# Patient Record
Sex: Male | Born: 1950
Health system: Southern US, Community
[De-identification: ages and names within clinical notes are randomized; demographics above are authoritative.]

## PROBLEM LIST (undated history)

## (undated) DIAGNOSIS — I6930 Unspecified sequelae of cerebral infarction: Secondary | ICD-10-CM

## (undated) DIAGNOSIS — E785 Hyperlipidemia, unspecified: Secondary | ICD-10-CM

## (undated) DIAGNOSIS — Z558 Other problems related to education and literacy: Secondary | ICD-10-CM

## (undated) DIAGNOSIS — R339 Retention of urine, unspecified: Secondary | ICD-10-CM

## (undated) DIAGNOSIS — I421 Obstructive hypertrophic cardiomyopathy: Secondary | ICD-10-CM

## (undated) DIAGNOSIS — I5030 Unspecified diastolic (congestive) heart failure: Secondary | ICD-10-CM

## (undated) DIAGNOSIS — K5790 Diverticulosis of intestine, part unspecified, without perforation or abscess without bleeding: Secondary | ICD-10-CM

## (undated) DIAGNOSIS — I10 Essential (primary) hypertension: Secondary | ICD-10-CM

## (undated) DIAGNOSIS — Z923 Personal history of irradiation: Secondary | ICD-10-CM

## (undated) DIAGNOSIS — I119 Hypertensive heart disease without heart failure: Secondary | ICD-10-CM

## (undated) DIAGNOSIS — N182 Chronic kidney disease, stage 2 (mild): Secondary | ICD-10-CM

## (undated) DIAGNOSIS — Z7901 Long term (current) use of anticoagulants: Secondary | ICD-10-CM

## (undated) DIAGNOSIS — N400 Enlarged prostate without lower urinary tract symptoms: Secondary | ICD-10-CM

## (undated) DIAGNOSIS — I639 Cerebral infarction, unspecified: Secondary | ICD-10-CM

## (undated) DIAGNOSIS — I517 Cardiomegaly: Secondary | ICD-10-CM

## (undated) DIAGNOSIS — C61 Malignant neoplasm of prostate: Secondary | ICD-10-CM

## (undated) DIAGNOSIS — I340 Nonrheumatic mitral (valve) insufficiency: Secondary | ICD-10-CM

## (undated) DIAGNOSIS — D494 Neoplasm of unspecified behavior of bladder: Secondary | ICD-10-CM

## (undated) DIAGNOSIS — R972 Elevated prostate specific antigen [PSA]: Secondary | ICD-10-CM

## (undated) DIAGNOSIS — R9431 Abnormal electrocardiogram [ECG] [EKG]: Secondary | ICD-10-CM

## (undated) HISTORY — DX: Elevated prostate specific antigen (PSA): R97.20

## (undated) HISTORY — DX: Diverticulosis of intestine, part unspecified, without perforation or abscess without bleeding: K57.90

## (undated) HISTORY — DX: Hyperlipidemia, unspecified: E78.5

## (undated) HISTORY — DX: Essential (primary) hypertension: I10

## (undated) HISTORY — PX: CARDIOVASCULAR STRESS TEST: SHX262

## (undated) HISTORY — DX: Cardiomegaly: I51.7

## (undated) HISTORY — DX: Nonrheumatic mitral (valve) insufficiency: I34.0

## (undated) HISTORY — PX: OTHER SURGICAL HISTORY: SHX169

## (undated) HISTORY — DX: Cerebral infarction, unspecified: I63.9

## (undated) HISTORY — PX: TRANSTHORACIC ECHOCARDIOGRAM: SHX275

---
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
## 1997-12-19 ENCOUNTER — Encounter: Admission: RE | Admit: 1997-12-19 | Discharge: 1997-12-19 | Payer: Self-pay | Admitting: Hematology and Oncology

## 1998-12-03 ENCOUNTER — Ambulatory Visit (HOSPITAL_COMMUNITY): Admission: RE | Admit: 1998-12-03 | Discharge: 1998-12-03 | Payer: Self-pay | Admitting: Internal Medicine

## 1998-12-03 ENCOUNTER — Encounter: Payer: Self-pay | Admitting: Internal Medicine

## 1998-12-03 ENCOUNTER — Encounter: Admission: RE | Admit: 1998-12-03 | Discharge: 1998-12-03 | Payer: Self-pay | Admitting: Internal Medicine

## 1999-01-10 ENCOUNTER — Encounter: Admission: RE | Admit: 1999-01-10 | Discharge: 1999-01-10 | Payer: Self-pay | Admitting: Hematology and Oncology

## 1999-07-15 ENCOUNTER — Encounter: Admission: RE | Admit: 1999-07-15 | Discharge: 1999-07-15 | Payer: Self-pay | Admitting: Internal Medicine

## 2000-01-28 ENCOUNTER — Encounter: Admission: RE | Admit: 2000-01-28 | Discharge: 2000-01-28 | Payer: Self-pay | Admitting: Internal Medicine

## 2000-02-18 ENCOUNTER — Encounter: Admission: RE | Admit: 2000-02-18 | Discharge: 2000-02-18 | Payer: Self-pay | Admitting: Internal Medicine

## 2000-06-16 ENCOUNTER — Encounter: Admission: RE | Admit: 2000-06-16 | Discharge: 2000-06-16 | Payer: Self-pay | Admitting: Internal Medicine

## 2000-07-31 ENCOUNTER — Encounter: Admission: RE | Admit: 2000-07-31 | Discharge: 2000-07-31 | Payer: Self-pay

## 2000-08-19 ENCOUNTER — Ambulatory Visit (HOSPITAL_COMMUNITY): Admission: RE | Admit: 2000-08-19 | Discharge: 2000-08-19 | Payer: Self-pay

## 2001-03-04 ENCOUNTER — Encounter: Admission: RE | Admit: 2001-03-04 | Discharge: 2001-03-04 | Payer: Self-pay

## 2002-06-22 ENCOUNTER — Encounter: Admission: RE | Admit: 2002-06-22 | Discharge: 2002-06-22 | Payer: Self-pay | Admitting: Internal Medicine

## 2002-07-06 ENCOUNTER — Ambulatory Visit (HOSPITAL_COMMUNITY): Admission: RE | Admit: 2002-07-06 | Discharge: 2002-07-06 | Payer: Self-pay | Admitting: Internal Medicine

## 2002-07-06 ENCOUNTER — Encounter: Payer: Self-pay | Admitting: Cardiology

## 2003-07-05 ENCOUNTER — Encounter: Admission: RE | Admit: 2003-07-05 | Discharge: 2003-07-05 | Payer: Self-pay | Admitting: Internal Medicine

## 2004-10-21 ENCOUNTER — Ambulatory Visit: Payer: Self-pay | Admitting: Internal Medicine

## 2004-11-14 ENCOUNTER — Encounter (INDEPENDENT_AMBULATORY_CARE_PROVIDER_SITE_OTHER): Payer: Self-pay | Admitting: Cardiology

## 2004-11-14 ENCOUNTER — Ambulatory Visit (HOSPITAL_COMMUNITY): Admission: RE | Admit: 2004-11-14 | Discharge: 2004-11-14 | Payer: Self-pay

## 2005-11-21 ENCOUNTER — Ambulatory Visit: Payer: Self-pay | Admitting: Internal Medicine

## 2005-11-26 ENCOUNTER — Ambulatory Visit: Payer: Self-pay | Admitting: Internal Medicine

## 2005-11-27 ENCOUNTER — Encounter (INDEPENDENT_AMBULATORY_CARE_PROVIDER_SITE_OTHER): Payer: Self-pay | Admitting: Internal Medicine

## 2005-11-27 LAB — CONVERTED CEMR LAB: PSA: 5.98 ng/mL

## 2005-12-04 ENCOUNTER — Encounter (INDEPENDENT_AMBULATORY_CARE_PROVIDER_SITE_OTHER): Payer: Self-pay | Admitting: *Deleted

## 2005-12-04 ENCOUNTER — Ambulatory Visit (HOSPITAL_COMMUNITY): Admission: RE | Admit: 2005-12-04 | Discharge: 2005-12-04 | Payer: Self-pay | Admitting: Internal Medicine

## 2005-12-09 ENCOUNTER — Ambulatory Visit: Payer: Self-pay | Admitting: Gastroenterology

## 2005-12-18 DIAGNOSIS — K5730 Diverticulosis of large intestine without perforation or abscess without bleeding: Secondary | ICD-10-CM | POA: Insufficient documentation

## 2006-01-02 ENCOUNTER — Ambulatory Visit: Payer: Self-pay | Admitting: Internal Medicine

## 2006-01-16 ENCOUNTER — Ambulatory Visit: Payer: Self-pay | Admitting: Gastroenterology

## 2006-01-16 ENCOUNTER — Encounter (INDEPENDENT_AMBULATORY_CARE_PROVIDER_SITE_OTHER): Payer: Self-pay | Admitting: *Deleted

## 2006-02-05 ENCOUNTER — Ambulatory Visit: Payer: Self-pay | Admitting: Internal Medicine

## 2006-04-30 ENCOUNTER — Encounter (INDEPENDENT_AMBULATORY_CARE_PROVIDER_SITE_OTHER): Payer: Self-pay | Admitting: Internal Medicine

## 2006-04-30 DIAGNOSIS — F17200 Nicotine dependence, unspecified, uncomplicated: Secondary | ICD-10-CM

## 2006-04-30 DIAGNOSIS — I10 Essential (primary) hypertension: Secondary | ICD-10-CM | POA: Insufficient documentation

## 2006-04-30 DIAGNOSIS — R972 Elevated prostate specific antigen [PSA]: Secondary | ICD-10-CM | POA: Insufficient documentation

## 2006-04-30 DIAGNOSIS — E785 Hyperlipidemia, unspecified: Secondary | ICD-10-CM | POA: Insufficient documentation

## 2006-06-11 ENCOUNTER — Telehealth: Payer: Self-pay | Admitting: *Deleted

## 2006-09-21 ENCOUNTER — Telehealth (INDEPENDENT_AMBULATORY_CARE_PROVIDER_SITE_OTHER): Payer: Self-pay | Admitting: *Deleted

## 2006-12-15 ENCOUNTER — Telehealth (INDEPENDENT_AMBULATORY_CARE_PROVIDER_SITE_OTHER): Payer: Self-pay | Admitting: *Deleted

## 2006-12-22 ENCOUNTER — Ambulatory Visit: Payer: Self-pay | Admitting: *Deleted

## 2006-12-22 ENCOUNTER — Encounter (INDEPENDENT_AMBULATORY_CARE_PROVIDER_SITE_OTHER): Payer: Self-pay | Admitting: Internal Medicine

## 2006-12-22 LAB — CONVERTED CEMR LAB
Cholesterol, target level: 200 mg/dL
HDL goal, serum: 40 mg/dL
LDL Goal: 100 mg/dL

## 2006-12-23 LAB — CONVERTED CEMR LAB
ALT: 17 units/L (ref 0–53)
AST: 25 units/L (ref 0–37)
Albumin: 4.9 g/dL (ref 3.5–5.2)
Alkaline Phosphatase: 125 units/L — ABNORMAL HIGH (ref 39–117)
BUN: 23 mg/dL (ref 6–23)
CO2: 26 meq/L (ref 19–32)
Calcium: 10 mg/dL (ref 8.4–10.5)
Chloride: 99 meq/L (ref 96–112)
Creatinine, Ser: 1.55 mg/dL — ABNORMAL HIGH (ref 0.40–1.50)
Glucose, Bld: 98 mg/dL (ref 70–99)
PSA: 8.45 ng/mL — ABNORMAL HIGH (ref 0.10–4.00)
Potassium: 4.4 meq/L (ref 3.5–5.3)
Sodium: 139 meq/L (ref 135–145)
Total Bilirubin: 0.9 mg/dL (ref 0.3–1.2)
Total Protein: 8.3 g/dL (ref 6.0–8.3)

## 2006-12-28 ENCOUNTER — Ambulatory Visit: Payer: Self-pay | Admitting: Internal Medicine

## 2006-12-28 ENCOUNTER — Encounter (INDEPENDENT_AMBULATORY_CARE_PROVIDER_SITE_OTHER): Payer: Self-pay | Admitting: Internal Medicine

## 2006-12-28 LAB — CONVERTED CEMR LAB
Cholesterol: 181 mg/dL (ref 0–200)
HDL: 64 mg/dL (ref >39)
LDL Cholesterol: 103 mg/dL — ABNORMAL HIGH (ref 0–99)
Total CHOL/HDL Ratio: 2.8
Triglycerides: 68 mg/dL (ref <150)
VLDL: 14 mg/dL (ref 0–40)

## 2008-03-15 ENCOUNTER — Telehealth: Payer: Self-pay | Admitting: *Deleted

## 2008-06-28 ENCOUNTER — Encounter (INDEPENDENT_AMBULATORY_CARE_PROVIDER_SITE_OTHER): Payer: Self-pay | Admitting: Internal Medicine

## 2008-06-28 ENCOUNTER — Ambulatory Visit: Payer: Self-pay | Admitting: Internal Medicine

## 2008-06-28 LAB — CONVERTED CEMR LAB
BUN: 17 mg/dL (ref 6–23)
CO2: 27 meq/L (ref 19–32)
Calcium: 9.5 mg/dL (ref 8.4–10.5)
Chloride: 107 meq/L (ref 96–112)
Creatinine, Ser: 1.28 mg/dL (ref 0.40–1.50)
Glucose, Bld: 69 mg/dL — ABNORMAL LOW (ref 70–99)
Potassium: 4.1 meq/L (ref 3.5–5.3)
Sodium: 145 meq/L (ref 135–145)

## 2008-07-03 ENCOUNTER — Encounter (INDEPENDENT_AMBULATORY_CARE_PROVIDER_SITE_OTHER): Payer: Self-pay | Admitting: Internal Medicine

## 2008-07-03 ENCOUNTER — Ambulatory Visit: Payer: Self-pay | Admitting: Internal Medicine

## 2008-07-04 LAB — CONVERTED CEMR LAB
Cholesterol: 220 mg/dL — ABNORMAL HIGH (ref 0–200)
HDL: 58 mg/dL (ref >39)
LDL Cholesterol: 149 mg/dL — ABNORMAL HIGH (ref 0–99)
Total CHOL/HDL Ratio: 3.8
Triglycerides: 65 mg/dL (ref <150)
VLDL: 13 mg/dL (ref 0–40)

## 2009-01-29 ENCOUNTER — Telehealth (INDEPENDENT_AMBULATORY_CARE_PROVIDER_SITE_OTHER): Payer: Self-pay | Admitting: Internal Medicine

## 2009-09-03 ENCOUNTER — Ambulatory Visit: Payer: Self-pay | Admitting: Internal Medicine

## 2009-10-04 ENCOUNTER — Ambulatory Visit: Payer: Self-pay | Admitting: Internal Medicine

## 2009-10-05 LAB — CONVERTED CEMR LAB
ALT: 13 units/L (ref 0–53)
AST: 21 units/L (ref 0–37)
Albumin: 4.8 g/dL (ref 3.5–5.2)
Alkaline Phosphatase: 112 units/L (ref 39–117)
BUN: 21 mg/dL (ref 6–23)
CO2: 25 meq/L (ref 19–32)
Calcium: 10.4 mg/dL (ref 8.4–10.5)
Chloride: 102 meq/L (ref 96–112)
Cholesterol: 220 mg/dL — ABNORMAL HIGH (ref 0–200)
Creatinine, Ser: 1.38 mg/dL (ref 0.40–1.50)
Glucose, Bld: 88 mg/dL (ref 70–99)
HDL: 74 mg/dL (ref >39)
LDL Cholesterol: 132 mg/dL — ABNORMAL HIGH (ref 0–99)
PSA: 9.74 ng/mL — ABNORMAL HIGH (ref 0.10–4.00)
Potassium: 4.6 meq/L (ref 3.5–5.3)
Sodium: 141 meq/L (ref 135–145)
Total Bilirubin: 0.7 mg/dL (ref 0.3–1.2)
Total CHOL/HDL Ratio: 3
Total Protein: 7.9 g/dL (ref 6.0–8.3)
Triglycerides: 68 mg/dL (ref <150)
VLDL: 14 mg/dL (ref 0–40)

## 2009-10-09 ENCOUNTER — Encounter (INDEPENDENT_AMBULATORY_CARE_PROVIDER_SITE_OTHER): Payer: Self-pay | Admitting: Internal Medicine

## 2009-10-09 ENCOUNTER — Telehealth: Payer: Self-pay | Admitting: *Deleted

## 2009-10-09 ENCOUNTER — Ambulatory Visit (HOSPITAL_COMMUNITY): Admission: RE | Admit: 2009-10-09 | Discharge: 2009-10-09 | Payer: Self-pay | Admitting: Internal Medicine

## 2009-10-10 DIAGNOSIS — I421 Obstructive hypertrophic cardiomyopathy: Secondary | ICD-10-CM

## 2009-10-23 ENCOUNTER — Ambulatory Visit: Payer: Self-pay | Admitting: Internal Medicine

## 2009-11-14 ENCOUNTER — Encounter: Payer: Self-pay | Admitting: Ophthalmology

## 2009-11-15 ENCOUNTER — Encounter: Payer: Self-pay | Admitting: Cardiology

## 2009-11-19 ENCOUNTER — Ambulatory Visit: Payer: Self-pay | Admitting: Cardiology

## 2009-11-19 DIAGNOSIS — R0989 Other specified symptoms and signs involving the circulatory and respiratory systems: Secondary | ICD-10-CM

## 2009-11-26 ENCOUNTER — Encounter: Payer: Self-pay | Admitting: Ophthalmology

## 2009-12-14 ENCOUNTER — Encounter: Payer: Self-pay | Admitting: Cardiology

## 2009-12-14 ENCOUNTER — Ambulatory Visit: Payer: Self-pay | Admitting: Cardiology

## 2009-12-14 ENCOUNTER — Ambulatory Visit: Payer: Self-pay

## 2009-12-14 ENCOUNTER — Encounter: Payer: Self-pay | Admitting: Cardiovascular Disease

## 2009-12-14 ENCOUNTER — Ambulatory Visit (HOSPITAL_COMMUNITY): Admission: RE | Admit: 2009-12-14 | Discharge: 2009-12-14 | Payer: Self-pay | Admitting: Cardiology

## 2010-01-11 ENCOUNTER — Ambulatory Visit: Payer: Self-pay | Admitting: Cardiology

## 2010-03-25 ENCOUNTER — Ambulatory Visit: Payer: Self-pay | Admitting: Internal Medicine

## 2010-06-11 NOTE — Assessment & Plan Note (Signed)
Summary: EST-1 MONTH F/U VISIT AND LABS/CH   Vital Signs:  Patient profile:   60 year old male Height:      73 inches (185.42 cm) Weight:      118 pounds (54.10 kg) BMI:     15.62 Temp:     97.0 degrees F (36.11 degrees C) oral Pulse rate:   93 / minute BP sitting:   114 / 77  (left arm) Cuff size:   regular  Vitals Entered By: Theotis Barrio NT II (Oct 04, 2009 8:44 AM) CC: ROUTINE OFFICE VISIT / LAB   /  MEDICATION REFILL Is Patient Diabetic? No Pain Assessment Patient in pain? no      Nutritional Status BMI of < 19 = underweight  Have you ever been in a relationship where you felt threatened, hurt or afraid?No   Does patient need assistance? Functional Status Self care Ambulation Normal Comments ROUTINE OFFICE VISIT  / LABS / MEDICATION REFILL   CC:  ROUTINE OFFICE VISIT / LAB   /  MEDICATION REFILL.  History of Present Illness: Ian Hill is a 60 year old Male with PMH/problems as outlined in the EMR, who presents to the [facility] with chief complaint(s) of:    1. HtN: taking his meds as prescribed no new complaints.  2. HL: FLP today. 3. Heart murmur: known to have MR, needs repeat echo for evaluation of progression.  4. Elevated PSA: noted to have elevated PSA on last exam, he was supposed to go see a urologist but never did.   Preventive Screening-Counseling & Management  Alcohol-Tobacco     Smoking Status: current     Smoking Cessation Counseling: yes     Packs/Day: <0.25     Year Started:  ?  Caffeine-Diet-Exercise     Does Patient Exercise: yes     Type of exercise: WALKING     Times/week: 7  Current Medications (verified): 1)  Procardia Xl 90 Mg Tb24 (Nifedipine) .... Take 1 Tablet By Mouth Once A Day 2)  Hydrochlorothiazide 25 Mg Tabs (Hydrochlorothiazide) .... Take 1 Tablet By Mouth Once A Day 3)  Zocor 40 Mg Tabs (Simvastatin) .... Take 1 Tablet By Mouth Once A Day in The Evening. 4)  Anacin 81 Mg  Tbec (Aspirin) .... Take 1 Tablet By Mouth Once  A Day  Allergies (verified): No Known Drug Allergies  Past History:  Past Medical History: Last updated: 04/30/2006 Mitral valve regurgitation Elevated PSA Hyperlipidemia Hypertension Hematochezia Polyuria, polydipsia, noturia Hx of LUL aspergilloma Tobacco abuse, hx of  Risk Factors: Exercise: yes (10/04/2009)  Risk Factors: Smoking Status: current (10/04/2009) Packs/Day: <0.25 (10/04/2009)  Review of Systems       Review of System: Negative except per HPI.    Physical Exam  General:  alert and underweight appearing.   Head:  normocephalic and atraumatic.   Mouth:  pharynx pink and moist.   Neck:  supple.   Lungs:  normal respiratory effort and normal breath sounds.   Heart:  normal rate and regular rhythm.   Abdomen:  soft and non-tender.   Pulses:  normal peripheral pulses  Extremities:  no cyanosis, clubbing or edema  Neurologic:  non focal.  Psych:  Oriented X3 and normally interactive.     Impression & Recommendations:  Problem # 1:  HYPERTENSION (ICD-401.9) Well-controlled. Continue the current regimen.   His updated medication list for this problem includes:    Procardia Xl 90 Mg Tb24 (Nifedipine) .Marland Kitchen... Take 1 tablet by mouth once  a day    Hydrochlorothiazide 25 Mg Tabs (Hydrochlorothiazide) .Marland Kitchen... Take 1 tablet by mouth once a day  Orders: T-Comprehensive Metabolic Panel [id number]) T-Lipid Profile ([id number])  Problem # 2:  DYSLIPIDEMIA (ICD-272.4) FLP today.  His updated medication list for this problem includes:    Zocor 40 Mg Tabs (Simvastatin) .Marland Kitchen... Take 1 tablet by mouth once a day in the evening.  Orders: T-Comprehensive Metabolic Panel [id number]) T-Lipid Profile ([id number])  Problem # 3:  MITRAL REGURGITATION (ICD-424.0) Will evaluate progression with an echo exam.   His updated medication list for this problem includes:    Anacin 81 Mg Tbec (Aspirin) .Marland Kitchen... Take 1 tablet by mouth once a day  Orders: 2 D Echo (2 D  Echo)  Problem # 4:  ELEVATED PROSTATE SPECIFIC ANTIGEN (ICD-790.93) Will get repeat PSA and consider referral to urologist.   Complete Medication List: 1)  Procardia Xl 90 Mg Tb24 (Nifedipine) .... Take 1 tablet by mouth once a day 2)  Hydrochlorothiazide 25 Mg Tabs (Hydrochlorothiazide) .... Take 1 tablet by mouth once a day 3)  Zocor 40 Mg Tabs (Simvastatin) .... Take 1 tablet by mouth once a day in the evening. 4)  Anacin 81 Mg Tbec (Aspirin) .... Take 1 tablet by mouth once a day  Other Orders: T-PSA ([id number])  Patient Instructions: 1)  Please schedule a follow-up appointment in 6 months. 2)  Please come for ultrasound of your heart. 3)  We will let you know if anything wrong with your lab work.   Process Orders Check Orders Results:     [facility]: Order checked:     [id number] -- T-PSA -- ABN required due to diagnosis (CPT: [phone]) Tests Sent for requisitioning (Oct 04, 2009 9:06 AM):     10/04/2009: [facility] -- T-Comprehensive Metabolic Panel [[id number]] (signed)     10/04/2009: [facility] -- T-Lipid Profile [phone] (signed)     10/04/2009: [facility] -- T-PSA [phone] (signed)    Prevention & Chronic Care Immunizations   Influenza vaccine: Fluvax MCR  (06/28/2008)   Influenza vaccine deferral: Deferred  (10/04/2009)    Tetanus booster: Not documented   Td booster deferral: Deferred  (09/03/2009)    Pneumococcal vaccine: Not documented    H. zoster vaccine: Not documented   H. zoster vaccine deferral: Deferred  (09/03/2009)  Colorectal Screening   Hemoccult: Not documented    Colonoscopy: Abnormal  (01/16/2006)   Colonoscopy action/deferral: Repeat colonoscopy in 5 years.   (11/15/2005)  Other Screening   PSA: 8.45  (12/22/2006)   PSA ordered.   Smoking status: current  (10/04/2009)   Smoking cessation counseling: yes  (10/04/2009)  Lipids   Total Cholesterol: 220   (07/03/2008)   LDL: 149  (07/03/2008)   LDL Direct: Not documented   HDL: 58  (07/03/2008)   Triglycerides: 65  (07/03/2008)    SGOT (AST): 25  (12/22/2006)   BMP action: Ordered   SGPT (ALT): 17  (12/22/2006) CMP ordered    Alkaline phosphatase: 125  (12/22/2006)   Total bilirubin: 0.9  (12/22/2006)    Lipid flowsheet reviewed?: Yes   Progress toward LDL goal: Unchanged  Hypertension   Last Blood Pressure: 114 / 77  (10/04/2009)   Serum creatinine: 1.28  (06/28/2008)   BMP action: Ordered   Serum potassium 4.1  (06/28/2008) CMP ordered     Hypertension flowsheet reviewed?: Yes   Progress toward BP goal: At goal  Self-Management Support :   Personal Goals (by the  next clinic visit) :      Personal blood pressure goal: 140/90  (10/04/2009)     Personal LDL goal: 100  (10/04/2009)    Patient will work on the following items until the next clinic visit to reach self-care goals:     Medications and monitoring: take my medicines every day, bring all of my medications to every visit  (09/03/2009)     Eating: drink diet soda or water instead of juice or soda, eat more vegetables, use fresh or frozen vegetables, eat foods that are low in salt, eat baked foods instead of fried foods, eat fruit for snacks and desserts, limit or avoid alcohol  (10/04/2009)     Activity: take a 30 minute walk every day  (10/04/2009)    Hypertension self-management support: Resources for patients handout  (10/04/2009)    Lipid self-management support: Resources for patients handout  (10/04/2009)        Resource handout printed.  Appended Document: Orders Update    Clinical Lists Changes  Orders: Added new Test order of T-PSA [phone]) - Signed      Process Orders Check Orders Results:     [facility]: Check successful Tests Sent for requisitioning (Oct 05, 2009 10:43 AM):     10/04/2009: [facility] -- T-PSA [phone] (signed)

## 2010-06-11 NOTE — Consult Note (Signed)
Summary: [facility]  [facility]   Imported By: Louretta Parma 12/06/2009 16:03:06  _____________________________________________________________________  External Attachment:    Type:   Image     Comment:   External Document

## 2010-06-11 NOTE — Assessment & Plan Note (Signed)
Summary: EST-CK./FU/MEDS/CFB   Vital Signs:  Patient profile:   60 year old male Height:      73 inches (185.42 cm) Weight:      119.03 pounds (54.10 kg) BMI:     15.76 Temp:     98 degrees F (36.67 degrees C) oral Pulse rate:   111 / minute BP sitting:   148 / 90  (left arm)  Vitals Entered By: Angelina Ok RN (September 03, 2009 1:34 PM) CC: Depression Is Patient Diabetic? No Pain Assessment Patient in pain? no      Nutritional Status BMI of < 19 = underweight  Have you ever been in a relationship where you felt threatened, hurt or afraid?No   Does patient need assistance? Functional Status Self care Ambulation Normal Comments Needs refills.  Check up.   CC:  Depression.  History of Present Illness: Ian Hill is a 60 year old Male with PMH/problems as outlined in the EMR, who presents to the [facility] for routine follow up. [name] doing his meds as prescribed and doesn't have any new complaints today.    Depression History:      The patient denies a depressed mood most of the day and a diminished interest in his usual daily activities.         Preventive Screening-Counseling & Management  Alcohol-Tobacco     Smoking Status: current     Smoking Cessation Counseling: yes     Packs/Day: <0.25     Year Started:  ?  Comments: Smokes 1/2 pack per week.  Current Medications (verified): 1)  Procardia Xl 90 Mg Tb24 (Nifedipine) .... Take 1 Tablet By Mouth Once A Day 2)  Hydrochlorothiazide 25 Mg Tabs (Hydrochlorothiazide) .... Take 1 Tablet By Mouth Once A Day 3)  Zocor 40 Mg Tabs (Simvastatin) .... Take 1 Tablet By Mouth Once A Day in The Evening. 4)  Anacin 81 Mg  Tbec (Aspirin) .... Take 1 Tablet By Mouth Once A Day  Allergies (verified): No Known Drug Allergies  Past History:  Past Medical History: Last updated: 04/30/2006 Mitral valve regurgitation Elevated PSA Hyperlipidemia Hypertension Hematochezia Polyuria, polydipsia, noturia Hx of LUL  aspergilloma Tobacco abuse, hx of  Risk Factors: Exercise: yes (12/22/2006)  Risk Factors: Smoking Status: current (09/03/2009) Packs/Day: <0.25 (09/03/2009)  Social History: Packs/Day:  <0.25  Review of Systems       as per hPI  Physical Exam  General:  alert and well-developed.   Lungs:  normal respiratory effort and normal breath sounds.   Heart:  normal rate and regular rhythm.   Abdomen:  soft and non-tender.   Pulses:  normal peripheral pulses  Extremities:  no cyanosis, clubbing or edema  Neurologic:  non focal   Impression & Recommendations:  Problem # 1:  HYPERTENSION (ICD-401.9) Borderline control. I will review him one month if persistently high will make changes to his regimen, eg. add ACEi or beta blocker.  His updated medication list for this problem includes:    Procardia Xl 90 Mg Tb24 (Nifedipine) .Marland Kitchen... Take 1 tablet by mouth once a day    Hydrochlorothiazide 25 Mg Tabs (Hydrochlorothiazide) .Marland Kitchen... Take 1 tablet by mouth once a day  Problem # 2:  DYSLIPIDEMIA (ICD-272.4) Increased zocor last time. Will recheck FLP and review.   His updated medication list for this problem includes:    Zocor 40 Mg Tabs (Simvastatin) .Marland Kitchen... Take 1 tablet by mouth once a day in the evening.  Future Orders: T-Lipid Profile [phone]) .Marland KitchenMarland Kitchen  09/07/2009  Complete Medication List: 1)  Procardia Xl 90 Mg Tb24 (Nifedipine) .... Take 1 tablet by mouth once a day 2)  Hydrochlorothiazide 25 Mg Tabs (Hydrochlorothiazide) .... Take 1 tablet by mouth once a day 3)  Zocor 40 Mg Tabs (Simvastatin) .... Take 1 tablet by mouth once a day in the evening. 4)  Anacin 81 Mg Tbec (Aspirin) .... Take 1 tablet by mouth once a day  Other Orders: Future Orders: T-Comprehensive Metabolic Panel ([id number]) ... 09/07/2009  Patient Instructions: 1)  Please set up a lab appointment for fasting blood work. 2)  Please schedule a follow-up appointment in 1 month.  Prevention & Chronic  Care Immunizations   Influenza vaccine: Fluvax MCR  (06/28/2008)    Tetanus booster: Not documented   Td booster deferral: Deferred  (09/03/2009)    Pneumococcal vaccine: Not documented    H. zoster vaccine: Not documented   H. zoster vaccine deferral: Deferred  (09/03/2009)  Colorectal Screening   Hemoccult: Not documented    Colonoscopy: Abnormal  (01/16/2006)   Colonoscopy action/deferral: Repeat colonoscopy in 5 years.   (11/15/2005)  Other Screening   PSA: 8.45  (12/22/2006)   Smoking status: current  (09/03/2009)   Smoking cessation counseling: yes  (09/03/2009)  Lipids   Total Cholesterol: 220  (07/03/2008)   LDL: 149  (07/03/2008)   LDL Direct: Not documented   HDL: 58  (07/03/2008)   Triglycerides: 65  (07/03/2008)    SGOT (AST): 25  (12/22/2006)   BMP action: Ordered   SGPT (ALT): 17  (12/22/2006) CMP ordered    Alkaline phosphatase: 125  (12/22/2006)   Total bilirubin: 0.9  (12/22/2006)    Lipid flowsheet reviewed?: Yes   Progress toward LDL goal: Unchanged  Hypertension   Last Blood Pressure: 148 / 90  (09/03/2009)   Serum creatinine: 1.28  (06/28/2008)   BMP action: Ordered   Serum potassium 4.1  (06/28/2008) CMP ordered     Hypertension flowsheet reviewed?: Yes   Progress toward BP goal: Improved  Self-Management Support :    Patient will work on the following items until the next clinic visit to reach self-care goals:     Medications and monitoring: take my medicines every day, bring all of my medications to every visit  (09/03/2009)     Eating: drink diet soda or water instead of juice or soda, eat more vegetables, eat foods that are low in salt, eat baked foods instead of fried foods, eat fruit for snacks and desserts, limit or avoid alcohol  (09/03/2009)     Activity: take a 30 minute walk every day, take the stairs instead of the elevator, park at the far end of the parking lot  (09/03/2009)    Hypertension self-management support:  Education handout, Pre-printed educational material, Written self-care plan, Resources for patients handout  (09/03/2009)   Hypertension self-care plan printed.   Hypertension education handout printed    Lipid self-management support: Written self-care plan, Education handout, Pre-printed educational material, Resources for patients handout  (09/03/2009)   Lipid self-care plan printed.   Lipid education handout printed      Resource handout printed.  Process Orders Check Orders Results:     [facility]: Check successful Tests Sent for requisitioning (September 03, 2009 2:13 PM):     09/07/2009: [facility] -- T-Comprehensive Metabolic Panel [[id number]] (signed)     09/07/2009: [facility] -- T-Lipid Profile [phone] (signed)     Vital Signs:  Patient profile:  60 year old male Height:      73 inches (185.42 cm) Weight:      119.03 pounds (54.10 kg) BMI:     15.76 Temp:     98 degrees F (36.67 degrees C) oral Pulse rate:   111 / minute BP sitting:   148 / 90  (left arm)  Vitals Entered By: Angelina Ok RN (September 03, 2009 1:34 PM)

## 2010-06-11 NOTE — Assessment & Plan Note (Signed)
Summary: rov/discuss stress echo/dm   Visit Type:  Follow-up Primary Provider:  Dr.Charles Aundria Rud  CC:  Discuss stress echo.  History of Present Illness: 60 year old male I saw for evaluation of hypertrophic  cardiomyopathy in July of 2011. Echocardiogram in May of 2011 revealed severe asymmetric hypertrophy of the septum. Systolic function was hyperdynamic. Doppler parameters are consistent with abnormal left ventricular relaxation (grade 1 diastolic dysfunction). Doppler parameters are consistent with elevated mean left atrial filling pressure. Mild AI, systolic anterior motion of the MV with moderate regurgitation directed posteriorly. Findings suggest hypertrophic cardiomyopathy without significant obstruction at rest. Carotid Dopplers performed in July of 2011 revealed 0-39% stenosis bilaterally. Abdominal ultrasound showed no aneurysm. Holter monitor showed nonsustained ventricular tachycardia (7 beats). Stress echocardiogram showed possible inferior hypokinesis but systolic blood pressure decreased with exercise. Since then the patient denies any dyspnea on exertion, orthopnea, PND, pedal edema, palpitations, syncope or chest pain.   Current Medications (verified): 1)  Atenolol 25 Mg Tabs (Atenolol) .... Take 1 Tablet By Mouth Two Times A Day 2)  Hydrochlorothiazide 25 Mg Tabs (Hydrochlorothiazide) .... Take 1 Tablet By Mouth Once A Day 3)  Zocor 40 Mg Tabs (Simvastatin) .... Take 1 Tablet By Mouth Once A Day in The Evening.  Allergies (verified): No Known Drug Allergies  Past History:  Past Medical History: Reviewed history from 11/19/2009 and no changes required. Learning disability MITRAL REGURGITATION HYPERTROPHIC OBSTRUCTIVE CARDIOMYOPATHY  HYPERTENSION  DYSLIPIDEMIA  DIASTOLIC DYSFUNCTION  ELEVATED PROSTATE SPECIFIC ANTIGEN  DIVERTICULOSIS, COLON  ASPERGILLOMA   Past Surgical History: Reviewed history from 11/15/2009 and no changes required.  Status post lung surgery  for fungal infection.  Social History: Reviewed history from 11/19/2009 and no changes required. Single  Tobacco Use - Yes.  Alcohol Use - no Drug Use - no  Review of Systems       no fevers or chills, productive cough, hemoptysis, dysphasia, odynophagia, melena, hematochezia, dysuria, hematuria, rash, seizure activity, orthopnea, PND, pedal edema, claudication. Remaining systems are negative.   Vital Signs:  Patient profile:   60 year old male Height:      73 inches Weight:      125 pounds BMI:     16.55 Pulse rate:   76 / minute Pulse rhythm:   regular Resp:     18 per minute BP sitting:   134 / 84  (left arm) Cuff size:   regular  Vitals Entered By: Caralee Ates CMA (January 11, 2010 4:16 PM)  Physical Exam  General:  Well-developed well-nourished in no acute distress.  Skin is warm and dry.  HEENT is normal.  Neck is supple. No thyromegaly.  Chest is clear to auscultation with normal expansion.  Cardiovascular exam is regular rate and rhythm. 2/6 systolic murmur left sternal border. Abdominal exam nontender or distended. No masses palpated. Extremities show no edema. neuro grossly intact    Impression & Recommendations:  Problem # 1:  HYPERTROPHIC OBSTRUCTIVE CARDIOMYOPATHY (ICD-425.1) I have reviewed the patient's studies with him. He has findings making him high risk for sudden death including nonsustained ventricular tachycardia on Holter monitor and decrease systolic blood pressure with exercise. I have explained the risk and had long discussions concerning possible ICD. He and his family would like to discuss this first before making a final decision. They will contact us and we will ask him to see an electrophysiologist if he would like to proceed with ICD. They understand the risk of sudden death. Continue beta blocker. His sister also  states that his family has been screened. His updated medication list for this problem includes:    Atenolol 25 Mg Tabs  (Atenolol) .Marland Kitchen... Take 1 tablet by mouth two times a day    Hydrochlorothiazide 25 Mg Tabs (Hydrochlorothiazide) .Marland Kitchen... Take 1 tablet by mouth once a day  Problem # 2:  HYPERTENSION (ICD-401.9) Blood pressure controlled on present medications. Will continue. His updated medication list for this problem includes:    Atenolol 25 Mg Tabs (Atenolol) .Marland Kitchen... Take 1 tablet by mouth two times a day    Hydrochlorothiazide 25 Mg Tabs (Hydrochlorothiazide) .Marland Kitchen... Take 1 tablet by mouth once a day  Problem # 3:  DYSLIPIDEMIA (ICD-272.4) Continue statin. Managed by primary care. His updated medication list for this problem includes:    Zocor 40 Mg Tabs (Simvastatin) .Marland Kitchen... Take 1 tablet by mouth once a day in the evening.  Problem # 4:  TOBACCO ABUSE (ICD-305.1) Patient counseled on discontinuing.  Patient Instructions: 1)  Your physician recommends that you schedule a follow-up appointment in: 6 MONTHS

## 2010-06-11 NOTE — Procedures (Signed)
Summary: summary report  summary report   Imported By: Mirna Mires 01/02/2010 08:51:17  _____________________________________________________________________  External Attachment:    Type:   Image     Comment:   External Document

## 2010-06-11 NOTE — Assessment & Plan Note (Signed)
Summary: est-ck/fu/meds/cfb   Vital Signs:  Patient profile:   60 year old male Height:      73 inches (185.42 cm) Weight:      121.7 pounds (55.32 kg) BMI:     16.11 Temp:     96.8 degrees F (36 degrees C) oral Pulse rate:   93 / minute BP sitting:   132 / 81  (right arm)  Vitals Entered By: Chinita Pester RN (October 23, 2009 11:01 AM) CC: F/U visit. Med. refills. Wants to start drinking Ensure. Is Patient Diabetic? No Pain Assessment Patient in pain? no      Nutritional Status BMI of < 19 = underweight  Have you ever been in a relationship where you felt threatened, hurt or afraid?Unable to ask;someone w/pt.   Does patient need assistance? Functional Status Self care Ambulation Normal   CC:  F/U visit. Med. refills. Wants to start drinking Ensure.Marland Kitchen  History of Present Illness: Ian Hill is a 60 year old Male with PMH/problems as outlined in the EMR, who presents to the [facility] for followup after an abnormal echo results recently.  1. HOCM: no chest pain, SOB, syncope. In here for card referral.  2. HTN: needs med refills. 3. Low BMI: wants nutritional supplements.   Depression History:      The patient denies a depressed mood most of the day and a diminished interest in his usual daily activities.         Preventive Screening-Counseling & Management  Alcohol-Tobacco     Alcohol drinks/day: 0     Smoking Status: current     Smoking Cessation Counseling: yes     Packs/Day: 0.5     Year Started:  ?  Caffeine-Diet-Exercise     Does Patient Exercise: yes     Type of exercise: WALKING     Times/week: 7  Current Medications (verified): 1)  Atenolol 25 Mg Tabs (Atenolol) .... Take 1 Tablet By Mouth Once A Day 2)  Hydrochlorothiazide 25 Mg Tabs (Hydrochlorothiazide) .... Take 1 Tablet By Mouth Once A Day 3)  Zocor 40 Mg Tabs (Simvastatin) .... Take 1 Tablet By Mouth Once A Day in The Evening. 4)  Anacin 81 Mg  Tbec (Aspirin) .... Take 1 Tablet By Mouth Once A  Day 5)  Ensure  Liqd (Nutritional Supplements) .... 237 Ml, Once Daily  Allergies (verified): No Known Drug Allergies  Past History:  Risk Factors: Alcohol Use: 0 (10/23/2009) Exercise: yes (10/23/2009)  Risk Factors: Smoking Status: current (10/23/2009) Packs/Day: 0.5 (10/23/2009)  Past Medical History: HOCM w/ diastolic dysfxn (echo May 2011) Mitral valve regurgitation Elevated PSA Hyperlipidemia Hypertension Hematochezia Polyuria, polydipsia, noturia Hx of LUL aspergilloma Tobacco abuse, hx of  Social History: Packs/Day:  0.5  Review of Systems      See HPI  Physical Exam  General:  alert and well-developed.   Neck:  supple.   Lungs:  normal respiratory effort and normal breath sounds.   Heart:  normal rate and regular rhythm.  pansystolic murmur parasternal.  Abdomen:  soft and non-tender.   Pulses:  normal peripheral pulses  Extremities:  no cyanosis, clubbing or edema  Neurologic:  non-focal.    Impression & Recommendations:  Problem # 1:  HYPERTROPHIC OBSTRUCTIVE CARDIOMYOPATHY (ICD-425.1) Asymptomatic at the moment. I will switch his nifedipine to atenolol and refer him for card eval.   Problem # 2:  DIASTOLIC DYSFUNCTION (ICD-429.9) Asymptomatic, as above will switch to betablocker and follow card recs.   Problem # 3:  HYPERTENSION (ICD-401.9) Well-controlled. Continue the current regimen.   His updated medication list for this problem includes:    Atenolol 25 Mg Tabs (Atenolol) .Marland Kitchen... Take 1 tablet by mouth once a day    Hydrochlorothiazide 25 Mg Tabs (Hydrochlorothiazide) .Marland Kitchen... Take 1 tablet by mouth once a day  Problem # 4:  DYSLIPIDEMIA (ICD-272.4) Chol: 220 (10/04/2009 7:15:00 PM)HDL:  74 (10/04/2009 7:15:00 PM)LDL:  132 (10/04/2009 7:15:00 PM)Tri:   AST:  21 (10/04/2009 7:15:00 PM) ALT:  13 (10/04/2009 7:15:00 PM)T. Bili:  0.7 (10/04/2009 7:15:00 PM) AP:  112 (10/04/2009 7:15:00 PM)  continue current regimen.  His updated medication list for  this problem includes:    Zocor 40 Mg Tabs (Simvastatin) .Marland Kitchen... Take 1 tablet by mouth once a day in the evening.  Problem # 5:  UNSPECIFIED NUTRITIONAL DEFICIENCY (ICD-269.9) Has low BMI but more or less constant since 2008. Will try nutritional supplements. [name] being seen by urologist for elevated PSA, will follow.   Complete Medication List: 1)  Atenolol 25 Mg Tabs (Atenolol) .... Take 1 tablet by mouth once a day 2)  Hydrochlorothiazide 25 Mg Tabs (Hydrochlorothiazide) .... Take 1 tablet by mouth once a day 3)  Zocor 40 Mg Tabs (Simvastatin) .... Take 1 tablet by mouth once a day in the evening. 4)  Anacin 81 Mg Tbec (Aspirin) .... Take 1 tablet by mouth once a day 5)  Ensure Liqd (Nutritional supplements) .... 237 ml, once daily  Patient Instructions: 1)  Please schedule a follow-up appointment in 2 weeks. 2)  Please stop nifedipine. 3)  New medicine: atenolol, please take once daily.  4)  Please keep up your appointment with cardiologist.  Prescriptions: ZOCOR 40 MG TABS (SIMVASTATIN) Take 1 tablet by mouth once a day in the evening.  #31 x 6   Entered and Authorized by:   Zara Council MD   Signed by:   Zara Council MD on 10/23/2009   Method used:   Print then Give to Patient   RxID:   [id number] HYDROCHLOROTHIAZIDE 25 MG TABS (HYDROCHLOROTHIAZIDE) Take 1 tablet by mouth once a day  #31 x 6   Entered and Authorized by:   Zara Council MD   Signed by:   Zara Council MD on 10/23/2009   Method used:   Print then Give to Patient   RxID:   [id number] ATENOLOL 25 MG TABS (ATENOLOL) Take 1 tablet by mouth once a day  #31 x 6   Entered and Authorized by:   Zara Council MD   Signed by:   Zara Council MD on 10/23/2009   Method used:   Print then Give to Patient   RxID:   [id number] ENSURE  LIQD (NUTRITIONAL SUPPLEMENTS) 237 ml, once daily  #31 x 3   Entered and Authorized by:   Zara Council MD   Signed by:   Zara Council MD on 10/23/2009   Method used:    Print then Give to Patient   RxID:   [id number]   Prevention & Chronic Care Immunizations   Influenza vaccine: Fluvax MCR  (06/28/2008)   Influenza vaccine deferral: Deferred  (10/04/2009)    Tetanus booster: Not documented   Td booster deferral: Deferred  (09/03/2009)    Pneumococcal vaccine: Not documented    H. zoster vaccine: Not documented   H. zoster vaccine deferral: Deferred  (09/03/2009)  Colorectal Screening   Hemoccult: Not documented    Colonoscopy: Abnormal  (01/16/2006)   Colonoscopy action/deferral: Repeat colonoscopy in 5 years.   (  11/15/2005)   Colonoscopy due: 01/17/2011  Other Screening   PSA: 9.74  (10/04/2009)   Smoking status: current  (10/23/2009)   Smoking cessation counseling: yes  (10/23/2009)  Lipids   Total Cholesterol: 220  (10/04/2009)   LDL: 132  (10/04/2009)   LDL Direct: Not documented   HDL: 74  (10/04/2009)   Triglycerides: 68  (10/04/2009)    SGOT (AST): 21  (10/04/2009)   BMP action: Ordered   SGPT (ALT): 13  (10/04/2009)   Alkaline phosphatase: 112  (10/04/2009)   Total bilirubin: 0.7  (10/04/2009)    Lipid flowsheet reviewed?: Yes   Progress toward LDL goal: At goal  Hypertension   Last Blood Pressure: 132 / 81  (10/23/2009)   Serum creatinine: 1.38  (10/04/2009)   BMP action: Ordered   Serum potassium 4.6  (10/04/2009)    Hypertension flowsheet reviewed?: Yes   Progress toward BP goal: At goal  Self-Management Support :   Personal Goals (by the next clinic visit) :      Personal blood pressure goal: 140/90  (10/04/2009)     Personal LDL goal: 100  (10/04/2009)    Patient will work on the following items until the next clinic visit to reach self-care goals:     Medications and monitoring: bring all of my medications to every visit  (10/23/2009)     Eating: use fresh or frozen vegetables, eat foods that are low in salt, eat baked foods instead of fried foods  (10/23/2009)     Activity: take a 30 minute walk  every day  (10/23/2009)    Hypertension self-management support: Written self-care plan  (10/23/2009)   Hypertension self-care plan printed.    Lipid self-management support: Written self-care plan  (10/23/2009)   Lipid self-care plan printed.

## 2010-06-11 NOTE — Procedures (Signed)
Summary: summary report  summary report   Imported By: Mirna Mires 01/15/2010 11:40:25  _____________________________________________________________________  External Attachment:    Type:   Image     Comment:   External Document

## 2010-06-11 NOTE — Assessment & Plan Note (Signed)
Summary: FLU/SB.  Nurse Visit   Allergies: No Known Drug Allergies  Orders Added: 1)  Flu Vaccine 88yrs + MEDICARE PATIENTS [Q2039] 2)  Administration Flu vaccine - MCR [G0008]    Flu Vaccine Consent Questions     Do you have a history of severe allergic reactions to this vaccine? no    Any prior history of allergic reactions to egg and/or gelatin? no    Do you have a sensitivity to the preservative Thimersol? no    Do you have a past history of Guillan-Barre Syndrome? no    Do you currently have an acute febrile illness? no    Have you ever had a severe reaction to latex? no    Vaccine information given and explained to patient? yes    Are you currently pregnant? no    Lot Number:[id number]   Exp Date:11/09/2010   Manufacturer: [employer]    Site Given  Left Deltoid IM Merrie Roof RN  March 25, 2010 9:44 AM

## 2010-06-11 NOTE — Progress Notes (Signed)
  Phone Note Outgoing Call   Call placed by: Theotis Barrio NT II,  Oct 09, 2009 4:09 PM Call placed to: Patient Details for Reason: UROLOGY REFERRAL Summary of Call: SPOKE WITH CARETAKER / Willette Pa.  GAVE HER THE INFO FOR THE APPT WITH THE [facility].  APPT IS FOR JUNE 28TH, AT 2:00PM WITH DR Vonita Moss.  [facility] N[facility] TO THE [facility].Marland Kitchen INSTRUCTED TO TAKE TIME TO RIDE AND FIND THE OFFICE, SO THAT DAY OF APPT , YALL WON'T BE LATE. [place] NT II  Oct 09, 2009 4:12 PM

## 2010-06-11 NOTE — Assessment & Plan Note (Signed)
Summary: NP6/HOCM-MB   Primary Provider:  Dr.Charles Aundria Rud   History of Present Illness: 60 year old male for evaluation of hypertrophic  cardiomyopathy. Echocardiogram in May of 2011 revealed severe asymmetric hypertrophy of the septum. Systolic function was hyperdynamic. Doppler parameters are consistent with abnormal left ventricular relaxation (grade 1 diastolic dysfunction). Doppler parameters are consistent with elevated mean left atrial filling pressure. Mild AI, systolic anterior motion of the MV with moderate regurgitation directed posteriorly. Findings suggest hypertrophic cardiomyopathy without significant obstruction at rest. Patient denies any history of dyspnea on exertion, orthopnea, PND, pedal edema, palpitations, syncope or chest pain. His sister is with him today as he does have some mental disability. There is no history of sudden death in his family.  Preventive Screening-Counseling & Management  Alcohol-Tobacco     Smoking Status: current      Drug Use:  no.    Current Medications (verified): 1)  Atenolol 25 Mg Tabs (Atenolol) .... Take 1 Tablet By Mouth Once A Day 2)  Hydrochlorothiazide 25 Mg Tabs (Hydrochlorothiazide) .... Take 1 Tablet By Mouth Once A Day 3)  Zocor 40 Mg Tabs (Simvastatin) .... Take 1 Tablet By Mouth Once A Day in The Evening. 4)  Ensure  Liqd (Nutritional Supplements) .... 237 Ml, Once Daily 5)  Rapaflo 8 Mg Caps (Silodosin) .Marland Kitchen.. 1 Tab By Mouth Once Daily  Allergies: No Known Drug Allergies  Past History:  Past Medical History: Learning disability MITRAL REGURGITATION HYPERTROPHIC OBSTRUCTIVE CARDIOMYOPATHY  HYPERTENSION  DYSLIPIDEMIA  DIASTOLIC DYSFUNCTION  ELEVATED PROSTATE SPECIFIC ANTIGEN  DIVERTICULOSIS, COLON  ASPERGILLOMA   Past Surgical History: Reviewed history from 11/15/2009 and no changes required.  Status post lung surgery for fungal infection.  Family History: Reviewed history and no changes required. No premature  CAD (father with CABG at 73) No history of sudden death  Social History: Reviewed history and no changes required. Single  Tobacco Use - Yes.  Alcohol Use - no Drug Use - no Drug Use:  no  Review of Systems       no fevers or chills, productive cough, hemoptysis, dysphasia, odynophagia, melena, hematochezia, dysuria, hematuria, rash, seizure activity, orthopnea, PND, pedal edema, claudication. Remaining systems are negative.   Vital Signs:  Patient profile:   60 year old male Height:      73 inches Weight:      189 pounds BMI:     25.03 Pulse rate:   69 / minute Resp:     14 per minute BP sitting:   132 / 84  (left arm)  Vitals Entered By: Kem Parkinson (November 19, 2009 3:45 PM)  Physical Exam  General:  Well developed/well nourished in NAD Skin warm/dry Patient not depressed No peripheral clubbing Back-normal HEENT-normal/normal eyelids; poor dentition Neck supple/normal carotid upstroke bilaterally; bilateral bruits; no JVD; no thyromegaly chest - mild basilar crackles/ normal expansion CV - RRR/normal S1 and S2; no  rubs or gallops;  PMI nondisplaced; 2/6 systolic murmur left sternal border. No change with Valsalva. Abdomen -NT/ND, no HSM, no mass, + bowel sounds, positive bruit 2+ femoral pulses, bilateral bruits Ext-no edema, chords, 2+ DP Neuro-grossly nonfocal     EKG  Procedure date:  11/19/2009  Findings:      Sinus rhythm at a rate of 69. Axis normal. Left ventricular hypertrophy with repolarization abnormality. RV conduction delay. Electrocardiogram concerning for hypertrophic cardiomyopathy.  Impression & Recommendations:  Problem # 1:  HYPERTROPHIC OBSTRUCTIVE CARDIOMYOPATHY (ICD-425.1) Patient has hypertrophic cardiomyopathy. There is no gradient at rest and is  not having symptoms. Increase atenolol to 25 mg p.o. b.i.d. Schedule 24-hour Holter monitor to exclude nonsustained VT. Schedule stress echocardiogram both to exclude ischemia and also to  exclude decrease in systolic blood pressure with exercise. No other risk factors for sudden death. If above abnormal he may need evaluation for ICD. I explained to his sister that his siblings need to be screened for hypertrophic cardiomyopathy. The following medications were removed from the medication list:    Anacin 81 Mg Tbec (Aspirin) .Marland Kitchen... Take 1 tablet by mouth once a day His updated medication list for this problem includes:    Atenolol 25 Mg Tabs (Atenolol) .Marland Kitchen... Take 1 tablet by mouth once a day    Hydrochlorothiazide 25 Mg Tabs (Hydrochlorothiazide) .Marland Kitchen... Take 1 tablet by mouth once a day  Orders: Holter (Holter) Stress Echo (Stress Echo)  Problem # 2:  ABDOMINAL BRUIT (ICD-785.9) Schedule abdominal ultrasound to exclude aneurysm. Orders: Abdominal Aorta Duplex (Abd Aorta Duplex)  Problem # 3:  CAROTID BRUIT (ICD-785.9) Schedule carotid Dopplers. Orders: Carotid Duplex (Carotid Duplex)  Problem # 4:  HYPERTENSION (ICD-401.9) Blood pressure controlled on present medications. Will continue. The following medications were removed from the medication list:    Anacin 81 Mg Tbec (Aspirin) .Marland Kitchen... Take 1 tablet by mouth once a day His updated medication list for this problem includes:    Atenolol 25 Mg Tabs (Atenolol) .Marland Kitchen... Take 1 tablet by mouth two times a day    Hydrochlorothiazide 25 Mg Tabs (Hydrochlorothiazide) .Marland Kitchen... Take 1 tablet by mouth once a day  Problem # 5:  TOBACCO ABUSE (ICD-305.1) Patient counseled on discontinuing.  Problem # 6:  DYSLIPIDEMIA (ICD-272.4) Continue statin. Lipids and liver monitored by primary care. His updated medication list for this problem includes:    Zocor 40 Mg Tabs (Simvastatin) .Marland Kitchen... Take 1 tablet by mouth once a day in the evening.  Problem # 7:  ELEVATED PROSTATE SPECIFIC ANTIGEN (ICD-790.93)  Patient Instructions: 1)  Your physician recommends that you schedule a follow-up appointment in:6 MONTHS 2)  Your physician has recommended  you make the following change in your medication: INCREASE ATENOLOL 25MG  ONE TABLET TWICE DAILY 3)  Your physician has requested that you have a stress echocardiogram. For further information please visit [email or web address]  Please follow instruction sheet as given. 4)  Your physician has recommended that you wear a holter monitor.  Holter monitors are medical devices that record the heart's electrical activity. Doctors most often use these monitors to diagnose arrhythmias. Arrhythmias are problems with the speed or rhythm of the heartbeat. The monitor is a small, portable device. You can wear one while you do your normal daily activities. This is usually used to diagnose what is causing palpitations/syncope (passing out). 5)  Your physician has requested that you have an abdominal aorta duplex. During this test, an ultrasound is used to evaluate the aorta. Allow 30 minutes for this exam. Do not eat after midnight the day before and avoid carbonated beverages. There are no restrictions or special instructions. 6)  Your physician has requested that you have a carotid duplex. This test is an ultrasound of the carotid arteries in your neck. It looks at blood flow through these arteries that supply the brain with blood. Allow one hour for this exam. There are no restrictions or special instructions. 7)  [facility]ING WILL NEED SCREENING FOR HOCM Prescriptions: ATENOLOL 25 MG TABS (ATENOLOL) Take 1 tablet by mouth two times a day  #60 x 12   Entered by:  Deliah Goody, RN   Authorized by:   Ferman Hamming, MD, [facility]   Signed by:   Deliah Goody, RN on 11/19/2009   Method used:   Faxed to ...       [facility] (retail)       [address]       [place], [place]  [address]       Ph: [phone]       Fax: [phone]   RxID:   [id number]

## 2010-06-11 NOTE — Letter (Signed)
[facility]   Imported By: Margie Billet 10/15/2009 11:52:01  _____________________________________________________________________  External Attachment:    Type:   Image     Comment:   External Document

## 2010-06-11 NOTE — Consult Note (Signed)
Summary: [facility]   Imported By: Margie Billet 11/19/2009 09:07:24  _____________________________________________________________________  External Attachment:    Type:   Image     Comment:   External Document

## 2010-06-26 ENCOUNTER — Encounter: Payer: Self-pay | Admitting: Ophthalmology

## 2010-06-26 ENCOUNTER — Ambulatory Visit (INDEPENDENT_AMBULATORY_CARE_PROVIDER_SITE_OTHER): Payer: Medicare Other | Admitting: Ophthalmology

## 2010-06-26 DIAGNOSIS — E785 Hyperlipidemia, unspecified: Secondary | ICD-10-CM

## 2010-06-26 DIAGNOSIS — R972 Elevated prostate specific antigen [PSA]: Secondary | ICD-10-CM

## 2010-06-26 DIAGNOSIS — Z23 Encounter for immunization: Secondary | ICD-10-CM

## 2010-06-26 DIAGNOSIS — I10 Essential (primary) hypertension: Secondary | ICD-10-CM

## 2010-06-26 DIAGNOSIS — Z0000 Encounter for general adult medical examination without abnormal findings: Secondary | ICD-10-CM | POA: Insufficient documentation

## 2010-06-26 DIAGNOSIS — I421 Obstructive hypertrophic cardiomyopathy: Secondary | ICD-10-CM

## 2010-06-26 LAB — BASIC METABOLIC PANEL
CO2: 29 mEq/L (ref 19–32)
Calcium: 9.4 mg/dL (ref 8.4–10.5)
Glucose, Bld: 85 mg/dL (ref 70–99)
Sodium: 141 mEq/L (ref 135–145)

## 2010-06-26 MED ORDER — SIMVASTATIN 40 MG PO TABS: 40.0000 mg | ORAL_TABLET | Freq: Every day | ORAL | Status: DC

## 2010-06-26 MED ORDER — ATENOLOL 25 MG PO TABS: 25.0000 mg | ORAL_TABLET | Freq: Two times a day (BID) | ORAL | Status: DC

## 2010-06-26 MED ORDER — HYDROCHLOROTHIAZIDE 25 MG PO TABS: 25.0000 mg | ORAL_TABLET | Freq: Every day | ORAL | Status: DC

## 2010-06-26 NOTE — Assessment & Plan Note (Signed)
The patient was seen by a line serology back in July and biopsy of the prostate performed. We have requested the results of these biopsies today. The patient's last PSA was performed 10/04/2009 and was 9.74. Because of the patient's high risk of prostate cancer, I will recheck a PSA today.

## 2010-06-26 NOTE — Assessment & Plan Note (Signed)
The patients blood pressure was within reasonable control today (BP: 131/83 mmHg ) and I will not make any adjustments to the patients anti-hypertensive regimen. I will continue to monitor and titrate the patients medications as needed at future visits.   I will refill the  patient's blood pressure medications today.

## 2010-06-26 NOTE — Assessment & Plan Note (Addendum)
Both the patient and his sister are well aware of the risks of sudden death in this patient. But do not wish to move forward with ICD at this point. The sister relates strong spiritual beliefs as motivation behind this decision. The patient states that when he does his time to go he would like to pass away naturally. Because of these decisions , I did discuss the patient's DO NOT RESUSCITATE status today. At this point in time, the patient does not wish to have an ICD , he does wish to be full code. I informed the patient that an ICD would likely lead 2 more rapid resuscitation and better outcome. Both the patient and his sister are aware of this.  At this point,  The patient is taking his atenolol regularly and his heart rate reflects this today. I reiterated the importance of continued daily use of his medications as prescribed.

## 2010-06-26 NOTE — Progress Notes (Signed)
  Subjective:    Patient ID: Ian Hill, male    DOB: [birth year], 60 y.o.   MRN: [id number]  HPI  No concerns only her because of letter.  Sleep well Patient walks most days when not cold.  Diet well.     Review of Systems     Objective:   Physical Exam        Assessment & Plan:

## 2010-06-26 NOTE — Assessment & Plan Note (Signed)
The patient is currently up-to-date on his colonoscopy, as well as his flu vaccination. I will  Administer a tetanus vaccination today.

## 2010-06-26 NOTE — Assessment & Plan Note (Signed)
The patient's last lipid panel was done in May of last year , he is currently on a statin. Plan to recheck the patient's lipid profile at his next visit.

## 2010-06-26 NOTE — Progress Notes (Signed)
  Subjective:    Patient ID: Ian Hill, male    DOB: [birth year], 60 y.o.   MRN: [id number]  HPI  This is a 60 year old male with a past medical history significant for hypertrophic cardiomyopathy , hypertension,  And elevated PSA, who presents for routine followup. Currently, the patient has no complaints. The patient was last seen by Dr. Jens Som in September of last year for his hypertrophic cardiomyopathy. The idea of ICD placement was discussed , but the patient refused this. The patient states that he does not wish to have an ICD because he is very religious and feels that when it is his time to go he would like to pass away  Naturally.  Currently, the patient denies any syncopal events or chest pain. The patient states that he is sleeping and eating well , as well as walking every day that it's not cold.    Review of Systems  Constitutional: Negative for fever and chills.  Respiratory: Negative for cough and shortness of breath.   Cardiovascular: Negative for chest pain and palpitations.  Gastrointestinal: Negative for vomiting, diarrhea and constipation.       Objective:   Physical Exam  Constitutional: He appears well-developed and well-nourished.  HENT:  Head: Normocephalic and atraumatic.  Eyes: Pupils are equal, round, and reactive to light.  Cardiovascular: Normal rate, regular rhythm and intact distal pulses.  Exam reveals no gallop and no friction rub.   Murmur heard.       There is a 3/6 systolic murmur  Pulmonary/Chest: Effort normal and breath sounds normal. He has no wheezes. He has no rales.  Abdominal: Soft. Bowel sounds are normal. He exhibits no distension. There is no tenderness.  Musculoskeletal: Normal range of motion.  Neurological: [name] alert. No cranial nerve deficit.  Skin: No rash noted.         Current Outpatient Prescriptions on File Prior to Visit  Medication Sig Dispense Refill  . atenolol (TENORMIN) 25 MG tablet Take 25 mg by mouth 2  (two) times daily.        . hydrochlorothiazide 25 MG tablet Take 25 mg by mouth daily.        . simvastatin (ZOCOR) 40 MG tablet Take 40 mg by mouth at bedtime.          Past Medical History  Diagnosis Date  . Hypertrophic obstructive cardiomyopathy   . Abdominal bruit   . Carotid bruit   . Mitral regurgitation   . Hypertension   . Dyslipidemia   . Diastolic dysfunction   . Tobacco abuse   . Elevated PSA   . Diverticulosis   . Aspergilloma     Assessment & Plan:

## 2010-06-26 NOTE — Patient Instructions (Signed)
At this point in time, if you or health looks good. I have refilled her medications and you should take these as prescribed. I would like to see you again in 4 months.

## 2010-07-11 ENCOUNTER — Ambulatory Visit: Payer: Self-pay | Admitting: Cardiology

## 2010-08-22 ENCOUNTER — Encounter: Payer: Self-pay | Admitting: Cardiology

## 2010-08-22 ENCOUNTER — Ambulatory Visit (INDEPENDENT_AMBULATORY_CARE_PROVIDER_SITE_OTHER): Payer: Medicare Other | Admitting: Cardiology

## 2010-08-22 DIAGNOSIS — I10 Essential (primary) hypertension: Secondary | ICD-10-CM

## 2010-08-22 DIAGNOSIS — I421 Obstructive hypertrophic cardiomyopathy: Secondary | ICD-10-CM

## 2010-08-22 DIAGNOSIS — F17200 Nicotine dependence, unspecified, uncomplicated: Secondary | ICD-10-CM

## 2010-08-22 DIAGNOSIS — E785 Hyperlipidemia, unspecified: Secondary | ICD-10-CM

## 2010-08-22 NOTE — Assessment & Plan Note (Signed)
Blood pressure elevated. We will follow this and add additional medications as needed. Beta blocker could also be increased in the future.

## 2010-08-22 NOTE — Assessment & Plan Note (Signed)
No symptoms. Continue beta blocker. Risk factors for sudden cardiac death. We have discussed ICD and they are not interested in pursuing this. They understand the risk of sudden death.

## 2010-08-22 NOTE — Assessment & Plan Note (Signed)
Patient counseled on discontinuing. 

## 2010-08-22 NOTE — Patient Instructions (Signed)
Your physician recommends that you schedule a follow-up appointment in: ONE YEAR 

## 2010-08-22 NOTE — Assessment & Plan Note (Signed)
Management per primary care. 

## 2010-08-22 NOTE — Progress Notes (Signed)
HPI: 60 year old male for FU of hypertrophic  cardiomyopathy. Abdominal ultrasound in August of 2011 showed no aneurysm. Carotid Dopplers showed 0-39% bilateral stenosis. Echocardiogram in May of 2011 revealed severe asymmetric hypertrophy of the septum. Systolic function was hyperdynamic. Doppler parameters are consistent with abnormal left ventricular relaxation (grade 1 diastolic dysfunction). Doppler parameters are consistent with elevated mean left atrial filling pressure. Mild AI, systolic anterior motion of the MV with moderate regurgitation directed posteriorly. Findings suggest hypertrophic cardiomyopathy without significant obstruction at rest. Holter monitor showed nonsustained ventricular tachycardia (7 beats). Stress echocardiogram showed possible inferior hypokinesis but systolic blood pressure decreased with exercise. We discussed referral for ICD and the family stated they would consider. Since I last saw him in September of 2011, the patient denies any dyspnea on exertion, orthopnea, PND, pedal edema, palpitations, syncope or chest pain.    Current Outpatient Prescriptions  Medication Sig Dispense Refill  . atenolol (TENORMIN) 25 MG tablet Take 1 tablet (25 mg total) by mouth 2 (two) times daily.  60 tablet  6  . hydrochlorothiazide 25 MG tablet Take 1 tablet (25 mg total) by mouth daily.  30 tablet  6  . simvastatin (ZOCOR) 40 MG tablet Take 1 tablet (40 mg total) by mouth at bedtime.  30 tablet  6     Past Medical History  Diagnosis Date  . Hypertrophic obstructive cardiomyopathy   . Mitral regurgitation   . Hypertension   . Dyslipidemia   . Diastolic dysfunction   . Elevated PSA   . Diverticulosis   . Aspergilloma     No past surgical history on file.  History   Social History  . Marital Status: Single    Spouse Name: N/A    Number of Children: N/A  . Years of Education: N/A   Occupational History  . Not on file.   Social History Main Topics  . Smoking  status: Current Some Day Smoker    Types: Cigarettes  . Smokeless tobacco: Not on file  . Alcohol Use: No  . Drug Use: No  . Sexually Active: Not on file   Other Topics Concern  . Not on file   Social History Narrative  . No narrative on file    ROS: no fevers or chills, productive cough, hemoptysis, dysphasia, odynophagia, melena, hematochezia, dysuria, hematuria, rash, seizure activity, orthopnea, PND, pedal edema, claudication. Remaining systems are negative.  Physical Exam: Well-developed well-nourished in no acute distress.  Skin is warm and dry.  HEENT is normal.  Neck is supple. No thyromegaly.  Chest is clear to auscultation with normal expansion.  Cardiovascular exam is regular rate and rhythm. 2/6 systolic murmur Abdominal exam nontender or distended. No masses palpated. Extremities show no edema. neuro grossly intact  ECG Sinus rhythm at a rate of 67. Left ventricular hypertrophy with repolarization.

## 2010-09-27 NOTE — Assessment & Plan Note (Signed)
[facility]                           GASTROENTEROLOGY OFFICE NOTE   DUBLIN, GRAYER                    MRN:          [id number]  DATE:12/09/2005                            DOB:          Oct 22, 1950    REFERRING PHYSICIAN:  Dennis Bast, M.D.   REASON FOR REFERRAL:  Colorectal cancer screening.   HISTORY OF PRESENT ILLNESS:  Ian Hill is a 60 year old African American  male who is here with his sister.  He recently had a physical examination  and was recommended to undergo screening colonoscopy.  He denies any  colorectal complaints and specifically denies weight loss, abdominal pain,  rectal pain, change in bowel habits, change in stool caliber, melena or  hematochezia.  There is no family history of colon cancer, colon polyps or  inflammatory bowel disease.  Blood work from November 26, 2005, revealed a  normal CBC and CMET except for a slightly low glucose at 66.  PSA was  elevated at 5.98.   PAST MEDICAL HISTORY:  1.  Hypertension.  2.  Heart murmur.  3.  Status post lung surgery for fungal infection.   CURRENT MEDICATION:  Nifedipine ER 90 mg daily.   ALLERGIES:  NO KNOWN DRUG ALLERGIES.   SOCIAL HISTORY:  See handwritten evaluation form.   REVIEW OF SYSTEMS:  See handwritten evaluation form.   PHYSICAL EXAMINATION:  GENERAL APPEARANCE:  A thin black male in no acute  distress.  His sister supplies much of the history.  VITAL SIGNS:  Height 6 feet, weight 124.2 pounds, blood pressure 134/78,  pulse 98 and regular.  HEENT:  Sclerae are anicteric.  Oropharynx clear.  CHEST:  Clear to auscultation bilaterally.  CARDIOVASCULAR:  Regular rate and rhythm with a harsh systolic murmur.  ABDOMEN:  Soft, nontender, nontender, normal active bowel sounds, no  palpable organomegaly, masses or hernias.  RECTAL:  Exam deferred to time of colonoscopy.  EXTREMITIES:  Without clubbing, cyanosis, or edema.  NEUROLOGIC:  Alert and oriented x3.   Grossly nonfocal.   ASSESSMENT/PLAN:  1.  Average risk for colorectal cancer.  Rule out colorectal neoplasms.      Risks, benefits, and alternatives to colonoscopy with possible biopsy      and possible polypectomy discussed with the patient and his sister.  The      patient consents to proceed.  The sister has previously been through      colonoscopy and understands the preparation and procedure quite well.      Further work-up per Dr. Luiz Iron.  2.  Heart murmur.  Further evaluation per Dr. Luiz Iron.                                   Venita Lick. Pleas Koch., MD, [facility]   MTS/MedQ  DD:  12/09/2005  DT:  12/09/2005  Job #:  [id number]   cc:   Dennis Bast, MD

## 2010-10-09 ENCOUNTER — Encounter: Payer: Medicare Other | Admitting: Ophthalmology

## 2010-10-16 ENCOUNTER — Encounter: Payer: Medicare Other | Admitting: Ophthalmology

## 2010-11-14 ENCOUNTER — Encounter: Payer: Self-pay | Admitting: Internal Medicine

## 2010-11-14 ENCOUNTER — Ambulatory Visit (INDEPENDENT_AMBULATORY_CARE_PROVIDER_SITE_OTHER): Payer: Medicare Other | Admitting: Internal Medicine

## 2010-11-14 DIAGNOSIS — Z0000 Encounter for general adult medical examination without abnormal findings: Secondary | ICD-10-CM

## 2010-11-14 DIAGNOSIS — R972 Elevated prostate specific antigen [PSA]: Secondary | ICD-10-CM

## 2010-11-14 DIAGNOSIS — E785 Hyperlipidemia, unspecified: Secondary | ICD-10-CM

## 2010-11-14 DIAGNOSIS — F17200 Nicotine dependence, unspecified, uncomplicated: Secondary | ICD-10-CM

## 2010-11-14 DIAGNOSIS — I421 Obstructive hypertrophic cardiomyopathy: Secondary | ICD-10-CM

## 2010-11-14 DIAGNOSIS — I10 Essential (primary) hypertension: Secondary | ICD-10-CM

## 2010-11-14 LAB — LIPID PANEL: LDL Cholesterol: 100 mg/dL — ABNORMAL HIGH (ref 0–99)

## 2010-11-14 MED ORDER — HYDROCHLOROTHIAZIDE 25 MG PO TABS: 25.0000 mg | ORAL_TABLET | Freq: Every day | ORAL | Status: DC

## 2010-11-14 MED ORDER — ATENOLOL 25 MG PO TABS: 25.0000 mg | ORAL_TABLET | Freq: Two times a day (BID) | ORAL | Status: DC

## 2010-11-14 MED ORDER — SIMVASTATIN 40 MG PO TABS: 40.0000 mg | ORAL_TABLET | Freq: Every day | ORAL | Status: DC

## 2010-11-14 MED ORDER — ZOSTER VACCINE LIVE 19400 UNT/0.65ML ~~LOC~~ SOLR: 0.6500 mL | Freq: Once | SUBCUTANEOUS | Status: DC

## 2010-11-14 NOTE — Assessment & Plan Note (Signed)
PT is still smoking and is not ready to quit

## 2010-11-14 NOTE — Assessment & Plan Note (Signed)
We discussed ICD placement, and the patient continues to decline intervention at this time.  The importance of medication adherence was discussed with the patient.

## 2010-11-14 NOTE — Assessment & Plan Note (Signed)
The patient's LDL goal is <130 (previous LDL value = 132) -lipid panel drawn today, will follow-up -continue statin treatment

## 2010-11-14 NOTE — Assessment & Plan Note (Signed)
-  patient given prescription for zostavax

## 2010-11-14 NOTE — Progress Notes (Signed)
Follow-up Visit, Intern Note  HPI: The patient is a 60 yo man, with a history of HTN, HL, and hypertrophic cardiomyopathy, presenting for his 66-month follow-up.  He reports no new symptoms since his last visit, and notes that he has been taking his medications regularly.  He has been trying to quit smoking, and has cut back his tobacco usage substantially, but still smokes occasionally.  He continues to express the desire not to have an ICD implanted, citing that "when his time comes" he would prefer to "die a natural death", but he still wishes to be considered Full Code.  We discussed at length the costs/benefits of continued PSA testing in light of his previously elevated PSA levels and previous prostate biopsy.  At this time, if his PSA levels started to rise, he believes he would not want to pursue invasive surgical options for prostate cancer.  As such, he has decided that he does not want to continue monitoring his PSA levels at this time, due to his unwillingness to act on positive results.  We discussed that he can change his mind about this decision in the future, and restart monitoring his PSA levels if his values change.  ROS: General: No fevers, chills, weight changes, appetite changes HEENT: No changes in vision, changes in hearing, sore throat Neck: No swelling, nodules CV: No chest pain, palpitations, SOB Pulm: No dyspnea, wheezing, coughing Abd: No abdominal pain, nausea/vomiting, diarrhea/constipation Ext: No arthralgias, myalgias Neuro: No numbness, burning, or tingling   Filed Vitals:   11/14/10 1520  BP: 134/80  Pulse: 67  Temp: 97.6 F (36.4 C)    PE: General: Alert, cooperative, in no apparent distress HEENT: Pupils equal, round, and reactive to light, extraocular movements intact, oropharynx clear and non-erythematous Neck: Supple, no lymphadenopathy CV: Grade II/VI systolic murmur (chronic), regular rate and rhythm Pulm: Clear to ascultation bilaterally, no  wheezes, rales, or ronchi Abd: Soft, non-tender, non-distended, bowel sounds present Ext: No cyanosis, clubbing, or edema Neuro: A&Ox3, CN II-XII grossly intact, sensation intact, strength intact   Assessment/Plan

## 2010-11-14 NOTE — Progress Notes (Signed)
I saw and discussed Mr Stahl with Dr Manson Passey and I agree with his note as outlined here.

## 2010-11-14 NOTE — Assessment & Plan Note (Signed)
Discussed the costs/benefits of PSA testing at length with the patient and his sister.  At this time, they believe that if they received an elevated PSA level, they would decline further invasive biopsy or treatment options, so they have decided to stop PSA testing at this time.  The patient and his sister were sent home with information about prostate cancer and PSA testing, and were told that we could revisit this issue at their next appointment, and restart PSA testing at any time if they changed their mind.

## 2010-11-14 NOTE — Patient Instructions (Signed)
Please return in 6 months for your routine follow-up appointment. You will have labs drawn today to check your cholesterol, as well as your kidney and liver function.  We will call you with any abnormal results. As discussed at today's visit, please review the following information about Prostate Cancer and the PSA test.  If you decide you would like to resume PSA testing in the future, we can do so.  Cancer of the Prostate The prostate is a male gland that is involved in the production of semen. It is located between the bladder and the rectum. The normal prostate gland is the size of a walnut and surrounds the tube that carries urine from the bladder (urethra). Prostate cancer is the abnormal growth of cells in the gland. Next to skin cancer, prostatic cancer is the most common cancer in men. One out of 6 men will be diagnosed with this in their lifetime. Although the number with this disease is large, the majority of men diagnosed with prostate cancer do not die from it.  CAUSES Age is the biggest risk factor for this disease. Growing older increases the chances of developing prostate cancer. The second most common risk factor is genetics. That means if a man has a father or brother with prostate cancer then he has twice the risk of developing prostate cancer. Estimations are that 5 to 10 percent of all prostate cancer cases are hereditary.  SYMPTOMS Most older men suffer from an enlarged prostate gland called BPH. Because prostate cancer occurs in this population the symptoms that a man has with prostate cancer are almost always secondary to the enlarged prostate gland and not directly caused by the cancer. The symptoms listed below (except for the pain in the back hips and thighs) are usually caused by the enlarged prostate gland and do not indicate that you have cancer of the prostate. Prostate cancer is usually found because the doctor is evaluating symptoms below or because a blood test indicates  prostate cancer without the symptoms.  Frequent urination.   Difficulty starting or stopping urination.   Inability to urinate.   Weak or interrupted flow of urine.   Painful or burning urination.   Painful ejaculation.   Blood in urine or semen.   Persistent pain in lower back, hips, upper thighs.  Symptoms actually caused by the cancer only occur in extremely advanced cases. Therefore, regular exams are especially important. DIAGNOSIS  Screening During the last few years screening for prostate cancer by PSA testing has become somewhat controversial.  This has created confusion not only for the general public but for some groups of physicians as well.  The American Cancer Society (ACS) found that an annual testing of PSA can lead to unnecessary treatments that do more harm than good. Mass screening of PSA at health fairs is falling out of favor. However, the American Urologic Association (AUA) believes that the ACS may not be placing enough emphasis on individual results, and that testing for PSA can be part of understanding prostate cancer risk when all factors are taken into consideration. Because of newer testing methods, prostate cancer is being diagnosed at earlier stages in the disease. Several types of screening tests are available:   Prostate Specific Antigen (PSA)   Total PSA   Free PSA - PSA that is not attached to a protein in the blood. The higher the percentage of free PSA, the lower the risk of cancer.   PSA velocity - how fast the PSA changes.  PSA density - how much PSA is present compared to the size of the prostate gland.   Digital rectal examination (DRE). Your caregiver slides a gloved, lubricated finger into the rectum and feels the part of the prostate that lies next to it. The ACS also recently recommended that a DRE is not recommended as a routine screening test for cancer of the prostate.   Transrectal ultrasound (a test that uses low volume, high  frequency sound waves to produce an electrical "picture" of internal organs on a screen). For prostate screening, the device used to produce the sound waves must be placed inside the rectum so it is close to the prostate gland. This test is not usually painful.  Testing If there is concern about prostate cancer, your caregiver may ask for tests in addition to those listed above. These could include:  X-rays.   CT scan.   MRI.   Blood tests other than PSA.   Urine tests.  The only way to make a final diagnosis of prostatic cancer is to get a tissue sample (perform a biopsy). A biopsy is done by putting a fine needle into the prostate. It is guided into position by an ultrasound probe that has been placed into the rectum. Usually 8 to 12 samples are taken samples are taken. This test is not usually done with any anesthesia. This test is not particularly painful. Most people who undergo a biopsy find that the first few biopsies are hardly felt at all. As the number of biopsy specimens are increased a slight burning sensation is described. The specimens are then sent to a pathologist. A pathologist is a specialist who looks at tissues and cells. This expert will look at the sample under a microscope to determine if the tissue is cancerous. The cancer is also given a Gleason score by the pathologist. This is a system of grading prostate cancer tissue based on how it looks under a microscope. The score indicates how likely it is that a tumor will spread. The pathologist looks for two patterns and gives a number to each pattern. The first pattern represents the majority of the tumor. The second pattern represents the pattern of the smaller amount of the tumor. When you look at the report you will see a number such as Gleason 3+4. These are the two patterns that are added together to give the total score. In this example the total Gleason scores is 7. This means that the tumor is mostly low-grade with some  high-grade tumor. A low total Gleason score means the cancer tissue looks more like normal prostate tissue and the tumor is less likely to spread. A high Gleason score means the cancer tissue is very different from normal. This high score means the tumor is more likely to spread. STAGING If a diagnosis of cancer has been established by the biopsy, the next step is to stage the cancer. This means it is put in a category based on how far the cancer has spread. This is important for helping your caregivers plan appropriate treatment. Accurate staging may require one or more of the following tests: Pre-surgery staging tests (Tests done with image machines)  Radionuclide bone scan: A test to detect any cancer cells in bone.   MRI (magnetic resonance imaging): An imaging test that uses a magnet, radio waves, and a computer to make a series of detailed pictures of areas inside the body.   CT scan (CAT scan): A test that uses a computer  linked to an x-ray machine to make a series of detailed pictures of areas inside the body, taken from different angles.   ProstaScint Scan - A test to see if the cancer has spread to other non bone tissues.  Tests that require a procedure:  Pelvic lymphadenectomy: This is a lymph node biopsy. This biopsy is performed during an open surgical procedure. Most of the time it is done as a routine part of the surgery to remove the prostate.   Laparoscopic Biopsy - in this case the surgeon makes little tiny holes in the abdominal wall and places long thin instruments with a camera into the area of the lymph nodes to remove some of them.   Fine needle aspiration - a radiology doctor with special training may insert a long needle through the skin and take a small sample of a lymph node for pathology examination.  The following are the different stages of prostate cancer: Stage I -- Cancer is found in the prostate only. It cannot be felt during a DRE and is not visible by imaging.  It is usually found accidentally, such as during surgery for other prostate problems. The Gleason score is low.  Stage II -- Cancer is more advanced than in stage I but has not spread outside the prostate.  Stage III -- Cancer has spread beyond the outer layer of the prostate to nearby tissues. Cancer may be found in the seminal vesicles.  Stage IV  -- Cancer has spread (metastasized) to lymph nodes near or far from the prostate or to other parts of the body. The cancer may have spread to the bladder, rectum, bones, liver, or lungs. Metastatic prostate cancer often spreads to the bones.  TREATMENT Treatments such as surgery, medications, and radiation may be recommended based on the stage of the cancer and Gleason score. If a cure is not likely, your caregiver may prescribe hormonal treatment or external beam radiation. These can slow the growth of cancer. Your caregiver will help you with these decisions. Unfortunately, as with all cancers, there is nothing simple about the treatment. Many times, it becomes a judgment call that is based upon the most knowledgeable guess. Once cancer of the prostate has been diagnosed, your caregiver will discuss your treatment with you. Your caregiver will help you decide the best course of treatment. Treatment often depends on your age, health, and other risk factors. The more common methods of treatment are:  Watchful Waiting: Active Surveillance -- In this type of treatment a decision for treatments such as radiation or surgical treatment is temporarily or perhaps permanently deferred. This decision depends upon the prostate cancer progression and other factors such as: age, personal preferences, and other disease problems. The disease is followed by routine monitoring with PSA tests, digital rectal examinations, possible biopsies and perhaps imaging examinations to include MRIs bone scans CT scans and ultrasounds. The risk is primarily that the time of best opportunity  for treating cancer may pass.   Open surgical -- This involves an operation to remove the prostate. It is accomplished through several different incisions. The most common approach is a horizontal incision just above the pubic hairline. Another less commonly used is through the skin between the anus and scrotum. Your caregiver will discuss which procedure may be best for you. Possible common side effects of surgical treatment are:   Impotence.   Problems with control of urination.   Laparoscopic prostatectomy -- 4 to 5 small incisions (1/2 inch) are made in  the skin of the abdomen and tiny instruments including tiny cameras are inserted to remove the prostate and lymph nodes.   Chemical engineer Prostatectomy -- The prostate and lymph nodes are removed by surgical instruments that are inserted through tiny incisions in the abdomen wall and connected to a three-dimensional computer to allow the surgeon to manipulate the robotic arms. Magnification allows the surgeon to better perform nerve sparing procedures. This decreases the chance of urine-leaking problems after the surgery.   Orchiectomy -- this is the removal of the testicles which is done to reduce the male hormone testosterone. This hormone is known to stimulate the growth of prostate cancer. This treatment is used less often now. Occasionally silicone prostheses are placed in the scrotum with this treatment.   Radiation, External Beam -- This form of radiation aims beams of radiation from outside the body at the prostate. It is often as successful as surgery. It has the advantage of being more gentle than surgery. It is more useful for elderly and those in frail health. The disadvantages are that it is not as quick as surgery. It also causes some damage to surrounding tissue. For example, it can cause inflammation of the bladder and rectal areas. Possible side effects are bowel problems such as diarrhea, stool leakage, and blood in the stool.  These problems usually but not always go away in time. Bladder problems including urgency and frequency and occasional leakage of urine. Impotence is also possible. Occasionally fluid buildup in the legs or in the genitals occurs.   Radiation, Internal -- (brachytherapy) This form of radiation differs from the external beam. Tiny radioactive needles, seeds (in the form of pellets- 40 to100), wires, or catheters are implanted directly into the prostate gland. This is accomplished by ultrasound guidance. It usually requires anesthesia and a combined effort by a radiation oncologist and a urologist. This treatment may not be possible in men with large prostate glands or a prostate gland that has been trimmed down by previous surgery. It can deliver a higher dose of radiation in a localized area. This treatment causes little damage to surrounding tissues. Its usual complications include urgency of urination and frequency of urination. These complications generally subside after about two weeks. Bowel problems urinary problems and impotence may occur but are less likely to develop then the external beam radiation treatment.   High-intensity Focused Ultrasound -- This treatment uses a type of high-energy sound waves (ultrasound) to destroy cancer cells. To treat prostate cancer, a device is placed inside the rectum and near the prostate (endorectal probe). This probe is used to make the sound waves.   Cryosurgery -- This is a treatment that uses an instrument to freeze and destroy prostate cancer cells. This procedure is not widely used because of a high-risk of damage to the rectum and urethra tube. In addition there is a significant risk of impotence that is higher than many other treatments.   Chemotherapy -- Cancer treatment that uses drugs that stop the growth of cancer cells either by killing them or by stopping them from multiplying. It may be used in advanced cancer of the prostate if the disease has  spread to other parts of the body. Side effects depend on which chemical is used. Traditionally side effects include:   Nausea.   Vomiting.   Hair loss.   Sores in the mouth.   Hormone Therapy -- (a form of chemotherapy using hormones) In prostate cancer, male sex hormones can cause prostate cancer  to grow. This treatment removes hormones or blocks their action and can stop cancer cells from growing. Hormones are substances produced by glands in the body and circulated in the bloodstream. Some side effects include:   Loss of sex drive.   Weakened bones.   Weight gain.   Heart disease.   Hot flashes.   Loss of muscle mass.   Erectile dysfunction.   Fatigue.   Loss of calcium in the bones (osteoporosis).  SUMMARY OF CONSIDERATIONS. Prostate cancer diagnosis and treatment is a complicated topic. The controversy over whether or not to get a PSA blood test and what to do if the blood test is positive requires an intense consultation with a specialist who treats prostate cancer. Many questions need to be answered and therefore need to be asked. The topics are more likely to be covered by a physician who routinely takes care of patients with prostate cancer. Such questions as how important is my age, how important are my activities in both my personal and professional life, how much time do I have to make a decision for either getting a PSA test, getting a prostate biopsy, and getting definitive therapy if I have cancer. How likely is my cancer to spread, where the risks and benefits of each possible treatment, how long does each treatment take, what is the expense of each treatment, how much of that expenses covered by an insurance policy, and do I need to get a second opinion? Is my PSA test not being performed because of a general recommendation for the population to save money and to decrease possible side effects or is it being done because my individual circumstances have been totally  taken into consideration and in fact I do not need a PSA test at this time? Am I satisfied that all of my questions have been answered based upon complete knowledge by my doctor and by the time spent in providing those answers? Do I understand everything that has been told to me? What about my family and how does my decision to either have or not have a PSA blood test, biopsy, and our other major treatment affect them? Have I been talked into or talked out of having a PSA blood test or biopsy? Am I totally comfortable with my doctor? Am I totally comfortable with whatever outcome occurs if I do or do not have a PSA blood test or biopsy? HOME CARE INSTRUCTIONS Care at home will depend upon your specific symptoms and the course of treatment agreed upon between you and your caregiver. There are many ways that the prostate cancer can affect someone. Patients recover in home environments that vary widely. There are also many helpful resources available for each patient. For these reasons, home care must and can be customized for each individual patient. Document Released: 04/28/2005 Document Re-Released: 07/23/2009 [facility] Patient Information 2011 [place], [place].   Prostate-Specific Antigen (PSA)   The PSA is a blood test. It is used to help detect early forms of prostate cancer. The test is usually used along with other tests. The test is also used to follow the course of those who already have prostate cancer or who have been treated for prostate cancer.   Some factors interfere with the results of the PSA. The factors listed below will either increase or decrease the PSA levels. They are:  Prescriptions used for male baldness.   Some herbs.  Active prostate infection.  Prior instrumentation or Foley catheterization.  Ejaculation up to two  days prior to testing.  A non-cancerous enlargement of the prostate.  Inflammation of the prostate.  Active urinary tract infection.  If your test  results are elevated, your caregiver will discuss the results with you. They will also let you know if more evaluation is needed.   PREPARATION FOR TEST: No preparation or fasting is necessary.   NORMAL FINDINGS: Less than 4 ng/mL or Less than 62mcg/L (SI units)   MEANING OF TEST A normal value means prostate cancer is less likely. As values go up in between 4.0 ng/ml and 10 ng/ml, the chance of having prostate cancer increases. However, further testing will be needed. Values above 10 ng/ml indicate that there is a much higher chance of having prostate cancer (if the above situations that raise PSA are not present).   Your caregiver will go over your test results with you and discuss the importance of this test. If this value is elevated, your caregiver may recommend further testing or evaluation.   OBTAINING THE TEST RESULTS  Make sure you receive the results of your test. Ask how these results are to be obtained if you have not been informed.   Your caregiver will provide further instructions or treatment options if necessary.   FOR MORE INFORMATION, VISIT: www.labtestsonline.org   Document Released: 05/20/2009   [facility] Patient Information 2011 [place], [place].

## 2010-11-15 ENCOUNTER — Encounter: Payer: Self-pay | Admitting: Internal Medicine

## 2010-11-15 LAB — COMPLETE METABOLIC PANEL WITH GFR
ALT: 15 U/L (ref 0–53)
BUN: 20 mg/dL (ref 6–23)
CO2: 29 mEq/L (ref 19–32)
Calcium: 9.9 mg/dL (ref 8.4–10.5)
Chloride: 102 mEq/L (ref 96–112)
Creat: 1.33 mg/dL (ref 0.50–1.35)
GFR, Est African American: >60 mL/min (ref >60)

## 2010-11-15 NOTE — Progress Notes (Signed)
Results from lab tests 11/14/10 received.  Cr is stable at 1.33.  LDL has decreased significantly to 100.  Patient to continue on current medication regimen.

## 2011-02-25 ENCOUNTER — Other Ambulatory Visit (INDEPENDENT_AMBULATORY_CARE_PROVIDER_SITE_OTHER): Payer: Medicare Other | Admitting: *Deleted

## 2011-02-25 DIAGNOSIS — Z23 Encounter for immunization: Secondary | ICD-10-CM

## 2011-06-05 ENCOUNTER — Other Ambulatory Visit: Payer: Self-pay | Admitting: Internal Medicine

## 2011-06-05 ENCOUNTER — Other Ambulatory Visit: Payer: Self-pay | Admitting: *Deleted

## 2011-06-05 DIAGNOSIS — I10 Essential (primary) hypertension: Secondary | ICD-10-CM

## 2011-06-05 MED ORDER — HYDROCHLOROTHIAZIDE 25 MG PO TABS: 25.0000 mg | ORAL_TABLET | Freq: Every day | ORAL | Status: DC

## 2011-06-16 ENCOUNTER — Other Ambulatory Visit: Payer: Self-pay | Admitting: Internal Medicine

## 2011-06-16 ENCOUNTER — Other Ambulatory Visit: Payer: Self-pay | Admitting: *Deleted

## 2011-06-16 DIAGNOSIS — I10 Essential (primary) hypertension: Secondary | ICD-10-CM

## 2011-06-16 DIAGNOSIS — E785 Hyperlipidemia, unspecified: Secondary | ICD-10-CM

## 2011-06-16 MED ORDER — ATENOLOL 25 MG PO TABS: 25.0000 mg | ORAL_TABLET | Freq: Two times a day (BID) | ORAL | Status: DC

## 2011-06-16 MED ORDER — SIMVASTATIN 40 MG PO TABS: 40.0000 mg | ORAL_TABLET | Freq: Every day | ORAL | Status: DC

## 2011-08-14 ENCOUNTER — Other Ambulatory Visit: Payer: Self-pay | Admitting: *Deleted

## 2011-08-14 DIAGNOSIS — I10 Essential (primary) hypertension: Secondary | ICD-10-CM

## 2011-08-14 MED ORDER — HYDROCHLOROTHIAZIDE 25 MG PO TABS: 25.0000 mg | ORAL_TABLET | Freq: Every day | ORAL | Status: DC

## 2011-08-22 ENCOUNTER — Ambulatory Visit (INDEPENDENT_AMBULATORY_CARE_PROVIDER_SITE_OTHER): Payer: Medicare Other | Admitting: Cardiology

## 2011-08-22 ENCOUNTER — Encounter: Payer: Self-pay | Admitting: Cardiology

## 2011-08-22 DIAGNOSIS — I10 Essential (primary) hypertension: Secondary | ICD-10-CM

## 2011-08-22 DIAGNOSIS — E785 Hyperlipidemia, unspecified: Secondary | ICD-10-CM

## 2011-08-22 DIAGNOSIS — I421 Obstructive hypertrophic cardiomyopathy: Secondary | ICD-10-CM

## 2011-08-22 DIAGNOSIS — F17200 Nicotine dependence, unspecified, uncomplicated: Secondary | ICD-10-CM

## 2011-08-22 MED ORDER — HYDROCHLOROTHIAZIDE 25 MG PO TABS: 25.0000 mg | ORAL_TABLET | Freq: Every day | ORAL | Status: DC

## 2011-08-22 MED ORDER — ATENOLOL 25 MG PO TABS: 25.0000 mg | ORAL_TABLET | Freq: Two times a day (BID) | ORAL | Status: DC

## 2011-08-22 NOTE — Assessment & Plan Note (Signed)
Patient remains asymptomatic. Continue beta blocker. They are not interested in pursuing ICD. They understand risk of sudden death.

## 2011-08-22 NOTE — Progress Notes (Signed)
   HPI: Pleasant male for FU of hypertrophic cardiomyopathy. Abdominal ultrasound in August of 2011 showed no aneurysm. Carotid Dopplers showed 0-39% bilateral stenosis. Echocardiogram in May of 2011 revealed severe asymmetric hypertrophy of the septum. Systolic function was hyperdynamic. Doppler parameters are consistent with abnormal left ventricular relaxation (grade 1 diastolic dysfunction). Doppler parameters are consistent with elevated mean left atrial filling pressure. Mild AI, systolic anterior motion of the MV with moderate regurgitation directed posteriorly. Findings suggest hypertrophic cardiomyopathy without significant obstruction at rest. Holter monitor showed nonsustained ventricular tachycardia (7 beats). Stress echocardiogram showed possible inferior hypokinesis but systolic blood pressure decreased with exercise. We discussed referral for ICD and the family stated they would consider; they ultimately declined understanding risk of sudden cardiac death. Since I last saw him in April of 2012, the patient denies any dyspnea on exertion, orthopnea, PND, pedal edema, palpitations, syncope or chest pain.   Current Outpatient Prescriptions  Medication Sig Dispense Refill  . atenolol (TENORMIN) 25 MG tablet Take 1 tablet (25 mg total) by mouth 2 (two) times daily.  180 tablet  3  . hydrochlorothiazide (HYDRODIURIL) 25 MG tablet Take 1 tablet (25 mg total) by mouth daily.  90 tablet  3  . simvastatin (ZOCOR) 40 MG tablet Take 1 tablet (40 mg total) by mouth at bedtime.  90 tablet  3  . zoster vaccine live, PF, (ZOSTAVAX) 16109 UNT/0.65ML injection Inject 19,400 Units into the skin once.  1 vial  0     Past Medical History  Diagnosis Date  . Hypertrophic obstructive cardiomyopathy   . Mitral regurgitation   . Hypertension   . Dyslipidemia   . Diastolic dysfunction   . Elevated PSA   . Diverticulosis   . Aspergilloma     No past surgical history on file.  History   Social History   . Marital Status: Single    Spouse Name: N/A    Number of Children: N/A  . Years of Education: N/A   Occupational History  . Not on file.   Social History Main Topics  . Smoking status: Current Some Day Smoker    Types: Cigarettes  . Smokeless tobacco: Not on file  . Alcohol Use: No  . Drug Use: No  . Sexually Active: Not on file   Other Topics Concern  . Not on file   Social History Narrative  . No narrative on file    ROS: no fevers or chills, productive cough, hemoptysis, dysphasia, odynophagia, melena, hematochezia, dysuria, hematuria, rash, seizure activity, orthopnea, PND, pedal edema, claudication. Remaining systems are negative.  Physical Exam: Well-developed well-nourished in no acute distress.  Skin is warm and dry.  HEENT is normal.  Neck is supple.  Chest is clear to auscultation with normal expansion.  Cardiovascular exam is regular rate and rhythm. 2/6 systolic murmur left sternal border that increases with Valsalva. Abdominal exam nontender or distended. No masses palpated. Extremities show no edema. neuro grossly intact  ECG Sinus rhythm at a rate of 75. Axis normal. Occasional PVC. Left ventricular hypertrophy with repolarization abnormality.

## 2011-08-22 NOTE — Patient Instructions (Signed)
Your physician recommends that you continue on your current medications as directed. Please refer to the Current Medication list given to you today.   Your physician wants you to follow-up in: 1 year with Dr. Crenshaw You will receive a reminder letter in the mail two months in advance. If you don't receive a letter, please call our office to schedule the follow-up appointment.   

## 2011-08-22 NOTE — Assessment & Plan Note (Signed)
Continue statin. Lipids and liver monitored by primary care. 

## 2011-08-22 NOTE — Assessment & Plan Note (Signed)
Blood pressure controlled. Continue present medications. Potassium and renal function monitored by primary care. 

## 2011-08-22 NOTE — Assessment & Plan Note (Signed)
Patient counseled on discontinuing. 

## 2011-10-02 ENCOUNTER — Encounter: Payer: Self-pay | Admitting: Cardiology

## 2012-01-28 ENCOUNTER — Ambulatory Visit (INDEPENDENT_AMBULATORY_CARE_PROVIDER_SITE_OTHER): Payer: Medicare Other | Admitting: *Deleted

## 2012-01-28 DIAGNOSIS — Z23 Encounter for immunization: Secondary | ICD-10-CM

## 2012-02-20 ENCOUNTER — Encounter: Payer: Self-pay | Admitting: Internal Medicine

## 2012-02-20 ENCOUNTER — Ambulatory Visit (INDEPENDENT_AMBULATORY_CARE_PROVIDER_SITE_OTHER): Payer: Medicare Other | Admitting: Internal Medicine

## 2012-02-20 VITALS — BP 133/83 | HR 78 | Temp 97.3°F | Ht 72.0 in | Wt 123.2 lb

## 2012-02-20 DIAGNOSIS — E785 Hyperlipidemia, unspecified: Secondary | ICD-10-CM

## 2012-02-20 DIAGNOSIS — N189 Chronic kidney disease, unspecified: Secondary | ICD-10-CM

## 2012-02-20 DIAGNOSIS — Z23 Encounter for immunization: Secondary | ICD-10-CM

## 2012-02-20 DIAGNOSIS — F17200 Nicotine dependence, unspecified, uncomplicated: Secondary | ICD-10-CM

## 2012-02-20 DIAGNOSIS — Z0000 Encounter for general adult medical examination without abnormal findings: Secondary | ICD-10-CM

## 2012-02-20 DIAGNOSIS — I10 Essential (primary) hypertension: Secondary | ICD-10-CM

## 2012-02-20 DIAGNOSIS — N182 Chronic kidney disease, stage 2 (mild): Secondary | ICD-10-CM | POA: Insufficient documentation

## 2012-02-20 LAB — CBC WITH DIFFERENTIAL/PLATELET
Basophils Relative: 1 % (ref 0–1)
Eosinophils Absolute: 0.1 10*3/uL (ref 0.0–0.7)
Eosinophils Relative: 2 % (ref 0–5)
Hemoglobin: 14.7 g/dL (ref 13.0–17.0)
Lymphs Abs: 2.2 10*3/uL (ref 0.7–4.0)
MCH: 28.9 pg (ref 26.0–34.0)
MCHC: 32.4 g/dL (ref 30.0–36.0)
MCV: 89.4 fL (ref 78.0–100.0)
Monocytes Relative: 7 % (ref 3–12)
Neutrophils Relative %: 38 % — ABNORMAL LOW (ref 43–77)
RBC: 5.08 MIL/uL (ref 4.22–5.81)

## 2012-02-20 MED ORDER — PNEUMOCOCCAL VAC POLYVALENT 25 MCG/0.5ML IJ INJ: 0.5000 mL | INJECTION | INTRAMUSCULAR | Status: DC

## 2012-02-20 NOTE — Assessment & Plan Note (Signed)
Check cmp to monitor CKD, likely secondary to HTN.

## 2012-02-20 NOTE — Progress Notes (Signed)
HPI The patient is a 61 y.o. yo male with a history of HTN, HL, tobacco abuse, presenting for a yearly follow-up.  The patient notes that he has been cutting back on his tobacco usage recently, now smoking only a few cigarettes per day.  The patient was asked about barriers to quitting altogether, and states "I just like them from time to time".  The patient stated he was not ready to quit altogether at this time.  The patient has a history of HTN.  BP well-controlled today.  The patient has a history of HL.  Last LDL = 100 on zocor.  The patient has a history of HOCM, followed by Dr. Jens Som.  Patient continues to state that he does not want to pursue ICD.  The patient has a history of CKD, thought to be secondary to HTN, stable over the last several years.  ROS: General: no fevers, chills, changes in weight, changes in appetite Skin: no rash HEENT: no blurry vision, hearing changes, sore throat Pulm: no dyspnea, coughing, wheezing CV: no chest pain, palpitations, shortness of breath Abd: no abdominal pain, nausea/vomiting, diarrhea/constipation GU: no dysuria, hematuria, polyuria Ext: no arthralgias, myalgias Neuro: no weakness, numbness, or tingling  Filed Vitals:   02/20/12 1406  BP: 133/83  Pulse: 78  Temp: 97.3 F (36.3 C)    PEX General: alert, cooperative, and in no apparent distress HEENT: pupils equal round and reactive to light, vision grossly intact, oropharynx clear and non-erythematous  Neck: supple, no lymphadenopathy, JVD, or carotid bruits.  Thyroid mobile, non-tender, no nodules. Lungs: clear to ascultation bilaterally, normal work of respiration, no wheezes, rales, ronchi Heart: regular rate and rhythm, no murmurs, gallops, or rubs Abdomen: soft, non-tender, non-distended, normal bowel sounds Extremities: no cyanosis, clubbing, or edema Neurologic: alert & oriented X3, cranial nerves II-XII intact, strength grossly intact, sensation intact to light  touch  Current Outpatient Prescriptions on File Prior to Visit  Medication Sig Dispense Refill  . atenolol (TENORMIN) 25 MG tablet Take 1 tablet (25 mg total) by mouth 2 (two) times daily.  180 tablet  3  . hydrochlorothiazide (HYDRODIURIL) 25 MG tablet Take 1 tablet (25 mg total) by mouth daily.  90 tablet  3  . simvastatin (ZOCOR) 40 MG tablet Take 1 tablet (40 mg total) by mouth at bedtime.  90 tablet  3  . zoster vaccine live, PF, (ZOSTAVAX) 16109 UNT/0.65ML injection Inject 19,400 Units into the skin once.  1 vial  0    Assessment/Plan

## 2012-02-20 NOTE — Assessment & Plan Note (Signed)
-  patient given pneumovax -patient already received flu shot

## 2012-02-20 NOTE — Assessment & Plan Note (Signed)
BP well controlled.

## 2012-02-20 NOTE — Assessment & Plan Note (Signed)
LDL = 100 last year.  Patient on zocor. -continue zocor -consider lipid panel at next visit

## 2012-02-20 NOTE — Patient Instructions (Signed)
Your blood pressure is well-controlled today.  Continue to take your Atenolol and Hydrochlorothiazide.  We are checking labs today.  We will call you if anything is abnormal.  If you do not hear from [place], you can assume that all of your labs were normal.  We discussed smoking cessation today.  If you feel like you are ready to quit, there are several products I can prescribe for you (such as nicotine patches) to help you quit.  Please return for a follow-up visit in 1 year.

## 2012-02-20 NOTE — Assessment & Plan Note (Signed)
Patient encouraged to quit.  Informed that we have tobacco replacement products and medications that can help with smoking cessation, as well as a Child psychotherapist who can provide counseling on smoking cessation.

## 2012-02-21 LAB — COMPLETE METABOLIC PANEL WITH GFR
ALT: 12 U/L (ref 0–53)
AST: 25 U/L (ref 0–37)
CO2: 29 mEq/L (ref 19–32)
Calcium: 9.9 mg/dL (ref 8.4–10.5)
Chloride: 102 mEq/L (ref 96–112)
GFR, Est African American: 77 mL/min
Sodium: 141 mEq/L (ref 135–145)
Total Bilirubin: 0.9 mg/dL (ref 0.3–1.2)
Total Protein: 7.3 g/dL (ref 6.0–8.3)

## 2012-07-05 ENCOUNTER — Other Ambulatory Visit: Payer: Self-pay | Admitting: Internal Medicine

## 2012-07-21 ENCOUNTER — Encounter: Payer: Self-pay | Admitting: Internal Medicine

## 2012-07-21 ENCOUNTER — Ambulatory Visit (INDEPENDENT_AMBULATORY_CARE_PROVIDER_SITE_OTHER): Payer: Medicare Other | Admitting: Internal Medicine

## 2012-07-21 VITALS — BP 126/79 | HR 64 | Temp 97.0°F | Ht 72.0 in | Wt 125.2 lb

## 2012-07-21 DIAGNOSIS — I10 Essential (primary) hypertension: Secondary | ICD-10-CM

## 2012-07-21 DIAGNOSIS — R972 Elevated prostate specific antigen [PSA]: Secondary | ICD-10-CM

## 2012-07-21 DIAGNOSIS — F17200 Nicotine dependence, unspecified, uncomplicated: Secondary | ICD-10-CM

## 2012-07-21 DIAGNOSIS — Z0000 Encounter for general adult medical examination without abnormal findings: Secondary | ICD-10-CM

## 2012-07-21 DIAGNOSIS — Z23 Encounter for immunization: Secondary | ICD-10-CM

## 2012-07-21 MED ORDER — HYDROCHLOROTHIAZIDE 25 MG PO TABS: 25.0000 mg | ORAL_TABLET | Freq: Every day | ORAL | Status: DC

## 2012-07-21 MED ORDER — ATENOLOL 25 MG PO TABS: 25.0000 mg | ORAL_TABLET | Freq: Two times a day (BID) | ORAL | Status: DC

## 2012-07-21 MED ORDER — SIMVASTATIN 40 MG PO TABS: 40.0000 mg | ORAL_TABLET | Freq: Every day | ORAL | Status: DC

## 2012-07-21 MED ORDER — NICOTINE POLACRILEX 2 MG MT GUM: 2.0000 mg | CHEWING_GUM | OROMUCOSAL | Status: DC | PRN

## 2012-07-21 NOTE — Assessment & Plan Note (Signed)
BP Readings from Last 3 Encounters:  07/21/12 126/79  02/20/12 133/83  08/22/11 126/78    Lab Results  Component Value Date   NA 141 02/20/2012   K 4.7 02/20/2012   CREATININE 1.14 02/20/2012    Assessment:  Blood pressure control: controlled  Progress toward BP goal:  at goal  Comments: At goal  Plan:  Medications:  continue current medications  Educational resources provided:    Self management tools provided:    Other plans: None

## 2012-07-21 NOTE — Progress Notes (Signed)
HPI The patient is a 62 y.o. male with a history of HTN, tobacco abuse, HL, HOCM, CKD, presenting for a routine follow-up visit.  The patient notes no new complaints today.  The patient continues to smoke cigarettes, but has cut down to 1/day.  He has been unable to stop completely due to cravings.  [name] interested in trying nicotine gum.  The patient has a history of BPH, with an elevated PSA in the past, and a prostate biopsy which was "normal".  The patient notes urinary frequency throughout the day (though he notes drinking a lot of water throughout the day), unchanged for the last >1 year, but no weak stream, feelings of incomplete voiding, or dysuria.  The patient does not still currently follow with Urology.  ROS: General: no fevers, chills, changes in weight, changes in appetite Skin: no rash HEENT: no blurry vision, hearing changes, sore throat Pulm: no dyspnea, coughing, wheezing CV: no chest pain, palpitations, shortness of breath Abd: no abdominal pain, nausea/vomiting, diarrhea/constipation GU: no dysuria, hematuria, polyuria Ext: no arthralgias, myalgias Neuro: no weakness, numbness, or tingling  Filed Vitals:   07/21/12 1536  BP: 126/79  Pulse: 64  Temp: 97 F (36.1 C)    PEX General: alert, cooperative, and in no apparent distress HEENT: pupils equal round and reactive to light, vision grossly intact, oropharynx clear and non-erythematous  Neck: supple, no lymphadenopathy Lungs: clear to ascultation bilaterally, normal work of respiration, no wheezes, rales, ronchi Chest: soft, raised, 7 mm papule (c/w lipoma) on left chest, medial and inferior to L nipple Heart: regular rate and rhythm, grade 3/6 systolic murmur best heard at apex Abdomen: soft, non-tender, non-distended, normal bowel sounds Extremities: no cyanosis, clubbing, or edema Neurologic: alert & oriented X3, cranial nerves II-XII intact, strength grossly intact, sensation intact to light touch  Current  Outpatient Prescriptions on File Prior to Visit  Medication Sig Dispense Refill  . atenolol (TENORMIN) 25 MG tablet Take 1 tablet (25 mg total) by mouth 2 (two) times daily.  180 tablet  3  . hydrochlorothiazide (HYDRODIURIL) 25 MG tablet Take 1 tablet (25 mg total) by mouth daily.  90 tablet  3  . simvastatin (ZOCOR) 40 MG tablet TAKE ONE TABLET AT BEDTIME  90 tablet  3   No current facility-administered medications on file prior to visit.    Assessment/Plan

## 2012-07-21 NOTE — Assessment & Plan Note (Signed)
Pneumovax given today

## 2012-07-21 NOTE — Assessment & Plan Note (Signed)
The patient is interested in quitting.  Currently smoking 1 cig/day. -prescribed nicotine gum to use PRN with cravings -gave patient info on 1-800 quitline

## 2012-07-21 NOTE — Patient Instructions (Signed)
General Instructions: To help you quit smoking, we are prescribing Nicotine gum.  Chew 1 piece at a time as needed throughout the day to combat cravings.  Your blood pressure is well-controlled today.  I have sent in refills for all of your medications.  Please return for a follow-up visit in 1 year.   Treatment Goals:  Goals (1 Years of Data) as of 07/21/12   None      Progress Toward Treatment Goals:  Treatment Goal 07/21/2012  Blood pressure at goal  Stop smoking smoking less    Self Care Goals & Plans:  Self Care Goal 07/21/2012  Manage my medications take my medicines as prescribed; refill my medications on time  Eat healthy foods drink diet soda or water instead of juice or soda; eat foods that are low in salt; eat baked foods instead of fried foods  Be physically active take a walk every day  Stop smoking call [facility] (1-800-QUIT-NOW)       Care Management & Community Referrals:  Referral 07/21/2012  Referrals made for care management support none needed

## 2012-07-21 NOTE — Assessment & Plan Note (Signed)
Patient has a history of elevated PSA.  He notes no new urinary complaints.  I've called [facility] to obtain the path report from biopsy 11/2009.

## 2012-09-02 ENCOUNTER — Ambulatory Visit (INDEPENDENT_AMBULATORY_CARE_PROVIDER_SITE_OTHER): Payer: Medicare Other | Admitting: Cardiology

## 2012-09-02 ENCOUNTER — Encounter: Payer: Self-pay | Admitting: Cardiology

## 2012-09-02 VITALS — BP 130/79 | HR 67 | Wt 125.0 lb

## 2012-09-02 DIAGNOSIS — I10 Essential (primary) hypertension: Secondary | ICD-10-CM

## 2012-09-02 DIAGNOSIS — E785 Hyperlipidemia, unspecified: Secondary | ICD-10-CM

## 2012-09-02 DIAGNOSIS — F17200 Nicotine dependence, unspecified, uncomplicated: Secondary | ICD-10-CM

## 2012-09-02 DIAGNOSIS — I421 Obstructive hypertrophic cardiomyopathy: Secondary | ICD-10-CM

## 2012-09-02 NOTE — Assessment & Plan Note (Signed)
Repeat echocardiogram. Patient has declined ICD in the past. They understand risk of sudden death. I discussed the need for his siblings to be screened for hypertrophic cardiomyopathy. I also will provide information concerning genetic testing. They will contact us if they wish to proceed.

## 2012-09-02 NOTE — Assessment & Plan Note (Signed)
Management per primary care. 

## 2012-09-02 NOTE — Assessment & Plan Note (Signed)
Patient counseled on discontinuing. 

## 2012-09-02 NOTE — Patient Instructions (Signed)

## 2012-09-02 NOTE — Assessment & Plan Note (Signed)
Blood pressure controlled.continue present medications. 

## 2012-09-02 NOTE — Progress Notes (Signed)
   HPI: Pleasant male for FU of hypertrophic cardiomyopathy. Abdominal ultrasound in August of 2011 showed no aneurysm. Carotid Dopplers showed 0-39% bilateral stenosis. Echocardiogram in May of 2011 revealed severe asymmetric hypertrophy of the septum. Systolic function was hyperdynamic. Doppler parameters are consistent with abnormal left ventricular relaxation (grade 1 diastolic dysfunction). Doppler parameters are consistent with elevated mean left atrial filling pressure. Mild AI, systolic anterior motion of the MV with moderate regurgitation directed posteriorly. Findings suggest hypertrophic cardiomyopathy without significant obstruction at rest. Holter monitor showed nonsustained ventricular tachycardia (7 beats). Stress echocardiogram showed possible inferior hypokinesis but systolic blood pressure decreased with exercise. We discussed referral for ICD but the family ultimately declined understanding risk of sudden cardiac death. Since I last saw him in April of 2013, the patient denies any dyspnea on exertion, orthopnea, PND, pedal edema, palpitations, syncope or chest pain.   Current Outpatient Prescriptions  Medication Sig Dispense Refill  . atenolol (TENORMIN) 25 MG tablet Take 1 tablet (25 mg total) by mouth 2 (two) times daily.  180 tablet  3  . hydrochlorothiazide (HYDRODIURIL) 25 MG tablet Take 1 tablet (25 mg total) by mouth daily.  90 tablet  3  . nicotine polacrilex (NICORETTE) 2 MG gum Take 1 each (2 mg total) by mouth as needed for smoking cessation.  110 tablet  2  . simvastatin (ZOCOR) 40 MG tablet Take 1 tablet (40 mg total) by mouth at bedtime.  90 tablet  3   No current facility-administered medications for this visit.     Past Medical History  Diagnosis Date  . Hypertrophic obstructive cardiomyopathy   . Mitral regurgitation   . Hypertension   . Dyslipidemia   . Diastolic dysfunction   . Elevated PSA   . Diverticulosis   . Aspergilloma     Past Surgical History    Procedure Laterality Date  . Aspergilloma resection      LUL, s/p resection Feb 1991    History   Social History  . Marital Status: Single    Spouse Name: N/A    Number of Children: N/A  . Years of Education: N/A   Occupational History  . Not on file.   Social History Main Topics  . Smoking status: Current Some Day Smoker    Types: Cigarettes  . Smokeless tobacco: Not on file  . Alcohol Use: No  . Drug Use: No  . Sexually Active: Not on file   Other Topics Concern  . Not on file   Social History Narrative  . No narrative on file    ROS: no fevers or chills, productive cough, hemoptysis, dysphasia, odynophagia, melena, hematochezia, dysuria, hematuria, rash, seizure activity, orthopnea, PND, pedal edema, claudication. Remaining systems are negative.  Physical Exam: Well-developed well-nourished in no acute distress.  Skin is warm and dry.  HEENT is normal.  Neck is supple.  Chest is clear to auscultation with normal expansion.  Cardiovascular exam is regular rate and rhythm. 2/6 systolic murmur left sternal border. Abdominal exam nontender or distended. No masses palpated. Extremities show no edema. neuro grossly intact  ECG sinus rhythm at a rate of 67. Left ventricular hypertrophy with repolarization abnormality. Left atrial enlargement.

## 2012-12-29 ENCOUNTER — Telehealth: Payer: Self-pay | Admitting: Cardiology

## 2012-12-29 NOTE — Telephone Encounter (Signed)
Left message for pt to call.

## 2012-12-29 NOTE — Telephone Encounter (Signed)
Pt sister rtn your call, pls call [phone]

## 2012-12-29 NOTE — Telephone Encounter (Signed)
New problem   Pt want to speak to nurse concerning scheduling an ultrasound that the Dr said he need to have.

## 2012-12-29 NOTE — Telephone Encounter (Signed)
Spoke with pt sister, follow up echo scheduled.

## 2013-01-14 ENCOUNTER — Ambulatory Visit (HOSPITAL_COMMUNITY): Payer: Medicare Other | Attending: Cardiology | Admitting: Radiology

## 2013-01-14 DIAGNOSIS — I10 Essential (primary) hypertension: Secondary | ICD-10-CM | POA: Insufficient documentation

## 2013-01-14 DIAGNOSIS — R011 Cardiac murmur, unspecified: Secondary | ICD-10-CM | POA: Insufficient documentation

## 2013-01-14 DIAGNOSIS — I379 Nonrheumatic pulmonary valve disorder, unspecified: Secondary | ICD-10-CM | POA: Insufficient documentation

## 2013-01-14 DIAGNOSIS — F17200 Nicotine dependence, unspecified, uncomplicated: Secondary | ICD-10-CM | POA: Insufficient documentation

## 2013-01-14 DIAGNOSIS — I079 Rheumatic tricuspid valve disease, unspecified: Secondary | ICD-10-CM | POA: Insufficient documentation

## 2013-01-14 DIAGNOSIS — I319 Disease of pericardium, unspecified: Secondary | ICD-10-CM | POA: Insufficient documentation

## 2013-01-14 DIAGNOSIS — I421 Obstructive hypertrophic cardiomyopathy: Secondary | ICD-10-CM | POA: Insufficient documentation

## 2013-01-14 DIAGNOSIS — E785 Hyperlipidemia, unspecified: Secondary | ICD-10-CM | POA: Insufficient documentation

## 2013-01-14 DIAGNOSIS — I080 Rheumatic disorders of both mitral and aortic valves: Secondary | ICD-10-CM | POA: Insufficient documentation

## 2013-01-14 NOTE — Progress Notes (Signed)
Echocardiogram performed.  

## 2013-03-23 ENCOUNTER — Ambulatory Visit (INDEPENDENT_AMBULATORY_CARE_PROVIDER_SITE_OTHER): Payer: Medicare Other | Admitting: Internal Medicine

## 2013-03-23 ENCOUNTER — Encounter: Payer: Self-pay | Admitting: Internal Medicine

## 2013-03-23 VITALS — BP 127/73 | HR 66 | Temp 97.8°F | Ht 72.5 in | Wt 123.1 lb

## 2013-03-23 DIAGNOSIS — N189 Chronic kidney disease, unspecified: Secondary | ICD-10-CM

## 2013-03-23 DIAGNOSIS — I10 Essential (primary) hypertension: Secondary | ICD-10-CM

## 2013-03-23 DIAGNOSIS — Z23 Encounter for immunization: Secondary | ICD-10-CM

## 2013-03-23 DIAGNOSIS — F17200 Nicotine dependence, unspecified, uncomplicated: Secondary | ICD-10-CM

## 2013-03-23 LAB — CBC
Hemoglobin: 13.6 g/dL (ref 13.0–17.0)
MCH: 28.9 pg (ref 26.0–34.0)
MCHC: 32.7 g/dL (ref 30.0–36.0)
Platelets: 148 10*3/uL — ABNORMAL LOW (ref 150–400)
RDW: 15 % (ref 11.5–15.5)

## 2013-03-23 LAB — BASIC METABOLIC PANEL
BUN: 18 mg/dL (ref 6–23)
CO2: 30 mEq/L (ref 19–32)
Calcium: 9.2 mg/dL (ref 8.4–10.5)
Chloride: 101 mEq/L (ref 96–112)
Creat: 1.24 mg/dL (ref 0.50–1.35)
Glucose, Bld: 65 mg/dL — ABNORMAL LOW (ref 70–99)

## 2013-03-23 MED ORDER — ATENOLOL 25 MG PO TABS: 25.0000 mg | ORAL_TABLET | Freq: Two times a day (BID) | ORAL | Status: DC

## 2013-03-23 MED ORDER — HYDROCHLOROTHIAZIDE 25 MG PO TABS: 25.0000 mg | ORAL_TABLET | Freq: Every day | ORAL | Status: DC

## 2013-03-23 MED ORDER — SIMVASTATIN 40 MG PO TABS: 40.0000 mg | ORAL_TABLET | Freq: Every day | ORAL | Status: DC

## 2013-03-23 NOTE — Assessment & Plan Note (Signed)
  Assessment: Progress toward smoking cessation:  smoking less Barriers to progress toward smoking cessation:  withdrawal symptoms Comments: The patient has cut back, and is trying to quit.  We discussed that it would be ok for him to try the nicotine gum, and that if he continues to smoke an occasional cigarette he likely will not "overdose" on nicotine due to the gum.  Plan: Instruction/counseling given:  I counseled patient on the dangers of tobacco use, advised patient to stop smoking, and reviewed strategies to maximize success. Educational resources provided:  [facility] [employer]) brochure Self management tools provided:  smoking cessation plan (STAR Quit Plan) Medications to assist with smoking cessation:  Nicotine Gum Patient agreed to the following self-care plans for smoking cessation: call [facility] (1-800-QUIT-NOW);cut down the number of cigarettes smoked  Other plans: Reassess at next visit

## 2013-03-23 NOTE — Assessment & Plan Note (Addendum)
BP Readings from Last 3 Encounters:  03/23/13 127/73  09/02/12 130/79  07/21/12 126/79    Lab Results  Component Value Date   NA 141 02/20/2012   K 4.7 02/20/2012   CREATININE 1.14 02/20/2012    Assessment: Blood pressure control: controlled Progress toward BP goal:  at goal Comments: BP well-controlled  Plan: Medications:  continue current medications Educational resources provided:   Self management tools provided:   Other plans: Checking routine labs today

## 2013-03-23 NOTE — Progress Notes (Signed)
HPI The patient is a 62 y.o. male with a history of HTN, CKD, tobacco abuse, HOCM, presenting for a follow-up visit for tobacco abuse.  The patient continues to smoke.  He notes that he is trying to quit, and has cut back to about 1 pack per week.  He expressed interest in trying nicotine gum in the past, but was under the impression that he could not use this if he still smoked an occasional cigarette.  The patient has a history of hypertension. Blood pressure is well controlled today.  ROS: General: no fevers, chills, changes in weight, changes in appetite Skin: no rash HEENT: no blurry vision, hearing changes, sore throat Pulm: no dyspnea, coughing, wheezing CV: no chest pain, palpitations, shortness of breath Abd: no abdominal pain, nausea/vomiting, diarrhea/constipation GU: no dysuria, hematuria, polyuria Ext: no arthralgias, myalgias Neuro: no weakness, numbness, or tingling  Filed Vitals:   03/23/13 1600  BP: 127/73  Pulse: 66  Temp: 97.8 F (36.6 C)    PEX General: alert, cooperative, and in no apparent distress HEENT: pupils equal round and reactive to light, vision grossly intact, oropharynx clear and non-erythematous  Neck: supple Lungs: clear to ascultation bilaterally, normal work of respiration, no wheezes, rales, ronchi Heart: regular rate and rhythm, no murmurs, gallops, or rubs Abdomen: soft, non-tender, non-distended, normal bowel sounds Extremities: no cyanosis, clubbing, or edema Neurologic: alert & oriented X3, cranial nerves II-XII intact, strength grossly intact, sensation intact to light touch  Current Outpatient Prescriptions on File Prior to Visit  Medication Sig Dispense Refill  . atenolol (TENORMIN) 25 MG tablet Take 1 tablet (25 mg total) by mouth 2 (two) times daily.  180 tablet  3  . hydrochlorothiazide (HYDRODIURIL) 25 MG tablet Take 1 tablet (25 mg total) by mouth daily.  90 tablet  3  . nicotine polacrilex (NICORETTE) 2 MG gum Take 1 each (2  mg total) by mouth as needed for smoking cessation.  110 tablet  2  . simvastatin (ZOCOR) 40 MG tablet Take 1 tablet (40 mg total) by mouth at bedtime.  90 tablet  3   No current facility-administered medications on file prior to visit.    Assessment/Plan

## 2013-03-23 NOTE — Patient Instructions (Signed)
General Instructions: Quitting smoking is the best thing you can do for your health.  I'm proud of you for cutting back!  Please review the pamphlet we've given you, and consider using the nicotine gum.  We are checking some basic labs today.  If anything is abnormal, we will contact you.  If you do not hear from [place], you can assume these labs were normal.  Please return for a follow-up visit in 6 months.   Treatment Goals:  Goals (1 Years of Data) as of 03/23/13   None      Progress Toward Treatment Goals:  Treatment Goal 03/23/2013  Blood pressure at goal  Stop smoking smoking less    Self Care Goals & Plans:  Self Care Goal 03/23/2013  Manage my medications take my medicines as prescribed; bring my medications to every visit; refill my medications on time  Eat healthy foods eat baked foods instead of fried foods; eat foods that are low in salt  Be physically active take a walk every day  Stop smoking call [facility] (1-800-QUIT-NOW); cut down the number of cigarettes smoked    No flowsheet data found.   Care Management & Community Referrals:  Referral 03/23/2013  Referrals made for care management support none needed

## 2013-03-29 NOTE — Progress Notes (Signed)
Case discussed with Dr. Brown at the time of the visit.  We reviewed the resident's history and exam and pertinent patient test results.  I agree with the assessment, diagnosis, and plan of care documented in the resident's note. 

## 2013-09-05 ENCOUNTER — Encounter (INDEPENDENT_AMBULATORY_CARE_PROVIDER_SITE_OTHER): Payer: Self-pay

## 2013-09-05 ENCOUNTER — Ambulatory Visit (INDEPENDENT_AMBULATORY_CARE_PROVIDER_SITE_OTHER): Payer: Medicare Other | Admitting: Cardiology

## 2013-09-05 ENCOUNTER — Encounter: Payer: Self-pay | Admitting: Cardiology

## 2013-09-05 VITALS — BP 128/80 | HR 84 | Ht 72.5 in | Wt 123.8 lb

## 2013-09-05 DIAGNOSIS — E785 Hyperlipidemia, unspecified: Secondary | ICD-10-CM

## 2013-09-05 DIAGNOSIS — I421 Obstructive hypertrophic cardiomyopathy: Secondary | ICD-10-CM

## 2013-09-05 DIAGNOSIS — I10 Essential (primary) hypertension: Secondary | ICD-10-CM

## 2013-09-05 DIAGNOSIS — F17200 Nicotine dependence, unspecified, uncomplicated: Secondary | ICD-10-CM

## 2013-09-05 NOTE — Patient Instructions (Signed)
Your physician wants you to follow-up in: ONE YEAR WITH DR CRENSHAW You will receive a reminder letter in the mail two months in advance. If you don't receive a letter, please call our office to schedule the follow-up appointment.  

## 2013-09-05 NOTE — Assessment & Plan Note (Signed)
Blood pressure controlled. Continue present medications. 

## 2013-09-05 NOTE — Progress Notes (Signed)
      HPI: FU hypertrophic cardiomyopathy. Abdominal ultrasound in August of 2011 showed no aneurysm. Carotid Dopplers showed 0-39% bilateral stenosis. Holter monitor showed nonsustained ventricular tachycardia (7 beats). Stress echocardiogram showed possible inferior hypokinesis but systolic blood pressure decreased with exercise. We discussed referral for ICD but the family ultimately declined understanding risk of sudden cardiac death. Last echocardiogram in Jan 27, 2013 showed normal LV function, grade 2 diastolic dysfunction, mild biatrial enlargement, mild aortic and mitral regurgitation. There is probable apical variant cardiomyopathy. Since I last saw him in April of 2014, the patient denies any dyspnea on exertion, orthopnea, PND, pedal edema, palpitations, syncope or chest pain.   Current Outpatient Prescriptions  Medication Sig Dispense Refill  . atenolol (TENORMIN) 25 MG tablet Take 1 tablet (25 mg total) by mouth 2 (two) times daily.  180 tablet  3  . hydrochlorothiazide (HYDRODIURIL) 25 MG tablet Take 1 tablet (25 mg total) by mouth daily.  90 tablet  3  . simvastatin (ZOCOR) 40 MG tablet Take 1 tablet (40 mg total) by mouth at bedtime.  90 tablet  3   No current facility-administered medications for this visit.     Past Medical History  Diagnosis Date  . Hypertrophic obstructive cardiomyopathy   . Mitral regurgitation   . Hypertension   . Dyslipidemia   . Diastolic dysfunction   . Elevated PSA   . Diverticulosis   . Aspergilloma     Past Surgical History  Procedure Laterality Date  . Aspergilloma resection      LUL, s/p resection Feb 1991    History   Social History  . Marital Status: Single    Spouse Name: N/A    Number of Children: N/A  . Years of Education: N/A   Occupational History  . Not on file.   Social History Main Topics  . Smoking status: Current Some Day Smoker    Types: Cigarettes  . Smokeless tobacco: Not on file  . Alcohol Use: No  .  Drug Use: No  . Sexual Activity: Not on file   Other Topics Concern  . Not on file   Social History Narrative  . No narrative on file    ROS: no fevers or chills, productive cough, hemoptysis, dysphasia, odynophagia, melena, hematochezia, dysuria, hematuria, rash, seizure activity, orthopnea, PND, pedal edema, claudication. Remaining systems are negative.  Physical Exam: Well-developed well-nourished in no acute distress.  Skin is warm and dry.  HEENT is normal.  Neck is supple.  Chest is clear to auscultation with normal expansion.  Cardiovascular exam is regular rate and rhythm. 2/6 systolic murmur Abdominal exam nontender or distended. No masses palpated. Extremities show no edema. neuro grossly intact  ECG Sinus rhythm with frequent PACs. RV conduction delay. Left ventricular hypertrophy with repolarization abnormality.

## 2013-09-05 NOTE — Assessment & Plan Note (Signed)
Continue present medications. Managed by primary care.

## 2013-09-05 NOTE — Assessment & Plan Note (Signed)
Patient has declined ICD in the past. They understand risk of sudden death. I discussed the need for his siblings to be screened for hypertrophic cardiomyopathy. Continue beta blocker.

## 2013-09-05 NOTE — Assessment & Plan Note (Signed)
Patient counseled on discontinuing. 

## 2013-12-07 ENCOUNTER — Encounter: Payer: Self-pay | Admitting: Gastroenterology

## 2014-03-14 ENCOUNTER — Ambulatory Visit (INDEPENDENT_AMBULATORY_CARE_PROVIDER_SITE_OTHER): Payer: Medicare Other | Admitting: Internal Medicine

## 2014-03-14 ENCOUNTER — Encounter: Payer: Self-pay | Admitting: Internal Medicine

## 2014-03-14 VITALS — BP 134/81 | HR 60 | Temp 98.0°F | Wt 126.6 lb

## 2014-03-14 DIAGNOSIS — Z720 Tobacco use: Secondary | ICD-10-CM

## 2014-03-14 DIAGNOSIS — I10 Essential (primary) hypertension: Secondary | ICD-10-CM

## 2014-03-14 DIAGNOSIS — F17200 Nicotine dependence, unspecified, uncomplicated: Secondary | ICD-10-CM

## 2014-03-14 DIAGNOSIS — Z23 Encounter for immunization: Secondary | ICD-10-CM

## 2014-03-14 DIAGNOSIS — Z0000 Encounter for general adult medical examination without abnormal findings: Secondary | ICD-10-CM

## 2014-03-14 DIAGNOSIS — E785 Hyperlipidemia, unspecified: Secondary | ICD-10-CM

## 2014-03-14 NOTE — Assessment & Plan Note (Signed)
Ian Hill got the flu vaccine today but refused the zostavax Rx

## 2014-03-14 NOTE — Assessment & Plan Note (Signed)
BP Readings from Last 3 Encounters:  03/14/14 134/81  09/05/13 128/80  03/23/13 127/73    Lab Results  Component Value Date   NA 139 03/23/2013   K 4.3 03/23/2013   CREATININE 1.24 03/23/2013    Assessment: Blood pressure control: controlled Progress toward BP goal:  at goal Comments: none  Plan: Medications:  continue current medications atenolol 25 mg BID, HCTZ 25 mg daily Educational resources provided: brochure Self management tools provided: home blood pressure logbook Other plans: check BMET

## 2014-03-14 NOTE — Assessment & Plan Note (Signed)
  Assessment: Progress toward smoking cessation:  smoking less Barriers to progress toward smoking cessation:  lack of motivation to quit Comments: He says he only smokes 1-2 cigarettes a day  Plan: Instruction/counseling given:  I counseled patient on the dangers of tobacco use, advised patient to stop smoking, and reviewed strategies to maximize success. Educational resources provided:  [facility] [employer] account manager) brochure Self management tools provided:  smoking cessation plan (STAR Quit Plan) Medications to assist with smoking cessation:  None Patient agreed to the following self-care plans for smoking cessation: go to the [employer] (https://scott-booker.info/), call [facility] (1-800-QUIT-NOW)  Other plans: Patient and sister refused any nicotine replacement medications as they think it is unsafe to use concurrent with 1-2 cigarettes a day. I tried to explain to them that it would be okay if it helped him quit but they refused.

## 2014-03-14 NOTE — Progress Notes (Signed)
   Subjective:    Patient ID: MASTER TOUCHET, male    DOB: 03/20/1947, 63 y.o.   MRN: [id number]  HPI  Mr Seoane is a 63 year old man with hypertrophic obstructive cardiomyopathy, HTN presenting for routine care. He has no complaints today and only wants his flu shot.   Review of Systems  Constitutional: Negative for fever, chills and diaphoresis.  Respiratory: Negative for shortness of breath.   Cardiovascular: Negative for chest pain and palpitations.  Gastrointestinal: Negative for nausea, vomiting, abdominal pain, diarrhea and constipation.  Neurological: Negative for dizziness, weakness, light-headedness, numbness and headaches.       Objective:   Physical Exam  Constitutional: [name] oriented to person, place, and time. He appears well-developed and well-nourished. No distress.  HENT:  Head: Normocephalic and atraumatic.  Mouth/Throat: Oropharynx is clear and moist.  Eyes: EOM are normal. Pupils are equal, round, and reactive to light.  Cardiovascular: Normal rate, regular rhythm and intact distal pulses.  Exam reveals no gallop and no friction rub.   2/6 systolic ejection murmur  Pulmonary/Chest: Effort normal and breath sounds normal. No respiratory distress. He has no wheezes.  Abdominal: Soft. Bowel sounds are normal. He exhibits no distension. There is no tenderness.  Musculoskeletal: He exhibits no edema.  Neurological: [name] alert and oriented to person, place, and time.  Skin: [name] not diaphoretic.  Vitals reviewed.         Assessment & Plan:

## 2014-03-14 NOTE — Patient Instructions (Signed)
It was a pleasure to see you today. We will call you about your blood work if there are any abnormal results. Please return to clinic or seek medical attention if you have any new or worsening chest pain, shortness of breath, or other worrisome medical condition. We look forward to seeing you again in one year.  Lottie Mussel, MD  General Instructions:   Please bring your medicines with you each time you come to clinic.  Medicines may include prescription medications, over-the-counter medications, herbal remedies, eye drops, vitamins, or other pills.   Progress Toward Treatment Goals:  Treatment Goal 03/23/2013  Blood pressure at goal  Stop smoking smoking less    Self Care Goals & Plans:  Self Care Goal 03/14/2014  Manage my medications take my medicines as prescribed; bring my medications to every visit  Monitor my health keep track of my blood pressure  Eat healthy foods eat baked foods instead of fried foods; eat foods that are low in salt  Be physically active find an activity I enjoy  Stop smoking go to the [employer] (https://scott-booker.info/); call [facility] (1-800-QUIT-NOW)    No flowsheet data found.   Care Management & Community Referrals:  Referral 03/23/2013  Referrals made for care management support none needed

## 2014-03-14 NOTE — Assessment & Plan Note (Signed)
Last lipid panel from 2012. -check lipid panel -continue simvastatin 40 mg qhs

## 2014-03-15 ENCOUNTER — Telehealth: Payer: Self-pay | Admitting: Internal Medicine

## 2014-03-15 LAB — LIPID PANEL
CHOL/HDL RATIO: 2.7 ratio
CHOLESTEROL: 147 mg/dL (ref 0–200)
HDL: 55 mg/dL (ref >39)
LDL CALC: 75 mg/dL (ref 0–99)
Triglycerides: 84 mg/dL (ref <150)
VLDL: 17 mg/dL (ref 0–40)

## 2014-03-15 LAB — BASIC METABOLIC PANEL WITH GFR
BUN: 21 mg/dL (ref 6–23)
CHLORIDE: 103 meq/L (ref 96–112)
CO2: 27 mEq/L (ref 19–32)
Calcium: 9.2 mg/dL (ref 8.4–10.5)
Creat: 1.31 mg/dL (ref 0.50–1.35)
GFR, EST AFRICAN AMERICAN: 64 mL/min
GFR, EST NON AFRICAN AMERICAN: 56 mL/min — AB
Glucose, Bld: 76 mg/dL (ref 70–99)
Potassium: 4 mEq/L (ref 3.5–5.3)
SODIUM: 140 meq/L (ref 135–145)

## 2014-03-15 NOTE — Telephone Encounter (Signed)
I called Mr Cork at 10:50 am to tell him that his BMP and lipid profile results returned normal and we are making no changes to his medications. He had no questions.  Lottie Mussel, MD 03/15/14 10:54 AM

## 2014-03-22 ENCOUNTER — Telehealth: Payer: Self-pay | Admitting: *Deleted

## 2014-03-22 DIAGNOSIS — I10 Essential (primary) hypertension: Secondary | ICD-10-CM

## 2014-03-22 MED ORDER — ATENOLOL 25 MG PO TABS: 25.0000 mg | ORAL_TABLET | Freq: Two times a day (BID) | ORAL | Status: DC

## 2014-03-22 MED ORDER — HYDROCHLOROTHIAZIDE 25 MG PO TABS: 25.0000 mg | ORAL_TABLET | Freq: Every day | ORAL | Status: DC

## 2014-03-22 NOTE — Telephone Encounter (Signed)
[facility] [place] is requesting refill HCTZ 25mg  #90 - also needs atenolol 25mg  #180. Hilda Blades Shivali Quackenbush RN 03/22/14 10:40AM

## 2014-03-22 NOTE — Progress Notes (Signed)
I saw and evaluated the patient.  I personally confirmed the key portions of the history and exam documented by Dr. Ethelene Hal and I reviewed pertinent patient test results.  The assessment, diagnosis, and plan were formulated together and I agree with the documentation in the resident's note.

## 2014-06-02 ENCOUNTER — Ambulatory Visit: Payer: Medicaid Other | Admitting: Podiatry

## 2014-06-07 ENCOUNTER — Encounter: Payer: Self-pay | Admitting: Podiatry

## 2014-06-07 ENCOUNTER — Ambulatory Visit (INDEPENDENT_AMBULATORY_CARE_PROVIDER_SITE_OTHER): Payer: Medicare Other | Admitting: Podiatry

## 2014-06-07 VITALS — BP 141/80 | HR 61 | Resp 18

## 2014-06-07 DIAGNOSIS — M79676 Pain in unspecified toe(s): Secondary | ICD-10-CM

## 2014-06-07 DIAGNOSIS — B351 Tinea unguium: Secondary | ICD-10-CM

## 2014-06-07 NOTE — Progress Notes (Signed)
   Subjective:    Patient ID: Ian Hill, male    DOB: 03/20/1947, 64 y.o.   MRN: [id number]  HPI  64 year old male presents the office they with his family for complaints of painful, thick, discolored toenails. He states that all of his toenails are painful as they are elongated putting pressure into his shoes which rub. He denies any recent redness or drainage from around the nail sites. States he is unable to trim the nails himself. No other complaints at this time.  Review of Systems  Skin:       Thick toenails   All other systems reviewed and are negative.      Objective:   Physical Exam AAO x3, NAD DP/PT pulses palpable bilaterally, CRT less than 3 seconds Protective sensation intact with Simms Weinstein monofilament, vibratory sensation intact, Achilles tendon reflex intact No areas of tenderness to bilateral lower extremities. MMT 5/5, ROM WNL.  No open lesions or pre-ulcerative lesions.  Nails are hypertrophic, dystrophic, elongated, brittle, discolored 10. There is no surrounding erythema or drainage from around the nail sites. There subjective tenderness upon palpation of all 10 toenails. No overlying edema, erythema, increase in warmth to bilateral lower extremities.  No pain with calf compression, swelling, warmth, erythema bilaterally.       Assessment & Plan:  64 year old male with symptomatic onychomycosis -Treatment options discussed including alternatives, risks, complications. -Nail sharply debrided 10 without complication/bleeding. -Discussed various treatment options for onychomycosis. He'll consider options and possibly start over-the-counter treatment. -Follow-up in 3 months or sooner should any problems arise or any change in symptoms. In the meantime, encouraged to call the office with any questions, concerns, change in symptoms.

## 2014-06-21 ENCOUNTER — Other Ambulatory Visit: Payer: Self-pay | Admitting: *Deleted

## 2014-06-22 MED ORDER — SIMVASTATIN 40 MG PO TABS: 40.0000 mg | ORAL_TABLET | Freq: Every day | ORAL | Status: DC

## 2014-08-31 NOTE — Progress Notes (Signed)
      HPI: FU hypertrophic cardiomyopathy. Abdominal ultrasound in August of 2011 showed no aneurysm. Carotid Dopplers showed 0-39% bilateral stenosis. Holter monitor showed nonsustained ventricular tachycardia (7 beats). Stress echocardiogram showed possible inferior hypokinesis but systolic blood pressure decreased with exercise. We discussed referral for ICD but the family ultimately declined understanding risk of sudden cardiac death. Last echocardiogram in Mar 07, 2013 showed normal LV function, grade 2 diastolic dysfunction, mild biatrial enlargement, mild aortic and mitral regurgitation. There is probable apical variant cardiomyopathy. Since I last saw him the patient denies any dyspnea on exertion, orthopnea, PND, pedal edema, palpitations, syncope or chest pain.   Current Outpatient Prescriptions  Medication Sig Dispense Refill  . atenolol (TENORMIN) 25 MG tablet Take 1 tablet (25 mg total) by mouth 2 (two) times daily. 180 tablet 3  . hydrochlorothiazide (HYDRODIURIL) 25 MG tablet Take 1 tablet (25 mg total) by mouth daily. 90 tablet 3  . simvastatin (ZOCOR) 40 MG tablet Take 1 tablet (40 mg total) by mouth at bedtime. 90 tablet 1   No current facility-administered medications for this visit.     Past Medical History  Diagnosis Date  . Hypertrophic obstructive cardiomyopathy   . Mitral regurgitation   . Hypertension   . Dyslipidemia   . Diastolic dysfunction   . Elevated PSA   . Diverticulosis   . Aspergilloma     Past Surgical History  Procedure Laterality Date  . Aspergilloma resection      LUL, s/p resection Feb 1991    History   Social History  . Marital Status: Single    Spouse Name: N/A  . Number of Children: N/A  . Years of Education: N/A   Occupational History  . Not on file.   Social History Main Topics  . Smoking status: Current Some Day Smoker    Types: Cigarettes  . Smokeless tobacco: Not on file  . Alcohol Use: No  . Drug Use: No  . Sexual  Activity: Not on file   Other Topics Concern  . Not on file   Social History Narrative    ROS: no fevers or chills, productive cough, hemoptysis, dysphasia, odynophagia, melena, hematochezia, dysuria, hematuria, rash, seizure activity, orthopnea, PND, pedal edema, claudication. Remaining systems are negative.  Physical Exam: Well-developed well-nourished in no acute distress.  Skin is warm and dry.  HEENT is normal.  Neck is supple.  Chest is clear to auscultation with normal expansion.  Cardiovascular exam is regular rate and rhythm. 2/6 systolic murmur left sternal border. Abdominal exam nontender or distended. No masses palpated. Extremities show no edema. neuro grossly intact  ECG sinus rhythm at a rate of 92. Normal axis. Left ventricular hypertrophy with repolarization abnormality.

## 2014-09-04 ENCOUNTER — Encounter: Payer: Self-pay | Admitting: *Deleted

## 2014-09-06 ENCOUNTER — Ambulatory Visit (INDEPENDENT_AMBULATORY_CARE_PROVIDER_SITE_OTHER): Payer: Medicare Other | Admitting: Cardiology

## 2014-09-06 ENCOUNTER — Encounter: Payer: Self-pay | Admitting: Cardiology

## 2014-09-06 VITALS — BP 142/76 | HR 92 | Ht 68.0 in | Wt 124.3 lb

## 2014-09-06 DIAGNOSIS — E785 Hyperlipidemia, unspecified: Secondary | ICD-10-CM | POA: Diagnosis not present

## 2014-09-06 DIAGNOSIS — I421 Obstructive hypertrophic cardiomyopathy: Secondary | ICD-10-CM | POA: Diagnosis not present

## 2014-09-06 DIAGNOSIS — Z720 Tobacco use: Secondary | ICD-10-CM

## 2014-09-06 DIAGNOSIS — I10 Essential (primary) hypertension: Secondary | ICD-10-CM

## 2014-09-06 DIAGNOSIS — F17200 Nicotine dependence, unspecified, uncomplicated: Secondary | ICD-10-CM

## 2014-09-06 MED ORDER — SIMVASTATIN 40 MG PO TABS: 40.0000 mg | ORAL_TABLET | Freq: Every day | ORAL | Status: DC

## 2014-09-06 MED ORDER — ATENOLOL 25 MG PO TABS: 25.0000 mg | ORAL_TABLET | Freq: Two times a day (BID) | ORAL | Status: DC

## 2014-09-06 MED ORDER — HYDROCHLOROTHIAZIDE 25 MG PO TABS
25.0000 mg | ORAL_TABLET | Freq: Every day | ORAL | Status: DC
Start: 2014-09-06 — End: 2015-06-11

## 2014-09-06 NOTE — Patient Instructions (Signed)
Your physician wants you to follow-up in: ONE YEAR WITH DR CRENSHAW You will receive a reminder letter in the mail two months in advance. If you don't receive a letter, please call our office to schedule the follow-up appointment.  

## 2014-09-06 NOTE — Assessment & Plan Note (Signed)
Management per primary care. 

## 2014-09-06 NOTE — Assessment & Plan Note (Signed)
Patient has declined ICD in the past. They understand risk of sudden death. Continue beta blocker.

## 2014-09-06 NOTE — Assessment & Plan Note (Signed)
Blood pressure controlled. Continue present medications. Potassium and renal function monitored by primary care. 

## 2014-09-06 NOTE — Assessment & Plan Note (Signed)
Patient counseled on discontinuing. 

## 2014-10-27 ENCOUNTER — Emergency Department (HOSPITAL_COMMUNITY): Payer: Medicare Other

## 2014-10-27 ENCOUNTER — Emergency Department (HOSPITAL_COMMUNITY)
Admission: EM | Admit: 2014-10-27 | Discharge: 2014-10-27 | Disposition: A | Discharge status: Home / Self Care | Payer: Medicare Other | Attending: Emergency Medicine | Admitting: Emergency Medicine

## 2014-10-27 DIAGNOSIS — Z8619 Personal history of other infectious and parasitic diseases: Secondary | ICD-10-CM | POA: Diagnosis not present

## 2014-10-27 DIAGNOSIS — Y92410 Unspecified street and highway as the place of occurrence of the external cause: Secondary | ICD-10-CM | POA: Insufficient documentation

## 2014-10-27 DIAGNOSIS — S20211A Contusion of right front wall of thorax, initial encounter: Secondary | ICD-10-CM

## 2014-10-27 DIAGNOSIS — S299XXA Unspecified injury of thorax, initial encounter: Secondary | ICD-10-CM | POA: Diagnosis present

## 2014-10-27 DIAGNOSIS — I10 Essential (primary) hypertension: Secondary | ICD-10-CM | POA: Insufficient documentation

## 2014-10-27 DIAGNOSIS — Y999 Unspecified external cause status: Secondary | ICD-10-CM | POA: Diagnosis not present

## 2014-10-27 DIAGNOSIS — Z79899 Other long term (current) drug therapy: Secondary | ICD-10-CM | POA: Diagnosis not present

## 2014-10-27 DIAGNOSIS — Z720 Tobacco use: Secondary | ICD-10-CM | POA: Diagnosis not present

## 2014-10-27 DIAGNOSIS — E785 Hyperlipidemia, unspecified: Secondary | ICD-10-CM | POA: Diagnosis not present

## 2014-10-27 DIAGNOSIS — S8991XA Unspecified injury of right lower leg, initial encounter: Secondary | ICD-10-CM | POA: Diagnosis not present

## 2014-10-27 DIAGNOSIS — Y939 Activity, unspecified: Secondary | ICD-10-CM | POA: Insufficient documentation

## 2014-10-27 DIAGNOSIS — Z8719 Personal history of other diseases of the digestive system: Secondary | ICD-10-CM | POA: Diagnosis not present

## 2014-10-27 MED ORDER — IBUPROFEN 800 MG PO TABS: 800.0000 mg | ORAL_TABLET | Freq: Three times a day (TID) | ORAL | Status: DC
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
## 2015-01-31 ENCOUNTER — Encounter: Payer: Self-pay | Admitting: Internal Medicine

## 2015-01-31 ENCOUNTER — Ambulatory Visit (INDEPENDENT_AMBULATORY_CARE_PROVIDER_SITE_OTHER): Payer: Medicare Other | Admitting: Internal Medicine

## 2015-01-31 VITALS — BP 138/63 | HR 58 | Temp 98.1°F | Wt 122.0 lb

## 2015-01-31 DIAGNOSIS — E785 Hyperlipidemia, unspecified: Secondary | ICD-10-CM

## 2015-01-31 DIAGNOSIS — Z0000 Encounter for general adult medical examination without abnormal findings: Secondary | ICD-10-CM | POA: Diagnosis not present

## 2015-01-31 DIAGNOSIS — Z720 Tobacco use: Secondary | ICD-10-CM | POA: Diagnosis not present

## 2015-01-31 DIAGNOSIS — F17200 Nicotine dependence, unspecified, uncomplicated: Secondary | ICD-10-CM

## 2015-01-31 DIAGNOSIS — I421 Obstructive hypertrophic cardiomyopathy: Secondary | ICD-10-CM

## 2015-01-31 DIAGNOSIS — I10 Essential (primary) hypertension: Secondary | ICD-10-CM

## 2015-01-31 DIAGNOSIS — Z23 Encounter for immunization: Secondary | ICD-10-CM | POA: Diagnosis not present

## 2015-01-31 NOTE — Assessment & Plan Note (Signed)
Followed by cardiology. Currently on Atenolol 25 mg daily. Continue current medications.

## 2015-01-31 NOTE — Assessment & Plan Note (Signed)
  Assessment: Progress toward smoking cessation:   smoking the same Barriers to progress toward smoking cessation:    Comments: He says he smokes 1 cigarette a day  Plan: Instruction/counseling given:  I counseled patient on the dangers of tobacco use, advised patient to stop smoking, and reviewed strategies to maximize success. Educational resources provided:    Self management tools provided:    Medications to assist with smoking cessation:  None Patient agreed to the following self-care plans for smoking cessation: 1-800-QUIT-NOW Other plans: Reassess at next visit

## 2015-01-31 NOTE — Progress Notes (Signed)
Internal Medicine Clinic Attending  I saw and evaluated the patient.  I personally confirmed the key portions of the history and exam documented by Dr. Boswell and I reviewed pertinent patient test results.  The assessment, diagnosis, and plan were formulated together and I agree with the documentation in the resident's note. 

## 2015-01-31 NOTE — Assessment & Plan Note (Signed)
Received flu shot today. Not interested in getting zostavax today.

## 2015-01-31 NOTE — Assessment & Plan Note (Signed)
BP Readings from Last 3 Encounters:  01/31/15 138/63  10/27/14 139/101  09/06/14 142/76    Lab Results  Component Value Date   NA 140 03/14/2014   K 4.0 03/14/2014   CREATININE 1.31 03/14/2014    Assessment: Blood pressure control:  controlled Progress toward BP goal:   at goal Comments: Patient currently on HCTZ 25 mg daily and atenolol 25 mg bid  Plan: Medications:  continue current medications Educational resources provided:   Self management tools provided:   Other plans: Will check BMET today

## 2015-01-31 NOTE — Assessment & Plan Note (Signed)
Patient currently on Simvastatin 40 mg daily. Last lipid panel in 03/2014 with LDL of 75. No current recommendations for target LDL. Continue current dose of statin.

## 2015-01-31 NOTE — Progress Notes (Signed)
Patient ID: Ian Hill, male   DOB: 03/20/1947, 64 y.o.   MRN: [id number]   Subjective:   Patient ID: Ian Hill male   DOB: 03/20/1947 64 y.o.   MRN: [id number]  HPI: [name] is a 64 y.o. male with a PMH listed below is here today for routine clinic follow up for his chronic medical problems. Please see the A&P for the status of his chronic medical problems and details of today's visit.  Past Medical History  Diagnosis Date  . Hypertrophic obstructive cardiomyopathy   . Mitral regurgitation   . Hypertension   . Dyslipidemia   . Diastolic dysfunction   . Elevated PSA   . Diverticulosis   . Aspergilloma    Current Outpatient Prescriptions  Medication Sig Dispense Refill  . atenolol (TENORMIN) 25 MG tablet Take 1 tablet (25 mg total) by mouth 2 (two) times daily. 180 tablet 3  . hydrochlorothiazide (HYDRODIURIL) 25 MG tablet Take 1 tablet (25 mg total) by mouth daily. 90 tablet 3  . ibuprofen (ADVIL,MOTRIN) 800 MG tablet Take 1 tablet (800 mg total) by mouth 3 (three) times daily. 21 tablet 0  . simvastatin (ZOCOR) 40 MG tablet Take 1 tablet (40 mg total) by mouth at bedtime. 90 tablet 1   No current facility-administered medications for this visit.   No family history on file. Social History   Social History  . Marital Status: Single    Spouse Name: N/A  . Number of Children: N/A  . Years of Education: N/A   Social History Main Topics  . Smoking status: Current Some Day Smoker    Types: Cigarettes  . Smokeless tobacco: None     Comment: Cutting back.  . Alcohol Use: No  . Drug Use: No  . Sexual Activity: Not Asked   Other Topics Concern  . None   Social History Narrative   Review of Systems: Review of Systems  Constitutional: Negative for fever and chills.  Respiratory: Negative for cough, shortness of breath and wheezing.   Cardiovascular: Negative for chest pain, palpitations and leg swelling.  Gastrointestinal: Negative for nausea,  vomiting, abdominal pain, diarrhea and constipation.  Genitourinary: Positive for frequency. Negative for dysuria, urgency and hematuria.  Skin: Negative for rash.  Neurological: Negative for dizziness.   Objective:  Physical Exam: Filed Vitals:   01/31/15 1329  BP: 138/63  Pulse: 58  Temp: 98.1 F (36.7 C)  TempSrc: Oral  Weight: 122 lb (55.339 kg)  SpO2: 100%   Physical Exam GENERAL- alert, co-operative, appears as stated age, not in any distress. HEENT- Atraumatic, normocephalic, PERRL, EOMI CARDIAC- RRR, 2/6 systolic ejection murmur loudest at LSB, no rubs or gallops RESP- Moving equal volumes of air, and clear to auscultation bilaterally, no wheezes or crackles. ABDOMEN- Soft, nontender, bowel sounds present. NEURO- No obvious Cr N abnormality EXTREMITIES- pulse 2+, symmetric, no pedal edema. SKIN- Warm, dry, No rash or lesion. PSYCH- Normal mood and affect, appropriate thought content and speech.  Assessment & Plan:   Case discussed with Dr. Ellwood Dense. Please refer to Problem based carting for further details of today's visit.

## 2015-01-31 NOTE — Patient Instructions (Signed)
Thank you for coming in today - I am checking to some routine labs on you today. If anything comes back abnormal I will give you a call. - Call 1-800-QUIT-NOW for more resources and information about quitting smoking - Follow up in 6 months

## 2015-02-01 LAB — BASIC METABOLIC PANEL
BUN/Creatinine Ratio: 13 (ref 10–22)
BUN: 16 mg/dL (ref 8–27)
CHLORIDE: 98 mmol/L (ref 97–108)
CO2: 27 mmol/L (ref 18–29)
Calcium: 9.8 mg/dL (ref 8.6–10.2)
Creatinine, Ser: 1.25 mg/dL (ref 0.76–1.27)
GFR calc non Af Amer: 58 mL/min/{1.73_m2} — ABNORMAL LOW (ref >59)
GFR, EST AFRICAN AMERICAN: 67 mL/min/{1.73_m2} (ref >59)
Glucose: 68 mg/dL (ref 65–99)
POTASSIUM: 4.4 mmol/L (ref 3.5–5.2)
Sodium: 143 mmol/L (ref 134–144)

## 2015-02-01 LAB — CBC
HEMATOCRIT: 44.7 % (ref 37.5–51.0)
Hemoglobin: 14.1 g/dL (ref 12.6–17.7)
MCH: 28.7 pg (ref 26.6–33.0)
MCHC: 31.5 g/dL (ref 31.5–35.7)
MCV: 91 fL (ref 79–97)
Platelets: 146 10*3/uL — ABNORMAL LOW (ref 150–379)
RBC: 4.92 x10E6/uL (ref 4.14–5.80)
RDW: 14.1 % (ref 12.3–15.4)
WBC: 4.7 10*3/uL (ref 3.4–10.8)

## 2015-06-11 ENCOUNTER — Other Ambulatory Visit: Payer: Self-pay | Admitting: *Deleted

## 2015-06-11 DIAGNOSIS — I10 Essential (primary) hypertension: Secondary | ICD-10-CM

## 2015-06-11 NOTE — Telephone Encounter (Signed)
Pharmacy has DOB as 2050-08-19 I talked with pharmacist and he will f/u with pt.   Medications are the same and home address is the same

## 2015-06-13 MED ORDER — ATENOLOL 25 MG PO TABS: 25.0000 mg | ORAL_TABLET | Freq: Two times a day (BID) | ORAL | Status: DC

## 2015-06-13 MED ORDER — HYDROCHLOROTHIAZIDE 25 MG PO TABS: 25.0000 mg | ORAL_TABLET | Freq: Every day | ORAL | Status: DC

## 2015-06-19 ENCOUNTER — Other Ambulatory Visit: Payer: Self-pay | Admitting: Internal Medicine

## 2015-06-19 NOTE — Telephone Encounter (Signed)
Pt requesting simvastatin to be filled @ [facility] [address].

## 2015-06-21 ENCOUNTER — Other Ambulatory Visit: Payer: Self-pay | Admitting: Internal Medicine

## 2015-06-21 NOTE — Telephone Encounter (Signed)
This has already been requested

## 2015-06-21 NOTE — Telephone Encounter (Signed)
Pt requesting simvastatin to be filled.

## 2015-06-23 MED ORDER — SIMVASTATIN 40 MG PO TABS: 40.0000 mg | ORAL_TABLET | Freq: Every day | ORAL | Status: DC

## 2015-07-18 ENCOUNTER — Ambulatory Visit (INDEPENDENT_AMBULATORY_CARE_PROVIDER_SITE_OTHER): Payer: Medicare Other | Admitting: Internal Medicine

## 2015-07-18 ENCOUNTER — Encounter: Payer: Self-pay | Admitting: Internal Medicine

## 2015-07-18 ENCOUNTER — Ambulatory Visit (HOSPITAL_COMMUNITY): Payer: Medicare Other | Attending: Internal Medicine

## 2015-07-18 VITALS — BP 128/80 | HR 64 | Temp 97.6°F | Ht 68.0 in | Wt 121.5 lb

## 2015-07-18 DIAGNOSIS — I421 Obstructive hypertrophic cardiomyopathy: Secondary | ICD-10-CM | POA: Diagnosis present

## 2015-07-18 DIAGNOSIS — Z79899 Other long term (current) drug therapy: Secondary | ICD-10-CM

## 2015-07-18 DIAGNOSIS — I10 Essential (primary) hypertension: Secondary | ICD-10-CM | POA: Diagnosis not present

## 2015-07-18 MED ORDER — HYDROCHLOROTHIAZIDE 25 MG PO TABS: 25.0000 mg | ORAL_TABLET | Freq: Every day | ORAL | Status: DC

## 2015-07-18 MED ORDER — SIMVASTATIN 40 MG PO TABS
40.0000 mg | ORAL_TABLET | Freq: Every day | ORAL | Status: DC
Start: 2015-07-18 — End: 2016-02-13

## 2015-07-18 MED ORDER — ATENOLOL 25 MG PO TABS: 25.0000 mg | ORAL_TABLET | Freq: Every day | ORAL | Status: DC

## 2015-07-18 NOTE — Assessment & Plan Note (Signed)
Will be due for colonoscopy by next visit.

## 2015-07-18 NOTE — Assessment & Plan Note (Addendum)
Pulse was regularly irregular on exam. He has no palpitations, pulse- 64. Past EKgs- 08/2014 should premature Atria contractions.   Plan- Obtained EKG- which should PACs, with deep inverted t waves in lateral and inferior leads, which are old, not not as deep. Rate- 70bpm, normal intervals.  No syncopal episodes, no dizzines or SOB - Continue Atenolol 25mg  daily.(he has been taking it once a day, unsure why prescriptions were written BID)

## 2015-07-18 NOTE — Assessment & Plan Note (Signed)
BP Readings from Last 3 Encounters:  07/18/15 128/80  01/31/15 138/63  10/27/14 139/101    Lab Results  Component Value Date   NA 143 01/31/2015   K 4.4 01/31/2015   CREATININE 1.25 01/31/2015    Assessment: Blood pressure control:  Contyrolled Comments: Cont Atenolol 25mg  daily, and HCTZ- 25mg  daily  Plan:Other plans: Bmet today.

## 2015-07-18 NOTE — Progress Notes (Signed)
Patient ID: Ian Hill, male   DOB: 03/20/1947, 65 y.o.   MRN: [id number]   Subjective:   Patient ID: Ian Hill male   DOB: 03/20/1947 65 y.o.   MRN: [id number]  HPI: [name] is a 65 y.o. with PMH of HOCM, HTN, Dyslipidemia. Presented today to follow up on his HTN, dyslipidemia. And get refills on meds. Please see problem based charting for assessment and plan.  Past Medical History  Diagnosis Date  . Hypertrophic obstructive cardiomyopathy   . Mitral regurgitation   . Hypertension   . Dyslipidemia   . Diastolic dysfunction   . Elevated PSA   . Diverticulosis   . 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 SKIN- No Rash, colour changes or itching. HEAD- No Headache or dizziness. Mouth/throat- No Sorethroat,  or bleeding gums. RESPIRATORY- No Cough or SOB. CARDIAC- No Palpitations, or chest pain. GI- No  vomiting, diarrhoea,  abd pain. URINARY- No Frequency, urgency, straining or dysuria. NEUROLOGIC- No Numbness, or syncope  Objective:  Physical Exam: Filed Vitals:   07/18/15 1035  BP: 128/80  Pulse: 64  Temp: 97.6 F (36.4 C)  TempSrc: Oral  Height: 5\' 8"  (1.727 m)  Weight: 121 lb 8 oz (55.112 kg)  SpO2: 100%   GENERAL- alert, co-operative, appears as stated age, sister present provides most of his hx, she says at baseline, pt gets easily confused.  HEENT- Atraumatic, normocephalic, PERRL, oral mucosa appears moist CARDIAC- regularly irregular pulse,, no murmurs apprecaited RESP- Moving equal volumes of air, and no wheezes or crackles. ABDOMEN- Soft, nontender, bowel sounds present. NEURO- alert and oriented, Gait- Normal. EXTREMITIES-warm and well perfused, no pedal edema. PSYCH- Normal mood and affect, responds appropriately to questions, but sister answers most as she says she goes with him for all his appointments.  Assessment & Plan:  The patient's case and plan of care was  discussed with attending physician, Dr. Lalla Brothers.  Please see problem based charting for assessment and plan.

## 2015-07-18 NOTE — Patient Instructions (Signed)
We will see you in 6 months, you will be due for a repeat colonoscopy then.

## 2015-07-19 LAB — BMP8+ANION GAP
ANION GAP: 17 mmol/L (ref 10.0–18.0)
BUN/Creatinine Ratio: 18 (ref 10–22)
BUN: 23 mg/dL (ref 8–27)
CHLORIDE: 99 mmol/L (ref 96–106)
CO2: 27 mmol/L (ref 18–29)
CREATININE: 1.31 mg/dL — AB (ref 0.76–1.27)
Calcium: 9.5 mg/dL (ref 8.6–10.2)
GFR calc Af Amer: 63 mL/min/{1.73_m2} (ref >59)
GFR calc non Af Amer: 55 mL/min/{1.73_m2} — ABNORMAL LOW (ref >59)
GLUCOSE: 79 mg/dL (ref 65–99)
POTASSIUM: 4.1 mmol/L (ref 3.5–5.2)
SODIUM: 143 mmol/L (ref 134–144)

## 2015-07-20 NOTE — Progress Notes (Signed)
[facility] Clinic Attending  Case discussed with Dr. Emokpae at the time of the visit.  We reviewed the resident's history and exam and pertinent patient test results.  I agree with the assessment, diagnosis, and plan of care documented in the resident's note.  

## 2015-11-14 ENCOUNTER — Encounter: Payer: Self-pay | Admitting: Gastroenterology

## 2015-11-16 ENCOUNTER — Encounter: Payer: Self-pay | Admitting: *Deleted

## 2015-12-14 ENCOUNTER — Encounter: Payer: Self-pay | Admitting: Podiatry

## 2015-12-14 ENCOUNTER — Ambulatory Visit (INDEPENDENT_AMBULATORY_CARE_PROVIDER_SITE_OTHER): Payer: Medicaid Other | Admitting: Podiatry

## 2015-12-14 DIAGNOSIS — B351 Tinea unguium: Secondary | ICD-10-CM

## 2015-12-14 DIAGNOSIS — M79676 Pain in unspecified toe(s): Secondary | ICD-10-CM

## 2015-12-17 ENCOUNTER — Telehealth: Payer: Self-pay | Admitting: Internal Medicine

## 2015-12-17 DIAGNOSIS — I10 Essential (primary) hypertension: Secondary | ICD-10-CM

## 2015-12-17 DIAGNOSIS — I421 Obstructive hypertrophic cardiomyopathy: Secondary | ICD-10-CM

## 2015-12-17 NOTE — Telephone Encounter (Signed)
hydrochlorothiazide (HYDRODIURIL) 25 MG tablet  simvastatin (ZOCOR) 40 MG tablet [facility]

## 2015-12-17 NOTE — Telephone Encounter (Signed)
Spoke with pharmacy this AM who was unable to find patient by DOB and name. Found correct patient by name, phone, number, and address, but birth date on file did not match our system. Spoke with pt. Sister who verified that the birthday we have on file is correct (03/20/1947) and she said that she has been struggling with [facility] to correct the incorrect birthday in their system. Contacted [facility] and they found that they have the incorrect birthday because the patient's Medicare profile has the incorrect birthday. Medicaid profile is correct. Contacted [facility] and let her know the problem. Provided her with the Medicare contact information and explained that she needed to let them know that his birth date was incorrect and have it changed. She agreed. Pharmacy located refills on file for patient to fill.

## 2015-12-17 NOTE — Telephone Encounter (Signed)
Spoke with patient who requested that I call back at 11:00 to talk with his sister.

## 2015-12-21 NOTE — Progress Notes (Signed)
Subjective: 65 y.o. returns the office today for painful, elongated, thickened toenails which he cannot trim himself. Denies any redness or drainage around the nails. Denies any acute changes since last appointment and no new complaints today. Denies any systemic complaints such as fevers, chills, nausea, vomiting.   Objective: AAO 3, NAD DP/PT pulses palpable, CRT less than 3 seconds Nails hypertrophic, dystrophic, elongated, brittle, discolored 10. There is tenderness overlying the nails 1-5 bilaterally. There is no surrounding erythema or drainage along the nail sites. No open lesions or pre-ulcerative lesions are identified. No other areas of tenderness bilateral lower extremities. No overlying edema, erythema, increased warmth. No pain with calf compression, swelling, warmth, erythema.  Assessment: Patient presents with symptomatic onychomycosis  Plan: -Treatment options including alternatives, risks, complications were discussed -Nails sharply debrided 10 without complication/bleeding. -Discussed daily foot inspection. If there are any changes, to call the office immediately.  -Follow-up in 3 months or sooner if any problems are to arise. In the meantime, encouraged to call the office with any questions, concerns, changes symptoms.  Celesta Gentile, DPM

## 2016-01-07 DIAGNOSIS — H01021 Squamous blepharitis right upper eyelid: Secondary | ICD-10-CM | POA: Diagnosis not present

## 2016-01-07 DIAGNOSIS — H01025 Squamous blepharitis left lower eyelid: Secondary | ICD-10-CM | POA: Diagnosis not present

## 2016-01-07 DIAGNOSIS — H2513 Age-related nuclear cataract, bilateral: Secondary | ICD-10-CM | POA: Diagnosis not present

## 2016-01-07 DIAGNOSIS — H01022 Squamous blepharitis right lower eyelid: Secondary | ICD-10-CM | POA: Diagnosis not present

## 2016-01-07 DIAGNOSIS — H01024 Squamous blepharitis left upper eyelid: Secondary | ICD-10-CM | POA: Diagnosis not present

## 2016-01-16 ENCOUNTER — Encounter: Payer: Medicare Other | Admitting: Internal Medicine

## 2016-02-13 ENCOUNTER — Encounter: Payer: Self-pay | Admitting: Internal Medicine

## 2016-02-13 ENCOUNTER — Ambulatory Visit (INDEPENDENT_AMBULATORY_CARE_PROVIDER_SITE_OTHER): Payer: Medicare Other | Admitting: Internal Medicine

## 2016-02-13 VITALS — BP 142/58 | HR 67 | Temp 97.4°F | Ht 71.5 in | Wt 122.6 lb

## 2016-02-13 DIAGNOSIS — N183 Chronic kidney disease, stage 3 (moderate): Secondary | ICD-10-CM

## 2016-02-13 DIAGNOSIS — R0989 Other specified symptoms and signs involving the circulatory and respiratory systems: Secondary | ICD-10-CM

## 2016-02-13 DIAGNOSIS — Z23 Encounter for immunization: Secondary | ICD-10-CM

## 2016-02-13 DIAGNOSIS — F17210 Nicotine dependence, cigarettes, uncomplicated: Secondary | ICD-10-CM | POA: Diagnosis not present

## 2016-02-13 DIAGNOSIS — I129 Hypertensive chronic kidney disease with stage 1 through stage 4 chronic kidney disease, or unspecified chronic kidney disease: Secondary | ICD-10-CM

## 2016-02-13 DIAGNOSIS — H6123 Impacted cerumen, bilateral: Secondary | ICD-10-CM

## 2016-02-13 DIAGNOSIS — Z79899 Other long term (current) drug therapy: Secondary | ICD-10-CM

## 2016-02-13 DIAGNOSIS — E785 Hyperlipidemia, unspecified: Secondary | ICD-10-CM

## 2016-02-13 DIAGNOSIS — N182 Chronic kidney disease, stage 2 (mild): Secondary | ICD-10-CM

## 2016-02-13 DIAGNOSIS — Z0000 Encounter for general adult medical examination without abnormal findings: Secondary | ICD-10-CM

## 2016-02-13 DIAGNOSIS — F17200 Nicotine dependence, unspecified, uncomplicated: Secondary | ICD-10-CM

## 2016-02-13 DIAGNOSIS — I10 Essential (primary) hypertension: Secondary | ICD-10-CM

## 2016-02-13 MED ORDER — ATENOLOL 25 MG PO TABS: 25.0000 mg | ORAL_TABLET | Freq: Every day | ORAL | 2 refills | Status: DC

## 2016-02-13 MED ORDER — HYDROCHLOROTHIAZIDE 25 MG PO TABS: 25.0000 mg | ORAL_TABLET | Freq: Every day | ORAL | 2 refills | Status: DC

## 2016-02-13 MED ORDER — SIMVASTATIN 40 MG PO TABS: 40.0000 mg | ORAL_TABLET | Freq: Every day | ORAL | 2 refills | Status: DC

## 2016-02-13 NOTE — Progress Notes (Signed)
   CC: HTN follow up  HPI:  [name] is a 65 y.o. male with a past medical history listed below here today for follow up of his HTN.   For details of today's visit and the status of his chronic medical issues please refer to the assessment and plan.   Past Medical History:  Diagnosis Date  . 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 Diastolic dysfunction   . Diverticulosis   . Dyslipidemia   . Elevated PSA   . Hypertension   . Hypertr obst cardiomyop   . Mitral regurgitation     Review of Systems:  Review of Systems  Constitutional: Negative for chills and fever.  HENT: Negative for hearing loss.   Eyes: Negative for blurred vision and double vision.  Cardiovascular: Negative for claudication.  Neurological: Negative for dizziness.  Psychiatric/Behavioral: Negative for hallucinations.   Physical Exam:  Vitals:   02/13/16 1403  BP: (!) 142/58  Pulse: 67  Temp: 97.4 F (36.3 C)  TempSrc: Oral  SpO2: 100%  Weight: 122 lb 9.6 oz (55.6 kg)  Height: 5' 11.5" (1.816 m)   GENERAL- alert, co-operative, appears as stated age, not in any distress. HEENT- Cerumen impaction in bilateral ears, unable to visualize TP CARDIAC- regularly irregular pulse, no murmurs, rubs or gallops RESP- Moving equal volumes of air, and clear to auscultation bilaterally, no wheezes or crackles.  EXTREMITIES- unable to palpated dorsalis pedis and posterior tibial pulses bilaterally SKIN- Warm, dry, No rash or lesion. PSYCH- Normal mood and affect, appropriate thought content and speech.   Assessment & Plan:   See Encounters Tab for problem based charting.  Patient discussed with Dr. Eppie Gibson

## 2016-02-13 NOTE — Patient Instructions (Signed)
Thank you for coming in today. I sent in refills for you medications. I would like to see you back in a year or earlier if you have any problems.  I am sending a referral to GI to schedule your colonoscopy. They will call you with an appointment.   Carbamide Peroxide ear solution What is this medicine? CARBAMIDE PEROXIDE (CAR bah mide per OX ide) is used to soften and help remove ear wax. This medicine may be used for other purposes; ask your health care provider or pharmacist if you have questions. What should I tell my health care provider before I take this medicine? They need to know if you have any of these conditions: -dizziness -ear discharge -ear pain, irritation or rash -infection -perforated eardrum (hole in eardrum) -an unusual or allergic reaction to carbamide peroxide, glycerin, hydrogen peroxide, other medicines, foods, dyes, or preservatives -pregnant or trying to get pregnant -breast-feeding How should I use this medicine? This medicine is only for use in the outer ear canal. Follow the directions carefully. Wash hands before and after use. The solution may be warmed by holding the bottle in the hand for 1 to 2 minutes. Lie with the affected ear facing upward. Place the proper number of drops into the ear canal. After the drops are instilled, remain lying with the affected ear upward for 5 minutes to help the drops stay in the ear canal. A cotton ball may be gently inserted at the ear opening for no longer than 5 to 10 minutes to ensure retention. Repeat, if necessary, for the opposite ear. Do not touch the tip of the dropper to the ear, fingertips, or other surface. Do not rinse the dropper after use. Keep container tightly closed. Talk to your pediatrician regarding the use of this medicine in children. While this drug may be used in children as young as 12 years for selected conditions, precautions do apply. Overdosage: If you think you have taken too much of this medicine  contact a poison control center or emergency room at once. NOTE: This medicine is only for you. Do not share this medicine with others. What if I miss a dose? If you miss a dose, use it as soon as you can. If it is almost time for your next dose, use only that dose. Do not use double or extra doses. What may interact with this medicine? Interactions are not expected. Do not use any other ear products without asking your doctor or health care professional. This list may not describe all possible interactions. Give your health care provider a list of all the medicines, herbs, non-prescription drugs, or dietary supplements you use. Also tell them if you smoke, drink alcohol, or use illegal drugs. Some items may interact with your medicine. What should I watch for while using this medicine? This medicine is not for long-term use. Do not use for more than 4 days without checking with your health care professional. Contact your doctor or health care professional if your condition does not start to get better within a few days or if you notice burning, redness, itching or swelling. What side effects may I notice from receiving this medicine? Side effects that you should report to your doctor or health care professional as soon as possible: -allergic reactions like skin rash, itching or hives, swelling of the face, lips, or tongue -burning, itching, and redness -worsening ear pain -rash Side effects that usually do not require medical attention (report to your doctor or health care  professional if they continue or are bothersome): -abnormal sensation while putting the drops in the ear -temporary reduction in hearing (but not complete loss of hearing) This list may not describe all possible side effects. Call your doctor for medical advice about side effects. You may report side effects to FDA at 1-800-FDA-1088. Where should I keep my medicine? Keep out of the reach of children. Store at room temperature  between 15 and 30 degrees C (59 and 86 degrees F) in a tight, light-resistant container. Keep bottle away from excessive heat and direct sunlight. Throw away any unused medicine after the expiration date. NOTE: This sheet is a summary. It may not cover all possible information. If you have questions about this medicine, talk to your doctor, pharmacist, or health care provider.    2016, [employer]vier/Gold Standard. (2007-08-10 14:00:02)

## 2016-02-16 DIAGNOSIS — H6120 Impacted cerumen, unspecified ear: Secondary | ICD-10-CM | POA: Insufficient documentation

## 2016-02-16 DIAGNOSIS — R0989 Other specified symptoms and signs involving the circulatory and respiratory systems: Secondary | ICD-10-CM | POA: Insufficient documentation

## 2016-02-16 NOTE — Assessment & Plan Note (Signed)
Renal function checked on 07/2015 visit. Cr 1.31 with GFR of 63.  Renal function stable with Cr 1.2-1.3 range.   Plan Monitor renal function yearly

## 2016-02-16 NOTE — Assessment & Plan Note (Signed)
Due for screening colonoscopy. Previously seen by [facility]. Will refer back to [place] to be seen.   Received flu shot today Received Prevnar 13 today. Already received PPSV23 in 2014.

## 2016-02-16 NOTE — Assessment & Plan Note (Signed)
Says nurse visited him at home and checked his ears. Told that he had large wax buildup. Denies any hearing loss.   On exam, large cerumen impaction presnet in bilateral ears. Unable to visualize TP.   Plan Did not wish to have cerumen disimpacted in clinic today.  Provided information on over the counter remedies to remove the ear wax safety. Instructed to avoid q-tips or any other foreign object in his ear canal.

## 2016-02-16 NOTE — Assessment & Plan Note (Signed)
BP Readings from Last 3 Encounters:  02/13/16 (!) 142/58  07/18/15 128/80  01/31/15 138/63    Lab Results  Component Value Date   NA 143 07/18/2015   K 4.1 07/18/2015   CREATININE 1.31 (H) 07/18/2015    Assessment: Currently on Atenolol 25mg  daily and HCTZ 25mg  daily. Does not check his BP at home. Denies any symptoms of headaches, dizziness/lightheadedness, nausea, vision changes.   Plan: Will continue current regimen.  BMET from 07/2015 visit with stable renal function and no electrolyte abnormalities

## 2016-02-16 NOTE — Assessment & Plan Note (Signed)
Continues to smoke. Currently smoking 5 cigarettes a day. Says he is cutting down but this is an increase from what he reported in September 2016 (1 cigarette/day). No current desire for help quitting.  Encouraged cessation.

## 2016-02-16 NOTE — Assessment & Plan Note (Signed)
He says a nurse visited him at home and checked his ABIs. Brought paper with him today with the results. Notes Right ABI is 0.22 and Left is 0.29.  He denies any symptoms. Has no difficulty with ambulation or pain.  Unable to palpated distal pulses today. [name] a current everyday smoker.   Plan Very unlikely to be asymptomatic with such severe ABI. May consider formal ABIs in the future if patient develops symptoms.

## 2016-02-19 NOTE — Progress Notes (Signed)
Patient ID: Ian Hill, male   DOB: 03/20/1947, 65 y.o.   MRN: [id number]  Case discussed with Dr. Charlynn Grimes at the time of the visit. We reviewed the resident's history and exam and pertinent patient test results. I agree with the assessment, diagnosis, and plan of care documented in the resident's note.

## 2016-08-29 ENCOUNTER — Encounter: Payer: Self-pay | Admitting: Internal Medicine

## 2016-08-29 ENCOUNTER — Ambulatory Visit (INDEPENDENT_AMBULATORY_CARE_PROVIDER_SITE_OTHER): Payer: Medicare Other | Admitting: Internal Medicine

## 2016-08-29 VITALS — BP 150/74 | HR 74 | Temp 97.6°F | Ht 71.0 in | Wt 123.3 lb

## 2016-08-29 DIAGNOSIS — R3914 Feeling of incomplete bladder emptying: Secondary | ICD-10-CM | POA: Diagnosis not present

## 2016-08-29 DIAGNOSIS — R3912 Poor urinary stream: Secondary | ICD-10-CM | POA: Diagnosis not present

## 2016-08-29 DIAGNOSIS — R350 Frequency of micturition: Secondary | ICD-10-CM | POA: Diagnosis not present

## 2016-08-29 DIAGNOSIS — N401 Enlarged prostate with lower urinary tract symptoms: Secondary | ICD-10-CM | POA: Diagnosis not present

## 2016-08-29 LAB — POCT URINALYSIS DIPSTICK
Bilirubin, UA: NEGATIVE
Glucose, UA: NEGATIVE
KETONES UA: NEGATIVE
Leukocytes, UA: NEGATIVE
Nitrite, UA: NEGATIVE
Protein, UA: NEGATIVE
SPEC GRAV UA: 1.025 (ref 1.010–1.025)
UROBILINOGEN UA: 0.2 U/dL
pH, UA: 5.5 (ref 5.0–8.0)

## 2016-08-29 MED ORDER — FINASTERIDE 5 MG PO TABS: 5.0000 mg | ORAL_TABLET | Freq: Every day | ORAL | 1 refills | Status: DC

## 2016-08-29 NOTE — Progress Notes (Signed)
   CC: Urinary problems  HPI:  Ian Hill is a 66 y.o. male with a past medical history listed below here today with complaints of urinary problems.  For details of today's visit and the status of his chronic medical issues please refer to the assessment and plan.   Past Medical History:  Diagnosis Date  . 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 Diastolic dysfunction   . Diverticulosis   . Dyslipidemia   . Elevated PSA   . Hypertension   . Hypertr obst cardiomyop   . Mitral regurgitation     Review of Systems:   See HPI  Physical Exam:  Vitals:   08/29/16 1544  BP: (!) 150/74  Pulse: 74  Temp: 97.6 F (36.4 C)  TempSrc: Oral  SpO2: 100%  Weight: 123 lb 4.8 oz (55.9 kg)  Height: 5\' 11"  (1.803 m)   Physical Exam  Constitutional: [name] well-developed, well-nourished, and in no distress.  Cardiovascular: Normal rate and regular rhythm.   Pulmonary/Chest: Effort normal and breath sounds normal.  Abdominal: Soft. Bowel sounds are normal.  Vitals reviewed.    Assessment & Plan:   See Encounters Tab for problem based charting.  Patient discussed with Dr. Beryle Beams

## 2016-08-29 NOTE — Patient Instructions (Signed)
Mr. Ian Hill,  I am sending in a medication for you today called Finasteride to work on shrinking your prostate and improve your symptoms.  Please schedule follow up with me in about 2 months.   Finasteride (Propecia) tablets What is this medicine? FINASTERIDE (fi NAS teer ide) is used to treat male pattern baldness in men only. This medicine is not for use in women. This medicine may be used for other purposes; ask your health care provider or pharmacist if you have questions. COMMON BRAND NAME(S): Propecia What should I tell my health care provider before I take this medicine? They need to know if you have any of these conditions: -liver disease -an unusual or allergic reaction to finasteride, other medicines, foods, dyes, or preservatives -pregnant or trying to get pregnant -breast-feeding How should I use this medicine? Take this medicine by mouth with a glass of water. Follow the directions on the prescription label. You can take this medicine with or without food. Take your doses at regular intervals. Do not take your medicine more often than directed. Talk to your pediatrician regarding the use of this medicine in children. Special care may be needed. Overdosage: If you think you have taken too much of this medicine contact a poison control center or emergency room at once. NOTE: This medicine is only for you. Do not share this medicine with others. What if I miss a dose? If you miss a dose, take it as soon as you can. If it is almost time for your next dose, take only that dose. Do not take double or extra doses. What may interact with this medicine? -saw palmetto or other dietary supplements This list may not describe all possible interactions. Give your health care provider a list of all the medicines, herbs, non-prescription drugs, or dietary supplements you use. Also tell them if you smoke, drink alcohol, or use illegal drugs. Some items may interact with your medicine. What  should I watch for while using this medicine? It may take at least three months of daily use of this medicine before you notice an improvement in you hair loss. You must continue to take this medicine to maintain the results. If you stop taking this medicine, the effect will be reversed within 12 months. Do not donate blood while you are taking this medicine. This will prevent giving this medicine to a pregnant male through a blood transfusion. Ask your doctor or health care professional when it is safe to donate blood after you stop taking this medicine. Women who are pregnant or may get pregnant must not handle broken or crushed finasteride tablets. The active ingredient could harm the unborn baby. If a pregnant woman comes into contact with broken or crushed tablets she should check with her doctor or health care professional. Exposure to whole tablets is not expected to cause harm as long as they are not swallowed. This medicine can interfere with PSA laboratory tests for prostate cancer. If you are scheduled to have a lab test for prostate cancer, tell your doctor or health care professional that you are taking this medicine. What side effects may I notice from receiving this medicine? Side effects that you should report to your doctor or health care professional as soon as possible: -allergic reactions like skin rash, itching or hives, swelling of the face, lips, or tongue -changes in breast like lumps, pain or fluids leaking from the nipple -pain in the testicles Side effects that usually do not require medical attention (report  to your doctor or health care professional if they continue or are bothersome): -sexual difficulties like decreased sexual desire or ability to get an erection -small amount of semen released during sex This list may not describe all possible side effects. Call your doctor for medical advice about side effects. You may report side effects to FDA at  1-800-FDA-1088. Where should I keep my medicine? Keep out of the reach of children. Store at room temperature between 15 and 30 degrees C (59 and 86 degrees F). Protect from moisture. Keep container tightly closed. Throw away any unused medicine after the expiration date. NOTE: This sheet is a summary. It may not cover all possible information. If you have questions about this medicine, talk to your doctor, pharmacist, or health care provider.  2018 Elsevier/Gold Standard (2014-12-14 17:13:51)  Benign Prostatic Hyperplasia Benign prostatic hyperplasia is when the prostate gland is bigger than normal (enlarged). The prostate is a gland that produces the fluid that goes into semen. It is near the opening to the bladder and it surrounds the tube that drains urine out of the body (urethra). Benign prostatic hyperplasia is common among older men and it typically causes problems with urinating. The prostate grows slowly as you age. As the prostate grows, it can pinch the urethra. This causes the bladder to work too hard to pass urine, which leads to a thickened bladder wall. The bladder may eventually become weak and unable to empty completely. What are the causes? The exact cause of this condition is not known. It may be related to changes in hormones as the body ages. What increases the risk? You are more likely to develop this condition if:  You have a family history of the condition.  You are age 19 or older.  You have a history of erectile dysfunction.  You do not exercise.  You have certain medical conditions, including:  Type 2 diabetes.  Obesity.  Heart and circulatory disease. What are the signs or symptoms? Symptoms of this condition include:  Weak or interrupted urine stream.  Dribbling or leaking urine.  Feeling like the bladder has not emptied completely.  Difficulty starting urination.  Getting up frequently at night to urinate.  Urinating more often (8 or more  times a day).  Accidental loss of urine (urinary incontinence).  Pain during urination or ejaculation.  Urine with an unusual smell or color. The size of the prostate does not always determine the severity of the symptoms. For example, a man with a large prostate may experience minor symptoms, or a man with a smaller prostate may experience a severe blockage. How is this diagnosed? This condition may be diagnosed based on:  Your medical history and symptoms.  A physical exam. This usually includes a digital rectal exam. During this exam, your health care provider places a gloved, lubricated finger into the rectum to feel the size of the prostate.  A blood test. This test checks for high levels of a protein that is produced by the prostate (prostate specific antigen, PSA).  Tests to examine how well the urethra and bladder are functioning (urodynamic tests).  Cystoscopy. For this test, a small, tube-shaped instrument (cystoscope) is used to look inside the urethra and bladder. The cystoscope is placed into the urinary tract through the opening at the tip of the penis.  Urine tests.  Ultrasound. How is this treated? Treatment for this condition depends on how severe your symptoms are. Treatment may include:  Active surveillance or "watchful waiting."  If your symptoms are mild, your health care provider may delay treatment and ask you to keep track of your symptoms. You will have regular checkups to examine the size of your prostate, discuss symptoms, and determine whether treatment is needed.  Medicines. These may be used to:  Stop prostate growth.  Shrink the prostate.  Relieve symptoms.  Lifestyle changes, including:  Pelvic floor muscle exercises. The pelvic floor muscles are a group of muscles that relax when you urinate.  Bladder training. This involves exercises that train the bladder to hold more urine for longer periods.  Reducing the amount of liquid that you drink.  This is especially important before sleeping and before long periods of time spent in public.  Reducing the amount of caffeine and alcohol that you drink.  Treating or preventing constipation.  Surgery to reduce the size of the prostate or widen the urethra. This is typically done if your symptoms are severe or there are serious complications from the enlarged prostate. Follow these instructions at home: Medicines   Take over-the-counter and prescription medicines as told by your health care provider.  Avoid certain medicines, such as decongestants, antihistamines, and some prescription medicines as told by your health care provider. Ask your health care provider which medicines you should avoid. General instructions   Monitor your symptoms for any changes. Tell your health care provider about any changes.  Give yourself time when you urinate.  Avoid certain beverages that can irritate the bladder, such as:  Alcohol.  Caffeinated drinks like coffee, tea, and cola.  Avoid drinking large amounts of liquid before bed or before going out in public.  Do pelvic floor muscle or bladder training exercises as told by your health care provider.  Keep all follow-up visits as told by your health care provider. This is important. Contact a health care provider if:  Your develop new or worse symptoms.  You have trouble getting or maintaining an erection.  You have a fever.  You have pain or burning during urination.  You have blood in your urine. Get help right away if:  You have severe pain when urinating.  You cannot urinate.  You have severe pain in your abdomen.  You are dizzy.  You faint.  You have severe back pain.  Your urine is dark red and difficult to see through.  You have large blood clots in your urine.  You have severe pain after an erection.  You have chest pain, dizziness, or nausea during sexual activity. Summary  The prostate is a gland that  produces the fluid that goes into semen. It is near the opening to the bladder and it surrounds the tube that drains urine out of the body (urethra).  Benign prostatic hyperplasia is common among older men and it typically causes problems with urinating.  If your symptoms are mild, your health care provider may delay treatment and ask you to keep track of your symptoms. You will have regular checkups to examine the size of your prostate, discuss symptoms, and determine whether treatment is needed.  If directed, you may need to avoid certain medicines, such as decongestants, antihistamines, and some prescription medicines.  Contact your health care provider if you develop new or worse symptoms. This information is not intended to replace advice given to you by your health care provider. Make sure you discuss any questions you have with your health care provider. Document Released: 04/28/2005 Document Revised: 03/17/2016 Document Reviewed: 03/17/2016 Elsevier Interactive Patient Education  2017  [employer].

## 2016-09-01 ENCOUNTER — Encounter: Payer: Self-pay | Admitting: Internal Medicine

## 2016-09-01 ENCOUNTER — Ambulatory Visit (HOSPITAL_COMMUNITY)
Admission: RE | Admit: 2016-09-01 | Discharge: 2016-09-01 | Disposition: A | Discharge status: Home / Self Care | Payer: Medicare Other | Source: Ambulatory Visit | Attending: Internal Medicine | Admitting: Internal Medicine

## 2016-09-01 ENCOUNTER — Ambulatory Visit (INDEPENDENT_AMBULATORY_CARE_PROVIDER_SITE_OTHER): Payer: Medicare Other | Admitting: Internal Medicine

## 2016-09-01 ENCOUNTER — Telehealth: Payer: Self-pay | Admitting: *Deleted

## 2016-09-01 VITALS — BP 135/72 | HR 98 | Temp 97.9°F | Wt 126.7 lb

## 2016-09-01 DIAGNOSIS — R1033 Periumbilical pain: Secondary | ICD-10-CM | POA: Insufficient documentation

## 2016-09-01 DIAGNOSIS — K5900 Constipation, unspecified: Secondary | ICD-10-CM | POA: Diagnosis not present

## 2016-09-01 DIAGNOSIS — N400 Enlarged prostate without lower urinary tract symptoms: Secondary | ICD-10-CM | POA: Insufficient documentation

## 2016-09-01 MED ORDER — SENNOSIDES-DOCUSATE SODIUM 8.6-50 MG PO TABS: 2.0000 | ORAL_TABLET | Freq: Every day | ORAL | 0 refills | Status: DC

## 2016-09-01 MED ORDER — PSYLLIUM 28 % PO PACK: 1.0000 | PACK | Freq: Two times a day (BID) | ORAL | 0 refills | Status: DC

## 2016-09-01 NOTE — Assessment & Plan Note (Signed)
Says that he has been having frequent urination for the past 4-5 days. Says that when he goes to use the bathroom and only has a dribble. Reports a three day history of feeling like he has been having incomplete emptying of his bladder. Going to the bathroom about every hour. Getting up 2 times a night to use the bathroom. No pain with urination. No hematuria. PSA when checked 2/12 was 9.7. Appears to have had prostate biopsy done 11/2009 which were benign. Denies having any urinary symptoms in the past.   Plan: POCT today unremarkable Will start Finasteride 5 mg daily today

## 2016-09-01 NOTE — Patient Instructions (Addendum)
Ian Hill,  The belly pain and bloating you have been having is from constipation. I am going to start you on a bowel regimen to try and get things moving again. I am sending in a prescription for you. Try and drink plenty of water.   Constipation, Adult Constipation is when a person:  Poops (has a bowel movement) fewer times in a week than normal.  Has a hard time pooping.  Has poop that is dry, hard, or bigger than normal. Follow these instructions at home: Eating and drinking    Eat foods that have a lot of fiber, such as:  Fresh fruits and vegetables.  Whole grains.  Beans.  Eat less of foods that are high in fat, low in fiber, or overly processed, such as:  [place] fries.  Hamburgers.  Cookies.  Candy.  Soda.  Drink enough fluid to keep your pee (urine) clear or pale yellow. General instructions   Exercise regularly or as told by your doctor.  Go to the restroom when you feel like you need to poop. Do not hold it in.  Take over-the-counter and prescription medicines only as told by your doctor. These include any fiber supplements.  Do pelvic floor retraining exercises, such as:  Doing deep breathing while relaxing your lower belly (abdomen).  Relaxing your pelvic floor while pooping.  Watch your condition for any changes.  Keep all follow-up visits as told by your doctor. This is important. Contact a doctor if:  You have pain that gets worse.  You have a fever.  You have not pooped for 4 days.  You throw up (vomit).  You are not hungry.  You lose weight.  You are bleeding from the anus.  You have thin, pencil-like poop (stool). Get help right away if:  You have a fever, and your symptoms suddenly get worse.  You leak poop or have blood in your poop.  Your belly feels hard or bigger than normal (is bloated).  You have very bad belly pain.  You feel dizzy or you faint. This information is not intended to replace advice given to  you by your health care provider. Make sure you discuss any questions you have with your health care provider. Document Released: 10/15/2007 Document Revised: 11/16/2015 Document Reviewed: 10/17/2015 Elsevier Interactive Patient Education  2017 [employer].

## 2016-09-01 NOTE — Progress Notes (Signed)
Medicine attending: Medical history, presenting problems, physical findings, and medications, reviewed with resident physician Dr Nathan Boswell on the day of the patient visit and I concur with his evaluation and management plan. 

## 2016-09-01 NOTE — Progress Notes (Signed)
Internal Medicine Clinic Attending  Case discussed with Dr. Charlynn Grimes at the time of the visit.  We reviewed the resident's history and exam and pertinent patient test results.  I agree with the assessment, diagnosis, and plan of care documented in the resident's note. Dr Charlynn Grimes and I viewed the ABD film and indep read the film as inc stool burden and no free air.

## 2016-09-01 NOTE — Progress Notes (Signed)
   CC: Stomach swelling  HPI:  Mr.Roark L Grundman is a 66 y.o. male with a past medical history listed below here today with complaints of abdominal swelling.   For details of today's visit and the status of his chronic medical issues please refer to the assessment and plan.    Past Medical History:  Diagnosis Date  . 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 Diastolic dysfunction   . Diverticulosis   . Dyslipidemia   . Elevated PSA   . Hypertension   . Hypertr obst cardiomyop   . Mitral regurgitation     Review of Systems:   See HPI  Physical Exam:  Vitals:   09/01/16 0923  BP: 135/72  Pulse: 98  Temp: 97.9 F (36.6 C)  TempSrc: Oral  SpO2: 98%  Weight: 126 lb 11.5 oz (57.5 kg)   Physical Exam  Constitutional: [name] well-developed, well-nourished, and in no distress.  Cardiovascular: Normal rate and regular rhythm.   Pulmonary/Chest: Effort normal and breath sounds normal.  Abdominal: Normal appearance and bowel sounds are normal. He exhibits no ascites and no pulsatile midline mass. There is no hepatosplenomegaly. There is tenderness in the periumbilical area. There is no rigidity, no guarding, no tenderness at McBurney's point and negative Murphy's sign.    Vitals reviewed.    Assessment & Plan:   See Encounters Tab for problem based charting.  Patient discussed with Dr. Lynnae January

## 2016-09-01 NOTE — Telephone Encounter (Signed)
Pt that was seen in [facility] on 4/20, now calling stating his abdomen is hard and swollen x 3 days.  Denies any shortness of breath, pt given appt for today @ 9:45 in [facility].Regenia Skeeter, Audreanna Torrisi Cassady4/23/20189:14 AM

## 2016-09-01 NOTE — Assessment & Plan Note (Signed)
Reports that Friday after starting finasteride he was having abdominal bloating. Felt like his abdomen was hard. Having diarrhea. Reports 3 bowel movements a day. Reports they are small and hard. No hematochezia or melena. No fevers, chills, nausea, vomiting. Reports feeling full after eating small amounts. Not drinking much water since he started having the urination problems. No weight loss.  KUB done today with no acute abnormalities but does appear to have significant stool in the bowels.   Plan: Abdominal pain 2/2 constipation. Will check LFTs to rule out any hepatic source and CBC to ensure no anemia. Given Rix for Senokot-S. Encouraged increased fiber and water intake.

## 2016-09-02 LAB — CBC
HEMOGLOBIN: 13.6 g/dL (ref 13.0–17.7)
Hematocrit: 42.5 % (ref 37.5–51.0)
MCH: 28.8 pg (ref 26.6–33.0)
MCHC: 32 g/dL (ref 31.5–35.7)
MCV: 90 fL (ref 79–97)
Platelets: 159 10*3/uL (ref 150–379)
RBC: 4.73 x10E6/uL (ref 4.14–5.80)
RDW: 14.7 % (ref 12.3–15.4)
WBC: 7.1 10*3/uL (ref 3.4–10.8)

## 2016-09-02 LAB — COMPREHENSIVE METABOLIC PANEL
ALT: 14 IU/L (ref 0–44)
AST: 32 IU/L (ref 0–40)
Albumin/Globulin Ratio: 1.7 (ref 1.2–2.2)
Albumin: 4.3 g/dL (ref 3.5–4.8)
Alkaline Phosphatase: 98 IU/L (ref 39–117)
BILIRUBIN TOTAL: 0.9 mg/dL (ref 0.0–1.2)
BUN / CREAT RATIO: 18 (ref 10–24)
BUN: 44 mg/dL — AB (ref 8–27)
CO2: 24 mmol/L (ref 18–29)
Calcium: 9.4 mg/dL (ref 8.6–10.2)
Chloride: 94 mmol/L — ABNORMAL LOW (ref 96–106)
Creatinine, Ser: 2.44 mg/dL — ABNORMAL HIGH (ref 0.76–1.27)
GFR calc Af Amer: 30 mL/min/{1.73_m2} — ABNORMAL LOW (ref >59)
GFR, EST NON AFRICAN AMERICAN: 26 mL/min/{1.73_m2} — AB (ref >59)
Globulin, Total: 2.6 g/dL (ref 1.5–4.5)
Glucose: 175 mg/dL — ABNORMAL HIGH (ref 65–99)
Potassium: 4.3 mmol/L (ref 3.5–5.2)
Sodium: 138 mmol/L (ref 134–144)
TOTAL PROTEIN: 6.9 g/dL (ref 6.0–8.5)

## 2016-09-10 ENCOUNTER — Other Ambulatory Visit: Payer: Self-pay

## 2016-09-10 ENCOUNTER — Inpatient Hospital Stay (HOSPITAL_COMMUNITY)
Admission: EM | Admit: 2016-09-10 | Discharge: 2016-09-11 | DRG: 726 | Disposition: A | Discharge status: Home / Self Care | Payer: Medicare Other | Attending: Internal Medicine | Admitting: Internal Medicine

## 2016-09-10 ENCOUNTER — Emergency Department (HOSPITAL_COMMUNITY): Payer: Medicare Other

## 2016-09-10 ENCOUNTER — Encounter (HOSPITAL_COMMUNITY): Payer: Self-pay | Admitting: *Deleted

## 2016-09-10 ENCOUNTER — Emergency Department (HOSPITAL_BASED_OUTPATIENT_CLINIC_OR_DEPARTMENT_OTHER)
Admit: 2016-09-10 | Discharge: 2016-09-10 | Disposition: A | Discharge status: Home / Self Care | Payer: Medicare Other | Attending: Emergency Medicine | Admitting: Emergency Medicine

## 2016-09-10 DIAGNOSIS — N134 Hydroureter: Secondary | ICD-10-CM | POA: Diagnosis present

## 2016-09-10 DIAGNOSIS — I340 Nonrheumatic mitral (valve) insufficiency: Secondary | ICD-10-CM | POA: Diagnosis present

## 2016-09-10 DIAGNOSIS — R0600 Dyspnea, unspecified: Secondary | ICD-10-CM | POA: Diagnosis not present

## 2016-09-10 DIAGNOSIS — R339 Retention of urine, unspecified: Secondary | ICD-10-CM | POA: Diagnosis present

## 2016-09-10 DIAGNOSIS — N179 Acute kidney failure, unspecified: Secondary | ICD-10-CM | POA: Diagnosis not present

## 2016-09-10 DIAGNOSIS — R140 Abdominal distension (gaseous): Secondary | ICD-10-CM

## 2016-09-10 DIAGNOSIS — I110 Hypertensive heart disease with heart failure: Secondary | ICD-10-CM | POA: Diagnosis not present

## 2016-09-10 DIAGNOSIS — I421 Obstructive hypertrophic cardiomyopathy: Secondary | ICD-10-CM | POA: Diagnosis not present

## 2016-09-10 DIAGNOSIS — M7989 Other specified soft tissue disorders: Secondary | ICD-10-CM | POA: Diagnosis not present

## 2016-09-10 DIAGNOSIS — R338 Other retention of urine: Secondary | ICD-10-CM | POA: Diagnosis present

## 2016-09-10 DIAGNOSIS — Z79899 Other long term (current) drug therapy: Secondary | ICD-10-CM | POA: Diagnosis not present

## 2016-09-10 DIAGNOSIS — N401 Enlarged prostate with lower urinary tract symptoms: Principal | ICD-10-CM | POA: Diagnosis present

## 2016-09-10 DIAGNOSIS — K5900 Constipation, unspecified: Secondary | ICD-10-CM | POA: Diagnosis not present

## 2016-09-10 DIAGNOSIS — N1339 Other hydronephrosis: Secondary | ICD-10-CM | POA: Diagnosis present

## 2016-09-10 DIAGNOSIS — F17210 Nicotine dependence, cigarettes, uncomplicated: Secondary | ICD-10-CM | POA: Diagnosis present

## 2016-09-10 DIAGNOSIS — E785 Hyperlipidemia, unspecified: Secondary | ICD-10-CM | POA: Diagnosis not present

## 2016-09-10 DIAGNOSIS — E8770 Fluid overload, unspecified: Secondary | ICD-10-CM | POA: Diagnosis present

## 2016-09-10 DIAGNOSIS — N130 Hydronephrosis with ureteropelvic junction obstruction: Secondary | ICD-10-CM | POA: Diagnosis not present

## 2016-09-10 DIAGNOSIS — I5032 Chronic diastolic (congestive) heart failure: Secondary | ICD-10-CM | POA: Diagnosis present

## 2016-09-10 DIAGNOSIS — R600 Localized edema: Secondary | ICD-10-CM | POA: Diagnosis not present

## 2016-09-10 DIAGNOSIS — I10 Essential (primary) hypertension: Secondary | ICD-10-CM | POA: Diagnosis not present

## 2016-09-10 HISTORY — DX: Chronic kidney disease, stage 2 (mild): N18.2

## 2016-09-10 HISTORY — DX: Benign prostatic hyperplasia without lower urinary tract symptoms: N40.0

## 2016-09-10 HISTORY — DX: Retention of urine, unspecified: R33.9

## 2016-09-10 LAB — URINALYSIS, ROUTINE W REFLEX MICROSCOPIC
BILIRUBIN URINE: NEGATIVE
Bilirubin Urine: NEGATIVE
GLUCOSE, UA: NEGATIVE mg/dL
Glucose, UA: NEGATIVE mg/dL
KETONES UR: NEGATIVE mg/dL
Ketones, ur: NEGATIVE mg/dL
Leukocytes, UA: NEGATIVE
Leukocytes, UA: NEGATIVE
Nitrite: NEGATIVE
Nitrite: NEGATIVE
PH: 5 (ref 5.0–8.0)
Protein, ur: NEGATIVE mg/dL
Protein, ur: NEGATIVE mg/dL
SPECIFIC GRAVITY, URINE: 1.014 (ref 1.005–1.030)
SQUAMOUS EPITHELIAL / LPF: NONE SEEN
Specific Gravity, Urine: 1.015 (ref 1.005–1.030)
Squamous Epithelial / LPF: NONE SEEN
pH: 5 (ref 5.0–8.0)

## 2016-09-10 LAB — BASIC METABOLIC PANEL
Anion gap: 12 (ref 5–15)
BUN: 31 mg/dL — AB (ref 6–20)
CO2: 23 mmol/L (ref 22–32)
Calcium: 8.8 mg/dL — ABNORMAL LOW (ref 8.9–10.3)
Chloride: 102 mmol/L (ref 101–111)
Creatinine, Ser: 2.07 mg/dL — ABNORMAL HIGH (ref 0.61–1.24)
GFR calc Af Amer: 35 mL/min — ABNORMAL LOW (ref >60)
GFR, EST NON AFRICAN AMERICAN: 31 mL/min — AB (ref >60)
Glucose, Bld: 71 mg/dL (ref 65–99)
Potassium: 4.7 mmol/L (ref 3.5–5.1)
Sodium: 137 mmol/L (ref 135–145)

## 2016-09-10 LAB — CBC WITH DIFFERENTIAL/PLATELET
BASOS ABS: 0 10*3/uL (ref 0.0–0.1)
Basophils Relative: 0 %
EOS ABS: 0.1 10*3/uL (ref 0.0–0.7)
EOS PCT: 1 %
HCT: 38 % — ABNORMAL LOW (ref 39.0–52.0)
HEMOGLOBIN: 12.7 g/dL — AB (ref 13.0–17.0)
Lymphocytes Relative: 27 %
Lymphs Abs: 1.7 10*3/uL (ref 0.7–4.0)
MCH: 29.4 pg (ref 26.0–34.0)
MCHC: 33.4 g/dL (ref 30.0–36.0)
MCV: 88 fL (ref 78.0–100.0)
Monocytes Absolute: 0.5 10*3/uL (ref 0.1–1.0)
Monocytes Relative: 8 %
NEUTROS PCT: 64 %
Neutro Abs: 4 10*3/uL (ref 1.7–7.7)
PLATELETS: 167 10*3/uL (ref 150–400)
RBC: 4.32 MIL/uL (ref 4.22–5.81)
RDW: 14.2 % (ref 11.5–15.5)
WBC: 6.3 10*3/uL (ref 4.0–10.5)

## 2016-09-10 LAB — COMPREHENSIVE METABOLIC PANEL
ALT: 23 U/L (ref 17–63)
ANION GAP: 13 (ref 5–15)
AST: 33 U/L (ref 15–41)
Albumin: 3.6 g/dL (ref 3.5–5.0)
Alkaline Phosphatase: 81 U/L (ref 38–126)
BUN: 32 mg/dL — ABNORMAL HIGH (ref 6–20)
CALCIUM: 9.3 mg/dL (ref 8.9–10.3)
CHLORIDE: 102 mmol/L (ref 101–111)
CO2: 23 mmol/L (ref 22–32)
CREATININE: 2.32 mg/dL — AB (ref 0.61–1.24)
GFR, EST AFRICAN AMERICAN: 31 mL/min — AB (ref >60)
GFR, EST NON AFRICAN AMERICAN: 27 mL/min — AB (ref >60)
Glucose, Bld: 104 mg/dL — ABNORMAL HIGH (ref 65–99)
Potassium: 4.4 mmol/L (ref 3.5–5.1)
SODIUM: 138 mmol/L (ref 135–145)
Total Bilirubin: 1.1 mg/dL (ref 0.3–1.2)
Total Protein: 7.1 g/dL (ref 6.5–8.1)

## 2016-09-10 LAB — MAGNESIUM: Magnesium: 2.4 mg/dL (ref 1.7–2.4)

## 2016-09-10 LAB — I-STAT TROPONIN, ED: TROPONIN I, POC: 0.06 ng/mL (ref 0.00–0.08)

## 2016-09-10 LAB — TROPONIN I: TROPONIN I: 0.05 ng/mL — AB (ref <0.03)

## 2016-09-10 LAB — BRAIN NATRIURETIC PEPTIDE: B NATRIURETIC PEPTIDE 5: 1903.9 pg/mL — AB (ref 0.0–100.0)

## 2016-09-10 MED ORDER — SENNOSIDES-DOCUSATE SODIUM 8.6-50 MG PO TABS
2.0000 | ORAL_TABLET | Freq: Every day | ORAL | Status: DC
  Administered 2016-09-10 – 2016-09-11 (×2): 2 via ORAL
  Filled 2016-09-10 (×2): qty 2

## 2016-09-10 MED ORDER — SIMVASTATIN 40 MG PO TABS
40.0000 mg | ORAL_TABLET | Freq: Every day | ORAL | Status: DC
  Administered 2016-09-11: 40 mg via ORAL
  Filled 2016-09-10: qty 1

## 2016-09-10 MED ORDER — IOPAMIDOL (ISOVUE-300) INJECTION 61%: 15.0000 mL | INTRAVENOUS | Status: AC

## 2016-09-10 MED ORDER — TAMSULOSIN HCL 0.4 MG PO CAPS
0.4000 mg | ORAL_CAPSULE | Freq: Every day | ORAL | Status: DC
Start: 2016-09-10 — End: 2016-09-11
  Administered 2016-09-10 – 2016-09-11 (×2): 0.4 mg via ORAL
  Filled 2016-09-10 (×2): qty 1

## 2016-09-10 MED ORDER — ENOXAPARIN SODIUM 30 MG/0.3ML ~~LOC~~ SOLN
30.0000 mg | SUBCUTANEOUS | Status: DC
  Administered 2016-09-11: 30 mg via SUBCUTANEOUS
  Filled 2016-09-10: qty 0.3

## 2016-09-10 MED ORDER — LIDOCAINE HCL 2 % EX GEL
1.0000 "application " | Freq: Once | CUTANEOUS | Status: AC
  Administered 2016-09-10: 1 via URETHRAL
  Filled 2016-09-10: qty 20

## 2016-09-10 MED ORDER — POLYETHYLENE GLYCOL 3350 17 G PO PACK: 17.0000 g | PACK | Freq: Every day | ORAL | Status: DC | PRN

## 2016-09-10 MED ORDER — FINASTERIDE 5 MG PO TABS
5.0000 mg | ORAL_TABLET | Freq: Every day | ORAL | Status: DC
  Administered 2016-09-10 – 2016-09-11 (×2): 5 mg via ORAL
  Filled 2016-09-10 (×2): qty 1

## 2016-09-10 MED ORDER — ATENOLOL 50 MG PO TABS
25.0000 mg | ORAL_TABLET | Freq: Every day | ORAL | Status: DC
  Administered 2016-09-10: 25 mg via ORAL
  Filled 2016-09-10 (×2): qty 1

## 2016-09-10 NOTE — ED Provider Notes (Signed)
[facility] DEPT Provider Note   CSN: [id number] Arrival date & time: 09/10/16  0804     History   Chief Complaint Chief Complaint  Patient presents with  . Abdominal Pain    HPI Ian Hill is a 66 y.o. male.  HPI  66 year old male with a history of diastolic dysfunction, hypertension, and Aspergilloma presents with abdominal distention and leg swelling.  patient has had abdominal section for 1 week or more. Went to his PCPs office for difficulty urinating on 4/20. They placed him on a prostate medicine given elevated PSA. He has been urinating better and almost too much now but since then has had abdominal distention. They placed him on a stool softener. [name] now having to looser bowel movements per day. However his abdomen is still distended. He denies any pain. He denies any shortness of breath or chest pain. However his family states that he has been having to catch his breath more. No cough. Over the last 24 hours or so they have also knows bilateral leg swelling   Past Medical History:  Diagnosis Date  . Aspergilloma ([place])   . Diastolic dysfunction   . Diverticulosis   . Dyslipidemia   . Elevated PSA   . Hypertension   . Hypertr obst cardiomyop   . Mitral regurgitation     Patient Active Problem List   Diagnosis Date Noted  . Volume overload 09/10/2016  . BPH (benign prostatic hyperplasia) 09/01/2016  . Periumbilical abdominal pain 09/01/2016  . Cerumen impaction 02/16/2016  . Decreased dorsalis pedis pulse 02/16/2016  . CKD (chronic kidney disease), stage II 02/20/2012  . Preventative health care 06/26/2010  . Other symptoms involving cardiovascular system 11/19/2009  . Hypertrophic obstructive cardiomyopathy ([place]) 10/10/2009  . Hyperlipidemia 04/30/2006  . TOBACCO ABUSE 04/30/2006  . Essential hypertension 04/30/2006  . ELEVATED PROSTATE SPECIFIC ANTIGEN 04/30/2006  . DIVERTICULOSIS, COLON 12/18/2005    Past Surgical History:  Procedure Laterality  Date  . aspergilloma resection     LUL, s/p resection Feb 1991       Home Medications    Prior to Admission medications   Medication Sig Start Date End Date Taking? Authorizing Provider  atenolol (TENORMIN) 25 MG tablet Take 1 tablet (25 mg total) by mouth daily. 02/13/16  Yes [name], MD  finasteride (PROSCAR) 5 MG tablet Take 1 tablet (5 mg total) by mouth daily. 08/29/16  Yes [name], MD  hydrochlorothiazide (HYDRODIURIL) 25 MG tablet Take 1 tablet (25 mg total) by mouth daily. 02/13/16  Yes [name], MD  senna-docusate (SENOKOT-S) 8.6-50 MG tablet Take 2 tablets by mouth daily. 09/01/16  Yes [name], MD  simvastatin (ZOCOR) 40 MG tablet Take 1 tablet (40 mg total) by mouth at bedtime. 02/13/16  Yes [name], MD  psyllium (METAMUCIL SMOOTH TEXTURE) 28 % packet Take 1 packet by mouth 2 (two) times daily. Patient not taking: Reported on 09/10/2016 09/01/16   [name], MD    Family History History reviewed. No pertinent family history.  Social History Social History  Substance Use Topics  . Smoking status: Current Some Day Smoker    Packs/day: 0.20    Types: Cigarettes  . Smokeless tobacco: Never Used     Comment: two cigs a day  . Alcohol use No     Allergies   Patient has no known allergies.   Review of Systems Review of Systems  Constitutional: Negative for fever.  Respiratory: Negative for shortness of breath.   Cardiovascular:  Positive for leg swelling. Negative for chest pain.  Gastrointestinal: Positive for abdominal distention, constipation and diarrhea. Negative for abdominal pain and vomiting.  Genitourinary: Negative for dysuria.  All other systems reviewed and are negative.    Physical Exam Updated Vital Signs BP (!) 145/77   Pulse 91   Temp 97.6 F (36.4 C) (Oral)   Resp 18   Wt 135 lb 9.6 oz (61.5 kg)   SpO2 100%   BMI 18.91 kg/m   Physical Exam  Constitutional: [name] oriented to person, place, and time. He  appears well-developed and well-nourished. No distress.  HENT:  Head: Normocephalic and atraumatic.  Right Ear: External ear normal.  Left Ear: External ear normal.  Nose: Nose normal.  Eyes: Right eye exhibits no discharge. Left eye exhibits no discharge.  Neck: Neck supple.  Cardiovascular: Normal rate, regular rhythm and normal heart sounds.   Pulmonary/Chest: Effort normal and breath sounds normal. He has no rales.  Abdominal: Soft. He exhibits distension (mostly lower) and mass (large lower-mid abdominal mass). There is tenderness (mild).  Genitourinary: Rectal exam shows external hemorrhoid (nonthrombosed). Prostate is enlarged.  Genitourinary Comments: No fecal impaction  Musculoskeletal: He exhibits edema (Pitting edema to bilateral feet/ankles).  Neurological: [name] alert and oriented to person, place, and time.  Skin: Skin is warm and dry. [name] not diaphoretic.  Nursing note and vitals reviewed.    ED Treatments / Results  Labs (all labs ordered are listed, but only abnormal results are displayed) Labs Reviewed  URINALYSIS, ROUTINE W REFLEX MICROSCOPIC - Abnormal; Notable for the following:       Result Value   Hgb urine dipstick MODERATE (*)    Bacteria, UA RARE (*)    All other components within normal limits  COMPREHENSIVE METABOLIC PANEL - Abnormal; Notable for the following:    Glucose, Bld 104 (*)    BUN 32 (*)    Creatinine, Ser 2.32 (*)    GFR calc non Af Amer 27 (*)    GFR calc Af Amer 31 (*)    All other components within normal limits  BRAIN NATRIURETIC PEPTIDE - Abnormal; Notable for the following:    B Natriuretic Peptide 1,903.9 (*)    All other components within normal limits  CBC WITH DIFFERENTIAL/PLATELET - Abnormal; Notable for the following:    Hemoglobin 12.7 (*)    HCT 38.0 (*)    All other components within normal limits  URINALYSIS, ROUTINE W REFLEX MICROSCOPIC - Abnormal; Notable for the following:    Hgb urine dipstick MODERATE (*)     Bacteria, UA RARE (*)    All other components within normal limits  I-STAT TROPOININ, ED    EKG  EKG Interpretation  Date/Time:  Wednesday Sep 10 2016 08:27:02 EDT Ventricular Rate:  63 PR Interval:    QRS Duration: 108 QT Interval:  436 QTC Calculation: 447 R Axis:   73 Text Interpretation:  Sinus rhythm Multiple premature complexes, vent & supraven Borderline short PR interval Probable left atrial enlargement RSR' in V1 or V2, probably normal variant LVH with secondary repolarization abnormality ST depression, consider ischemia, diffuse lds Anterior ST elevation, probably due to LVH no significant change since Mar 2017 Confirmed by Regenia Skeeter MD, Meggie Laseter [phone]) on 09/10/2016 9:02:29 AM       Radiology Ct Abdomen Pelvis Wo Contrast  Result Date: 09/10/2016 CLINICAL DATA:  Constipation. Abdominal distention and difficulty urinating. Current abdominal radiographs suggest a mid to lower abdominal mass displacing bowel. EXAM:  CT ABDOMEN AND PELVIS WITHOUT CONTRAST TECHNIQUE: Multidetector CT imaging of the abdomen and pelvis was performed following the standard protocol without IV contrast. COMPARISON:  Current abdominal and pelvic radiographs. FINDINGS: Lower chest: 12 x 8 mm, average 10 mm, nodule contiguous with the inferior right oblique fissure common the right lower lobe. There is associated linear opacity and fissure retraction consistent with scarring. Linear opacities noted at the left lung base which is likely combination of scarring and subsegmental atelectasis. Minimal right pleural effusion. Heart is mildly enlarged. Small hiatal hernia. Hepatobiliary: No focal liver abnormality is seen. No gallstones, gallbladder wall thickening, or biliary dilatation. Pancreas: Unremarkable. No pancreatic ductal dilatation or surrounding inflammatory changes. Spleen: Normal in size without focal abnormality. Adrenals/Urinary Tract: No adrenal masses. There is mild bilateral hydronephrosis and  hydroureter, with no evidence of ureteral or intrarenal stone. There are no renal masses. The bladder is markedly distended extending into the mid to upper abdomen, accounting for the mass noted on the current radiographs. The prostate gland is enlarged and appears to involve the bladder base extend to the region of both of the ureterovesicular junctions. No other bladder abnormality. No bladder stone. Stomach/Bowel: The bowel is displaced by the enlarged bladder. No bowel obstruction or bowel wall thickening or inflammation. Stomach is unremarkable other than the hiatal hernia. Normal appendix is visualized. Vascular/Lymphatic: There is atherosclerotic calcification extends along the normal caliber abdominal aorta into the iliac and femoral vessels. Mild prominent shotty lymph nodes are noted in the inguinal regions, largest on the right measuring 12 mm in short axis. No other adenopathy. Reproductive: Enlarged prostate that appears to invade the bladder base as described above. Prostate measures 5.9 x 5.8 x 6.0 cm. Other: Trace ascites in the posterior pelvic recess.  No hernia. There is subcutaneous edema along the right lower extremity. This could be due to extrinsic compression of the right iliac vein. Venous thrombosis should be considered if there are consistent clinical findings. Musculoskeletal: No fracture or acute finding. No osteoblastic or osteolytic lesions. IMPRESSION: 1. No mass noted radiographically is a markedly enlarged bladder. The cause of the bladder distention is from an enlarged prostate, which appears to invade the posterior inferior bladder base extending to the region of both ureteral trigones. This leads to mild bilateral hydronephrosis and hydroureter. 2. Mildly enlarged shotty inguinal lymph nodes. No other adenopathy. There are no osteoblastic bone lesions or other evidence suggesting metastatic prostate carcinoma. 3. Subcutaneous right lower extremity edema. Consider possibility of  right lower extremity deep venous thrombosis. 4. 10 mm mean diameter right lower lobe pulmonary nodule. This is associated with apparent scarring some supporting a benign etiology. Neoplastic disease is possible. Consider one of the following in 3 months for both low-risk and high-risk individuals: (a) repeat chest CT, (b) follow-up PET-CT, or (c) tissue sampling. This recommendation follows the consensus statement: Guidelines for Management of Incidental Pulmonary Nodules Detected on CT Images: From the Fleischner Society 2017; Radiology 2017; 284:228-243. 5. Aortic atherosclerosis. These results were discussed by telephone at the time of interpretation on 09/10/2016 at 1:14 pm with Dr. Sherwood Gambler . Electronically Signed   By: Lajean Manes M.D.   On: 09/10/2016 13:15   Dg Chest 2 View  Result Date: 09/10/2016 CLINICAL DATA:  Abdominal distension, dyspnea, history of mitral regurgitation, current smoker. EXAM: CHEST  2 VIEW COMPARISON:  Chest x-ray of October 27, 2014 FINDINGS: The lungs are well-expanded. The interstitial markings are coarse though stable. There is stable minimal blunting of  the right lateral costophrenic angle. The cardiac silhouette is mildly enlarged but stable. The pulmonary vascularity is not engorged. There is calcification in the wall of the aortic arch. The bony thorax exhibits no acute abnormality. IMPRESSION: Chronic bronchitic -smoking related changes. Stable mild cardiomegaly. No pulmonary vascular congestion or pulmonary edema. Electronically Signed   By: David  Martinique M.D.   On: 09/10/2016 09:26   Dg Abd 2 Views  Result Date: 09/10/2016 CLINICAL DATA:  Recent episode of constipation with subsequent improvement after treatment but now with recurrent abdominal distension. EXAM: ABDOMEN - 2 VIEW COMPARISON:  KUB of September 01, 2016 FINDINGS: The colonic stool burden is moderate. There is no small or large bowel obstructive pattern. No free extraluminal gas collections are observed.  No gas is demonstrated in the pelvis. The bowel appears draped over a rounded mid to lower abdominal mass-like density. The bony structures are unremarkable. IMPRESSION: Moderately increased colonic stool burden may reflect mild constipation. There is no evidence of bowel obstruction. Given the abdominal distension and the draping of colon over a curvilinear soft tissue density in the mid and lower abdomen, abdominal and pelvic CT scanning is recommended to exclude an abdominal mass. Electronically Signed   By: David  Martinique M.D.   On: 09/10/2016 09:29    Procedures Procedures (including critical care time)  Medications Ordered in ED Medications  iopamidol (ISOVUE-300) 61 % injection 15 mL (not administered)  lidocaine (XYLOCAINE) 2 % jelly 1 application (1 application Urethral Given 09/10/16 1602)     Initial Impression / Assessment and Plan / ED Course  I have reviewed the triage vital signs and the nursing notes.  Pertinent labs & imaging results that were available during my care of the patient were reviewed by me and considered in my medical decision making (see chart for details).     Workup shows his distention is from very large bladder with retention from prostate. Coude placed by nursing staff. Urology will consult. Given hydro, subacute renal dysfunction and apparent volume overload, will admit to internal medicine teaching service.   Final Clinical Impressions(s) / ED Diagnoses   Final diagnoses:  Abdominal distention  Urinary retention    New Prescriptions Current Discharge Medication List       Sherwood Gambler, MD 09/10/16 1659

## 2016-09-10 NOTE — Progress Notes (Signed)
CRITICAL VALUE ALERT  Critical value received:  Troponin 0.05  Date of notification:  09/10/16  Time of notification:  1930  Critical value read back:Yes.    Nurse who received alert:  Ranelle Oyster, RN  MD notified (1st page):  Dr. Gay Filler  Time of first page:  1932  MD notified (2nd page):  Time of second page:  Responding MD: Dr. Gay Filler (No new orders given)  Time MD responded:  1933

## 2016-09-10 NOTE — ED Notes (Signed)
Urology unable to place coude. Rehab RN contacted to place one.

## 2016-09-10 NOTE — ED Notes (Signed)
Post residual bladder scan read >999 ml. EDP made aware.

## 2016-09-10 NOTE — ED Notes (Signed)
Pt returned to room from xray.

## 2016-09-10 NOTE — Consult Note (Addendum)
Urology Consult  Consulting MD: Tyron Russell, MD  CC: Abdominal mass  HPI: This is a 66 year old male admitted to the medicine service at this time for significant urinary retention/a huge bladder as well as bilateral hydronephrosis and renal insufficiency.  The patient was recently noted to have significant urinary retention, and presented recently with abdominal tightness and an abdominal mass.  The patient underwent evaluation including a CT scan which revealed a huge bladder, mild to moderate bilateral hydroureteronephrosis as well as a very large prostate with intravesical protrusion.  The patient states that he has had urinary difficulty for some time.  He has a hard time categorizing this, but apparently he has had nocturia 3, straining to urinate, frequency, urgency, intermittency as well as hesitancy.  That he knows of, there is no family history of prostate problems/cancer.  Apparently, catheterization in the emergency room was difficult.  I suggested that a coud-tip catheter be placed.  I see no record of this having been done, but the patient currently has a Foley catheter in place.  Urologic consultation is requested.   PMH: Past Medical History:  Diagnosis Date  . Aspergilloma ([place])   . Diastolic dysfunction   . Diverticulosis   . Dyslipidemia   . Elevated PSA   . Hypertension   . Hypertr obst cardiomyop   . Mitral regurgitation     PSH: Past Surgical History:  Procedure Laterality Date  . aspergilloma resection     LUL, s/p resection Feb 1991    Allergies: No Known Allergies  Medications: Prescriptions Prior to Admission  Medication Sig Dispense Refill Last Dose  . atenolol (TENORMIN) 25 MG tablet Take 1 tablet (25 mg total) by mouth daily. 90 tablet 2 09/09/2016 at 0700  . finasteride (PROSCAR) 5 MG tablet Take 1 tablet (5 mg total) by mouth daily. 90 tablet 1 09/09/2016 at Unknown time  . hydrochlorothiazide (HYDRODIURIL) 25 MG tablet Take 1 tablet (25 mg total)  by mouth daily. 90 tablet 2 09/09/2016 at Unknown time  . senna-docusate (SENOKOT-S) 8.6-50 MG tablet Take 2 tablets by mouth daily. 30 tablet 0 09/09/2016 at Unknown time  . simvastatin (ZOCOR) 40 MG tablet Take 1 tablet (40 mg total) by mouth at bedtime. 90 tablet 2 09/09/2016 at Unknown time  . psyllium (METAMUCIL SMOOTH TEXTURE) 28 % packet Take 1 packet by mouth 2 (two) times daily. (Patient not taking: Reported on 09/10/2016) 60 packet 0 Not Taking at Unknown time     Social History: Social History   Social History  . Marital status: Single    Spouse name: N/A  . Number of children: N/A  . Years of education: N/A   Occupational History  . Not on file.   Social History Main Topics  . Smoking status: Current Some Day Smoker    Packs/day: 0.20    Types: Cigarettes  . Smokeless tobacco: Never Used     Comment: two cigs a day  . Alcohol use No  . Drug use: No  . Sexual activity: Not on file   Other Topics Concern  . Not on file   Social History Narrative  . No narrative on file    Family History: History reviewed. No pertinent family history.  Review of Systems: Positive: Above-noted lower urinary tract symptoms, abdominal discomfort/tightness  Negative:  A further 10 point review of systems was negative except what is listed in the HPI.  Physical Exam: @VITALS2 @ General: No acute distress.  Awake. Head:  Normocephalic.  Atraumatic. ENT:  EOMI.  Mucous membranes moist Neck:  Supple.  No lymphadenopathy. CV:  S1 present. S2 present. Regular rate. Pulmonary: Equal effort bilaterally.  Clear to auscultation bilaterally. Abdomen: Soft,  scaphoid.  Non- tender to palpation. Skin:  Normal turgor.  No visible rash. Neurologic: Alert. Appropriate mood. Rectal:            External skin tags present.  Normal anal sphincter tone.  No rectal masses.  Hard to categorize prostate due to                         patient's position, but it was symmetric, large, nonnodular.    Penis:  Circumcised.  No lesions.  Deep pigmented glans.   Urethra: Foley catheter in place.  Orthotopic meatus. Scrotum: No lesions.  No ecchymosis.  No erythema. Testicles: Descended bilaterally.  No masses bilaterally. Epididymis: Palpable bilaterally.  Non Tender to palpation.  Studies:  Recent Labs     09/10/16  0838  HGB  12.7*  WBC  6.3  PLT  167    Recent Labs     09/10/16  0838  NA  138  K  4.4  CL  102  CO2  23  BUN  32*  CREATININE  2.32*  CALCIUM  9.3  GFRNONAA  27*  GFRAA  31*    I reviewed the patient's CT scan images.  He did have a huge bladder, and there is a large prostate with intravesical protrusion.  This is not necessarily consistent with prostate cancer.  There is bilateral hydroureteronephrosis, mild to moderate.  Laboratories were reviewed  No results for input(s): INR, APTT in the last 72 hours.  Invalid input(s): PT   Invalid input(s): ABG    Assessment:  1.  BPH with significant obstruction.  This is currently managed with a Foley catheter.  Patient has drained adequately. 2.  Bilateral hydroureteronephrosis with acute renal insufficiency, secondary to retention.  This should resolve with catheter drainage.  Plan: 1.  Leave catheter in for the near future. 2.  The patient may well experience hematuria following decompression of his bladder, as well as postobstructive diuresis.  Take care not to over hydrate the patient --volume repletion with postobstructive diuresis should basically be hydration by patient oral intake.  Be sure to follow the patient's potassium adequately. 3.  Start patient on alpha blocker-I See that that has been started.  It is okay to continue finasteride, although this takes several weeks/months to show an adequate effect.. 4.  He should go home with his Foley catheter.  We will work on setting up an appointment for him to follow-up in her office to evaluate his BPH and provide voiding trial if appropriate     Pager:[phone]

## 2016-09-10 NOTE — ED Notes (Signed)
Pulse ox reads 94% room air.

## 2016-09-10 NOTE — ED Notes (Signed)
This RN attempted 2x for Iv start unsuccessfully.

## 2016-09-10 NOTE — Progress Notes (Signed)
*  Preliminary Results* Right lower extremity venous duplex completed. Right lower extremity is negative for deep vein thrombosis. There is no evidence of right Baker's cyst. Rouleaux flow was noted in the right lower extremity veins and in the left common femoral vein.  09/10/2016 3:01 PM  Maudry Mayhew, BS, RVT, RDCS, RDMS

## 2016-09-10 NOTE — ED Notes (Signed)
Pt. Transported to vascular ultrasound at this time.

## 2016-09-10 NOTE — ED Notes (Signed)
Wheeled patient to the bathroom patient did well pt back in bed family at bedside

## 2016-09-10 NOTE — ED Notes (Signed)
Patient transported to X-ray on cardiac monitor

## 2016-09-10 NOTE — ED Notes (Signed)
Patient transported to CT 

## 2016-09-10 NOTE — ED Triage Notes (Signed)
Pt reports recent distention and went to dr, treated for constipation. Reports now having regular bowel movements, last one this am but feels like abd is now distended again and swelling to his feet. Denies sob.

## 2016-09-10 NOTE — ED Notes (Signed)
Attempted 16 fr foley.  Could  Not advance foley entirely.  I was informed ahead of time that he has a swollen prostate so I did not force it.    After attempting the foley, the pt had the urge to void and released approx 25 cc's of urine.  Pt also felt like he needed to move his bowels.  Placed pt on bedpan.  His bottom was dirty but no stool was present in bedpan.

## 2016-09-10 NOTE — ED Notes (Signed)
Patient notified again that a urine sample is needed.

## 2016-09-10 NOTE — ED Notes (Signed)
RN assisted tech Bonna Gains with foley insertion. Insertion unsuccessful. EDP made aware.

## 2016-09-10 NOTE — H&P (Signed)
Date: 09/10/2016         Patient Name:  Ian Hill MRN: [id number]  DOB: 03/20/1947 Age / Sex: 66 y.o., male   PCP: [name], MD         Medical Service: Internal Medicine Teaching Service         Attending Physician: Dr. Bartholomew Crews, MD    First Contact: Dr. Holley Raring Pager: [phone]  Second Contact: Dr. Berline Lopes Pager: [phone]       After Hours (After 5p /  First Contact Pager: [phone]  Weekends / Holidays): Second Contact Pager: [phone]   Chief Complaint: abdominal pain  History of Present Illness: Ian Hill is a 66 y.o. male with a h/o of HFpEF and HTN who presents with difficulty urinating x 2 weeks, abdominal distention and pain.  Patient was previously seen in the internal medicine clinic with complaint of urinary retention, incomplete voiding, overflow incontinence and placed on finasteride on 08/29/2016. Patient presented several days later with complaint of abdominal pain and plain film showed large toe pertinent. Patient was treated at that time with bowel regimen for constipation. Since then patient reports that he has had worsening abdominal distention and also complains of lower extremity swelling. Patient reports that his constipation has significantly improved since initiation of the bowel regimen prescribed to him. He also notes that his urinary symptoms have not significantly improved since his visit to the internal medicine clinic, but he has had mild increase in UOP on finasteride. Patient denies any chest pain or shortness of breath. Patient denies any fevers, chills, pain in his lower extremities, dizziness, lightheadedness.  In the emergency department, patient was evaluated with plain film chest x-ray which was unremarkable for pulmonary edema. He also received abdominal plain film which showed moderate stool burn as well as concern for abdominal mass. This was further qualified with CT abdomen and pelvis which showed  significant distended urinary bladder secondary to enlarged prostate resulting in bilateral hydronephrosis and hydroureter. Foley placement was attempted and was unsuccessful initially. Urology was consulted for Foley placement as well as further evaluation and management of BPH and urinary retention.  Patient was also noted to have a KI serum creatinine to 2.3 up from his baseline of 1.2-1.3. He also had an elevated BNP of 1900. Internal medicine teaching service was consulted for admission  Meds: Current Outpatient Prescriptions  Medication Sig Dispense Refill  . atenolol (TENORMIN) 25 MG tablet Take 1 tablet (25 mg total) by mouth daily. 90 tablet 2  . finasteride (PROSCAR) 5 MG tablet Take 1 tablet (5 mg total) by mouth daily. 90 tablet 1  . hydrochlorothiazide (HYDRODIURIL) 25 MG tablet Take 1 tablet (25 mg total) by mouth daily. 90 tablet 2  . senna-docusate (SENOKOT-S) 8.6-50 MG tablet Take 2 tablets by mouth daily. 30 tablet 0  . simvastatin (ZOCOR) 40 MG tablet Take 1 tablet (40 mg total) by mouth at bedtime. 90 tablet 2  . psyllium (METAMUCIL SMOOTH TEXTURE) 28 % packet Take 1 packet by mouth 2 (two) times daily. (Patient not taking: Reported on 09/10/2016) 60 packet 0   Allergies: Allergies as of 09/10/2016  . (No Known Allergies)   Past Medical History:  Diagnosis Date  . 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 Diastolic dysfunction   . Diverticulosis   . Dyslipidemia   . Elevated PSA   . Hypertension   . Hypertr obst cardiomyop   . Mitral regurgitation    Family History: Pt family  history is not on file.  Social History: Pt  reports that he has been smoking Cigarettes.  He has been smoking about 0.20 packs per day. He has never used smokeless tobacco. He reports that he does not drink alcohol or use drugs.  Review of Systems: A complete ROS was negative except as per HPI. Review of Systems  Constitutional: Negative for chills, fever and weight loss.  Eyes: Negative for blurred  vision.  Respiratory: Negative for cough and shortness of breath.   Cardiovascular: Negative for chest pain and leg swelling.  Gastrointestinal: Positive for abdominal pain and constipation. Negative for diarrhea, nausea and vomiting.  Genitourinary: Positive for urgency. Negative for dysuria and frequency.  Musculoskeletal: Negative for myalgias.  Skin: Negative for rash.  Neurological: Negative for dizziness, tremors and headaches.  Endo/Heme/Allergies: Negative for polydipsia.  Psychiatric/Behavioral: The patient is not nervous/anxious.    Physical Exam: Vitals:   09/10/16 1230 09/10/16 1315 09/10/16 1400 09/10/16 1445  BP: (!) 143/86 (!) 147/96 (!) 148/80 (!) 145/77  Pulse: 70  91   Resp: (!) 26   18  Temp:      TempSrc:      SpO2: 100%  99% 100%  Weight:       Physical Exam  Constitutional: [name] oriented to person, place, and time. He appears well-developed and well-nourished. [name] cooperative. No distress.  HENT:  Head: Normocephalic and atraumatic.  Right Ear: Hearing normal.  Left Ear: Hearing normal.  Nose: Nose normal.  Mouth/Throat: Mucous membranes are normal.  Eyes: Pupils are equal, round, and reactive to light.  Neck: JVD (minimal) present.  Cardiovascular: Normal rate, regular rhythm, S1 normal, S2 normal and intact distal pulses.  Exam reveals no gallop.   No murmur heard. Pulmonary/Chest: Effort normal and breath sounds normal. No respiratory distress. He has no wheezes. He has no rhonchi. He has no rales. He exhibits no tenderness.  Abdominal: Soft. Normal appearance. He exhibits no ascites. Bowel sounds are increased. There is no hepatosplenomegaly. There is generalized tenderness (mild). There is guarding (voluntary). There is no rigidity, no rebound and no CVA tenderness.  Musculoskeletal: He exhibits edema (1+).  Neurological: [name] alert and oriented to person, place, and time. He has normal strength.  Skin: Skin is warm, dry and intact. [name] not  diaphoretic.  Psychiatric: He has a normal mood and affect. His speech is normal and behavior is normal.   Labs: CBC:  Recent Labs Lab 09/10/16 0838  WBC 6.3  NEUTROABS 4.0  HGB 12.7*  HCT 38.0*  MCV 88.0  PLT 170   Basic Metabolic Panel:  Recent Labs Lab 09/10/16 0838  NA 138  K 4.4  CL 102  CO2 23  GLUCOSE 104*  BUN 32*  CREATININE 2.32*  CALCIUM 9.3   Cardiac Enzymes:  Recent Labs Lab 09/10/16 0859  TROPIPOC 0.06   BNP (last 3 results)  Recent Labs  09/10/16 0838  BNP 1,903.9*   Liver Function Tests:  Recent Labs Lab 09/10/16 0838  AST 33  ALT 23  ALKPHOS 81  BILITOT 1.1  PROT 7.1  ALBUMIN 3.6   Urinalysis    Component Value Date/Time   COLORURINE YELLOW 09/10/2016 [place] 09/10/2016 1126   LABSPEC 1.015 09/10/2016 1126   PHURINE 5.0 09/10/2016 1126   [place] 09/10/2016 1126   [place] (A) 09/10/2016 [place] 09/10/2016 [place] 09/10/2016 [place] 09/10/2016 1126  NITRITE NEGATIVE 09/10/2016 [place] 09/10/2016 1126   Imaging: EKG Interpretation  Date/Time:  Wednesday Sep 10 2016 08:27:02 EDT Ventricular Rate:  63 PR Interval:    QRS Duration: 108 QT Interval:  436 QTC Calculation: 447 R Axis:   73 Text Interpretation:  Sinus rhythm Multiple premature complexes, vent & supraven Borderline short PR interval Probable left atrial enlargement RSR' in V1 or V2, probably normal variant LVH with secondary repolarization abnormality ST depression, consider ischemia, diffuse lds Anterior ST elevation, probably due to LVH no significant change since Mar 2017 Confirmed by GOLDSTON MD, SCOTT [phone]) on 09/10/2016 9:02:29 AM  Ct Abdomen Pelvis Wo Contrast Result Date: 09/10/2016 CLINICAL DATA:  Constipation. Abdominal distention and difficulty urinating. Current abdominal radiographs suggest a mid to lower abdominal mass displacing bowel.  EXAM: CT ABDOMEN AND PELVIS WITHOUT CONTRAST TECHNIQUE: Multidetector CT imaging of the abdomen and pelvis was performed following the standard protocol without IV contrast. COMPARISON:  Current abdominal and pelvic radiographs. FINDINGS: Lower chest: 12 x 8 mm, average 10 mm, nodule contiguous with the inferior right oblique fissure common the right lower lobe. There is associated linear opacity and fissure retraction consistent with scarring. Linear opacities noted at the left lung base which is likely combination of scarring and subsegmental atelectasis. Minimal right pleural effusion. Heart is mildly enlarged. Small hiatal hernia. Hepatobiliary: No focal liver abnormality is seen. No gallstones, gallbladder wall thickening, or biliary dilatation. Pancreas: Unremarkable. No pancreatic ductal dilatation or surrounding inflammatory changes. Spleen: Normal in size without focal abnormality. Adrenals/Urinary Tract: No adrenal masses. There is mild bilateral hydronephrosis and hydroureter, with no evidence of ureteral or intrarenal stone. There are no renal masses. The bladder is markedly distended extending into the mid to upper abdomen, accounting for the mass noted on the current radiographs. The prostate gland is enlarged and appears to involve the bladder base extend to the region of both of the ureterovesicular junctions. No other bladder abnormality. No bladder stone. Stomach/Bowel: The bowel is displaced by the enlarged bladder. No bowel obstruction or bowel wall thickening or inflammation. Stomach is unremarkable other than the hiatal hernia. Normal appendix is visualized. Vascular/Lymphatic: There is atherosclerotic calcification extends along the normal caliber abdominal aorta into the iliac and femoral vessels. Mild prominent shotty lymph nodes are noted in the inguinal regions, largest on the right measuring 12 mm in short axis. No other adenopathy. Reproductive: Enlarged prostate that appears to invade  the bladder base as described above. Prostate measures 5.9 x 5.8 x 6.0 cm. Other: Trace ascites in the posterior pelvic recess.  No hernia. There is subcutaneous edema along the right lower extremity. This could be due to extrinsic compression of the right iliac vein. Venous thrombosis should be considered if there are consistent clinical findings. Musculoskeletal: No fracture or acute finding. No osteoblastic or osteolytic lesions. IMPRESSION: 1. No mass noted radiographically is a markedly enlarged bladder. The cause of the bladder distention is from an enlarged prostate, which appears to invade the posterior inferior bladder base extending to the region of both ureteral trigones. This leads to mild bilateral hydronephrosis and hydroureter. 2. Mildly enlarged shotty inguinal lymph nodes. No other adenopathy. There are no osteoblastic bone lesions or other evidence suggesting metastatic prostate carcinoma. 3. Subcutaneous right lower extremity edema. Consider possibility of right lower extremity deep venous thrombosis. 4. 10 mm mean diameter right lower lobe pulmonary nodule. This is associated with apparent scarring some supporting a benign etiology. Neoplastic disease is possible. Consider  one of the following in 3 months for both low-risk and high-risk individuals: (a) repeat chest CT, (b) follow-up PET-CT, or (c) tissue sampling. This recommendation follows the consensus statement: Guidelines for Management of Incidental Pulmonary Nodules Detected on CT Images: From the Fleischner Society 2017; Radiology 2017; 284:228-243. 5. Aortic atherosclerosis. These results were discussed by telephone at the time of interpretation on 09/10/2016 at 1:14 pm with Dr. Sherwood Gambler . Electronically Signed   By: Lajean Manes M.D.   On: 09/10/2016 13:15   Dg Chest 2 View Result Date: 09/10/2016 CLINICAL DATA:  Abdominal distension, dyspnea, history of mitral regurgitation, current smoker. EXAM: CHEST  2 VIEW COMPARISON:   Chest x-ray of October 27, 2014 FINDINGS: The lungs are well-expanded. The interstitial markings are coarse though stable. There is stable minimal blunting of the right lateral costophrenic angle. The cardiac silhouette is mildly enlarged but stable. The pulmonary vascularity is not engorged. There is calcification in the wall of the aortic arch. The bony thorax exhibits no acute abnormality. IMPRESSION: Chronic bronchitic -smoking related changes. Stable mild cardiomegaly. No pulmonary vascular congestion or pulmonary edema. Electronically Signed   By: David  Martinique M.D.   On: 09/10/2016 09:26   Dg Abd 2 Views Result Date: 09/10/2016 CLINICAL DATA:  Recent episode of constipation with subsequent improvement after treatment but now with recurrent abdominal distension. EXAM: ABDOMEN - 2 VIEW COMPARISON:  KUB of September 01, 2016 FINDINGS: The colonic stool burden is moderate. There is no small or large bowel obstructive pattern. No free extraluminal gas collections are observed. No gas is demonstrated in the pelvis. The bowel appears draped over a rounded mid to lower abdominal mass-like density. The bony structures are unremarkable. IMPRESSION: Moderately increased colonic stool burden may reflect mild constipation. There is no evidence of bowel obstruction. Given the abdominal distension and the draping of colon over a curvilinear soft tissue density in the mid and lower abdomen, abdominal and pelvic CT scanning is recommended to exclude an abdominal mass. Electronically Signed   By: David  Martinique M.D.   On: 09/10/2016 09:29   Assessment & Plan by Problem: Active Problems:   Volume overload  Ian Hill is a 66 y.o. male with HFpEF and HTN who presents with abd distention, pain, and urinary retention found to be 2/2 significant BPH.  1) Urinary Retention: Several week history of urinary symptoms, now with significant bladder distention bilateral hydroureter and hydronephrosis and systemic  hypervolemia. Significant BPH noted on CT scan. Urology was consulted for Foley decompression and further recs on management. Plan for bladder decompression, would recommend starting tamsulosin, possibly in addition to finasteride. Patient with history of elevated PSA in 2012 of 9.7, chart review reveals prostate biopsy in 2011 which was negative. No history of urinary symptoms in the past. Has AKI, likely post-renal obstruction. Also with elevated BNP suggesting intravascular hypervolemia which is most likely 2/2 his urinary obstruction. - admit for observation - Urology c/s, appreciate recs - foley decompression - start tamsulosin, continue finasteride  2) HTN: Pt with borderline HTN of 140-150/80-90 on presentation in the setting of acute abd pain and hypervolemia. Home meds: HCTZ and atenolol. - continue home meds  3) HFpEF: Last echo 2014 with G2DD. Now with elevated BNP in the setting of urinary retention. No SOB or suggestion of pulm edema on CXR, JVD mild suggesting intravascular volume up. Would favor resolving urinary retention and reassessing clinical volume status prior to repeating echo.  DVT PPx - low molecular weight  heparin  Code Status - Full  Consults Placed - Urology  Dispo: Admit patient to Observation with expected length of stay less than 2 midnights.  Signed: Holley Raring, MD 09/10/2016, 3:04 PM  Pager: [phone]

## 2016-09-11 ENCOUNTER — Telehealth: Payer: Self-pay

## 2016-09-11 ENCOUNTER — Encounter (HOSPITAL_COMMUNITY): Payer: Self-pay | Admitting: General Practice

## 2016-09-11 DIAGNOSIS — N401 Enlarged prostate with lower urinary tract symptoms: Secondary | ICD-10-CM | POA: Diagnosis not present

## 2016-09-11 DIAGNOSIS — R338 Other retention of urine: Secondary | ICD-10-CM

## 2016-09-11 DIAGNOSIS — I10 Essential (primary) hypertension: Secondary | ICD-10-CM | POA: Diagnosis not present

## 2016-09-11 DIAGNOSIS — E8770 Fluid overload, unspecified: Secondary | ICD-10-CM

## 2016-09-11 LAB — BASIC METABOLIC PANEL
Anion gap: 8 (ref 5–15)
BUN: 27 mg/dL — AB (ref 6–20)
CO2: 25 mmol/L (ref 22–32)
CREATININE: 1.79 mg/dL — AB (ref 0.61–1.24)
Calcium: 8.6 mg/dL — ABNORMAL LOW (ref 8.9–10.3)
Chloride: 104 mmol/L (ref 101–111)
GFR calc Af Amer: 42 mL/min — ABNORMAL LOW (ref >60)
GFR calc non Af Amer: 36 mL/min — ABNORMAL LOW (ref >60)
Glucose, Bld: 92 mg/dL (ref 65–99)
Potassium: 4.3 mmol/L (ref 3.5–5.1)
Sodium: 137 mmol/L (ref 135–145)

## 2016-09-11 LAB — TROPONIN I
Troponin I: 0.06 ng/mL (ref <0.03)
Troponin I: 0.06 ng/mL (ref <0.03)

## 2016-09-11 MED ORDER — POLYETHYLENE GLYCOL 3350 17 G PO PACK: 17.0000 g | PACK | Freq: Every day | ORAL | 0 refills | Status: DC | PRN

## 2016-09-11 MED ORDER — ENSURE ENLIVE PO LIQD: 237.0000 mL | Freq: Two times a day (BID) | ORAL | 12 refills | Status: DC

## 2016-09-11 MED ORDER — ENSURE ENLIVE PO LIQD: 237.0000 mL | Freq: Two times a day (BID) | ORAL | Status: DC

## 2016-09-11 MED ORDER — TAMSULOSIN HCL 0.4 MG PO CAPS: 0.4000 mg | ORAL_CAPSULE | Freq: Every day | ORAL | 0 refills | Status: DC

## 2016-09-11 NOTE — Discharge Instructions (Signed)
It was pleasure taking care of you. We are holding blood pressure medicines called atenolol and hydrochlorothiazide on discharge because of your low blood pressure during hospitalization. Your blood pressure will be reassessed during your follow-up appointment in our clinic on May 8 at 2:45 PM-at that time your doctor can restart medications if needed. You are being discharged home with catheter-this catheter will stay in place for approximately 2 weeks. Please follow the instructions to take care it. He will see a urologist at [facility] urology on May and at 8:30 AM and then they can provide you more specific instructions about your catheter. Please follow-up with both of your appointments.

## 2016-09-11 NOTE — Progress Notes (Signed)
Subjective: Currently, the patient is feeling much better, back to baseline. HR and BP normal w/o meds. Understands Urology f/u and will have foley care instructions prior to DC.  Interval Events: UOP brisk initially after foley, now slowing. Euvolemic on exam. AKI resolving.  Objective: Vital signs in last 24 hours: Vitals:   09/10/16 1445 09/10/16 1713 09/10/16 2333 09/11/16 1037  BP: (!) 145/77 (!) 142/60 (!) 109/54 109/61  Pulse:  88 65 62  Resp: 18 17 18    Temp:  98.5 F (36.9 C) 99 F (37.2 C)   TempSrc:   Oral   SpO2: 100% (!) 76% 100%   Weight:  129 lb 10.1 oz (58.8 kg)    Height:  5\' 11"  (1.803 m)     Intake/Output:  05/02 0701 - 05/03 0700 In: -  Out: 4500 [Urine:4500]    Physical Exam: Physical Exam  Constitutional: [name] oriented to person, place, and time. No distress.  Cardiovascular: Normal rate, regular rhythm and normal heart sounds.   Pulmonary/Chest: Effort normal and breath sounds normal.  Abdominal: Soft. Bowel sounds are normal. There is no tenderness.  Musculoskeletal: He exhibits no edema.  Neurological: [name] alert and oriented to person, place, and time.   Labs: CBC:  Recent Labs Lab 09/10/16 0838  WBC 6.3  NEUTROABS 4.0  HGB 12.7*  HCT 38.0*  MCV 88.0  PLT 027   Metabolic Panel:  Recent Labs Lab 09/10/16 0838 09/10/16 1956 09/11/16 0550  NA 138 137 137  K 4.4 4.7 4.3  CL 102 102 104  CO2 23 23 25   GLUCOSE 104* 71 92  BUN 32* 31* 27*  CREATININE 2.32* 2.07* 1.79*  CALCIUM 9.3 8.8* 8.6*  MG  --  2.4  --   ALT 23  --   --   ALKPHOS 81  --   --   BILITOT 1.1  --   --   PROT 7.1  --   --   ALBUMIN 3.6  --   --    Cardiac Labs:  Recent Labs Lab 09/10/16 0838 09/10/16 0859 09/10/16 1825 09/11/16 0019 09/11/16 0550  TROPIPOC  --  0.06  --   --   --   TROPONINI  --   --  0.05* 0.06* 0.06*  BNP 1,903.9*  --   --   --   --     Medications: Scheduled Medications: . atenolol  25 mg Oral Daily  . enoxaparin  (LOVENOX) injection  30 mg Subcutaneous Q24H  . feeding supplement (ENSURE ENLIVE)  237 mL Oral BID BM  . finasteride  5 mg Oral Daily  . senna-docusate  2 tablet Oral Daily  . simvastatin  40 mg Oral QHS  . tamsulosin  0.4 mg Oral Daily   PRN Medications: polyethylene glycol  Assessment/Plan: Pt is a 66 y.o. yo male with a PMHx of HFpEF who was admitted on 09/10/2016 with symptoms of urinary retention and abd pain, which was determined to be secondary to BPH. Interventions at this time will be focused on foley decompression and f/u with Urology.  1) Urinary Retention: resolved. AKI improving with SCr now 1.7. Electrolytes stable. Will have Urology f/u. - foley decompression - continue tamsulosin and finasteride  2) HTN: hold home HCTZ and atenolol for normotensive BP and low-normal HR. Will readdress at PCP f/u.  3) HFpEF: Euvolemic on exam. LE edema resolved today.  Length of Stay: 1 day(s) Dispo: Anticipated discharge today.  Holley Raring, MD Pager: [phone] (  7AM-5PM) 09/11/2016, 11:51 AM

## 2016-09-11 NOTE — Progress Notes (Signed)
Ian Hill to be D/C'd to home per MD order.  Discussed with the patient and all questions fully answered.  VSS, Skin clean, dry and intact without evidence of skin break down, no evidence of skin tears noted. IV catheter discontinued intact. Site without signs and symptoms of complications. Dressing and pressure applied.  An After Visit Summary was printed and given to the patient. Patient received prescriptions, and leg bag.  D/c education completed with patient/family including follow up instructions, medication list, d/c activities limitations if indicated, with other d/c instructions as indicated by MD - patient able to verbalize understanding, all questions fully answered.   Patient instructed to return to ED, call 911, or call MD for any changes in condition.   Patient escorted via [place], and D/C home via private auto.  Ian Hill 09/11/2016 1:15 PM

## 2016-09-11 NOTE — Discharge Summary (Signed)
Name: Ian Hill MRN: [id number] DOB: 03/20/1947 66 y.o. PCP: [name], MD  Date of Admission: 09/10/2016  8:11 AM Date of Discharge: 09/11/2016 Attending Physician: Bartholomew Crews, MD  Discharge Diagnosis: Active Problems:   Volume overload   Urinary retention  Discharge Medications: Allergies as of 09/11/2016   No Known Allergies     Medication List    STOP taking these medications   atenolol 25 MG tablet Commonly known as:  TENORMIN   hydrochlorothiazide 25 MG tablet Commonly known as:  HYDRODIURIL   psyllium 28 % packet Commonly known as:  METAMUCIL SMOOTH TEXTURE     TAKE these medications   feeding supplement (ENSURE ENLIVE) Liqd Take 237 mLs by mouth 2 (two) times daily between meals.   finasteride 5 MG tablet Commonly known as:  PROSCAR Take 1 tablet (5 mg total) by mouth daily.   polyethylene glycol packet Commonly known as:  MIRALAX / GLYCOLAX Take 17 g by mouth daily as needed for mild constipation.   senna-docusate 8.6-50 MG tablet Commonly known as:  Senokot-S Take 2 tablets by mouth daily.   simvastatin 40 MG tablet Commonly known as:  ZOCOR Take 1 tablet (40 mg total) by mouth at bedtime.   tamsulosin 0.4 MG Caps capsule Commonly known as:  FLOMAX Take 1 capsule (0.4 mg total) by mouth daily. Start taking on:  09/12/2016       Disposition and follow-up:   [name] was discharged from [facility] in Good condition.  At the hospital follow up visit please address:  1.  BPH / Urinary Retention: assess for issues with foley, compliance with tamsulosin and finasteride. Ensure f/u with Urology. HTN: Meds held at DC. Assess BP and HR, restart meds if appropriate.  2.  Labs / imaging needed at time of follow-up: BMP for Scr and electrolytes  Follow-up Appointments: Follow-up Information    Jorja Loa, MD. Go on 09/18/2016.   Specialty:  Urology Why:  At 8:30 AM Contact information: [place] [address] [phone]        [name], MD. Go on 09/16/2016.   Specialty:  Internal Medicine Why:  At 2:45 PM Contact information: [place] [id number] [place] [facility] Course by problem list: Active Problems:   Volume overload   Urinary retention   1. Urinary retention secondary to BPH c/b AKI: Patient presented with several week history of urinary retention, overflow incontinence, abdominal distention, abdominal pain. Patient had been seen in the outpatient clinic for similar complaints and was started on finasteride at that time. Laboratory evaluation then revealed a KI with serum creatinine to 2.4 up from his baseline of 1.2-1.3. He then presented the emergency department for similar symptoms when he began to notice lower extremity swelling. In the emergency department he was found to have bladder scan with greater than a liter of urine. Abdominal plain films showed moderate stool burden and concern for abdominal mass. This was further evaluated with CT abdomen and pelvis which showed no abdominal mass but did show significant distended bladder with bilateral hydroureter and mild hydronephrosis. CT scan also revealed significant BPH which is likely the cause of urinary retention. Foley catheter was inserted and urology was consulted. Urology recommended bladder decompression with Foley for several weeks until outpatient follow-up for further management of BPH. Patient had significant auto-diuresis and electrolytes were monitored and remained stable.  Patient's lower extremity edema resolved after urinary obstruction was resolved. Patient's creatinine trended down without further intervention and was 1.7 at the time of discharge. Patient was encouraged to continue oral hydration.  2. Hypertension: Patient presented with mild hypertension in the setting of acute abdominal pain and not having taken his medication that morning.  Patient is on home regimen of HCTZ and atenolol. His medications were held during his hospitalization and patient remained normotensive with normal heart rate. He was discharged without these medications to outpatient follow-up where they can be titrated as needed.  Discharge Vitals:   BP 109/61 (BP Location: Left Arm)   Pulse 62   Temp 99 F (37.2 C) (Oral)   Resp 18   Ht 5\' 11"  (1.803 m)   Wt 129 lb 10.1 oz (58.8 kg)   SpO2 100%   BMI 18.08 kg/m   Pertinent Labs, Studies, and Procedures:  Recent Labs Lab 09/10/16 0838 09/10/16 1956 09/11/16 0550  CREATININE 2.32* 2.07* 1.79*   Procedures Performed:  Ct Abdomen Pelvis Wo Contrast Result Date: 09/10/2016 IMPRESSION: 1. No mass noted radiographically is a markedly enlarged bladder. The cause of the bladder distention is from an enlarged prostate, which appears to invade the posterior inferior bladder base extending to the region of both ureteral trigones. This leads to mild bilateral hydronephrosis and hydroureter. 2. Mildly enlarged shotty inguinal lymph nodes. No other adenopathy. There are no osteoblastic bone lesions or other evidence suggesting metastatic prostate carcinoma. 3. Subcutaneous right lower extremity edema. Consider possibility of right lower extremity deep venous thrombosis. 4. 10 mm mean diameter right lower lobe pulmonary nodule. This is associated with apparent scarring some supporting a benign etiology. Neoplastic disease is possible. Consider one of the following in 3 months for both low-risk and high-risk individuals: (a) repeat chest CT, (b) follow-up PET-CT, or (c) tissue sampling.   Dg Chest 2 View Result Date: 09/10/2016  IMPRESSION: Chronic bronchitic -smoking related changes. Stable mild cardiomegaly. No pulmonary vascular congestion or pulmonary edema.  Dg Abd 1 View Dg Abd 2 Views Result Date: 09/10/2016 IMPRESSION: Moderately increased colonic stool burden may reflect mild constipation. There is no  evidence of bowel obstruction. Given the abdominal distension and the draping of colon over a curvilinear soft tissue density in the mid and lower abdomen, abdominal and pelvic CT scanning is recommended to exclude an abdominal mass.   Consultations: Treatment Team:  Franchot Gallo, MD  Discharge Instructions: Discharge Instructions    Diet - low sodium heart healthy    Complete by:  As directed    Discharge instructions    Complete by:  As directed    It was pleasure taking care of you. We are holding blood pressure medicines called atenolol and hydrochlorothiazide on discharge because of your low blood pressure during hospitalization. Your blood pressure will be reassessed during your follow-up appointment in our clinic on May 8 at 2:45 PM-at that time your doctor can restart medications if needed. You are being discharged home with catheter-this catheter will stay in place for approximately 2 weeks. Please follow the instructions to take care it. He will see a urologist at [facility] on May and at 8:30 AM and then they can provide you more specific instructions about your catheter. Please follow-up with both of your appointments.   Increase activity slowly    Complete by:  As directed       Signed: Holley Raring, MD 09/11/2016, 12:10 PM   Pager: [phone]

## 2016-09-11 NOTE — Progress Notes (Signed)
Date: 09/11/2016  Patient name: Ian Hill  Medical record number: [id number]  Date of birth: 03/20/1947   I have seen and evaluated Larwance Rote and discussed their care with the Residency Team. Mr Carlile is a 66 yo male with H/O HTN with a chief complaint of abdominal pain and lower extremity edema bilaterally. He came to the clinic on April 20 complaining of frequent urination and dribbling. He was started on finasteride. He then returned on April 23 with abdominal bloating and diarrhea. We got a plain film that showed increased stool burden. He was treated for constipation. He then came to the emergency department on May 2 with continued urinary symptoms of retention, incomplete voiding, and overflow incontinence. His constipation has gotten better.  In the ED, the patient had a CT scan that showed a significantly distended urinary bladder due to an enlarged prostate with bilateral hydronephrosis and hydroureter. Urology placed a Foley catheter and the patient had approximately 2 L of urine which immediately drained.  This morning, the patient feels much better and got an excellent night's sleep. [name] eating and drinking normally. The swelling in his legs has completely resolved. His abdomen feels much better to him. He has no complaints and is ready to go home.  PMHx, Fam Hx, and/or Soc Hx : Past medical history is also pertinent for preserved ejection fraction heart failure. Social history :He lives with his sister. [name] an active smoker.  Vitals:   09/10/16 2333 09/11/16 1037  BP: (!) 109/54 109/61  Pulse: 65 62  Resp: 18   Temp: 99 F (37.2 C)   Gen : Male in no acute distress Abdomen positive bowel sounds soft nontender nondistended No LE edema B  Cr baseline about 1.3; trend 2.44 - 2.32 - 2.07 BNP 1900 UA + RBC  The team and I independently viewed his PA and lateral chest x-ray which showed blunting of the right costophrenic angle but no other changes.  The team  and and I independently viewed his EKG which showed sinus arrhythmia with PAC's and PVC's, normal axis, biphasic P waves in lead V1, inferior and lateral T-wave inversions and ST segment depression. No significant change from prior.  Assessment and Plan: I have seen and evaluated the patient as outlined above. I agree with the formulated Assessment and Plan as detailed in the residents' note, with the following changes: Mr. Nylen is a 66 year old man who has had urinary symptoms consistent with BPH for a couple of weeks in the outpatient setting. He was started on finasteride which would take several weeks to take effect. He had progressive urinary retention which was seen on CT in the ED. Placement of Foley catheter significantly improved his symptoms. Urology has encourage volume repletion with oral intake only for his postobstructive diuresis and following his electrolytes closely. His potassium remains normal and his urine output is decreasing. They agree with finasteride and tamsulosin. The Foley catheter will need to stay in until he is evaluated by urology in the outpatient setting. Etiologies of the urinary retention could include BPH, he is on appropriate treatment for this, or malignancy. The CT scan did not demonstrate any masses within the prostate or evidence of metastatic prostate cancer. He has had a prostate biopsy due to an elevated PSA in July 2011. I cannot find the results of that biopsy. Indication to check PSA currently due to his urinary retention and Foley placement that he'll have outpatient follow-up.   1. Urinary retention due  to BPH - likely DC'd to home today with Foley catheter. Continue tamsulosin and finasteride. Outpatient follow-up with urology. Encourage oral hydration.  Bartholomew Crews, MD 5/3/201811:22 AM

## 2016-09-11 NOTE — Telephone Encounter (Signed)
Hospital TOC per Dr Reesa Chew, discharge 09/11/2016, appt 09/16/2016.

## 2016-09-16 ENCOUNTER — Ambulatory Visit (INDEPENDENT_AMBULATORY_CARE_PROVIDER_SITE_OTHER): Payer: Medicare Other | Admitting: Internal Medicine

## 2016-09-16 ENCOUNTER — Encounter: Payer: Self-pay | Admitting: Internal Medicine

## 2016-09-16 VITALS — BP 152/77 | HR 64 | Temp 97.3°F | Ht 72.0 in | Wt 130.8 lb

## 2016-09-16 DIAGNOSIS — I129 Hypertensive chronic kidney disease with stage 1 through stage 4 chronic kidney disease, or unspecified chronic kidney disease: Secondary | ICD-10-CM | POA: Diagnosis not present

## 2016-09-16 DIAGNOSIS — R911 Solitary pulmonary nodule: Secondary | ICD-10-CM | POA: Diagnosis not present

## 2016-09-16 DIAGNOSIS — N182 Chronic kidney disease, stage 2 (mild): Secondary | ICD-10-CM | POA: Diagnosis not present

## 2016-09-16 DIAGNOSIS — R339 Retention of urine, unspecified: Secondary | ICD-10-CM | POA: Diagnosis not present

## 2016-09-16 DIAGNOSIS — Z5189 Encounter for other specified aftercare: Secondary | ICD-10-CM | POA: Diagnosis not present

## 2016-09-16 DIAGNOSIS — I10 Essential (primary) hypertension: Secondary | ICD-10-CM

## 2016-09-16 DIAGNOSIS — F17210 Nicotine dependence, cigarettes, uncomplicated: Secondary | ICD-10-CM

## 2016-09-16 DIAGNOSIS — Z87448 Personal history of other diseases of urinary system: Secondary | ICD-10-CM

## 2016-09-16 DIAGNOSIS — Z960 Presence of urogenital implants: Secondary | ICD-10-CM

## 2016-09-16 MED ORDER — HYDROCHLOROTHIAZIDE 25 MG PO TABS: 25.0000 mg | ORAL_TABLET | Freq: Every day | ORAL | 1 refills | Status: DC

## 2016-09-16 MED ORDER — METOPROLOL SUCCINATE ER 50 MG PO TB24: 50.0000 mg | ORAL_TABLET | Freq: Every day | ORAL | 11 refills | Status: DC

## 2016-09-16 NOTE — Assessment & Plan Note (Addendum)
Vitals:   09/16/16 1450  BP: (!) 152/77  Pulse: 64  Temp: 97.3 F (36.3 C)   BP remains high today on hctz 25mg  + atenolol 25mg . Has hx of HOCM so would benefit from BBLocker. Will change atenolol to metoprolol XL 50mg  daily.  f/up in 6 weeks

## 2016-09-16 NOTE — Progress Notes (Signed)
   CC: hospital follow up for urinary retention  HPI:  [name] is a 66 y.o. with pmh as listd below is here for HFU for urinary retention  Past Medical History:  Diagnosis Date  . 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 BPH (benign prostatic hyperplasia)   . CKD (chronic kidney disease), stage II   . Diastolic dysfunction   . Diverticulosis   . Dyslipidemia   . Elevated PSA   . Hypertension   . Hypertr obst cardiomyop   . Mitral regurgitation   . Urine retention     Hospitalized 5/1-5/2 for few weeks of urinary symptoms, had significant bladder distension and b/l hydroureter and hydronephrosis, systemic hypervolemia. Significant BPH seen on CT scan. Urology consulted, placed foley, started tamsulosin and finasteride. Had AKI in the hospital (b/l crt 1.2-1.3) but was 2.4 in the hospital. crt trended down on discharge. He had significant diuresis after foley insertion.  Foley is draining well, no blood or puss. No pain or bladder discomfort. Has been taking finasteride only, not flomax. He did not know about the flomax.   HTN - bp meds (atenolol 25mg  + hctz 25mg ) were held on d/c with plan of restarting as needed on follow up. But he has been taking both of them. BP is high today 152/77.   Review of Systems:    Review of Systems  Constitutional: Negative for chills and fever.  Cardiovascular: Negative for chest pain.  Musculoskeletal: Negative for myalgias.  Neurological: Negative for dizziness and headaches.     Physical Exam:  Vitals:   09/16/16 1450  BP: (!) 152/77  Pulse: 64  Temp: 97.3 F (36.3 C)  TempSrc: Oral  Weight: 130 lb 12.8 oz (59.3 kg)  Height: 6' (1.829 m)   Physical Exam  Constitutional: [name] oriented to person, place, and time. He appears well-developed and well-nourished. No distress.  Thin appearing male. NAD  Cardiovascular: Normal rate and regular rhythm.  Exam reveals no gallop and no friction rub.   No murmur heard. Genitourinary:    Genitourinary Comments: Has foley, bag draining clear urine.   Musculoskeletal: Normal range of motion. He exhibits no edema.  Neurological: [name] alert and oriented to person, place, and time. No cranial nerve deficit.  Skin: [name] not diaphoretic.     Assessment & Plan:   See Encounters Tab for problem based charting.  Patient discussed with Dr. Angelia Mould

## 2016-09-16 NOTE — Patient Instructions (Signed)
Start taking Flomax, in addition to proscar.  Continue taking hydrochorothiazide.  Stop taking Atenolol.  Start taking metoprolol XL.  Follow up in 6 weeks with [place].  Follow up with Urology on 5/10.

## 2016-09-16 NOTE — Assessment & Plan Note (Signed)
Has worsening of crt in the hospital 2/2 to hydronephrosis. Was improved on discharge.  Will get BMET today to re-evaluate crt.

## 2016-09-16 NOTE — Assessment & Plan Note (Addendum)
Doing well, no bladder symptoms. Has foley, asked him to start taking flomax in addition to finasteride.  Has f/up in urology on 5/10. Keep foley until then.

## 2016-09-16 NOTE — Assessment & Plan Note (Signed)
CT scan in the hospital showed pulm nodule.  "10 mm mean diameter right lower lobe pulmonary nodule. This is associated with apparent scarring some supporting a benign etiology. Neoplastic disease is possible. Consider one of the following in 3 months for both low-risk and high-risk individuals: (a) repeat chest CT, (b) follow-up PET-CT, or (c) tissue sampling. "  Will order Chest Ct in 3 months.

## 2016-09-17 ENCOUNTER — Telehealth: Payer: Self-pay | Admitting: *Deleted

## 2016-09-17 LAB — BASIC METABOLIC PANEL
BUN / CREAT RATIO: 18 (ref 10–24)
BUN: 20 mg/dL (ref 8–27)
CO2: 23 mmol/L (ref 18–29)
CREATININE: 1.13 mg/dL (ref 0.76–1.27)
Calcium: 9 mg/dL (ref 8.6–10.2)
Chloride: 103 mmol/L (ref 96–106)
GFR calc non Af Amer: 65 mL/min/{1.73_m2} (ref >59)
GFR, EST AFRICAN AMERICAN: 75 mL/min/{1.73_m2} (ref >59)
Glucose: 104 mg/dL — ABNORMAL HIGH (ref 65–99)
Potassium: 4.3 mmol/L (ref 3.5–5.2)
Sodium: 144 mmol/L (ref 134–144)

## 2016-09-17 NOTE — Telephone Encounter (Signed)
Patient's sister Gregary Signs calling saying medications not in pharmacy. Called [facility] & was told meds are ready for pick up. Per pharmacist, patient's sister was giving the wrong birthdate when asked. Advised sister to bring birth certificate when picking up meds. She agreed.

## 2016-09-18 DIAGNOSIS — R338 Other retention of urine: Secondary | ICD-10-CM | POA: Diagnosis not present

## 2016-09-18 DIAGNOSIS — N1339 Other hydronephrosis: Secondary | ICD-10-CM | POA: Diagnosis not present

## 2016-09-23 NOTE — Progress Notes (Signed)
Internal Medicine Clinic Attending  Case discussed with Dr. Ahmed at the time of the visit.  We reviewed the resident's history and exam and pertinent patient test results.  I agree with the assessment, diagnosis, and plan of care documented in the resident's note. 

## 2016-10-08 NOTE — Telephone Encounter (Signed)
Pt seen 10/06/2016 in [facility] for HFU.Despina Hidden Cassady5/30/201810:49 AM

## 2016-10-09 ENCOUNTER — Ambulatory Visit (INDEPENDENT_AMBULATORY_CARE_PROVIDER_SITE_OTHER): Payer: Medicare Other | Admitting: Podiatry

## 2016-10-09 DIAGNOSIS — B351 Tinea unguium: Secondary | ICD-10-CM | POA: Diagnosis not present

## 2016-10-09 DIAGNOSIS — M79676 Pain in unspecified toe(s): Secondary | ICD-10-CM | POA: Diagnosis not present

## 2016-10-09 NOTE — Progress Notes (Signed)
Complaint:  Visit Type: Patient returns to my office for continued preventative foot care services. Complaint: Patient states" my nails have grown long and thick and become painful to walk and wear shoes" . The patient presents for preventative foot care services. Patient is under treatment for prostate.  Podiatric Exam: Vascular: dorsalis pedis and posterior tibial pulses are palpable bilateral. Capillary return is immediate. Temperature gradient is WNL. Skin turgor WNL  Sensorium: Normal Semmes Weinstein monofilament test. Normal tactile sensation bilaterally. Nail Exam: Pt has thick disfigured discolored nails with subungual debris noted bilateral entire nail hallux through fifth toenails Ulcer Exam: There is no evidence of ulcer or pre-ulcerative changes or infection. Orthopedic Exam: Muscle tone and strength are WNL. No limitations in general ROM. No crepitus or effusions noted. Foot type and digits show no abnormalities. Bony prominences are unremarkable. Skin: No Porokeratosis. No infection or ulcers  Diagnosis:  Onychomycosis, , Pain in right toe, pain in left toes  Treatment & Plan Procedures and Treatment: Consent by patient was obtained for treatment procedures. The patient understood the discussion of treatment and procedures well. All questions were answered thoroughly reviewed. Debridement of mycotic and hypertrophic toenails, 1 through 5 bilateral and clearing of subungual debris. No ulceration, no infection noted.  Return Visit-Office Procedure: Patient instructed to return to the office for a follow up visit 3 months for continued evaluation and treatment.    Gardiner Barefoot DPM

## 2016-10-13 ENCOUNTER — Other Ambulatory Visit: Payer: Self-pay | Admitting: Internal Medicine

## 2016-10-13 DIAGNOSIS — R338 Other retention of urine: Secondary | ICD-10-CM | POA: Diagnosis not present

## 2016-10-13 DIAGNOSIS — N401 Enlarged prostate with lower urinary tract symptoms: Secondary | ICD-10-CM | POA: Diagnosis not present

## 2016-10-20 DIAGNOSIS — N130 Hydronephrosis with ureteropelvic junction obstruction: Secondary | ICD-10-CM | POA: Diagnosis not present

## 2016-10-20 DIAGNOSIS — R972 Elevated prostate specific antigen [PSA]: Secondary | ICD-10-CM | POA: Diagnosis not present

## 2016-10-20 DIAGNOSIS — R338 Other retention of urine: Secondary | ICD-10-CM | POA: Diagnosis not present

## 2016-10-20 DIAGNOSIS — N401 Enlarged prostate with lower urinary tract symptoms: Secondary | ICD-10-CM | POA: Diagnosis not present

## 2016-10-21 ENCOUNTER — Other Ambulatory Visit: Payer: Self-pay | Admitting: Urology

## 2016-11-03 DIAGNOSIS — R338 Other retention of urine: Secondary | ICD-10-CM | POA: Diagnosis not present

## 2016-11-10 ENCOUNTER — Other Ambulatory Visit: Payer: Self-pay | Admitting: Internal Medicine

## 2016-11-10 DIAGNOSIS — I10 Essential (primary) hypertension: Secondary | ICD-10-CM

## 2016-11-11 NOTE — Progress Notes (Signed)
After review of 2016 cardiology note-spoke with Dr Sabra Heck.( anesthesia)Cardiac clearance will be needed prior to procedure.Message left with [facility].

## 2016-11-11 NOTE — Progress Notes (Signed)
Spoke with pt's sister -An appointment with Dr Stanford Breed has been made for 11/18/16 @ 0820.

## 2016-11-13 ENCOUNTER — Encounter (HOSPITAL_BASED_OUTPATIENT_CLINIC_OR_DEPARTMENT_OTHER): Payer: Self-pay | Admitting: *Deleted

## 2016-11-13 ENCOUNTER — Telehealth: Payer: Self-pay | Admitting: Cardiology

## 2016-11-13 NOTE — Telephone Encounter (Signed)
Received records from [facility] for appointment on 11/18/16 with Dr Stanford Breed.  Records put with Dr Jacalyn Lefevre schedule for 11/18/16. lp

## 2016-11-17 NOTE — Progress Notes (Signed)
HPI: FU hypertrophic cardiomyopathy. Abdominal ultrasound in August of 2011 showed no aneurysm. Carotid Dopplers showed 0-39% bilateral stenosis. Holter monitor showed nonsustained ventricular tachycardia (7 beats). Stress echocardiogram showed possible inferior hypokinesis but systolic blood pressure decreased with exercise. We discussed referral for ICD but the family ultimately declined understanding risk of sudden cardiac death. Last echocardiogram in 02-13-2013 showed normal LV function, grade 2 diastolic dysfunction, mild biatrial enlargement, mild aortic and mitral regurgitation. There is probable apical variant cardiomyopathy. Pt scheduled for TURP and we were asked to evaluate preoperatively. Since I last saw him 4/16, he denies dyspnea, chest pain, palpitations or syncope.  Current Outpatient Prescriptions  Medication Sig Dispense Refill  . atenolol (TENORMIN) 25 MG tablet Take 25 mg by mouth daily.    . feeding supplement, ENSURE ENLIVE, (ENSURE ENLIVE) LIQD Take 237 mLs by mouth 2 (two) times daily between meals. 237 mL 12  . finasteride (PROSCAR) 5 MG tablet Take 1 tablet (5 mg total) by mouth daily. 90 tablet 1  . hydrochlorothiazide (HYDRODIURIL) 25 MG tablet TAKE 1 TABLET BY MOUTH EVERY DAY 90 tablet 2  . metoprolol succinate (TOPROL XL) 50 MG 24 hr tablet Take 1 tablet (50 mg total) by mouth daily. Take with or immediately following a meal. 30 tablet 11  . polyethylene glycol (MIRALAX / GLYCOLAX) packet Take 17 g by mouth daily as needed for mild constipation. 14 each 0  . senna-docusate (SENOKOT-S) 8.6-50 MG tablet Take 2 tablets by mouth daily. 30 tablet 0  . simvastatin (ZOCOR) 40 MG tablet Take 1 tablet (40 mg total) by mouth at bedtime. 90 tablet 2  . tamsulosin (FLOMAX) 0.4 MG CAPS capsule TAKE 1 CAPSULE BY MOUTH EVERY DAY 30 capsule 0   No current facility-administered medications for this visit.      Past Medical History:  Diagnosis Date  . BPH (benign  prostatic hyperplasia)   . CKD (chronic kidney disease), stage II   . Diverticulosis   . Dyslipidemia   . Elevated PSA   . Hypertension   . Hypertrophic obstructive cardiomyopathy with diastolic heart failure ([place])   . Mitral regurgitation   . Urine retention     Past Surgical History:  Procedure Laterality Date  . aspergilloma resection     LUL, s/p resection Feb 1991    Social History   Social History  . Marital status: Single    Spouse name: N/A  . Number of children: N/A  . Years of education: N/A   Occupational History  . Not on file.   Social History Main Topics  . Smoking status: Current Some Day Smoker    Packs/day: 0.20    Types: Cigarettes  . Smokeless tobacco: Never Used     Comment: two cigs a day  . Alcohol use No  . Drug use: No  . Sexual activity: Not on file   Other Topics Concern  . Not on file   Social History Narrative  . No narrative on file    History reviewed. No pertinent family history.  ROS: no fevers or chills, productive cough, hemoptysis, dysphasia, odynophagia, melena, hematochezia, dysuria, hematuria, rash, seizure activity, orthopnea, PND, pedal edema, claudication. Remaining systems are negative.  Physical Exam: Well-developed well-nourished in no acute distress.  Skin is warm and dry.  HEENT is normal.  Neck is supple.  Chest is clear to auscultation with normal expansion.  Cardiovascular exam is regular rate and rhythm. 2/6 systolic murmur left sternal border. Abdominal  exam nontender or distended. No masses palpated. Extremities show no edema. neuro grossly intact  ECG- 09/10/2016-sinus rhythm with PACs, left ventricular hypertrophy with repolarization abnormality. personally reviewed  A/P  1 Hypertrophic obstructive cardiomyopathy-we will continue with beta blockade. They have previously made the decision not to pursue ICD. Repeat echocardiogram.   2 preoperative evaluation prior to transurethral resection of  prostate- A previous stress echo with possible inferior ischemia. I will arrange a [place] nuclear study for risk stratification preoperatively. Given his history of hypertrophic cardiomyopathy his risk will be increased but if nuclear study low risk he may proceed.  3 tobacco abuse-patient counseled on discontinuing.  4 hypertension-blood pressure is controlled. Patient on 2 beta blockers. Discontinue atenolol. Increase Toprol to 75 mg daily.   5 hyperlipidemia-continue management per primary care.  Kirk Ruths, MD

## 2016-11-18 ENCOUNTER — Telehealth (HOSPITAL_COMMUNITY): Payer: Self-pay

## 2016-11-18 ENCOUNTER — Encounter: Payer: Self-pay | Admitting: Cardiology

## 2016-11-18 ENCOUNTER — Ambulatory Visit (INDEPENDENT_AMBULATORY_CARE_PROVIDER_SITE_OTHER): Payer: Medicare Other | Admitting: Cardiology

## 2016-11-18 VITALS — BP 132/66 | HR 56 | Ht 72.0 in | Wt 122.0 lb

## 2016-11-18 DIAGNOSIS — I10 Essential (primary) hypertension: Secondary | ICD-10-CM | POA: Diagnosis not present

## 2016-11-18 DIAGNOSIS — Z720 Tobacco use: Secondary | ICD-10-CM

## 2016-11-18 DIAGNOSIS — Z01810 Encounter for preprocedural cardiovascular examination: Secondary | ICD-10-CM

## 2016-11-18 DIAGNOSIS — I421 Obstructive hypertrophic cardiomyopathy: Secondary | ICD-10-CM

## 2016-11-18 MED ORDER — METOPROLOL SUCCINATE ER 25 MG PO TB24: 75.0000 mg | ORAL_TABLET | Freq: Every day | ORAL | 11 refills | Status: DC

## 2016-11-18 NOTE — Patient Instructions (Signed)
Medication Instructions:   STOP ATENOLOL  INCREASE METOPROLOL TO 75 MG ONCE DAILY= 3 OF THE 25 MG TABLETS ONCE DAILY  Testing/Procedures:  Your physician has requested that you have an echocardiogram. Echocardiography is a painless test that uses sound waves to create images of your heart. It provides your doctor with information about the size and shape of your heart and how well your heart's chambers and valves are working. This procedure takes approximately one hour. There are no restrictions for this procedure.   Your physician has requested that you have a lexiscan myoview. For further information please visit [email or web address]. Please follow instruction sheet, as given.    Follow-Up:  Your physician wants you to follow-up in: [place] will receive a reminder letter in the mail two months in advance. If you don't receive a letter, please call our office to schedule the follow-up appointment.   If you need a refill on your cardiac medications before your next appointment, please call your pharmacy.

## 2016-11-18 NOTE — Telephone Encounter (Signed)
Encounter complete. 

## 2016-11-19 ENCOUNTER — Ambulatory Visit (HOSPITAL_COMMUNITY)
Admission: RE | Admit: 2016-11-19 | Discharge: 2016-11-19 | Disposition: A | Discharge status: Home / Self Care | Payer: Medicare Other | Source: Ambulatory Visit | Attending: Cardiology | Admitting: Cardiology

## 2016-11-19 DIAGNOSIS — N183 Chronic kidney disease, stage 3 (moderate): Secondary | ICD-10-CM | POA: Diagnosis not present

## 2016-11-19 DIAGNOSIS — I5030 Unspecified diastolic (congestive) heart failure: Secondary | ICD-10-CM | POA: Diagnosis not present

## 2016-11-19 DIAGNOSIS — Z8546 Personal history of malignant neoplasm of prostate: Secondary | ICD-10-CM | POA: Diagnosis not present

## 2016-11-19 DIAGNOSIS — F17210 Nicotine dependence, cigarettes, uncomplicated: Secondary | ICD-10-CM | POA: Diagnosis not present

## 2016-11-19 DIAGNOSIS — I130 Hypertensive heart and chronic kidney disease with heart failure and stage 1 through stage 4 chronic kidney disease, or unspecified chronic kidney disease: Secondary | ICD-10-CM | POA: Insufficient documentation

## 2016-11-19 DIAGNOSIS — Z8249 Family history of ischemic heart disease and other diseases of the circulatory system: Secondary | ICD-10-CM | POA: Insufficient documentation

## 2016-11-19 DIAGNOSIS — I421 Obstructive hypertrophic cardiomyopathy: Secondary | ICD-10-CM | POA: Diagnosis not present

## 2016-11-19 LAB — MYOCARDIAL PERFUSION IMAGING
CHL CUP NUCLEAR SDS: 1
CHL CUP NUCLEAR SSS: 2
Peak HR: 93 {beats}/min
Rest HR: 67 {beats}/min
SRS: 1
TID: 1.2

## 2016-11-19 MED ORDER — TECHNETIUM TC 99M TETROFOSMIN IV KIT
32.0000 | PACK | Freq: Once | INTRAVENOUS | Status: AC | PRN
  Administered 2016-11-19: 32 via INTRAVENOUS
  Filled 2016-11-19: qty 32

## 2016-11-19 MED ORDER — TECHNETIUM TC 99M TETROFOSMIN IV KIT
10.6000 | PACK | Freq: Once | INTRAVENOUS | Status: AC | PRN
  Administered 2016-11-19: 10.6 via INTRAVENOUS
  Filled 2016-11-19: qty 11

## 2016-11-19 MED ORDER — REGADENOSON 0.4 MG/5ML IV SOLN
0.4000 mg | Freq: Once | INTRAVENOUS | Status: AC
  Administered 2016-11-19: 0.4 mg via INTRAVENOUS

## 2016-11-20 ENCOUNTER — Ambulatory Visit (HOSPITAL_COMMUNITY)
Admission: RE | Admit: 2016-11-20 | Discharge: 2016-11-20 | Disposition: A | Discharge status: Home / Self Care | Payer: Medicare Other | Source: Ambulatory Visit | Attending: Cardiology | Admitting: Cardiology

## 2016-11-20 ENCOUNTER — Encounter (HOSPITAL_BASED_OUTPATIENT_CLINIC_OR_DEPARTMENT_OTHER): Payer: Self-pay | Admitting: *Deleted

## 2016-11-20 DIAGNOSIS — I420 Dilated cardiomyopathy: Secondary | ICD-10-CM | POA: Diagnosis not present

## 2016-11-20 DIAGNOSIS — I5030 Unspecified diastolic (congestive) heart failure: Secondary | ICD-10-CM | POA: Insufficient documentation

## 2016-11-20 DIAGNOSIS — I083 Combined rheumatic disorders of mitral, aortic and tricuspid valves: Secondary | ICD-10-CM | POA: Insufficient documentation

## 2016-11-20 DIAGNOSIS — I421 Obstructive hypertrophic cardiomyopathy: Secondary | ICD-10-CM | POA: Diagnosis not present

## 2016-11-20 NOTE — Progress Notes (Signed)
  Echocardiogram 2D Echocardiogram has been performed.  Darlina Sicilian M 11/20/2016, 11:49 AM

## 2016-11-21 ENCOUNTER — Encounter (HOSPITAL_BASED_OUTPATIENT_CLINIC_OR_DEPARTMENT_OTHER): Payer: Self-pay | Admitting: *Deleted

## 2016-11-21 NOTE — Progress Notes (Signed)
To [facility] at 0715-Istat on arrival,Ekg with chart-Cardiac clearance,echo note reviewed with Dr Beverly Gust to proceed at [facility] after [place]-will take metoprolol,zocor with small amt water,no smoking.

## 2016-11-24 ENCOUNTER — Encounter (HOSPITAL_COMMUNITY)
Admission: RE | Disposition: A | Discharge status: Home / Self Care | Payer: Self-pay | Source: Ambulatory Visit | Attending: Urology

## 2016-11-24 ENCOUNTER — Ambulatory Visit (HOSPITAL_BASED_OUTPATIENT_CLINIC_OR_DEPARTMENT_OTHER): Payer: Medicare Other | Admitting: Anesthesiology

## 2016-11-24 ENCOUNTER — Observation Stay (HOSPITAL_BASED_OUTPATIENT_CLINIC_OR_DEPARTMENT_OTHER)
Admission: RE | Admit: 2016-11-24 | Discharge: 2016-11-25 | Disposition: A | Discharge status: Home / Self Care | Payer: Medicare Other | Source: Ambulatory Visit | Attending: Urology | Admitting: Urology

## 2016-11-24 ENCOUNTER — Encounter (HOSPITAL_BASED_OUTPATIENT_CLINIC_OR_DEPARTMENT_OTHER): Payer: Self-pay | Admitting: *Deleted

## 2016-11-24 DIAGNOSIS — F17210 Nicotine dependence, cigarettes, uncomplicated: Secondary | ICD-10-CM | POA: Diagnosis not present

## 2016-11-24 DIAGNOSIS — Z79899 Other long term (current) drug therapy: Secondary | ICD-10-CM | POA: Insufficient documentation

## 2016-11-24 DIAGNOSIS — I5030 Unspecified diastolic (congestive) heart failure: Secondary | ICD-10-CM | POA: Insufficient documentation

## 2016-11-24 DIAGNOSIS — R338 Other retention of urine: Secondary | ICD-10-CM | POA: Insufficient documentation

## 2016-11-24 DIAGNOSIS — I421 Obstructive hypertrophic cardiomyopathy: Secondary | ICD-10-CM | POA: Diagnosis not present

## 2016-11-24 DIAGNOSIS — N183 Chronic kidney disease, stage 3 (moderate): Secondary | ICD-10-CM | POA: Diagnosis not present

## 2016-11-24 DIAGNOSIS — E785 Hyperlipidemia, unspecified: Secondary | ICD-10-CM | POA: Diagnosis not present

## 2016-11-24 DIAGNOSIS — N138 Other obstructive and reflux uropathy: Secondary | ICD-10-CM | POA: Diagnosis not present

## 2016-11-24 DIAGNOSIS — C61 Malignant neoplasm of prostate: Secondary | ICD-10-CM | POA: Diagnosis not present

## 2016-11-24 DIAGNOSIS — N401 Enlarged prostate with lower urinary tract symptoms: Secondary | ICD-10-CM | POA: Diagnosis not present

## 2016-11-24 DIAGNOSIS — I130 Hypertensive heart and chronic kidney disease with heart failure and stage 1 through stage 4 chronic kidney disease, or unspecified chronic kidney disease: Secondary | ICD-10-CM | POA: Diagnosis not present

## 2016-11-24 DIAGNOSIS — N182 Chronic kidney disease, stage 2 (mild): Secondary | ICD-10-CM | POA: Diagnosis not present

## 2016-11-24 DIAGNOSIS — R339 Retention of urine, unspecified: Secondary | ICD-10-CM | POA: Diagnosis not present

## 2016-11-24 DIAGNOSIS — I129 Hypertensive chronic kidney disease with stage 1 through stage 4 chronic kidney disease, or unspecified chronic kidney disease: Secondary | ICD-10-CM | POA: Diagnosis not present

## 2016-11-24 HISTORY — DX: Unspecified diastolic (congestive) heart failure: I50.30

## 2016-11-24 HISTORY — DX: Unspecified diastolic (congestive) heart failure: I42.1

## 2016-11-24 HISTORY — PX: TRANSURETHRAL RESECTION OF PROSTATE: SHX73

## 2016-11-24 LAB — POCT I-STAT, CHEM 8
BUN: 22 mg/dL — ABNORMAL HIGH (ref 6–20)
Calcium, Ion: 1.23 mmol/L (ref 1.15–1.40)
Chloride: 99 mmol/L — ABNORMAL LOW (ref 101–111)
Creatinine, Ser: 1.7 mg/dL — ABNORMAL HIGH (ref 0.61–1.24)
Glucose, Bld: 79 mg/dL (ref 65–99)
HEMATOCRIT: 45 % (ref 39.0–52.0)
HEMOGLOBIN: 15.3 g/dL (ref 13.0–17.0)
POTASSIUM: 4.9 mmol/L (ref 3.5–5.1)
SODIUM: 139 mmol/L (ref 135–145)
TCO2: 31 mmol/L (ref 0–100)

## 2016-11-24 SURGERY — TURP (TRANSURETHRAL RESECTION OF PROSTATE)
Anesthesia: General

## 2016-11-24 MED ORDER — SUCCINYLCHOLINE CHLORIDE 200 MG/10ML IV SOSY
PREFILLED_SYRINGE | INTRAVENOUS | Status: DC | PRN
  Administered 2016-11-24: 140 mg via INTRAVENOUS

## 2016-11-24 MED ORDER — OXYCODONE HCL 5 MG PO TABS: 5.0000 mg | ORAL_TABLET | ORAL | Status: DC | PRN

## 2016-11-24 MED ORDER — ACETAMINOPHEN 160 MG/5ML PO SOLN
650.0000 mg | Freq: Four times a day (QID) | ORAL | Status: DC | PRN
  Administered 2016-11-24: 650 mg via ORAL
  Filled 2016-11-24: qty 20.3

## 2016-11-24 MED ORDER — TAMSULOSIN HCL 0.4 MG PO CAPS
0.4000 mg | ORAL_CAPSULE | Freq: Every day | ORAL | Status: DC
  Administered 2016-11-24 – 2016-11-25 (×2): 0.4 mg via ORAL
  Filled 2016-11-24 (×2): qty 1

## 2016-11-24 MED ORDER — ACETAMINOPHEN 160 MG/5ML PO SOLN
ORAL | Status: AC
  Filled 2016-11-24: qty 20.3

## 2016-11-24 MED ORDER — PHENYLEPHRINE 40 MCG/ML (10ML) SYRINGE FOR IV PUSH (FOR BLOOD PRESSURE SUPPORT)
PREFILLED_SYRINGE | INTRAVENOUS | Status: AC
  Filled 2016-11-24: qty 10

## 2016-11-24 MED ORDER — ENSURE ENLIVE PO LIQD
237.0000 mL | Freq: Two times a day (BID) | ORAL | Status: DC
  Administered 2016-11-24 – 2016-11-25 (×2): 237 mL via ORAL

## 2016-11-24 MED ORDER — SIMVASTATIN 40 MG PO TABS: 40.0000 mg | ORAL_TABLET | Freq: Every day | ORAL | Status: DC

## 2016-11-24 MED ORDER — ONDANSETRON HCL 4 MG/2ML IJ SOLN
INTRAMUSCULAR | Status: DC | PRN
  Administered 2016-11-24: 4 mg via INTRAVENOUS

## 2016-11-24 MED ORDER — LIDOCAINE 2% (20 MG/ML) 5 ML SYRINGE
INTRAMUSCULAR | Status: DC | PRN
  Administered 2016-11-24: 100 mg via INTRAVENOUS

## 2016-11-24 MED ORDER — PANTOPRAZOLE SODIUM 40 MG PO TBEC
40.0000 mg | DELAYED_RELEASE_TABLET | Freq: Once | ORAL | Status: DC
  Filled 2016-11-24: qty 1

## 2016-11-24 MED ORDER — ONDANSETRON HCL 4 MG/2ML IJ SOLN
INTRAMUSCULAR | Status: AC
  Filled 2016-11-24: qty 2

## 2016-11-24 MED ORDER — FAMOTIDINE 20 MG PO TABS
20.0000 mg | ORAL_TABLET | Freq: Once | ORAL | Status: AC | PRN
  Administered 2016-11-24: 20 mg via ORAL
  Filled 2016-11-24: qty 1

## 2016-11-24 MED ORDER — PHENYLEPHRINE HCL 10 MG/ML IJ SOLN
INTRAMUSCULAR | Status: AC
  Filled 2016-11-24: qty 1

## 2016-11-24 MED ORDER — FENTANYL CITRATE (PF) 100 MCG/2ML IJ SOLN
25.0000 ug | INTRAMUSCULAR | Status: DC | PRN
  Administered 2016-11-24 (×2): 50 ug via INTRAVENOUS
  Filled 2016-11-24: qty 1

## 2016-11-24 MED ORDER — FENTANYL CITRATE (PF) 100 MCG/2ML IJ SOLN
INTRAMUSCULAR | Status: AC
  Filled 2016-11-24: qty 2

## 2016-11-24 MED ORDER — LIDOCAINE 2% (20 MG/ML) 5 ML SYRINGE
INTRAMUSCULAR | Status: AC
  Filled 2016-11-24: qty 5

## 2016-11-24 MED ORDER — SUGAMMADEX SODIUM 200 MG/2ML IV SOLN
INTRAVENOUS | Status: AC
  Filled 2016-11-24: qty 2

## 2016-11-24 MED ORDER — FAMOTIDINE 20 MG PO TABS
ORAL_TABLET | ORAL | Status: AC
  Filled 2016-11-24: qty 1

## 2016-11-24 MED ORDER — SENNOSIDES-DOCUSATE SODIUM 8.6-50 MG PO TABS
2.0000 | ORAL_TABLET | Freq: Every day | ORAL | Status: DC
  Administered 2016-11-24 – 2016-11-25 (×2): 2 via ORAL
  Filled 2016-11-24 (×2): qty 2

## 2016-11-24 MED ORDER — PROPOFOL 10 MG/ML IV BOLUS
INTRAVENOUS | Status: AC
  Filled 2016-11-24: qty 20

## 2016-11-24 MED ORDER — ZOLPIDEM TARTRATE 5 MG PO TABS: 5.0000 mg | ORAL_TABLET | Freq: Every evening | ORAL | Status: DC | PRN

## 2016-11-24 MED ORDER — SODIUM CHLORIDE 0.9 % IV SOLN
INTRAVENOUS | Status: DC | PRN
  Administered 2016-11-24: 15 ug/min via INTRAVENOUS

## 2016-11-24 MED ORDER — PHENYLEPHRINE 40 MCG/ML (10ML) SYRINGE FOR IV PUSH (FOR BLOOD PRESSURE SUPPORT)
PREFILLED_SYRINGE | INTRAVENOUS | Status: DC | PRN
  Administered 2016-11-24: 80 ug via INTRAVENOUS

## 2016-11-24 MED ORDER — FENTANYL CITRATE (PF) 100 MCG/2ML IJ SOLN
INTRAMUSCULAR | Status: DC | PRN
  Administered 2016-11-24: 50 ug via INTRAVENOUS
  Administered 2016-11-24 (×2): 25 ug via INTRAVENOUS

## 2016-11-24 MED ORDER — METOCLOPRAMIDE HCL 10 MG PO TABS
10.0000 mg | ORAL_TABLET | Freq: Once | ORAL | Status: DC | PRN
  Filled 2016-11-24: qty 1

## 2016-11-24 MED ORDER — SCOPOLAMINE 1 MG/3DAYS TD PT72
1.0000 | MEDICATED_PATCH | Freq: Once | TRANSDERMAL | Status: DC | PRN
  Administered 2016-11-24: 1.5 mg via TRANSDERMAL
  Filled 2016-11-24: qty 1

## 2016-11-24 MED ORDER — CEFAZOLIN SODIUM-DEXTROSE 2-4 GM/100ML-% IV SOLN
INTRAVENOUS | Status: AC
  Filled 2016-11-24: qty 100

## 2016-11-24 MED ORDER — ROCURONIUM BROMIDE 50 MG/5ML IV SOSY
PREFILLED_SYRINGE | INTRAVENOUS | Status: DC | PRN
  Administered 2016-11-24: 20 mg via INTRAVENOUS

## 2016-11-24 MED ORDER — LACTATED RINGERS IV SOLN
INTRAVENOUS | Status: DC
  Administered 2016-11-24: 08:00:00 via INTRAVENOUS
  Filled 2016-11-24: qty 1000

## 2016-11-24 MED ORDER — HYDROCHLOROTHIAZIDE 25 MG PO TABS
25.0000 mg | ORAL_TABLET | Freq: Every day | ORAL | Status: DC
  Administered 2016-11-24 – 2016-11-25 (×2): 25 mg via ORAL
  Filled 2016-11-24 (×2): qty 1

## 2016-11-24 MED ORDER — DEXAMETHASONE SODIUM PHOSPHATE 10 MG/ML IJ SOLN
INTRAMUSCULAR | Status: AC
  Filled 2016-11-24: qty 1

## 2016-11-24 MED ORDER — SUCCINYLCHOLINE CHLORIDE 200 MG/10ML IV SOSY
PREFILLED_SYRINGE | INTRAVENOUS | Status: AC
  Filled 2016-11-24: qty 10

## 2016-11-24 MED ORDER — SODIUM CHLORIDE 0.45 % IV SOLN
INTRAVENOUS | Status: DC
  Administered 2016-11-24 – 2016-11-25 (×2): via INTRAVENOUS

## 2016-11-24 MED ORDER — CEFAZOLIN SODIUM-DEXTROSE 2-4 GM/100ML-% IV SOLN
2.0000 g | INTRAVENOUS | Status: AC
  Administered 2016-11-24: 2 g via INTRAVENOUS
  Filled 2016-11-24: qty 100

## 2016-11-24 MED ORDER — SCOPOLAMINE 1 MG/3DAYS TD PT72
MEDICATED_PATCH | TRANSDERMAL | Status: AC
  Filled 2016-11-24: qty 1

## 2016-11-24 MED ORDER — SULFAMETHOXAZOLE-TRIMETHOPRIM 800-160 MG PO TABS
1.0000 | ORAL_TABLET | Freq: Two times a day (BID) | ORAL | Status: DC
  Administered 2016-11-24 – 2016-11-25 (×3): 1 via ORAL
  Filled 2016-11-24 (×3): qty 1

## 2016-11-24 MED ORDER — BELLADONNA ALKALOIDS-OPIUM 16.2-60 MG RE SUPP: 1.0000 | Freq: Four times a day (QID) | RECTAL | Status: DC | PRN

## 2016-11-24 MED ORDER — DEXAMETHASONE SODIUM PHOSPHATE 4 MG/ML IJ SOLN
INTRAMUSCULAR | Status: DC | PRN
  Administered 2016-11-24: 10 mg via INTRAVENOUS

## 2016-11-24 MED ORDER — PROPOFOL 10 MG/ML IV BOLUS
INTRAVENOUS | Status: DC | PRN
  Administered 2016-11-24: 100 mg via INTRAVENOUS

## 2016-11-24 MED ORDER — SUGAMMADEX SODIUM 200 MG/2ML IV SOLN
INTRAVENOUS | Status: DC | PRN
  Administered 2016-11-24: 25 mg via INTRAVENOUS
  Administered 2016-11-24: 100 mg via INTRAVENOUS

## 2016-11-24 MED ORDER — LACTATED RINGERS IV SOLN
INTRAVENOUS | Status: DC
  Filled 2016-11-24: qty 1000

## 2016-11-24 MED ORDER — METOPROLOL SUCCINATE ER 25 MG PO TB24
75.0000 mg | ORAL_TABLET | Freq: Every day | ORAL | Status: DC
  Administered 2016-11-25: 75 mg via ORAL
  Filled 2016-11-24 (×2): qty 3

## 2016-11-24 SURGICAL SUPPLY — 32 items
BAG DRAIN URO-CYSTO SKYTR STRL (DRAIN) ×3 IMPLANT
BAG URINE DRAINAGE (UROLOGICAL SUPPLIES) ×3 IMPLANT
BAG URINE LEG 19OZ MD ST LTX (BAG) IMPLANT
CATH FOLEY 2WAY SLVR  5CC 20FR (CATHETERS)
CATH FOLEY 2WAY SLVR  5CC 22FR (CATHETERS)
CATH FOLEY 2WAY SLVR 30CC 22FR (CATHETERS) IMPLANT
CATH FOLEY 2WAY SLVR 5CC 20FR (CATHETERS) IMPLANT
CATH FOLEY 2WAY SLVR 5CC 22FR (CATHETERS) IMPLANT
CATH FOLEY 3WAY 30CC 22F (CATHETERS) IMPLANT
CATH HEMA 3WAY 30CC 24FR COUDE (CATHETERS) IMPLANT
CATH HEMA 3WAY 30CC 24FR RND (CATHETERS) ×3 IMPLANT
CLOTH BEACON ORANGE TIMEOUT ST (SAFETY) ×3 IMPLANT
ELECT BUTTON BIOP 24F 90D PLAS (MISCELLANEOUS) IMPLANT
ELECT REM PT RETURN 9FT ADLT (ELECTROSURGICAL) ×3
ELECTRODE REM PT RTRN 9FT ADLT (ELECTROSURGICAL) ×1 IMPLANT
EVACUATOR MICROVAS BLADDER (UROLOGICAL SUPPLIES) IMPLANT
GLOVE BIO SURGEON STRL SZ8 (GLOVE) ×3 IMPLANT
GOWN STRL REUS W/ TWL LRG LVL3 (GOWN DISPOSABLE) ×1 IMPLANT
GOWN STRL REUS W/ TWL XL LVL3 (GOWN DISPOSABLE) ×1 IMPLANT
GOWN STRL REUS W/TWL LRG LVL3 (GOWN DISPOSABLE) ×2
GOWN STRL REUS W/TWL XL LVL3 (GOWN DISPOSABLE) ×2
HOLDER FOLEY CATH W/STRAP (MISCELLANEOUS) IMPLANT
IV NS IRRIG 3000ML ARTHROMATIC (IV SOLUTION) ×27 IMPLANT
KIT RM TURNOVER CYSTO AR (KITS) ×3 IMPLANT
MANIFOLD NEPTUNE II (INSTRUMENTS) IMPLANT
PACK CYSTO (CUSTOM PROCEDURE TRAY) ×3 IMPLANT
PLUG CATH AND CAP STER (CATHETERS) IMPLANT
SET ASPIRATION TUBING (TUBING) IMPLANT
SYR 30ML LL (SYRINGE) IMPLANT
SYRINGE IRR TOOMEY STRL 70CC (SYRINGE) IMPLANT
TUBE CONNECTING 12'X1/4 (SUCTIONS)
TUBE CONNECTING 12X1/4 (SUCTIONS) IMPLANT

## 2016-11-24 NOTE — Transfer of Care (Signed)
Last Vitals:  Vitals:   11/24/16 0702 11/24/16 1035  BP: (!) 153/80 139/78  Pulse: 63 64  Resp: 17 18  Temp: 36.7 C (!) 36.3 C    Last Pain:  Vitals:   11/24/16 1035  TempSrc:   PainSc: (P) 0-No pain      Patients Stated Pain Goal: 5 (11/24/16 0725)  Immediate Anesthesia Transfer of Care Note  Patient: Ian Hill  Procedure(s) Performed: Procedure(s) (LRB): TRANSURETHRAL RESECTION OF THE PROSTATE (TURP) (N/A)  Patient Location: PACU  Anesthesia Type: General  Level of Consciousness: awake, alert  and oriented  Airway & Oxygen Therapy: Patient Spontanous Breathing and Patient connected to face mask oxygen  Post-op Assessment: Report given to PACU RN and Post -op Vital signs reviewed and stable  Post vital signs: Reviewed and stable  Complications: No apparent anesthesia complications

## 2016-11-24 NOTE — Interval H&P Note (Signed)
History and Physical Interval Note:  11/24/2016 8:38 AM  Ian Hill  has presented today for surgery, with the diagnosis of BENIGN PROSTATIC HYPERPLASIA WITH RETENTION  The various methods of treatment have been discussed with the patient and family. After consideration of risks, benefits and other options for treatment, the patient has consented to  Procedure(s): TRANSURETHRAL RESECTION OF THE PROSTATE (TURP) (N/A) as a surgical intervention .  The patient's history has been reviewed, patient examined, no change in status, stable for surgery.  I have reviewed the patient's chart and labs.  Questions were answered to the patient's satisfaction.     Jorja Loa

## 2016-11-24 NOTE — Anesthesia Procedure Notes (Signed)
Procedure Name: Intubation Date/Time: 11/24/2016 9:04 AM Performed by: Lyndle Herrlich Pre-anesthesia Checklist: Patient identified, Emergency Drugs available, Suction available and Patient being monitored Patient Re-evaluated:Patient Re-evaluated prior to induction Oxygen Delivery Method: Circle system utilized Preoxygenation: Pre-oxygenation with 100% oxygen Induction Type: IV induction Ventilation: Mask ventilation without difficulty Tube type: Oral Number of attempts: 1 Airway Equipment and Method: Stylet and Oral airway Placement Confirmation: ETT inserted through vocal cords under direct vision,  positive ETCO2 and breath sounds checked- equal and bilateral Secured at: 24 cm Tube secured with: Tape Dental Injury: Teeth and Oropharynx as per pre-operative assessment

## 2016-11-24 NOTE — Anesthesia Preprocedure Evaluation (Signed)
Anesthesia Evaluation  Patient identified by MRN, date of birth, ID band Patient awake    Reviewed: Allergy & Precautions, H&P , Patient's Chart, lab work & pertinent test results, reviewed documented beta blocker date and time   Airway Mallampati: II  TM Distance: >3 FB Neck ROM: full    Dental no notable dental hx.    Pulmonary Current Smoker,    Pulmonary exam normal breath sounds clear to auscultation       Cardiovascular hypertension,  Rhythm:regular Rate:Normal     Neuro/Psych    GI/Hepatic   Endo/Other    Renal/GU      Musculoskeletal   Abdominal   Peds  Hematology   Anesthesia Other Findings Echo 11-2016..... For Hx of Cardiomyopathy- Vigorous LV systolic function; apical hypertrophy; moderate   diastolic dysfunction with elevated LV filling pressure; mild AI;   mild MR; moderate biatrial enlargement; mild to moderate TR with   moderately elevated pulmonary pressure.  Reproductive/Obstetrics                             Anesthesia Physical Anesthesia Plan  ASA: II  Anesthesia Plan: General   Post-op Pain Management:    Induction: Intravenous  PONV Risk Score and Plan:   Airway Management Planned: LMA  Additional Equipment:   Intra-op Plan:   Post-operative Plan:   Informed Consent: I have reviewed the patients History and Physical, chart, labs and discussed the procedure including the risks, benefits and alternatives for the proposed anesthesia with the patient or authorized representative who has indicated his/her understanding and acceptance.   Dental Advisory Given  Plan Discussed with: CRNA and Surgeon  Anesthesia Plan Comments: ( )        Anesthesia Quick Evaluation

## 2016-11-24 NOTE — Discharge Instructions (Signed)
Transurethral Resection of the Prostate ° °Care After ° °Refer to this sheet in the next few weeks. These discharge instructions provide you with general information on caring for yourself after you leave the hospital. Your caregiver may also give you specific instructions. Your treatment has been planned according to the most current medical practices available, but unavoidable complications sometimes occur. If you have any problems or questions after discharge, please call your caregiver. ° °HOME CARE INSTRUCTIONS  ° °Medications °· You may receive medicine for pain management. As your level of discomfort decreases, adjustments in your pain medicines may be made.  °· Take all medicines as directed.  °· You may be given a medicine (antibiotic) to kill germs following surgery. Finish all medicines. Let your caregiver know if you have any side effects or problems from the medicine.  °· If you are on aspirin, it would be best not to restart the aspirin until the blood in the urine clears °Hygiene °· You can take a shower after surgery.  °· You should not take a bath while you still have the urethral catheter. °Activity °· You will be encouraged to get out of bed as much as possible and increase your activity level as tolerated.  °· Spend the first week in and around your home. For 3 weeks, avoid the following:  °· Straining.  °· Running.  °· Strenuous work.  °· Walks longer than a few blocks.  °· Riding for extended periods.  °· Sexual relations.  °· Do not lift heavy objects (more than 20 pounds) for at least 1 month. When lifting, use your arms instead of your abdominal muscles.  °· You will be encouraged to walk as tolerated. Do not exert yourself. Increase your activity level slowly. Remember that it is important to keep moving after an operation of any type. This cuts down on the possibility of developing blood clots.  °· Your caregiver will tell you when you can resume driving and light housework. Discuss this  at your first office visit after discharge. °Diet °· No special diet is ordered after a TURP. However, if you are on a special diet for another medical problem, it should be continued.  °· Normal fluid intake is usually recommended.  °· Avoid alcohol and caffeinated drinks for 2 weeks. They irritate the bladder. Decaffeinated drinks are okay.  °· Avoid spicy foods.  °Bladder Function °· For the first 10 days, empty the bladder whenever you feel a definite desire. Do not try to hold the urine for long periods of time.  °· Urinating once or twice a night even after you are healed is not uncommon.  °· You may see some recurrence of blood in the urine after discharge from the hospital. This usually happens within 2 weeks after the procedure.If this occurs, force fluids again as you did in the hospital and reduce your activity.  °Bowel Function °· You may experience some constipation after surgery. This can be minimized by increasing fluids and fiber in your diet. Drink enough water and fluids to keep your urine clear or pale yellow.  °· A stool softener may be prescribed for use at home. Do not strain to move your bowels.  °· If you are requiring increased pain medicine, it is important that you take stool softeners to prevent constipation. This will help to promote proper healing by reducing the need to strain to move your bowels.  °Sexual Activity °· Semen movement in the opposite direction and into the bladder (  retrograde ejaculation) may occur. Since the semen passes into the bladder, cloudy urine can occur the first time you urinate after intercourse. Or, you may not have an ejaculation during erection. Ask your caregiver when you can resume sexual activity. Retrograde ejaculation and reduced semen discharge should not reduce one's pleasure of intercourse.  °Postoperative Visit °· Arrange the date and time of your after surgery visit with your caregiver.  °Return to Work °· After your recovery is complete, you will  be able to return to work and resume all activities. Your caregiver will inform you when you can return to work.  °Foley Catheter Care °A soft, flexible tube (Foley catheter) may have been placed in your bladder to drain urine and fluid. Follow these instructions: °Taking Care of the Catheter °· Keep the area where the catheter leaves your body clean.  °· Attach the catheter to the leg so there is no tension on the catheter.  °· Keep the drainage bag below the level of the bladder, but keep it OFF the floor.  °· Do not take long soaking baths. Your caregiver will give instructions about showering.  °· Wash your hands before touching ANYTHING related to the catheter or bag.  °· Using mild soap and warm water on a washcloth:  °· Clean the area closest to the catheter insertion site using a circular motion around the catheter.  °· Clean the catheter itself by wiping AWAY from the insertion site for several inches down the tube.  °· NEVER wipe upward as this could sweep bacteria up into the urethra (tube in your body that normally drains the bladder) and cause infection.  °· Place a small amount of sterile lubricant at the tip of the penis where the catheter is entering.  °Taking Care of the Drainage Bags °· Two drainage bags may be taken home: a large overnight drainage bag, and a smaller leg bag which fits underneath clothing.  °· It is okay to wear the overnight bag at any time, but NEVER wear the smaller leg bag at night.  °· Keep the drainage bag well below the level of your bladder. This prevents backflow of urine into the bladder and allows the urine to drain freely.  °· Anchor the tubing to your leg to prevent pulling or tension on the catheter. Use tape or a leg strap provided by the hospital.  °· Empty the drainage bag when it is 1/2 to 3/4 full. Wash your hands before and after touching the bag.  °· Periodically check the tubing for kinks to make sure there is no pressure on the tubing which could restrict  the flow of urine.  °Changing the Drainage Bags °· Cleanse both ends of the clean bag with alcohol before changing.  °· Pinch off the rubber catheter to avoid urine spillage during the disconnection.  °· Disconnect the dirty bag and connect the clean one.  °· Empty the dirty bag carefully to avoid a urine spill.  °· Attach the new bag to the leg with tape or a leg strap.  °Cleaning the Drainage Bags °· Whenever a drainage bag is disconnected, it must be cleaned quickly so it is ready for the next use.  °· Wash the bag in warm, soapy water.  °· Rinse the bag thoroughly with warm water.  °· Soak the bag for 30 minutes in a solution of white vinegar and water (1 cup vinegar to 1 quart warm water).  °· Rinse with warm water.  °SEEK MEDICAL   CARE IF:  °· You have chills or night sweats.  °· You are leaking around your catheter or have problems with your catheter. It is not uncommon to have sporadic leakage around your catheter as a result of bladder spasms. If the leakage stops, there is not much need for concern. If you are uncertain, call your caregiver.  °· You develop side effects that you think are coming from your medicines.  °SEEK IMMEDIATE MEDICAL CARE IF:  °· You are suddenly unable to urinate. Check to see if there are any kinks in the drainage tubing that may cause this. If you cannot find any kinks, call your caregiver immediately. This is an emergency.  °· You develop shortness of breath or chest pains.  °· Bleeding persists or clots develop in your urine.  °· You have a fever.  °· You develop pain in your back or over your lower belly (abdomen).  °· You develop pain or swelling in your legs.  °· Any problems you are having get worse rather than better.  °MAKE SURE YOU:  °· Understand these instructions.  °· Will watch your condition.  °· Will get help right away if you are not doing well or get worse.  °Document Released: 04/28/2005 Document Revised: 01/08/2011 Document Reviewed: 12/20/2008 °[employer]®  Patient Information ©2012 [employer].Transurethral Resection of the Prostate °Care After °Refer to this sheet in the next few weeks. These discharge instructions provide you with general information on caring for yourself after you leave the hospital. Your caregiver may also give you specific instructions. Your treatment has been planned according to the most current medical practices available, but unavoidable complications sometimes occur. If you have any problems or questions after discharge, please call your caregiver. °

## 2016-11-24 NOTE — H&P (Signed)
H&P  Chief Complaint: Difficulty urinating  History of Present Illness: 66 year old male with urinary retention secondary to obstructive BPH presents for TURP. He has an indwelling catheter placed since his presentation with ARF and bilateral hydronephrosis which has resolved since catheter mgmt.The procedure has been discussed with the pt as well as his sister (his POA). They understand that this will involve an anesthetic as well as an overnight stay.  Past Medical History:  Diagnosis Date  . BPH (benign prostatic hyperplasia)   . CKD (chronic kidney disease), stage II   . Diverticulosis   . Dyslipidemia   . Elevated PSA   . Hypertension   . Hypertrophic obstructive cardiomyopathy with diastolic heart failure ([place])   . Mitral regurgitation   . Urine retention     Past Surgical History:  Procedure Laterality Date  . 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 CARDIOVASCULAR STRESS TEST  11-19-2016  dr Stanford Breed   Low risk nuclear study/  normal resting and stress perfusion with no ischemia or infarction/  EF not done due to PVCIs , TID borderline, 1.2 significant LVH  . TRANSTHORACIC ECHOCARDIOGRAM  11-20-2016   dr Stanford Breed   mild LVH,  apical hypertrophic cardiomyopathy, systolic function vigorous,  ef 65-70%,  grade 2 diastolic dysfunction/  mild AR, MR, and  PR/  moderate LAE and RAE/  mild to mod. TR/  moderate increase PASP 58mmHg    Home Medications:    Allergies: No Known Allergies  History reviewed. No pertinent family history.  Social History:  reports that he has been smoking Cigarettes.  He has been smoking about 0.20 packs per day. He has never used smokeless tobacco. He reports that he does not drink alcohol or use drugs.  ROS: A complete review of systems was performed.  All systems are negative except for pertinent findings as noted.  Physical Exam:  Vital signs in last 24 hours: Temp:  [98 F (36.7 C)] 98 F (36.7 C) (07/16 0702) Pulse Rate:   [63] 63 (07/16 0702) Resp:  [17] 17 (07/16 0702) BP: (153)/(80) 153/80 (07/16 0702) SpO2:  [95 %] 95 % (07/16 0702) Weight:  [52.6 kg (116 lb)] 52.6 kg (116 lb) (07/16 0702) Constitutional:  Alert and oriented, No acute distress Cardiovascular: Regular rate and rhythm, No JVD Respiratory: Normal respiratory effort, Lungs clear bilaterally GI: Abdomen is soft, nontender, nondistended, no abdominal masses Genitourinary: No CVAT. Normal male phallus, testes are descended bilaterally and non-tender and without masses, scrotum is normal in appearance without lesions or masses, perineum is normal on inspection. Rectal: Normal sphincter tone, no rectal masses, prostate is non tender and without nodularity. c Lymphatic: No lymphadenopathy Neurologic: Grossly intact, no focal deficits Psychiatric: Normal mood and affect  Laboratory Data:  No results for input(s): WBC, HGB, HCT, PLT in the last 72 hours.  No results for input(s): NA, K, CL, GLUCOSE, BUN, CALCIUM, CREATININE in the last 72 hours.  Invalid input(s): CO3   No results found for this or any previous visit (from the past 24 hour(s)). No results found for this or any previous visit (from the past 240 hour(s)).  Renal Function: No results for input(s): CREATININE in the last 168 hours. CrCl cannot be calculated (Patient's most recent lab result is older than the maximum 21 days allowed.).  Radiologic Imaging: No results found.  Impression/Assessment:  BPH with retention  Plan:  TURP

## 2016-11-24 NOTE — Anesthesia Postprocedure Evaluation (Addendum)
Anesthesia Post Note  Patient: Ian Hill  Procedure(s) Performed: Procedure(s) (LRB): TRANSURETHRAL RESECTION OF THE PROSTATE (TURP) (N/A)     Patient location during evaluation: PACU Anesthesia Type: General Level of consciousness: awake and alert Pain management: pain level controlled Vital Signs Assessment: post-procedure vital signs reviewed and stable Respiratory status: spontaneous breathing, nonlabored ventilation, respiratory function stable and patient connected to nasal cannula oxygen Cardiovascular status: blood pressure returned to baseline and stable Postop Assessment: no signs of nausea or vomiting Anesthetic complications: no    Last Vitals:  Vitals:   11/24/16 1115 11/24/16 1130  BP: 129/85 134/76  Pulse: 60 61  Resp: 16 17  Temp:  (!) 36.3 C    Last Pain:  Vitals:   11/24/16 1035  TempSrc:   PainSc: 0-No pain                 Cardelia Sassano EDWARD

## 2016-11-24 NOTE — Op Note (Signed)
Preoperative diagnosis: 1. Bladder outlet obstruction secondary to BPH  Postoperative diagnosis:  1. Bladder outlet obstruction secondary to BPH  Procedure:  1. Cystoscopy 2. Transurethral resection of the prostate  Surgeon: Lillette Boxer. Breyton Vanscyoc, M.D.  Anesthesia: General  Complications: None  Drain: Foley catheter--24 Fr hematuria 3 way  EBL: Minimal  Specimens: 1. Prostate chips  Disposition of specimens: Pathology  Indication: Ian Hill is a patient with bladder outlet obstruction secondary to benign prostatic hyperplasia. After reviewing the management options for treatment, he elected to proceed with the above surgical procedure(s). We have discussed the potential benefits and risks of the procedure, side effects of the proposed treatment, the likelihood of the patient achieving the goals of the procedure, and any potential problems that might occur during the procedure or recuperation. Informed consent has been obtained.  Description of procedure:  The patient was identified in the holding area.He received preoperative antibiotics. He was then taken to the operating room. General anesthetic was administered.  The patient was then placed in the dorsal lithotomy position, prepped and draped in the usual sterile fashion. Timeout was then performed.  A resectoscope sheath was placed using the obturator, and the resectoscope, loop and telescope were placed.  The bladder was then systematically examined in its entirety. There was no evidence of  tumors, stones, or other mucosal pathology.  The ureteral orifices were identified and marked so as to be avoided during the procedure.  The prostate adenoma was then resected utilizing loop cautery resection with the bipolar cutting loop.  The prostate adenoma from the bladder neck back to the verumontanum was resected beginning at the six o'clock position and then extended to include the right and left lobes of the prostate and  anterior prostate, respectively. Care was taken not to resect distal to the verumontanum.  Hemostasis was then achieved with the cautery and the bladder was emptied and reinspected with no significant bleeding noted at the end of the procedure.  Resected chips were irrigated from the bladder with the evacuator and sent to pathology.  A 3 way catheter was then placed into the bladder and placed on continuous bladder irrigation.  The patient appeared to tolerate the procedure well and without complications. The patient was able to be awakened and transferred to the recovery unit in satisfactory condition. He tolerated the procedure well.

## 2016-11-25 ENCOUNTER — Encounter (HOSPITAL_BASED_OUTPATIENT_CLINIC_OR_DEPARTMENT_OTHER): Payer: Self-pay | Admitting: Urology

## 2016-11-25 DIAGNOSIS — N401 Enlarged prostate with lower urinary tract symptoms: Secondary | ICD-10-CM | POA: Diagnosis not present

## 2016-11-25 DIAGNOSIS — I421 Obstructive hypertrophic cardiomyopathy: Secondary | ICD-10-CM | POA: Diagnosis not present

## 2016-11-25 DIAGNOSIS — I130 Hypertensive heart and chronic kidney disease with heart failure and stage 1 through stage 4 chronic kidney disease, or unspecified chronic kidney disease: Secondary | ICD-10-CM | POA: Diagnosis not present

## 2016-11-25 DIAGNOSIS — C61 Malignant neoplasm of prostate: Secondary | ICD-10-CM | POA: Diagnosis not present

## 2016-11-25 DIAGNOSIS — N182 Chronic kidney disease, stage 2 (mild): Secondary | ICD-10-CM | POA: Diagnosis not present

## 2016-11-25 DIAGNOSIS — R338 Other retention of urine: Secondary | ICD-10-CM | POA: Diagnosis not present

## 2016-11-25 DIAGNOSIS — I5030 Unspecified diastolic (congestive) heart failure: Secondary | ICD-10-CM | POA: Diagnosis not present

## 2016-11-25 DIAGNOSIS — E785 Hyperlipidemia, unspecified: Secondary | ICD-10-CM | POA: Diagnosis not present

## 2016-11-25 DIAGNOSIS — Z79899 Other long term (current) drug therapy: Secondary | ICD-10-CM | POA: Diagnosis not present

## 2016-11-25 MED ORDER — SULFAMETHOXAZOLE-TRIMETHOPRIM 800-160 MG PO TABS: 1.0000 | ORAL_TABLET | Freq: Two times a day (BID) | ORAL | 0 refills | Status: DC

## 2016-11-25 NOTE — Progress Notes (Signed)
Sister called and will arrive soon to take pt home, will review instructions with pt and sister. SRP, RN

## 2016-11-25 NOTE — Progress Notes (Signed)
Pt voided 225cc blood tinged urine, 599 cc bladder scanned for PVR., MD notified. SRP, RN

## 2016-11-25 NOTE — Progress Notes (Signed)
MD updated concerning urinary output, pt will d/c with 38F coude catheter in place. Pt has had catheter in the pass and is familiar with the care and management at home, pt states sister will assist as well, caregiver for the patient. Pt lives with sister. SRP, RN.

## 2016-11-25 NOTE — Progress Notes (Signed)
Pt voided sitting in bed, will get pt up standing for next voiding, voided 200 cc blood tinged urine, bladder scanned 435 cc for PVR, will recheck second time and called MD of results per order. SRP, RN

## 2016-11-26 ENCOUNTER — Encounter (HOSPITAL_COMMUNITY): Payer: Medicare Other

## 2016-11-27 ENCOUNTER — Other Ambulatory Visit (HOSPITAL_COMMUNITY): Payer: Medicare Other

## 2016-11-27 ENCOUNTER — Other Ambulatory Visit: Payer: Self-pay | Admitting: *Deleted

## 2016-11-28 NOTE — Telephone Encounter (Signed)
Patient had TURP done on 7/16. Will defer to Urology for further management of his BPH.

## 2016-12-03 DIAGNOSIS — R338 Other retention of urine: Secondary | ICD-10-CM | POA: Diagnosis not present

## 2016-12-05 DIAGNOSIS — R3914 Feeling of incomplete bladder emptying: Secondary | ICD-10-CM | POA: Diagnosis not present

## 2016-12-05 DIAGNOSIS — N138 Other obstructive and reflux uropathy: Secondary | ICD-10-CM | POA: Diagnosis not present

## 2016-12-07 NOTE — Discharge Summary (Signed)
Patient ID: Ian Hill MRN: [id number] DOB/AGE: 68/68/1948 68 y.o.  Admit date: 11/24/2016 Discharge date: 12/07/2016  Primary Care Physician:  Ian Pile, MD  Discharge Diagnoses:  Adenocarcinoma of the prostate BPH Present on Admission: . Enlarged prostate with urinary retention   Consults: None   Discharge Medications: Allergies as of 11/25/2016   No Known Allergies     Medication List    STOP taking these medications   finasteride 5 MG tablet Commonly known as:  PROSCAR     TAKE these medications   feeding supplement (ENSURE ENLIVE) Liqd Take 237 mLs by mouth 2 (two) times daily between meals.   hydrochlorothiazide 25 MG tablet Commonly known as:  HYDRODIURIL TAKE 1 TABLET BY MOUTH EVERY DAY   metoprolol succinate 25 MG 24 hr tablet Commonly known as:  TOPROL XL Take 3 tablets (75 mg total) by mouth daily. Take with or immediately following a meal.   polyethylene glycol packet Commonly known as:  MIRALAX / GLYCOLAX Take 17 g by mouth daily as needed for mild constipation.   senna-docusate 8.6-50 MG tablet Commonly known as:  Senokot-S Take 2 tablets by mouth daily.   simvastatin 40 MG tablet Commonly known as:  ZOCOR Take 1 tablet (40 mg total) by mouth at bedtime. What changed:  when to take this   sulfamethoxazole-trimethoprim 800-160 MG tablet Commonly known as:  BACTRIM DS,SEPTRA DS Take 1 tablet by mouth every 12 (twelve) hours.   tamsulosin 0.4 MG Caps capsule Commonly known as:  FLOMAX TAKE 1 CAPSULE BY MOUTH EVERY DAY        Significant Diagnostic Studies:  No results found.  Brief H and P: For complete details please refer to admission H and P, but in brief  The patient is admitted for BPH management/TURP.  He has urinary retention. Hospital Course:  The patient was taken to the operating room on the day of admission for TURP.  This was performed without difficulty.  The patient had an uncomplicated hospital course.  He  did not void adequately after catheter was removed on postoperative day #1.  The catheter was replaced, and he was discharged at this point.  He will follow-up in the office for voiding trial.    Day of Discharge BP (!) 130/58 (BP Location: Left Arm)   Pulse 69   Temp (!) 97.3 F (36.3 C) (Oral)   Resp 18   Ht 6' (1.829 m)   Wt 52.6 kg (116 lb)   SpO2 100%   BMI 15.73 kg/m   No results found for this or any previous visit (from the past 24 hour(s)).  Physical Exam: General: Alert and awake oriented x3 not in any acute distress. HEENT: anicteric sclera, pupils reactive to light and accommodation CVS: S1-S2 clear no murmur rubs or gallops Chest: clear to auscultation bilaterally, no wheezing rales or rhonchi Abdomen: soft nontender, nondistended, normal bowel sounds, no organomegaly Extremities: no cyanosis, clubbing or edema noted bilaterally Neuro: Cranial nerves II-XII intact, no focal neurological deficits  Disposition:  Home  Diet:  No restrictions  Activity:  Discussed with patient   Disposition and Follow-up:    Follow-up in one week for voiding trial  TESTS THAT NEED FOLLOW-UP  Pathology review  DISCHARGE FOLLOW-UP Follow-up Information    Ian Ralph, NP Follow up.   Specialty:  Urology Why:  7/24 @ 10:45 AM Contact information: [address] [place] [address] [phone]           Time  spent on Discharge:  15 minutes  Signed: Jorja Hill 12/07/2016, 1:54 AM

## 2016-12-14 ENCOUNTER — Encounter (HOSPITAL_COMMUNITY): Payer: Self-pay | Admitting: *Deleted

## 2016-12-14 ENCOUNTER — Emergency Department (HOSPITAL_COMMUNITY)
Admission: EM | Admit: 2016-12-14 | Discharge: 2016-12-14 | Disposition: A | Discharge status: Home / Self Care | Payer: Medicare Other | Attending: Emergency Medicine | Admitting: Emergency Medicine

## 2016-12-14 DIAGNOSIS — R310 Gross hematuria: Secondary | ICD-10-CM | POA: Diagnosis not present

## 2016-12-14 DIAGNOSIS — I130 Hypertensive heart and chronic kidney disease with heart failure and stage 1 through stage 4 chronic kidney disease, or unspecified chronic kidney disease: Secondary | ICD-10-CM | POA: Insufficient documentation

## 2016-12-14 DIAGNOSIS — F17210 Nicotine dependence, cigarettes, uncomplicated: Secondary | ICD-10-CM | POA: Diagnosis not present

## 2016-12-14 DIAGNOSIS — R319 Hematuria, unspecified: Secondary | ICD-10-CM | POA: Diagnosis present

## 2016-12-14 DIAGNOSIS — I5030 Unspecified diastolic (congestive) heart failure: Secondary | ICD-10-CM | POA: Diagnosis not present

## 2016-12-14 DIAGNOSIS — Z79899 Other long term (current) drug therapy: Secondary | ICD-10-CM | POA: Insufficient documentation

## 2016-12-14 DIAGNOSIS — N182 Chronic kidney disease, stage 2 (mild): Secondary | ICD-10-CM | POA: Diagnosis not present

## 2016-12-14 LAB — CBC
HCT: 37.8 % — ABNORMAL LOW (ref 39.0–52.0)
HEMOGLOBIN: 12.2 g/dL — AB (ref 13.0–17.0)
MCH: 28.6 pg (ref 26.0–34.0)
MCHC: 32.3 g/dL (ref 30.0–36.0)
MCV: 88.7 fL (ref 78.0–100.0)
PLATELETS: 180 10*3/uL (ref 150–400)
RBC: 4.26 MIL/uL (ref 4.22–5.81)
RDW: 14.9 % (ref 11.5–15.5)
WBC: 6 10*3/uL (ref 4.0–10.5)

## 2016-12-14 LAB — COMPREHENSIVE METABOLIC PANEL
ALBUMIN: 4 g/dL (ref 3.5–5.0)
ALK PHOS: 115 U/L (ref 38–126)
ALT: 16 U/L — ABNORMAL LOW (ref 17–63)
ANION GAP: 13 (ref 5–15)
AST: 29 U/L (ref 15–41)
BILIRUBIN TOTAL: 0.8 mg/dL (ref 0.3–1.2)
BUN: 20 mg/dL (ref 6–20)
CALCIUM: 9.2 mg/dL (ref 8.9–10.3)
CO2: 23 mmol/L (ref 22–32)
Chloride: 98 mmol/L — ABNORMAL LOW (ref 101–111)
Creatinine, Ser: 1.43 mg/dL — ABNORMAL HIGH (ref 0.61–1.24)
GFR calc Af Amer: 55 mL/min — ABNORMAL LOW (ref >60)
GFR calc non Af Amer: 48 mL/min — ABNORMAL LOW (ref >60)
GLUCOSE: 124 mg/dL — AB (ref 65–99)
Potassium: 3.4 mmol/L — ABNORMAL LOW (ref 3.5–5.1)
Sodium: 134 mmol/L — ABNORMAL LOW (ref 135–145)
TOTAL PROTEIN: 7.7 g/dL (ref 6.5–8.1)

## 2016-12-14 LAB — URINALYSIS, MICROSCOPIC (REFLEX)

## 2016-12-14 LAB — URINALYSIS, ROUTINE W REFLEX MICROSCOPIC

## 2016-12-14 MED ORDER — CEPHALEXIN 250 MG PO CAPS
500.0000 mg | ORAL_CAPSULE | Freq: Once | ORAL | Status: AC
  Administered 2016-12-14: 500 mg via ORAL
  Filled 2016-12-14: qty 2

## 2016-12-14 MED ORDER — CEPHALEXIN 500 MG PO CAPS: 500.0000 mg | ORAL_CAPSULE | Freq: Two times a day (BID) | ORAL | 0 refills | Status: DC

## 2016-12-14 NOTE — ED Notes (Signed)
Will collect urine in back,  Pt has catheter.

## 2016-12-14 NOTE — ED Triage Notes (Signed)
Foley catheter irrigated with 15ml NS .  Clots to numerous to count from irrigation of foley  Foley out put bright red in color

## 2016-12-14 NOTE — ED Triage Notes (Signed)
The pt  Is c/o bloody urine since yesterday no pain

## 2016-12-14 NOTE — ED Triage Notes (Signed)
Large foley bag changed to Leg bag at request of Pt. And Family.

## 2016-12-14 NOTE — ED Provider Notes (Signed)
[facility] DEPT Provider Note   CSN: [id number] Arrival date & time: 12/14/16  0543     History   Chief Complaint Chief Complaint  Patient presents with  . Hematuria    HPI Ian Hill is a 66 y.o. male.  Patient is a 66 year old male with a history of BPH, hypertension, mitral regurg, HOCM, and CKD who presents with hematuria. He had a TURP done by [facility] on July 16. He states he been doing well until this morning when he noted some blood in his catheter. He states that now his catheter is not draining and is having bloody drainage around the catheter at the tip of his penis. He denies abdominal pain. No nausea or vomiting. No fevers.      Past Medical History:  Diagnosis Date  . BPH (benign prostatic hyperplasia)   . CKD (chronic kidney disease), stage II   . Diverticulosis   . Dyslipidemia   . Elevated PSA   . Hypertension   . Hypertrophic obstructive cardiomyopathy with diastolic heart failure ([place])   . Mitral regurgitation   . Urine retention     Patient Active Problem List   Diagnosis Date Noted  . Enlarged prostate with urinary retention 11/24/2016  . Lung nodule 09/16/2016  . Volume overload 09/10/2016  . Urinary retention 09/10/2016  . BPH (benign prostatic hyperplasia) 09/01/2016  . Periumbilical abdominal pain 09/01/2016  . Cerumen impaction 02/16/2016  . Decreased dorsalis pedis pulse 02/16/2016  . CKD (chronic kidney disease), stage II 02/20/2012  . Preventative health care 06/26/2010  . Other symptoms involving cardiovascular system 11/19/2009  . Hypertrophic obstructive cardiomyopathy ([place]) 10/10/2009  . Hyperlipidemia 04/30/2006  . TOBACCO ABUSE 04/30/2006  . Essential hypertension 04/30/2006  . ELEVATED PROSTATE SPECIFIC ANTIGEN 04/30/2006  . DIVERTICULOSIS, COLON 12/18/2005    Past Surgical History:  Procedure Laterality Date  . 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 CARDIOVASCULAR STRESS TEST   11-19-2016  dr Stanford Breed   Low risk nuclear study/  normal resting and stress perfusion with no ischemia or infarction/  EF not done due to PVCIs , TID borderline, 1.2 significant LVH  . TRANSTHORACIC ECHOCARDIOGRAM  11-20-2016   dr Stanford Breed   mild LVH,  apical hypertrophic cardiomyopathy, systolic function vigorous,  ef 65-70%,  grade 2 diastolic dysfunction/  mild AR, MR, and  PR/  moderate LAE and RAE/  mild to mod. TR/  moderate increase PASP 9mmHg  . TRANSURETHRAL RESECTION OF PROSTATE N/A 11/24/2016   Procedure: TRANSURETHRAL RESECTION OF THE PROSTATE (TURP);  Surgeon: Franchot Gallo, MD;  Location: [facility];  Service: Urology;  Laterality: N/A;       Home Medications    Prior to Admission medications   Medication Sig Start Date End Date Taking? Authorizing Provider  atenolol (TENORMIN) 25 MG tablet Take 25 mg by mouth every morning.   Yes [provider]  feeding supplement, ENSURE ENLIVE, (ENSURE ENLIVE) LIQD Take 237 mLs by mouth 2 (two) times daily between meals. Patient taking differently: Take 237 mLs by mouth daily.  09/11/16  Yes Lorella Nimrod, MD  finasteride (PROSCAR) 5 MG tablet Take 5 mg by mouth daily.   Yes [provider]  hydrochlorothiazide (HYDRODIURIL) 25 MG tablet TAKE 1 TABLET BY MOUTH EVERY DAY Patient taking differently: TAKE 25mg  BY MOUTH EVERY DAY 11/10/16  Yes [name], MD  simvastatin (ZOCOR) 40 MG tablet Take 1 tablet (40 mg total) by mouth at bedtime. Patient  taking differently: Take 40 mg by mouth every morning.  02/13/16  Yes [name], MD  tamsulosin (FLOMAX) 0.4 MG CAPS capsule TAKE 1 CAPSULE BY MOUTH EVERY DAY Patient taking differently: TAKE 0.4mg  BY MOUTH EVERY DAY 11/13/16  Yes [name], MD  cephALEXin (KEFLEX) 500 MG capsule Take 1 capsule (500 mg total) by mouth 2 (two) times daily. 12/14/16   Malvin Johns, MD  metoprolol succinate (TOPROL-XL) 25 MG 24 hr tablet Take 3 tablets (75 mg total) by  mouth daily. Take with or immediately following a meal. Patient not taking: Reported on 12/14/2016 11/18/16 11/18/17  Lelon Perla, MD  polyethylene glycol (MIRALAX / Floria Raveling) packet Take 17 g by mouth daily as needed for mild constipation. 09/11/16   Lorella Nimrod, MD  senna-docusate (SENOKOT-S) 8.6-50 MG tablet Take 2 tablets by mouth daily. Patient not taking: Reported on 12/14/2016 09/01/16   [name], MD    Family History No family history on file.  Social History Social History  Substance Use Topics  . Smoking status: Current Some Day Smoker    Packs/day: 0.20    Types: Cigarettes  . Smokeless tobacco: Never Used     Comment: two cigs a day  . Alcohol use No     Allergies   Patient has no known allergies.   Review of Systems Review of Systems  Constitutional: Negative for chills, diaphoresis, fatigue and fever.  HENT: Negative for congestion, rhinorrhea and sneezing.   Eyes: Negative.   Respiratory: Negative for cough, chest tightness and shortness of breath.   Cardiovascular: Negative for chest pain and leg swelling.  Gastrointestinal: Negative for abdominal pain, blood in stool, diarrhea, nausea and vomiting.  Genitourinary: Positive for dysuria and hematuria. Negative for difficulty urinating, flank pain and frequency.  Musculoskeletal: Negative for arthralgias and back pain.  Skin: Negative for rash.  Neurological: Negative for dizziness, speech difficulty, weakness, numbness and headaches.     Physical Exam Updated Vital Signs BP (!) 156/85 (BP Location: Right Arm)   Pulse 82   Temp 98.4 F (36.9 C) (Oral)   Resp 16   SpO2 100%   Physical Exam  Constitutional: [name] oriented to person, place, and time. He appears well-developed and well-nourished.  HENT:  Head: Normocephalic and atraumatic.  Eyes: Pupils are equal, round, and reactive to light.  Neck: Normal range of motion. Neck supple.  Cardiovascular: Normal rate, regular rhythm and normal  heart sounds.   Pulmonary/Chest: Effort normal and breath sounds normal. No respiratory distress. He has no wheezes. He has no rales. He exhibits no tenderness.  Abdominal: Soft. Bowel sounds are normal. There is no tenderness. There is no rebound and no guarding.  Genitourinary:  Genitourinary Comments: Patient has Foley catheter in place. There is bloody urine in the Foley bag. There is blood around the urethral meatus.  Musculoskeletal: Normal range of motion. He exhibits no edema.  Lymphadenopathy:    He has no cervical adenopathy.  Neurological: [name] alert and oriented to person, place, and time.  Skin: Skin is warm and dry. No rash noted.  Psychiatric: He has a normal mood and affect.     ED Treatments / Results  Labs (all labs ordered are listed, but only abnormal results are displayed) Labs Reviewed  COMPREHENSIVE METABOLIC PANEL - Abnormal; Notable for the following:       Result Value   Sodium 134 (*)    Potassium 3.4 (*)    Chloride 98 (*)    Glucose,  Bld 124 (*)    Creatinine, Ser 1.43 (*)    ALT 16 (*)    GFR calc non Af Amer 48 (*)    GFR calc Af Amer 55 (*)    All other components within normal limits  CBC - Abnormal; Notable for the following:    Hemoglobin 12.2 (*)    HCT 37.8 (*)    All other components within normal limits  URINALYSIS, ROUTINE W REFLEX MICROSCOPIC - Abnormal; Notable for the following:    Color, Urine RED (*)    APPearance TURBID (*)    Glucose, UA   (*)    Value: TEST NOT REPORTED DUE TO COLOR INTERFERENCE OF URINE PIGMENT   Hgb urine dipstick   (*)    Value: TEST NOT REPORTED DUE TO COLOR INTERFERENCE OF URINE PIGMENT   Bilirubin Urine   (*)    Value: TEST NOT REPORTED DUE TO COLOR INTERFERENCE OF URINE PIGMENT   Ketones, ur   (*)    Value: TEST NOT REPORTED DUE TO COLOR INTERFERENCE OF URINE PIGMENT   Protein, ur   (*)    Value: TEST NOT REPORTED DUE TO COLOR INTERFERENCE OF URINE PIGMENT   Nitrite   (*)    Value: TEST NOT  REPORTED DUE TO COLOR INTERFERENCE OF URINE PIGMENT   Leukocytes, UA   (*)    Value: TEST NOT REPORTED DUE TO COLOR INTERFERENCE OF URINE PIGMENT   All other components within normal limits  URINALYSIS, MICROSCOPIC (REFLEX) - Abnormal; Notable for the following:    Bacteria, UA RARE (*)    Squamous Epithelial / LPF 0-5 (*)    All other components within normal limits  URINE CULTURE    EKG  EKG Interpretation None       Radiology No results found.  Procedures Procedures (including critical care time)  Medications Ordered in ED Medications  cephALEXin (KEFLEX) capsule 500 mg (500 mg Oral Given 12/14/16 1013)     Initial Impression / Assessment and Plan / ED Course  I have reviewed the triage vital signs and the nursing notes.  Pertinent labs & imaging results that were available during my care of the patient were reviewed by me and considered in my medical decision making (see chart for details).     Patient presents with hematuria after recent TURP on July 16. We are unable to irrigate the clots successfully. His Foley catheter was therefore changed. It now is draining well. He still has grossly bloody urine but no clots. His urinalysis was not reported out given the gross hematuria. It was sent for culture. I will start him on Keflex. I spoke with Dr. Alinda Money with [facility] who advises to have the patient follow-up this week in the office. This information was relayed to the family. They were given precautions to return if he has any return of his suprapubic pain or if the Foley catheter doesn't appear to be draining well. They also were advised to return if he has any other worsening symptoms.  Final Clinical Impressions(s) / ED Diagnoses   Final diagnoses:  Gross hematuria    New Prescriptions New Prescriptions   CEPHALEXIN (KEFLEX) 500 MG CAPSULE    Take 1 capsule (500 mg total) by mouth 2 (two) times daily.     Malvin Johns, MD 12/14/16 1021

## 2016-12-16 LAB — URINE CULTURE

## 2016-12-17 ENCOUNTER — Telehealth: Payer: Self-pay | Admitting: Emergency Medicine

## 2016-12-17 NOTE — Progress Notes (Signed)
ED Antimicrobial Stewardship Positive Culture Follow Up   Ian Hill is an 66 y.o. male who presented to [facility] on 12/14/2016 with a chief complaint of  Chief Complaint  Patient presents with  . Hematuria    Recent Results (from the past 720 hour(s))  Urine culture     Status: Abnormal   Collection Time: 12/14/16  8:50 AM  Result Value Ref Range Status   Specimen Description URINE, CATHETERIZED  Final   Special Requests NONE  Final   Culture 80,000 COLONIES/mL ENTEROBACTER CLOACAE (A)  Final   Report Status 12/16/2016 FINAL  Final   Organism ID, Bacteria ENTEROBACTER CLOACAE (A)  Final      Susceptibility   Enterobacter cloacae - MIC*    CEFAZOLIN >=64 RESISTANT Resistant     CEFTRIAXONE <=1 SENSITIVE Sensitive     CIPROFLOXACIN <=0.25 SENSITIVE Sensitive     GENTAMICIN <=1 SENSITIVE Sensitive     IMIPENEM 0.5 SENSITIVE Sensitive     NITROFURANTOIN 64 INTERMEDIATE Intermediate     TRIMETH/SULFA <=20 SENSITIVE Sensitive     PIP/TAZO 8 SENSITIVE Sensitive     * 80,000 COLONIES/mL ENTEROBACTER CLOACAE   Pt had foley catheter that was clogged, that was replaced in the ED. S/p TURP July 16. Denies urinary symptoms.   Plan: Call patient to see if he has an appointment with urology.  Stop cephalexin.   ED Provider: Quincy Carnes, PA-C  Nida Boatman, PharmD PGY1 Acute Care Pharmacy Resident Pager: [phone] 12/17/2016, 9:14 AM

## 2016-12-17 NOTE — Telephone Encounter (Signed)
Post ED Visit - Positive Culture Follow-up: Successful Patient Follow-Up  Culture assessed and recommendations reviewed by: []  Elenor Quinones, Pharm.D. []  Heide Guile, Pharm.D., BCPS AQ-ID []  Parks Neptune, Pharm.D., BCPS []  Alycia Rossetti, Pharm.D., BCPS []  [place], [place].D., BCPS, AAHIVP []  Legrand Como, Pharm.D., BCPS, AAHIVP []  Salome Arnt, PharmD, BCPS []  Dimitri Ped, PharmD, BCPS []  Vincenza Hews, PharmD, BCPS Nida Boatman PharmD  Positive urine culture  []  Patient discharged without antimicrobial prescription and treatment is now indicated [x]  Organism is resistant to prescribed ED discharge antimicrobial []  Patient with positive blood cultures  Changes discussed with ED provider: Quincy Carnes PA New antibiotic prescription stop cephalexin, patient to f/u with Dr. Alinda Money at [facility] on 12/18/16   Contacted patient, 12/17/16 1418   Ian Hill 12/17/2016, 2:16 PM

## 2017-01-07 ENCOUNTER — Ambulatory Visit: Payer: Medicare Other | Admitting: Podiatry

## 2017-01-07 ENCOUNTER — Encounter: Payer: Medicare Other | Admitting: Internal Medicine

## 2017-01-07 ENCOUNTER — Encounter: Payer: Self-pay | Admitting: Internal Medicine

## 2017-01-10 DIAGNOSIS — I639 Cerebral infarction, unspecified: Secondary | ICD-10-CM

## 2017-01-10 HISTORY — DX: Cerebral infarction, unspecified: I63.9

## 2017-01-13 DIAGNOSIS — S0501XA Injury of conjunctiva and corneal abrasion without foreign body, right eye, initial encounter: Secondary | ICD-10-CM | POA: Diagnosis not present

## 2017-01-14 DIAGNOSIS — C61 Malignant neoplasm of prostate: Secondary | ICD-10-CM | POA: Diagnosis not present

## 2017-01-15 ENCOUNTER — Other Ambulatory Visit: Payer: Self-pay | Admitting: Urology

## 2017-01-15 DIAGNOSIS — S0501XA Injury of conjunctiva and corneal abrasion without foreign body, right eye, initial encounter: Secondary | ICD-10-CM | POA: Diagnosis not present

## 2017-01-15 DIAGNOSIS — C61 Malignant neoplasm of prostate: Secondary | ICD-10-CM

## 2017-01-23 ENCOUNTER — Emergency Department (HOSPITAL_COMMUNITY): Payer: Medicare Other

## 2017-01-23 ENCOUNTER — Emergency Department (HOSPITAL_COMMUNITY): Payer: Medicare Other | Admitting: Certified Registered"

## 2017-01-23 ENCOUNTER — Inpatient Hospital Stay (HOSPITAL_COMMUNITY)
Admission: EM | Admit: 2017-01-23 | Discharge: 2017-01-28 | DRG: 023 | Disposition: A | Discharge status: Rehabilitation Facility | Payer: Medicare Other | Attending: Neurology | Admitting: Neurology

## 2017-01-23 ENCOUNTER — Encounter (HOSPITAL_COMMUNITY)
Admission: EM | Disposition: A | Discharge status: Rehabilitation Facility | Payer: Self-pay | Source: Home / Self Care | Attending: Neurology

## 2017-01-23 ENCOUNTER — Inpatient Hospital Stay (HOSPITAL_COMMUNITY): Payer: Medicare Other

## 2017-01-23 DIAGNOSIS — J9601 Acute respiratory failure with hypoxia: Secondary | ICD-10-CM | POA: Diagnosis not present

## 2017-01-23 DIAGNOSIS — D696 Thrombocytopenia, unspecified: Secondary | ICD-10-CM

## 2017-01-23 DIAGNOSIS — Z978 Presence of other specified devices: Secondary | ICD-10-CM

## 2017-01-23 DIAGNOSIS — R402142 Coma scale, eyes open, spontaneous, at arrival to emergency department: Secondary | ICD-10-CM | POA: Diagnosis not present

## 2017-01-23 DIAGNOSIS — R52 Pain, unspecified: Secondary | ICD-10-CM

## 2017-01-23 DIAGNOSIS — I69393 Ataxia following cerebral infarction: Secondary | ICD-10-CM | POA: Diagnosis not present

## 2017-01-23 DIAGNOSIS — I493 Ventricular premature depolarization: Secondary | ICD-10-CM | POA: Diagnosis not present

## 2017-01-23 DIAGNOSIS — R402252 Coma scale, best verbal response, oriented, at arrival to emergency department: Secondary | ICD-10-CM | POA: Diagnosis present

## 2017-01-23 DIAGNOSIS — N183 Chronic kidney disease, stage 3 unspecified: Secondary | ICD-10-CM

## 2017-01-23 DIAGNOSIS — I10 Essential (primary) hypertension: Secondary | ICD-10-CM | POA: Diagnosis not present

## 2017-01-23 DIAGNOSIS — E876 Hypokalemia: Secondary | ICD-10-CM | POA: Diagnosis present

## 2017-01-23 DIAGNOSIS — N400 Enlarged prostate without lower urinary tract symptoms: Secondary | ICD-10-CM

## 2017-01-23 DIAGNOSIS — I69398 Other sequelae of cerebral infarction: Secondary | ICD-10-CM | POA: Diagnosis not present

## 2017-01-23 DIAGNOSIS — R402362 Coma scale, best motor response, obeys commands, at arrival to emergency department: Secondary | ICD-10-CM | POA: Diagnosis not present

## 2017-01-23 DIAGNOSIS — J90 Pleural effusion, not elsewhere classified: Secondary | ICD-10-CM | POA: Diagnosis not present

## 2017-01-23 DIAGNOSIS — R1310 Dysphagia, unspecified: Secondary | ICD-10-CM | POA: Diagnosis not present

## 2017-01-23 DIAGNOSIS — I639 Cerebral infarction, unspecified: Secondary | ICD-10-CM

## 2017-01-23 DIAGNOSIS — E785 Hyperlipidemia, unspecified: Secondary | ICD-10-CM

## 2017-01-23 DIAGNOSIS — I63411 Cerebral infarction due to embolism of right middle cerebral artery: Secondary | ICD-10-CM | POA: Diagnosis not present

## 2017-01-23 DIAGNOSIS — I43 Cardiomyopathy in diseases classified elsewhere: Secondary | ICD-10-CM | POA: Diagnosis not present

## 2017-01-23 DIAGNOSIS — J9600 Acute respiratory failure, unspecified whether with hypoxia or hypercapnia: Secondary | ICD-10-CM | POA: Diagnosis present

## 2017-01-23 DIAGNOSIS — I69392 Facial weakness following cerebral infarction: Secondary | ICD-10-CM | POA: Diagnosis not present

## 2017-01-23 DIAGNOSIS — R9431 Abnormal electrocardiogram [ECG] [EKG]: Secondary | ICD-10-CM | POA: Diagnosis not present

## 2017-01-23 DIAGNOSIS — R29810 Facial weakness: Secondary | ICD-10-CM | POA: Diagnosis not present

## 2017-01-23 DIAGNOSIS — I351 Nonrheumatic aortic (valve) insufficiency: Secondary | ICD-10-CM | POA: Diagnosis not present

## 2017-01-23 DIAGNOSIS — I422 Other hypertrophic cardiomyopathy: Secondary | ICD-10-CM | POA: Diagnosis not present

## 2017-01-23 DIAGNOSIS — Z681 Body mass index (BMI) 19 or less, adult: Secondary | ICD-10-CM | POA: Diagnosis not present

## 2017-01-23 DIAGNOSIS — I472 Ventricular tachycardia: Secondary | ICD-10-CM | POA: Diagnosis not present

## 2017-01-23 DIAGNOSIS — R29818 Other symptoms and signs involving the nervous system: Secondary | ICD-10-CM | POA: Diagnosis not present

## 2017-01-23 DIAGNOSIS — I69354 Hemiplegia and hemiparesis following cerebral infarction affecting left non-dominant side: Secondary | ICD-10-CM | POA: Diagnosis not present

## 2017-01-23 DIAGNOSIS — I6523 Occlusion and stenosis of bilateral carotid arteries: Secondary | ICD-10-CM | POA: Diagnosis not present

## 2017-01-23 DIAGNOSIS — R414 Neurologic neglect syndrome: Secondary | ICD-10-CM | POA: Diagnosis present

## 2017-01-23 DIAGNOSIS — H518 Other specified disorders of binocular movement: Secondary | ICD-10-CM | POA: Diagnosis present

## 2017-01-23 DIAGNOSIS — R4781 Slurred speech: Secondary | ICD-10-CM | POA: Diagnosis not present

## 2017-01-23 DIAGNOSIS — I4892 Unspecified atrial flutter: Secondary | ICD-10-CM | POA: Diagnosis not present

## 2017-01-23 DIAGNOSIS — R531 Weakness: Secondary | ICD-10-CM | POA: Diagnosis not present

## 2017-01-23 DIAGNOSIS — R29719 NIHSS score 19: Secondary | ICD-10-CM | POA: Diagnosis not present

## 2017-01-23 DIAGNOSIS — I361 Nonrheumatic tricuspid (valve) insufficiency: Secondary | ICD-10-CM | POA: Diagnosis not present

## 2017-01-23 DIAGNOSIS — G9341 Metabolic encephalopathy: Secondary | ICD-10-CM | POA: Diagnosis not present

## 2017-01-23 DIAGNOSIS — R739 Hyperglycemia, unspecified: Secondary | ICD-10-CM | POA: Diagnosis not present

## 2017-01-23 DIAGNOSIS — I480 Paroxysmal atrial fibrillation: Secondary | ICD-10-CM

## 2017-01-23 DIAGNOSIS — I6789 Other cerebrovascular disease: Secondary | ICD-10-CM | POA: Diagnosis not present

## 2017-01-23 DIAGNOSIS — I1310 Hypertensive heart and chronic kidney disease without heart failure, with stage 1 through stage 4 chronic kidney disease, or unspecified chronic kidney disease: Secondary | ICD-10-CM | POA: Diagnosis present

## 2017-01-23 DIAGNOSIS — Z79899 Other long term (current) drug therapy: Secondary | ICD-10-CM | POA: Diagnosis not present

## 2017-01-23 DIAGNOSIS — E43 Unspecified severe protein-calorie malnutrition: Secondary | ICD-10-CM | POA: Insufficient documentation

## 2017-01-23 DIAGNOSIS — I2510 Atherosclerotic heart disease of native coronary artery without angina pectoris: Secondary | ICD-10-CM | POA: Diagnosis present

## 2017-01-23 DIAGNOSIS — R0781 Pleurodynia: Secondary | ICD-10-CM | POA: Diagnosis not present

## 2017-01-23 DIAGNOSIS — R269 Unspecified abnormalities of gait and mobility: Secondary | ICD-10-CM | POA: Diagnosis not present

## 2017-01-23 DIAGNOSIS — R748 Abnormal levels of other serum enzymes: Secondary | ICD-10-CM | POA: Diagnosis not present

## 2017-01-23 DIAGNOSIS — J9811 Atelectasis: Secondary | ICD-10-CM | POA: Diagnosis not present

## 2017-01-23 DIAGNOSIS — G8194 Hemiplegia, unspecified affecting left nondominant side: Secondary | ICD-10-CM | POA: Diagnosis present

## 2017-01-23 DIAGNOSIS — I63511 Cerebral infarction due to unspecified occlusion or stenosis of right middle cerebral artery: Secondary | ICD-10-CM

## 2017-01-23 DIAGNOSIS — Z431 Encounter for attention to gastrostomy: Secondary | ICD-10-CM | POA: Diagnosis not present

## 2017-01-23 DIAGNOSIS — Z23 Encounter for immunization: Secondary | ICD-10-CM | POA: Diagnosis not present

## 2017-01-23 DIAGNOSIS — Z4659 Encounter for fitting and adjustment of other gastrointestinal appliance and device: Secondary | ICD-10-CM

## 2017-01-23 DIAGNOSIS — I119 Hypertensive heart disease without heart failure: Secondary | ICD-10-CM | POA: Diagnosis not present

## 2017-01-23 DIAGNOSIS — R451 Restlessness and agitation: Secondary | ICD-10-CM | POA: Diagnosis not present

## 2017-01-23 DIAGNOSIS — I6601 Occlusion and stenosis of right middle cerebral artery: Secondary | ICD-10-CM | POA: Diagnosis not present

## 2017-01-23 DIAGNOSIS — I69322 Dysarthria following cerebral infarction: Secondary | ICD-10-CM | POA: Diagnosis not present

## 2017-01-23 DIAGNOSIS — Z4682 Encounter for fitting and adjustment of non-vascular catheter: Secondary | ICD-10-CM | POA: Diagnosis not present

## 2017-01-23 HISTORY — DX: Hypertensive heart disease without heart failure: I11.9

## 2017-01-23 HISTORY — PX: RADIOLOGY WITH ANESTHESIA: SHX6223

## 2017-01-23 HISTORY — DX: Abnormal electrocardiogram (ECG) (EKG): R94.31

## 2017-01-23 HISTORY — PX: IR PERCUTANEOUS ART THROMBECTOMY/INFUSION INTRACRANIAL INC DIAG ANGIO: IMG6087

## 2017-01-23 LAB — PROTIME-INR
INR: 1.04
Prothrombin Time: 13.5 seconds (ref 11.4–15.2)

## 2017-01-23 LAB — COMPREHENSIVE METABOLIC PANEL
ALK PHOS: 86 U/L (ref 38–126)
ALT: 42 U/L (ref 17–63)
AST: 48 U/L — ABNORMAL HIGH (ref 15–41)
Albumin: 3.9 g/dL (ref 3.5–5.0)
Anion gap: 9 (ref 5–15)
BUN: 22 mg/dL — ABNORMAL HIGH (ref 6–20)
CALCIUM: 9.4 mg/dL (ref 8.9–10.3)
CO2: 28 mmol/L (ref 22–32)
CREATININE: 1.65 mg/dL — AB (ref 0.61–1.24)
Chloride: 102 mmol/L (ref 101–111)
GFR, EST AFRICAN AMERICAN: 47 mL/min — AB (ref >60)
GFR, EST NON AFRICAN AMERICAN: 40 mL/min — AB (ref >60)
Glucose, Bld: 107 mg/dL — ABNORMAL HIGH (ref 65–99)
Potassium: 4.4 mmol/L (ref 3.5–5.1)
Sodium: 139 mmol/L (ref 135–145)
Total Bilirubin: 0.8 mg/dL (ref 0.3–1.2)
Total Protein: 6.8 g/dL (ref 6.5–8.1)

## 2017-01-23 LAB — DIFFERENTIAL
Basophils Absolute: 0 10*3/uL (ref 0.0–0.1)
Basophils Relative: 0 %
Eosinophils Absolute: 0.1 10*3/uL (ref 0.0–0.7)
Eosinophils Relative: 2 %
LYMPHS ABS: 3.3 10*3/uL (ref 0.7–4.0)
LYMPHS PCT: 58 %
MONO ABS: 0.5 10*3/uL (ref 0.1–1.0)
MONOS PCT: 9 %
NEUTROS ABS: 1.8 10*3/uL (ref 1.7–7.7)
Neutrophils Relative %: 31 %

## 2017-01-23 LAB — CBC
HEMATOCRIT: 37.9 % — AB (ref 39.0–52.0)
Hemoglobin: 12.2 g/dL — ABNORMAL LOW (ref 13.0–17.0)
MCH: 28.7 pg (ref 26.0–34.0)
MCHC: 32.2 g/dL (ref 30.0–36.0)
MCV: 89.2 fL (ref 78.0–100.0)
Platelets: 158 10*3/uL (ref 150–400)
RBC: 4.25 MIL/uL (ref 4.22–5.81)
RDW: 15.4 % (ref 11.5–15.5)
WBC: 5.7 10*3/uL (ref 4.0–10.5)

## 2017-01-23 LAB — I-STAT CHEM 8, ED
BUN: 28 mg/dL — ABNORMAL HIGH (ref 6–20)
CREATININE: 1.5 mg/dL — AB (ref 0.61–1.24)
Calcium, Ion: 1.09 mmol/L — ABNORMAL LOW (ref 1.15–1.40)
Chloride: 102 mmol/L (ref 101–111)
GLUCOSE: 106 mg/dL — AB (ref 65–99)
HCT: 41 % (ref 39.0–52.0)
Hemoglobin: 13.9 g/dL (ref 13.0–17.0)
POTASSIUM: 4.4 mmol/L (ref 3.5–5.1)
Sodium: 141 mmol/L (ref 135–145)
TCO2: 30 mmol/L (ref 22–32)

## 2017-01-23 LAB — APTT: aPTT: 35 seconds (ref 24–36)

## 2017-01-23 LAB — I-STAT TROPONIN, ED: TROPONIN I, POC: 0.07 ng/mL (ref 0.00–0.08)

## 2017-01-23 LAB — CBG MONITORING, ED: Glucose-Capillary: 102 mg/dL — ABNORMAL HIGH (ref 65–99)

## 2017-01-23 SURGERY — RADIOLOGY WITH ANESTHESIA
Anesthesia: General

## 2017-01-23 MED ORDER — LIDOCAINE HCL (CARDIAC) 20 MG/ML IV SOLN
INTRAVENOUS | Status: DC | PRN
  Administered 2017-01-23: 60 mg via INTRATRACHEAL

## 2017-01-23 MED ORDER — CEFAZOLIN SODIUM-DEXTROSE 2-4 GM/100ML-% IV SOLN
INTRAVENOUS | Status: AC
  Filled 2017-01-23: qty 100

## 2017-01-23 MED ORDER — SODIUM CHLORIDE 0.9 % IV SOLN
INTRAVENOUS | Status: DC
  Administered 2017-01-23 – 2017-01-25 (×3): via INTRAVENOUS

## 2017-01-23 MED ORDER — ONDANSETRON HCL 4 MG/2ML IJ SOLN: 4.0000 mg | Freq: Four times a day (QID) | INTRAMUSCULAR | Status: DC | PRN

## 2017-01-23 MED ORDER — ORAL CARE MOUTH RINSE: 15.0000 mL | Freq: Four times a day (QID) | OROMUCOSAL | Status: DC

## 2017-01-23 MED ORDER — NICARDIPINE HCL IN NACL 20-0.86 MG/200ML-% IV SOLN
3.0000 mg/h | INTRAVENOUS | Status: DC
  Administered 2017-01-23: 3 mg/h via INTRAVENOUS
  Filled 2017-01-23: qty 200

## 2017-01-23 MED ORDER — FENTANYL CITRATE (PF) 100 MCG/2ML IJ SOLN
50.0000 ug | INTRAMUSCULAR | Status: DC | PRN
  Administered 2017-01-24: 50 ug via INTRAVENOUS
  Filled 2017-01-23: qty 2

## 2017-01-23 MED ORDER — CHLORHEXIDINE GLUCONATE 0.12% ORAL RINSE (MEDLINE KIT)
15.0000 mL | Freq: Two times a day (BID) | OROMUCOSAL | Status: DC
  Administered 2017-01-24: 15 mL via OROMUCOSAL

## 2017-01-23 MED ORDER — PROPOFOL 10 MG/ML IV BOLUS
INTRAVENOUS | Status: AC
  Filled 2017-01-23: qty 20

## 2017-01-23 MED ORDER — ALTEPLASE (STROKE) FULL DOSE INFUSION
0.9000 mg/kg | Freq: Once | INTRAVENOUS | Status: AC
  Administered 2017-01-23: 51 mg via INTRAVENOUS
  Filled 2017-01-23: qty 100

## 2017-01-23 MED ORDER — NICARDIPINE HCL IN NACL 20-0.86 MG/200ML-% IV SOLN
0.0000 mg/h | INTRAVENOUS | Status: DC
  Administered 2017-01-23: 3 mg/h via INTRAVENOUS

## 2017-01-23 MED ORDER — ACETAMINOPHEN 325 MG PO TABS
650.0000 mg | ORAL_TABLET | ORAL | Status: DC | PRN
  Filled 2017-01-23: qty 2

## 2017-01-23 MED ORDER — SUCCINYLCHOLINE CHLORIDE 20 MG/ML IJ SOLN
INTRAMUSCULAR | Status: DC | PRN
  Administered 2017-01-23: 70 mg via INTRAVENOUS

## 2017-01-23 MED ORDER — PANTOPRAZOLE SODIUM 40 MG IV SOLR
40.0000 mg | Freq: Every day | INTRAVENOUS | Status: DC
  Administered 2017-01-24 (×2): 40 mg via INTRAVENOUS
  Filled 2017-01-23 (×2): qty 40

## 2017-01-23 MED ORDER — ACETAMINOPHEN 325 MG PO TABS: 650.0000 mg | ORAL_TABLET | ORAL | Status: DC | PRN

## 2017-01-23 MED ORDER — ACETAMINOPHEN 650 MG RE SUPP: 650.0000 mg | RECTAL | Status: DC | PRN

## 2017-01-23 MED ORDER — NITROGLYCERIN 1 MG/10 ML FOR IR/CATH LAB
INTRA_ARTERIAL | Status: AC
  Administered 2017-01-23 (×2): 25 ug
  Filled 2017-01-23: qty 10

## 2017-01-23 MED ORDER — EPTIFIBATIDE 20 MG/10ML IV SOLN
INTRAVENOUS | Status: AC
  Filled 2017-01-23: qty 10

## 2017-01-23 MED ORDER — ACETAMINOPHEN 160 MG/5ML PO SOLN: 650.0000 mg | ORAL | Status: DC | PRN

## 2017-01-23 MED ORDER — SUFENTANIL CITRATE 50 MCG/ML IV SOLN
INTRAVENOUS | Status: AC
  Filled 2017-01-23: qty 1

## 2017-01-23 MED ORDER — SUFENTANIL CITRATE 50 MCG/ML IV SOLN
INTRAVENOUS | Status: DC | PRN
  Administered 2017-01-23: 30 ug via INTRAVENOUS
  Administered 2017-01-23: 20 ug via INTRAVENOUS

## 2017-01-23 MED ORDER — SODIUM CHLORIDE 0.9 % IV SOLN: INTRAVENOUS | Status: DC

## 2017-01-23 MED ORDER — PROPOFOL 1000 MG/100ML IV EMUL
0.0000 ug/kg/min | INTRAVENOUS | Status: DC
  Administered 2017-01-23: 10 ug/kg/min via INTRAVENOUS
  Administered 2017-01-24: 35 ug/kg/min via INTRAVENOUS
  Administered 2017-01-24: 30 ug/kg/min via INTRAVENOUS
  Administered 2017-01-25: 50 ug/kg/min via INTRAVENOUS
  Filled 2017-01-23 (×5): qty 100

## 2017-01-23 MED ORDER — IOPAMIDOL (ISOVUE-370) INJECTION 76%
INTRAVENOUS | Status: AC
  Filled 2017-01-23: qty 50

## 2017-01-23 MED ORDER — DOCUSATE SODIUM 50 MG/5ML PO LIQD: 100.0000 mg | Freq: Two times a day (BID) | ORAL | Status: DC | PRN

## 2017-01-23 MED ORDER — IOPAMIDOL (ISOVUE-300) INJECTION 61%
INTRAVENOUS | Status: AC
  Administered 2017-01-23: 80 mL
  Filled 2017-01-23: qty 300

## 2017-01-23 MED ORDER — LACTATED RINGERS IV SOLN
INTRAVENOUS | Status: DC | PRN
  Administered 2017-01-23: 20:00:00 via INTRAVENOUS

## 2017-01-23 MED ORDER — PHENYLEPHRINE HCL 10 MG/ML IJ SOLN
INTRAVENOUS | Status: DC | PRN
  Administered 2017-01-23: 27 ug/min via INTRAVENOUS

## 2017-01-23 MED ORDER — ROCURONIUM BROMIDE 100 MG/10ML IV SOLN
INTRAVENOUS | Status: DC | PRN
  Administered 2017-01-23: 40 mg via INTRAVENOUS
  Administered 2017-01-23: 60 mg via INTRAVENOUS

## 2017-01-23 MED ORDER — STROKE: EARLY STAGES OF RECOVERY BOOK
Freq: Once | Status: AC
  Administered 2017-01-24: 01:00:00
  Filled 2017-01-23: qty 1

## 2017-01-23 MED ORDER — CEFAZOLIN SODIUM-DEXTROSE 2-3 GM-% IV SOLR
INTRAVENOUS | Status: DC | PRN
  Administered 2017-01-23: 2 g via INTRAVENOUS

## 2017-01-23 MED ORDER — PROPOFOL 10 MG/ML IV BOLUS
INTRAVENOUS | Status: DC | PRN
  Administered 2017-01-23: 100 mg via INTRAVENOUS
  Administered 2017-01-23: 110 mg via INTRAVENOUS

## 2017-01-23 NOTE — ED Provider Notes (Signed)
[facility] DEPT Provider Note   CSN: [id number] Arrival date & time: 01/23/17  1758     History   Chief Complaint Chief Complaint  Patient presents with  . Code Stroke    HPI Ian Hill is a 66 y.o. male.  The history is provided by the patient. The history is limited by the condition of the patient.  Cerebrovascular Accident  This is a new problem. The current episode started less than 1 hour ago. The problem occurs constantly. The problem has not changed since onset.Pertinent negatives include no chest pain, no abdominal pain, no headaches and no shortness of breath. Nothing aggravates the symptoms. Nothing relieves the symptoms. He has tried nothing for the symptoms.   LVL 5 caveat for inability to answer questions Appropriately in setting of likely acute stroke   No past medical history on file.  Patient Active Problem List   Diagnosis Date Noted  . Acute right arterial ischemic stroke, middle cerebral artery (MCA) ([place]) 01/23/2017  . Acute respiratory failure ([place]) 01/23/2017  . Essential hypertension 01/23/2017  . Acute metabolic encephalopathy 48/18/5631  . Endotracheal tube present   . Cerebrovascular accident (CVA) due to occlusion of right middle cerebral artery ([place])   . Stage 3 chronic kidney disease     No past surgical history on file.     Home Medications    Prior to Admission medications   Not on File    Family History No family history on file.  Social History Social History  Substance Use Topics  . Smoking status: Not on file  . Smokeless tobacco: Not on file  . Alcohol use Not on file     Allergies   Patient has no allergy information on record.   Review of Systems Review of Systems  Unable to perform ROS: Acuity of condition  Respiratory: Negative for shortness of breath.   Cardiovascular: Negative for chest pain.  Gastrointestinal: Negative for abdominal pain.  Neurological: Positive for facial asymmetry, weakness  and numbness. Negative for headaches.     Physical Exam Updated Vital Signs BP (!) 143/79   Pulse 67   Resp 15   Wt 56.6 kg (124 lb 12.5 oz)   SpO2 100%      Physical Exam  Constitutional: He appears well-developed. No distress.  HENT:  Mouth/Throat: Oropharynx is clear and moist. No oropharyngeal exudate.  Eyes: Pupils are equal, round, and reactive to light. Conjunctivae are normal.  Right-sided gaze preference  Neck:  c collar in place  Cardiovascular: Normal rate, normal heart sounds and intact distal pulses.   No extrasystoles are present.  No murmur heard. Pulmonary/Chest: Effort normal. No stridor. No respiratory distress. He has no wheezes. He exhibits no tenderness.  Abdominal: Soft. There is no tenderness.  Musculoskeletal: He exhibits no tenderness.  Neurological: [name] alert. He displays no tremor ( ). A cranial nerve deficit and sensory deficit is present. He exhibits abnormal muscle tone. He displays no seizure activity. GCS eye subscore is 4. GCS verbal subscore is 5. GCS motor subscore is 6.  Left-sided facial droop. Patient not moving left arm or left leg. When pinched, patient had no response and left leg and left arm.   Skin: Capillary refill takes less than 2 seconds. [name] not diaphoretic. No pallor.  Nursing note and vitals reviewed.    ED Treatments / Results  Labs (all labs ordered are listed, but only abnormal results are displayed) Labs Reviewed  CBC - Abnormal; Notable for the  following:       Result Value   Hemoglobin 12.2 (*)    HCT 37.9 (*)    All other components within normal limits  COMPREHENSIVE METABOLIC PANEL - Abnormal; Notable for the following:    Glucose, Bld 107 (*)    BUN 22 (*)    Creatinine, Ser 1.65 (*)    AST 48 (*)    GFR calc non Af Amer 40 (*)    GFR calc Af Amer 47 (*)    All other components within normal limits  CBG MONITORING, ED - Abnormal; Notable for the following:    Glucose-Capillary 102 (*)    All other  components within normal limits  I-STAT CHEM 8, ED - Abnormal; Notable for the following:    BUN 28 (*)    Creatinine, Ser 1.50 (*)    Glucose, Bld 106 (*)    Calcium, Ion 1.09 (*)    All other components within normal limits  MRSA PCR SCREENING  PROTIME-INR  APTT  DIFFERENTIAL  HEMOGLOBIN A1C  LIPID PANEL  TRIGLYCERIDES  BASIC METABOLIC PANEL  MAGNESIUM  CBC WITH DIFFERENTIAL/PLATELET  BASIC METABOLIC PANEL  BLOOD GAS, ARTERIAL  I-STAT TROPONIN, ED    EKG  EKG Interpretation  Date/Time:  Friday January 23 2017 18:52:16 EDT Ventricular Rate:  86 PR Interval:    QRS Duration: 100 QT Interval:  382 QTC Calculation: 457 R Axis:   76 Text Interpretation:  Sinus rhythm Multiple premature complexes, vent & supraven Left atrial enlargement RSR' in V1 or V2, right VCD or RVH LVH with secondary repolarization abnormality Baseline wander in lead(s) V3 ST depressions in leads 2,3,V5,V6 possible ST elevation in lead V2.  Repeat ECG ordered.  Confirmed by Antony Blackbird [phone]) on 01/23/2017 7:00:05 PM       Radiology Ct Angio Head W Or Wo Contrast  Result Date: 01/23/2017 CLINICAL DATA:  Left-sided weakness. Hyperdense right middle cerebral artery. EXAM: CT ANGIOGRAPHY HEAD AND NECK TECHNIQUE: Multidetector CT imaging of the head and neck was performed using the standard protocol during bolus administration of intravenous contrast. Multiplanar CT image reconstructions and MIPs were obtained to evaluate the vascular anatomy. Carotid stenosis measurements (when applicable) are obtained utilizing NASCET criteria, using the distal internal carotid diameter as the denominator. CONTRAST:  50 cc Isovue 370 COMPARISON:  Head CT earlier same day FINDINGS: CTA NECK FINDINGS Aortic arch: Aortic atherosclerosis. No dissection. Branching pattern of the brachiocephalic vessels from the arch is normal. No origin stenosis. Right carotid system: Common carotid artery widely patent to the bifurcation  region. Atherosclerotic disease at the carotid bifurcation and proximal ICA. Minimal diameter of the ICA is 3 mm. Compared to a more distal cervical ICA diameter of 4 mm, this indicates a 25% stenosis. Left carotid system: Common carotid artery widely patent to the bifurcation region. Atherosclerotic disease at the carotid bifurcation and ICA bulb. No narrowing of the ICA bulb beyond that of the more distal cervical ICA and therefore no stenosis. Vertebral arteries: Left vertebral artery is dominant. 20% stenosis at the left vertebral artery origin. Beyond that, the left vertebral artery is widely patent through the cervical region. Right vertebral artery is non dominant. 30% stenosis at the right vertebral artery origin. 50% stenosis of the right vertebral artery 1 cm beyond its origin. Beyond that, the vessel is patent through the cervical region. Flow is not well demonstrated in the distal vessel. See below. Skeleton: Cervical spondylosis.  No advanced disease. Other neck: No mass or  lymphadenopathy. Upper chest: Previous left chest surgery. No active disease presently. Review of the MIP images confirms the above findings CTA HEAD FINDINGS Anterior circulation: Both internal carotid arteries are patent through the skullbase. There is siphon atherosclerotic disease with peripheral calcification. Stenosis estimated at 50% in both carotid siphon regions. The left anterior and middle cerebral vessels are patent. On the right, the anterior cerebral artery is patent. There is an embolus in an the superior division M2 branch on the right with diminished branch vessel visualization. Posterior circulation: Dominant left vertebral artery widely patent to the basilar. Non dominant right vertebral artery shows diminished did any flow within the intracranial portion, suggesting thrombosis or embolic occlusion. The age of this is indeterminate. No basilar stenosis. Superior cerebellar and posterior cerebral arteries are  patent. Venous sinuses: Patent and normal Anatomic variants: None significant Delayed phase: Not performed Review of the MIP images confirms the above findings IMPRESSION: Atherosclerotic disease at both carotid bifurcations. No stenosis on the left. 25% stenosis on the right. Advanced atherosclerotic disease throughout both carotid siphon regions. Stenosis estimated at 50% in both siphon regions. No flow demonstrated in the superior division right M2 branches, consistent with the hyperdense vessel seen at head CT. The patient has had old infarction in the right frontal lobe, but I would assume today's findings or acute. Proximal right vertebral artery stenosis, maximal at 50% 1 cm beyond the vessel origin. Nonvisualization of the distal right vertebral artery which appears to be thrombosed or occluded by embolic disease. The left vertebral artery is the dominant vessel and is widely patent to the basilar. The exact age of the right vertebral artery occlusion cannot be establish with certainty. Page placed at 1900 hours Dr. Cheral Marker. Electronically Signed   By: Nelson Chimes M.D.   On: 01/23/2017 19:08   Ct Angio Neck W And/or Wo Contrast  Result Date: 01/23/2017 CLINICAL DATA:  Left-sided weakness. Hyperdense right middle cerebral artery. EXAM: CT ANGIOGRAPHY HEAD AND NECK TECHNIQUE: Multidetector CT imaging of the head and neck was performed using the standard protocol during bolus administration of intravenous contrast. Multiplanar CT image reconstructions and MIPs were obtained to evaluate the vascular anatomy. Carotid stenosis measurements (when applicable) are obtained utilizing NASCET criteria, using the distal internal carotid diameter as the denominator. CONTRAST:  50 cc Isovue 370 COMPARISON:  Head CT earlier same day FINDINGS: CTA NECK FINDINGS Aortic arch: Aortic atherosclerosis. No dissection. Branching pattern of the brachiocephalic vessels from the arch is normal. No origin stenosis. Right carotid  system: Common carotid artery widely patent to the bifurcation region. Atherosclerotic disease at the carotid bifurcation and proximal ICA. Minimal diameter of the ICA is 3 mm. Compared to a more distal cervical ICA diameter of 4 mm, this indicates a 25% stenosis. Left carotid system: Common carotid artery widely patent to the bifurcation region. Atherosclerotic disease at the carotid bifurcation and ICA bulb. No narrowing of the ICA bulb beyond that of the more distal cervical ICA and therefore no stenosis. Vertebral arteries: Left vertebral artery is dominant. 20% stenosis at the left vertebral artery origin. Beyond that, the left vertebral artery is widely patent through the cervical region. Right vertebral artery is non dominant. 30% stenosis at the right vertebral artery origin. 50% stenosis of the right vertebral artery 1 cm beyond its origin. Beyond that, the vessel is patent through the cervical region. Flow is not well demonstrated in the distal vessel. See below. Skeleton: Cervical spondylosis.  No advanced disease. Other neck: No mass  or lymphadenopathy. Upper chest: Previous left chest surgery. No active disease presently. Review of the MIP images confirms the above findings CTA HEAD FINDINGS Anterior circulation: Both internal carotid arteries are patent through the skullbase. There is siphon atherosclerotic disease with peripheral calcification. Stenosis estimated at 50% in both carotid siphon regions. The left anterior and middle cerebral vessels are patent. On the right, the anterior cerebral artery is patent. There is an embolus in an the superior division M2 branch on the right with diminished branch vessel visualization. Posterior circulation: Dominant left vertebral artery widely patent to the basilar. Non dominant right vertebral artery shows diminished did any flow within the intracranial portion, suggesting thrombosis or embolic occlusion. The age of this is indeterminate. No basilar stenosis.  Superior cerebellar and posterior cerebral arteries are patent. Venous sinuses: Patent and normal Anatomic variants: None significant Delayed phase: Not performed Review of the MIP images confirms the above findings IMPRESSION: Atherosclerotic disease at both carotid bifurcations. No stenosis on the left. 25% stenosis on the right. Advanced atherosclerotic disease throughout both carotid siphon regions. Stenosis estimated at 50% in both siphon regions. No flow demonstrated in the superior division right M2 branches, consistent with the hyperdense vessel seen at head CT. The patient has had old infarction in the right frontal lobe, but I would assume today's findings or acute. Proximal right vertebral artery stenosis, maximal at 50% 1 cm beyond the vessel origin. Nonvisualization of the distal right vertebral artery which appears to be thrombosed or occluded by embolic disease. The left vertebral artery is the dominant vessel and is widely patent to the basilar. The exact age of the right vertebral artery occlusion cannot be establish with certainty. Page placed at 1900 hours Dr. Cheral Marker. Electronically Signed   By: Nelson Chimes M.D.   On: 01/23/2017 19:08   Ct Head Code Stroke Wo Contrast  Addendum Date: 01/23/2017   ADDENDUM REPORT: 01/23/2017 18:40 ADDENDUM: These results were called by telephone at the time of interpretation on 01/23/2017 at 6:35 pm to Dr. Kerney Elbe , who verbally acknowledged these results. Electronically Signed   By: Ulyses Jarred M.D.   On: 01/23/2017 18:40   Result Date: 01/23/2017 CLINICAL DATA:  Code stroke.  Left-sided weakness EXAM: CT HEAD WITHOUT CONTRAST TECHNIQUE: Contiguous axial images were obtained from the base of the skull through the vertex without intravenous contrast. COMPARISON:  None. FINDINGS: Brain: No mass lesion or acute hemorrhage. No focal hypoattenuation of the basal ganglia or cortex to indicate acutely infarcted tissue. Old right frontal lobe infarct with  encephalomalacia. There is multifocal periventricular leukoaraiosis, most commonly seen in the setting of chronic hypertensive microangiopathy. Vascular: There is hyperdensity within the right MCA insular and opercular segments. There is carotid atherosclerosis at the skullbase. Skull: Normal visualized skull base, calvarium and extracranial soft tissues. Sinuses/Orbits: No sinus fluid levels or advanced mucosal thickening. No mastoid effusion. Normal orbits. ASPECTS [facility] Stroke Program Early CT Score) - Ganglionic level infarction (caudate, lentiform nuclei, internal capsule, insula, M1-M3 cortex): 7 - Supraganglionic infarction (M4-M6 cortex): 3 Total score (0-10 with 10 being normal): 10 IMPRESSION: 1. No acute hemorrhage or mass lesion. 2. Hyperdense appearance of the right middle cerebral artery M2/M3 segments may indicate acute thrombus, particularly in the setting of left-sided weakness. Correlation with CTA recommended. 3. Old right frontal infarct. 4. ASPECTS is 10. Dr.  Kerney Elbe was paged at 6:20 PM. Electronically Signed: By: Ulyses Jarred M.D. On: 01/23/2017 18:22    Procedures Procedures (including critical  care time)  CRITICAL CARE Performed by: Gwenyth Allegra Tegeler Total critical care time: 45 minutes Critical care time was exclusive of separately billable procedures and treating other patients. Critical care was necessary to treat or prevent imminent or life-threatening deterioration. Critical care was time spent personally by me on the following activities: development of treatment plan with patient and/or surrogate as well as nursing, discussions with consultants, evaluation of patient's response to treatment, examination of patient, obtaining history from patient or surrogate, ordering and performing treatments and interventions, ordering and review of laboratory studies, ordering and review of radiographic studies, pulse oximetry and re-evaluation of patient's  condition.   Medications Ordered in ED Medications  iopamidol (ISOVUE-370) 76 % injection (not administered)  ceFAZolin (ANCEF) 2-4 GM/100ML-% IVPB (not administered)   stroke: mapping our early stages of recovery book (not administered)  pantoprazole (PROTONIX) injection 40 mg (40 mg Intravenous Given 01/24/17 0002)  acetaminophen (TYLENOL) tablet 650 mg (not administered)    Or  acetaminophen (TYLENOL) solution 650 mg (not administered)    Or  acetaminophen (TYLENOL) suppository 650 mg (not administered)  ondansetron (ZOFRAN) injection 4 mg (not administered)  0.9 %  sodium chloride infusion ( Intravenous New Bag/Given 01/23/17 2320)  nicardipine (CARDENE) 36m in 0.86% saline 2068mIV infusion (0.1 mg/ml) (3 mg/hr Intravenous New Bag/Given 01/23/17 2322)  fentaNYL (SUBLIMAZE) injection 50 mcg (not administered)  fentaNYL (SUBLIMAZE) injection 50 mcg (not administered)  propofol (DIPRIVAN) 1000 MG/100ML infusion (10 mcg/kg/min  56.6 kg Intravenous New Bag/Given 01/23/17 2322)  docusate (COLACE) 50 MG/5ML liquid 100 mg (not administered)  chlorhexidine gluconate (MEDLINE KIT) (PERIDEX) 0.12 % solution 15 mL (15 mLs Mouth Rinse Given 01/24/17 0002)  MEDLINE mouth rinse (15 mLs Mouth Rinse Not Given 01/24/17 0002)  alteplase (ACTIVASE) 1 mg/mL infusion 51 mg (51 mg Intravenous New Bag/Given 01/23/17 1833)  iopamidol (ISOVUE-300) 61 % injection (80 mLs  Contrast Given 01/23/17 2143)  nitroGLYCERIN 100 MCG/ML intra-arterial injection (25 mcg  Given 01/23/17 2104)     Initial Impression / Assessment and Plan / ED Course  I have reviewed the triage vital signs and the nursing notes.  Pertinent labs & imaging results that were available during my care of the patient were reviewed by me and considered in my medical decision making (see chart for details).     Ian Hill a [age].o. male with an unknown past medical history who presents as a code stroke for left-sided deficits. According to  EMS, patient was last normal 40 minutes prior to arrival. According to EMS report, patient went to the bathroom and was found with neurologic deficits. Patient is not moving his left hemibody including left leg, left arm, or left face. Patient is also looking to the right and has a right gaze preference. Patient is perseverating about his right arm after getting an IV placed in the right arm. On arrival, patient's airway was assessed and he was felt to be stable for CT scanning.  On my brief neurologic exam prior CT scanner, patient was not moving his left side. He did not respond to stimuli on the left side. Patient was able to move his right leg and right arm and right face. Patient's pupils were 3 renal meters and reactive bilaterally.  Neurology team that [facility] following up on their evaluation and recommendations.   Neurology recommended TPA administration. Neurology then did CTA showing concern for thrombus. Patient will be taken to IR for thrombectomy.  EKG was performed  showing concern for inverted T waves. Cardiology was called and they felt this was secondary to the stroke and not ACS. Patient denied chest pain. Next  Patient taken to IR without further complications.   Final Clinical Impressions(s) / ED Diagnoses   Final diagnoses:  Stroke [facility])  Cerebrovascular accident (CVA), unspecified mechanism ([place])  Endotracheal tube present     Clinical Impression: 1. Stroke ([place])   2. Cerebrovascular accident (CVA), unspecified mechanism ([place])   3. Endotracheal tube present     Disposition: Admit to neuro ICU after both TPA and thrombectomy    Tegeler, Gwenyth Allegra, MD 01/24/17 [phone]

## 2017-01-23 NOTE — Anesthesia Preprocedure Evaluation (Addendum)
Anesthesia Evaluation  Patient identified by MRN, date of birth, ID band Patient awake    Reviewed: Unable to perform ROS - Chart review onlyPreop documentation limited or incomplete due to emergent nature of procedure.  Airway Mallampati: II  TM Distance: >3 FB Neck ROM: Limited   Comment: C collar Dental  (+) Missing   Pulmonary    breath sounds clear to auscultation       Cardiovascular  Rhythm:Regular  Admission ekg with st depression/twave inversion and few PVCs   Neuro/Psych Acute MCA s/p TPA with left hemiparesis  CVA, Residual Symptoms    GI/Hepatic   Endo/Other    Renal/GU Renal diseaseAcute vs chronic renal failure, Cr 1.5-1.65 on admission     Musculoskeletal   Abdominal   Peds  Hematology   Anesthesia Other Findings Patient brought from ED after actue stroke with left hemiparesis, a+o, c collar post fall, h/o prostate surgery,   Reproductive/Obstetrics                             Anesthesia Physical Anesthesia Plan  ASA: IV and emergent  Anesthesia Plan: General   Post-op Pain Management:    Induction:   PONV Risk Score and Plan: 2 and Ondansetron  Airway Management Planned: Oral ETT  Additional Equipment: Arterial line  Intra-op Plan:   Post-operative Plan:   Informed Consent:   Only emergency history available and History available from chart only  Plan Discussed with: CRNA and Surgeon  Anesthesia Plan Comments:         Anesthesia Quick Evaluation

## 2017-01-23 NOTE — Transfer of Care (Signed)
Immediate Anesthesia Transfer of Care Note  Patient: Ian Hill  Procedure(s) Performed: Procedure(s): RADIOLOGY WITH ANESTHESIA (N/A)  Patient Location: ICU  Anesthesia Type:General  Level of Consciousness: sedated, unresponsive and Patient remains intubated per anesthesia plan  Airway & Oxygen Therapy: Patient remains intubated per anesthesia plan and Patient placed on Ventilator (see vital sign flow sheet for setting)  Post-op Assessment: Report given to RN  Post vital signs: Reviewed and stable  Last Vitals:  Vitals:   01/23/17 1900 01/23/17 1915  BP: 133/77 (!) 143/79  Pulse: 74 67  Resp: 15 15  SpO2: 100% 100%    Last Pain: There were no vitals filed for this visit.       Complications: No apparent anesthesia complications

## 2017-01-23 NOTE — Anesthesia Procedure Notes (Signed)
Procedure Name: Intubation Performed by: Claris Che Pre-anesthesia Checklist: Patient identified, Emergency Drugs available, Suction available and Patient being monitored Patient Re-evaluated:Patient Re-evaluated prior to induction Oxygen Delivery Method: Circle System Utilized Preoxygenation: Pre-oxygenation with 100% oxygen Induction Type: IV induction Ventilation: Mask ventilation without difficulty Laryngoscope Size: Mac and 3 Grade View: Grade II Tube type: Subglottic suction tube Tube size: 8.0 mm Number of attempts: 1 Airway Equipment and Method: Stylet and Oral airway Placement Confirmation: ETT inserted through vocal cords under direct vision,  positive ETCO2 and breath sounds checked- equal and bilateral Secured at: 21 cm Tube secured with: Tape Dental Injury: Teeth and Oropharynx as per pre-operative assessment

## 2017-01-23 NOTE — Progress Notes (Signed)
Patient ID: Ian Hill, male   DOB: 03/20/1947, 66 y.o.   MRN: [id number] INR . Post procedure.RT groin 57F  sheath in position.. No hematoma or bleeding noted. Distal pulses . Both DPs and PTs dopplerable unchanged from prior to procedure. S.Birtie Fellman MD

## 2017-01-23 NOTE — ED Triage Notes (Signed)
Pt to ED via [facility] after reported walking into the bathroom at home and falling.  On EMS arrival a code stroke was called.

## 2017-01-23 NOTE — Progress Notes (Signed)
Patient ID: Ian Hill, male   DOB: 03/20/1947, 66 y.o.   MRN: [id number] Acute onset of  Left sided weakness and Rt gaze deviation  At % 20 pm. Premorbid modified Rankin 0.  NIH 17.   Ct brain  No ICH. ASPECTS score 10 RT MCA hyperdense sign. CTA RT MCA distal M1 occlusion. IV tpa given. Option of endovascular revascularization to prevent further ischemic neurological  Injury iscussed with patients sisters. Procedure,reasons ,risks, alternatives all discussed in detail. Risks of ICH 10 to 15 % ,worsensing neurological function, vent depoendency,death and inability to revascularize all reviewed.. Questions answered .Marland KitchenThey both agreed to proceed with endovascular revascularization under [place]. Informed  witnessed consent obtained. Arlean Hopping MD

## 2017-01-23 NOTE — Procedures (Signed)
S/P RT common carotid arteriogram followed complete revascularization of occluded RT MCA inferior and sup divisions  With x 1 pass with 35mm x 40 mm solitaire FR retrieval device achieving a TICI 3 reperfusion.

## 2017-01-23 NOTE — Anesthesia Procedure Notes (Signed)
Arterial Line Insertion Start/End9/14/2018 7:58 PM, 01/23/2017 8:04 PM  Patient location: OR. Preanesthetic checklist: patient identified, IV checked, site marked, risks and benefits discussed, surgical consent, monitors and equipment checked, pre-op evaluation, timeout performed and anesthesia consent Lidocaine 1% used for infiltration Left, radial was placed Catheter size: 20 Fr Hand hygiene performed  and maximum sterile barriers used   Attempts: 1 Procedure performed without using ultrasound guided technique. Following insertion, dressing applied and Biopatch. Post procedure assessment: normal and unchanged  Patient tolerated the procedure well with no immediate complications.

## 2017-01-23 NOTE — Consult Note (Signed)
PULMONARY / CRITICAL CARE MEDICINE   Name: Ian Hill MRN: [id number] DOB: 03/20/1947    ADMISSION DATE:  01/23/2017 CONSULTATION DATE:  9/14  REFERRING MD: Dora Sims  CHIEF COMPLAINT:  Left sided weakness and confusion   HISTORY OF PRESENT ILLNESS:   66 y.o. male who presents via EMS after wife heard him fall in the bathroom and then noted him to be confused and weak on his left side. Code Stroke was called on arrival to the ED. Patient did receive TPA and then taken to the OR for revascularization of  occluded RT MCA inferior and sup divisions.  Patient came back from IR intubated.   PAST MEDICAL HISTORY :  He  has no past medical history on file.  PAST SURGICAL HISTORY: He  has no past surgical history on file.  Not on File  No current facility-administered medications on file prior to encounter.    No current outpatient prescriptions on file prior to encounter.    FAMILY HISTORY:  His has no family status information on file.    SOCIAL HISTORY: Could not be obtained due to being sedated. No family around when I came to assess him.   REVIEW OF SYSTEMS:   Could not be obtained due to altered mental status   \VITAL SIGNS: BP (!) 143/79   Pulse 67   Resp 15   Wt 56.6 kg (124 lb 12.5 oz)   SpO2 100%    INTAKE / OUTPUT: No intake/output data recorded.  PHYSICAL EXAMINATION: General:  Sedated intubated  Neuro:  Pinpoint pupils, not moving any of his extremities. Sedated  HEENT:  Intubated  Cardiovascular: ejection systolic murmurs Lungs: clear equal air sounds no murmurs  Abdomen:  Soft no tenderness no organomegaly  Musculoskeletal: no edema pulses palpable  Skin:  No rash  LABS:  BMET  Recent Labs Lab 01/23/17 1803 01/23/17 1809  NA 139 141  K 4.4 4.4  CL 102 102  CO2 28  --   BUN 22* 28*  CREATININE 1.65* 1.50*  GLUCOSE 107* 106*    Electrolytes  Recent Labs Lab 01/23/17 1803  CALCIUM 9.4    CBC  Recent Labs Lab  01/23/17 1803 01/23/17 1809  WBC 5.7  --   HGB 12.2* 13.9  HCT 37.9* 41.0  PLT 158  --     Coag's  Recent Labs Lab 01/23/17 1803  APTT 35  INR 1.04    Sepsis Markers No results for input(s): LATICACIDVEN, PROCALCITON, O2SATVEN in the last 168 hours.  ABG No results for input(s): PHART, PCO2ART, PO2ART in the last 168 hours.  Liver Enzymes  Recent Labs Lab 01/23/17 1803  AST 48*  ALT 42  ALKPHOS 86  BILITOT 0.8  ALBUMIN 3.9    Cardiac Enzymes No results for input(s): TROPONINI, PROBNP in the last 168 hours.  Glucose  Recent Labs Lab 01/23/17 1802  GLUCAP 102*    Imaging Ct Angio Head W Or Wo Contrast  Result Date: 01/23/2017 CLINICAL DATA:  Left-sided weakness. Hyperdense right middle cerebral artery. EXAM: CT ANGIOGRAPHY HEAD AND NECK TECHNIQUE: Multidetector CT imaging of the head and neck was performed using the standard protocol during bolus administration of intravenous contrast. Multiplanar CT image reconstructions and MIPs were obtained to evaluate the vascular anatomy. Carotid stenosis measurements (when applicable) are obtained utilizing NASCET criteria, using the distal internal carotid diameter as the denominator. CONTRAST:  50 cc Isovue 370 COMPARISON:  Head CT earlier same day FINDINGS: CTA NECK FINDINGS Aortic arch: Aortic  atherosclerosis. No dissection. Branching pattern of the brachiocephalic vessels from the arch is normal. No origin stenosis. Right carotid system: Common carotid artery widely patent to the bifurcation region. Atherosclerotic disease at the carotid bifurcation and proximal ICA. Minimal diameter of the ICA is 3 mm. Compared to a more distal cervical ICA diameter of 4 mm, this indicates a 25% stenosis. Left carotid system: Common carotid artery widely patent to the bifurcation region. Atherosclerotic disease at the carotid bifurcation and ICA bulb. No narrowing of the ICA bulb beyond that of the more distal cervical ICA and therefore no  stenosis. Vertebral arteries: Left vertebral artery is dominant. 20% stenosis at the left vertebral artery origin. Beyond that, the left vertebral artery is widely patent through the cervical region. Right vertebral artery is non dominant. 30% stenosis at the right vertebral artery origin. 50% stenosis of the right vertebral artery 1 cm beyond its origin. Beyond that, the vessel is patent through the cervical region. Flow is not well demonstrated in the distal vessel. See below. Skeleton: Cervical spondylosis.  No advanced disease. Other neck: No mass or lymphadenopathy. Upper chest: Previous left chest surgery. No active disease presently. Review of the MIP images confirms the above findings CTA HEAD FINDINGS Anterior circulation: Both internal carotid arteries are patent through the skullbase. There is siphon atherosclerotic disease with peripheral calcification. Stenosis estimated at 50% in both carotid siphon regions. The left anterior and middle cerebral vessels are patent. On the right, the anterior cerebral artery is patent. There is an embolus in an the superior division M2 branch on the right with diminished branch vessel visualization. Posterior circulation: Dominant left vertebral artery widely patent to the basilar. Non dominant right vertebral artery shows diminished did any flow within the intracranial portion, suggesting thrombosis or embolic occlusion. The age of this is indeterminate. No basilar stenosis. Superior cerebellar and posterior cerebral arteries are patent. Venous sinuses: Patent and normal Anatomic variants: None significant Delayed phase: Not performed Review of the MIP images confirms the above findings IMPRESSION: Atherosclerotic disease at both carotid bifurcations. No stenosis on the left. 25% stenosis on the right. Advanced atherosclerotic disease throughout both carotid siphon regions. Stenosis estimated at 50% in both siphon regions. No flow demonstrated in the superior division  right M2 branches, consistent with the hyperdense vessel seen at head CT. The patient has had old infarction in the right frontal lobe, but I would assume today's findings or acute. Proximal right vertebral artery stenosis, maximal at 50% 1 cm beyond the vessel origin. Nonvisualization of the distal right vertebral artery which appears to be thrombosed or occluded by embolic disease. The left vertebral artery is the dominant vessel and is widely patent to the basilar. The exact age of the right vertebral artery occlusion cannot be establish with certainty. Page placed at 1900 hours Dr. Cheral Marker. Electronically Signed   By: Nelson Chimes M.D.   On: 01/23/2017 19:08   Ct Angio Neck W And/or Wo Contrast  Result Date: 01/23/2017 CLINICAL DATA:  Left-sided weakness. Hyperdense right middle cerebral artery. EXAM: CT ANGIOGRAPHY HEAD AND NECK TECHNIQUE: Multidetector CT imaging of the head and neck was performed using the standard protocol during bolus administration of intravenous contrast. Multiplanar CT image reconstructions and MIPs were obtained to evaluate the vascular anatomy. Carotid stenosis measurements (when applicable) are obtained utilizing NASCET criteria, using the distal internal carotid diameter as the denominator. CONTRAST:  50 cc Isovue 370 COMPARISON:  Head CT earlier same day FINDINGS: CTA NECK FINDINGS Aortic arch:  Aortic atherosclerosis. No dissection. Branching pattern of the brachiocephalic vessels from the arch is normal. No origin stenosis. Right carotid system: Common carotid artery widely patent to the bifurcation region. Atherosclerotic disease at the carotid bifurcation and proximal ICA. Minimal diameter of the ICA is 3 mm. Compared to a more distal cervical ICA diameter of 4 mm, this indicates a 25% stenosis. Left carotid system: Common carotid artery widely patent to the bifurcation region. Atherosclerotic disease at the carotid bifurcation and ICA bulb. No narrowing of the ICA bulb  beyond that of the more distal cervical ICA and therefore no stenosis. Vertebral arteries: Left vertebral artery is dominant. 20% stenosis at the left vertebral artery origin. Beyond that, the left vertebral artery is widely patent through the cervical region. Right vertebral artery is non dominant. 30% stenosis at the right vertebral artery origin. 50% stenosis of the right vertebral artery 1 cm beyond its origin. Beyond that, the vessel is patent through the cervical region. Flow is not well demonstrated in the distal vessel. See below. Skeleton: Cervical spondylosis.  No advanced disease. Other neck: No mass or lymphadenopathy. Upper chest: Previous left chest surgery. No active disease presently. Review of the MIP images confirms the above findings CTA HEAD FINDINGS Anterior circulation: Both internal carotid arteries are patent through the skullbase. There is siphon atherosclerotic disease with peripheral calcification. Stenosis estimated at 50% in both carotid siphon regions. The left anterior and middle cerebral vessels are patent. On the right, the anterior cerebral artery is patent. There is an embolus in an the superior division M2 branch on the right with diminished branch vessel visualization. Posterior circulation: Dominant left vertebral artery widely patent to the basilar. Non dominant right vertebral artery shows diminished did any flow within the intracranial portion, suggesting thrombosis or embolic occlusion. The age of this is indeterminate. No basilar stenosis. Superior cerebellar and posterior cerebral arteries are patent. Venous sinuses: Patent and normal Anatomic variants: None significant Delayed phase: Not performed Review of the MIP images confirms the above findings IMPRESSION: Atherosclerotic disease at both carotid bifurcations. No stenosis on the left. 25% stenosis on the right. Advanced atherosclerotic disease throughout both carotid siphon regions. Stenosis estimated at 50% in both  siphon regions. No flow demonstrated in the superior division right M2 branches, consistent with the hyperdense vessel seen at head CT. The patient has had old infarction in the right frontal lobe, but I would assume today's findings or acute. Proximal right vertebral artery stenosis, maximal at 50% 1 cm beyond the vessel origin. Nonvisualization of the distal right vertebral artery which appears to be thrombosed or occluded by embolic disease. The left vertebral artery is the dominant vessel and is widely patent to the basilar. The exact age of the right vertebral artery occlusion cannot be establish with certainty. Page placed at 1900 hours Dr. Cheral Marker. Electronically Signed   By: Nelson Chimes M.D.   On: 01/23/2017 19:08   Ct Head Code Stroke Wo Contrast  Addendum Date: 01/23/2017   ADDENDUM REPORT: 01/23/2017 18:40 ADDENDUM: These results were called by telephone at the time of interpretation on 01/23/2017 at 6:35 pm to Dr. Kerney Elbe , who verbally acknowledged these results. Electronically Signed   By: Ulyses Jarred M.D.   On: 01/23/2017 18:40   Result Date: 01/23/2017 CLINICAL DATA:  Code stroke.  Left-sided weakness EXAM: CT HEAD WITHOUT CONTRAST TECHNIQUE: Contiguous axial images were obtained from the base of the skull through the vertex without intravenous contrast. COMPARISON:  None. FINDINGS: Brain:  No mass lesion or acute hemorrhage. No focal hypoattenuation of the basal ganglia or cortex to indicate acutely infarcted tissue. Old right frontal lobe infarct with encephalomalacia. There is multifocal periventricular leukoaraiosis, most commonly seen in the setting of chronic hypertensive microangiopathy. Vascular: There is hyperdensity within the right MCA insular and opercular segments. There is carotid atherosclerosis at the skullbase. Skull: Normal visualized skull base, calvarium and extracranial soft tissues. Sinuses/Orbits: No sinus fluid levels or advanced mucosal thickening. No mastoid  effusion. Normal orbits. ASPECTS [facility] Stroke Program Early CT Score) - Ganglionic level infarction (caudate, lentiform nuclei, internal capsule, insula, M1-M3 cortex): 7 - Supraganglionic infarction (M4-M6 cortex): 3 Total score (0-10 with 10 being normal): 10 IMPRESSION: 1. No acute hemorrhage or mass lesion. 2. Hyperdense appearance of the right middle cerebral artery M2/M3 segments may indicate acute thrombus, particularly in the setting of left-sided weakness. Correlation with CTA recommended. 3. Old right frontal infarct. 4. ASPECTS is 10. Dr.  Kerney Elbe was paged at 6:20 PM. Electronically Signed: By: Ulyses Jarred M.D. On: 01/23/2017 18:22      SIGNIFICANT EVENTS: Intubated 9/14 Revascularization of right MCA  9/14  ASSESSMENT / PLAN:  - Acute Right MCA stroke with left hemiparesis and s/p revascularization  - hypertension  - altered mental status, metabolic encephalopathy secondary to acute stroke - acute respiratory failure requiring mechanical ventilation  - chronic kidney disease    Plan: - goal SBP 130 - start Cardene drip as needed - sedate with propofol - antiplatelet as per neuro  - PPI - SCD for DVT prophylaxis   I have spent 38 mons of CC time bedside or in the unit exclusive of any billable procedures. Patient is needing ICU due to resp failure requiring mechanical ventilation   FAMILY  - Updates: no family available at the time  - Inter-disciplinary family meet or Palliative Care meeting due by:  9/21    Pulmonary and [place] Pager: [phone]  01/23/2017, 9:54 PM

## 2017-01-23 NOTE — Consult Note (Addendum)
Admission H&P    Chief Complaint: Sudden onset of left hemiplegia, left facial droop, rightward eye deviation and left hemineglect  HPI: Ian Hill is an 66 y.o. male who presents via EMS after wife heard him fall in the bathroom and then noted him to be confused and weak on his left side. Code Stroke was called on arrival to the ED.   CT head: 1. No acute hemorrhage or mass lesion. 2. Hyperdense appearance of the right middle cerebral artery M2/M3 segments may indicate acute thrombus, particularly in the setting of left-sided weakness. Correlation with CTA recommended. 3. Old right frontal infarct. 4. ASPECTS is 10.   CTA head and neck:  -Atherosclerotic disease at both carotid bifurcations. No stenosis on the left. 25% stenosis on the right. -Advanced atherosclerotic disease throughout both carotid siphon regions. Stenosis estimated at 50% in both siphon regions. -No flow demonstrated in the superior division right M2 branches, consistent with the hyperdense vessel seen at head CT. The patient has had old infarction in the right frontal lobe, but I would assume today's findings or acute. -Proximal right vertebral artery stenosis, maximal at 50% 1 cm beyond the vessel origin. Nonvisualization of the distal right vertebral artery which appears to be thrombosed or occluded by embolic disease. The left vertebral artery is the dominant vessel and is widely patent to the basilar. The exact age of the right vertebral artery occlusion cannot be establish with certainty.   LSN: 5:20 PM NIHSS = 19 tPA Given: Yes  PMHx: HTN, recent prostate surgery.   No past surgical history on file.  No family history on file. Social History:  has no tobacco, alcohol, and drug history on file.  Allergies: Not on File  Home Medications: Not taking ASA, Plavix or a blood thinner per family  ROS: Patient denies any symptoms except for RUE pain after placement of IV. Denies left sided  weakness. Unable to obtain detailed ROS due to confusion and agitation.   Physical Examination: Weight 56.6 kg (124 lb 12.5 oz).  HEENT-  [place]/AT  Lungs - Respirations unlabored Extremities - Warm and well perfused  Neurologic Examination:  Mental Status: Agitated. Awake. Oriented to self and age. Not oriented to month. Able to follow some motor commands but not all due to agitation and inattention. Limited speech output is fluent. Left hemineglect and anosognosia regarding left sided weakness.  Cranial Nerves: II:  No blink to threat on the left. PERRL.  III,IV, VI: ptosis not present, rightward eye deviation with inability to cross midline to left, but can be overcome with doll's eye maneuver V,VII: Left facial droop. Decreased response to left sided facial stimuli.  VIII: hearing intact to some commands IX,X: no hypophonia or hoarseness XI: Head preferentially rotated to right XII: Does not protrude tongue to command Motor: RUE: 5/5 RLE: 5/5 LUE: Fluctuates between 0/5 and 2-3/5 proximally. 0/5 distally LLE: 2-3/5 proximal and distal Sensory: Decreased response to noxious LUE and LLE. Normal responses on right.  Deep Tendon Reflexes: Hypoactive to LUE and LLE. Right toe downgoing, left toe upgoing.  Cerebellar: No ataxia with FNF on right. Unable to perform on left.  Gait: Unable to assess.    Results for orders placed or performed during the hospital encounter of 01/23/17 (from the past 48 hour(s))  CBG monitoring, ED     Status: Abnormal   Collection Time: 01/23/17  6:02 PM  Result Value Ref Range   Glucose-Capillary 102 (H) 65 - 99 mg/dL  Protime-INR  Status: None   Collection Time: 01/23/17  6:03 PM  Result Value Ref Range   Prothrombin Time 13.5 11.4 - 15.2 seconds   INR 1.04   APTT     Status: None   Collection Time: 01/23/17  6:03 PM  Result Value Ref Range   aPTT 35 24 - 36 seconds  CBC     Status: Abnormal   Collection Time: 01/23/17  6:03 PM  Result Value  Ref Range   WBC 5.7 4.0 - 10.5 K/uL   RBC 4.25 4.22 - 5.81 MIL/uL   Hemoglobin 12.2 (L) 13.0 - 17.0 g/dL   HCT 37.9 (L) 39.0 - 52.0 %   MCV 89.2 78.0 - 100.0 fL   MCH 28.7 26.0 - 34.0 pg   MCHC 32.2 30.0 - 36.0 g/dL   RDW 15.4 11.5 - 15.5 %   Platelets 158 150 - 400 K/uL  Differential     Status: None   Collection Time: 01/23/17  6:03 PM  Result Value Ref Range   Neutrophils Relative % 31 %   Neutro Abs 1.8 1.7 - 7.7 K/uL   Lymphocytes Relative 58 %   Lymphs Abs 3.3 0.7 - 4.0 K/uL   Monocytes Relative 9 %   Monocytes Absolute 0.5 0.1 - 1.0 K/uL   Eosinophils Relative 2 %   Eosinophils Absolute 0.1 0.0 - 0.7 K/uL   Basophils Relative 0 %   Basophils Absolute 0.0 0.0 - 0.1 K/uL  I-Stat Chem 8, ED     Status: Abnormal   Collection Time: 01/23/17  6:09 PM  Result Value Ref Range   Sodium 141 135 - 145 mmol/L   Potassium 4.4 3.5 - 5.1 mmol/L   Chloride 102 101 - 111 mmol/L   BUN 28 (H) 6 - 20 mg/dL   Creatinine, Ser 1.50 (H) 0.61 - 1.24 mg/dL   Glucose, Bld 106 (H) 65 - 99 mg/dL   Calcium, Ion 1.09 (L) 1.15 - 1.40 mmol/L   TCO2 30 22 - 32 mmol/L   Hemoglobin 13.9 13.0 - 17.0 g/dL   HCT 41.0 39.0 - 52.0 %   Ct Head Code Stroke Wo Contrast  Result Date: 01/23/2017 CLINICAL DATA:  Code stroke.  Left-sided weakness EXAM: CT HEAD WITHOUT CONTRAST TECHNIQUE: Contiguous axial images were obtained from the base of the skull through the vertex without intravenous contrast. COMPARISON:  None. FINDINGS: Brain: No mass lesion or acute hemorrhage. No focal hypoattenuation of the basal ganglia or cortex to indicate acutely infarcted tissue. Old right frontal lobe infarct with encephalomalacia. There is multifocal periventricular leukoaraiosis, most commonly seen in the setting of chronic hypertensive microangiopathy. Vascular: There is hyperdensity within the right MCA insular and opercular segments. There is carotid atherosclerosis at the skullbase. Skull: Normal visualized skull base, calvarium  and extracranial soft tissues. Sinuses/Orbits: No sinus fluid levels or advanced mucosal thickening. No mastoid effusion. Normal orbits. ASPECTS [facility] Stroke Program Early CT Score) - Ganglionic level infarction (caudate, lentiform nuclei, internal capsule, insula, M1-M3 cortex): 7 - Supraganglionic infarction (M4-M6 cortex): 3 Total score (0-10 with 10 being normal): 10 IMPRESSION: 1. No acute hemorrhage or mass lesion. 2. Hyperdense appearance of the right middle cerebral artery M2/M3 segments may indicate acute thrombus, particularly in the setting of left-sided weakness. Correlation with CTA recommended. 3. Old right frontal infarct. 4. ASPECTS is 10. Dr.  Kerney Elbe was paged at 6:20 PM. Electronically Signed   By: Ulyses Jarred M.D.   On: 01/23/2017 18:22    Assessment:  66 y.o. male with acute ischemic stroke secondary to right M2/M3 occlusion 1. NIHSS = 19 2. Exam findings consistent with large right MCA stroke and/or perfusion deficit 3. Stroke Risk Factors - HTN  Plan: 1. No absolute contraindications to tPA 2. Obtained consent for tPA from family after extensive discussion of risks versus benefits of administration versus no administration of tPA. The patient is agitated, confused and not aware of his left sided deficit and therefore unable to provide informed consent. Consent process witnessed by RN 3. No contrast allergy per family. Able to obtain CTA. Family gave informed consent for CTA after discussion of risks/benefits of contrast administration. Estimated GFR is 47.  4. The patient is a possible candidate for endovascular treatment depending upon response to tPA. Family stated they wanted everything done, including possible endovascular procedure if necessary. Risks/benefits discussed. Case discussed with Dr. Estanislado Pandy who has called in the endovascular team.  5. Post-tPA order set. If endovascular procedure is performed, additional post-procedure orders per Dr. Estanislado Pandy 6. Will  admit to ICU under Neurology service 7. Completion of stroke work up when stable to include MRI brain and TTE, as well as cardiac telemetry 8. PT consult, OT consult, Speech consult 9. No antiplatelet medications or anticoagulants for at least 24 hours following IV tPA. Can consider if repeat CT at 24 hours is negative for hemorrhage 10. DVT prophylaxis with SCDs  60 minutes spent in the emergent Neurological evaluation and management of this acute stroke patient.   Electronically signed: Dr. Kerney Elbe 01/23/2017, 6:34 PM

## 2017-01-23 NOTE — ED Notes (Signed)
Pt to IR

## 2017-01-24 ENCOUNTER — Inpatient Hospital Stay (HOSPITAL_COMMUNITY): Payer: Medicare Other

## 2017-01-24 ENCOUNTER — Encounter (HOSPITAL_COMMUNITY): Payer: Self-pay | Admitting: Internal Medicine

## 2017-01-24 DIAGNOSIS — I472 Ventricular tachycardia: Secondary | ICD-10-CM

## 2017-01-24 DIAGNOSIS — G9341 Metabolic encephalopathy: Secondary | ICD-10-CM

## 2017-01-24 DIAGNOSIS — Z978 Presence of other specified devices: Secondary | ICD-10-CM

## 2017-01-24 DIAGNOSIS — I493 Ventricular premature depolarization: Secondary | ICD-10-CM

## 2017-01-24 DIAGNOSIS — I361 Nonrheumatic tricuspid (valve) insufficiency: Secondary | ICD-10-CM

## 2017-01-24 DIAGNOSIS — R748 Abnormal levels of other serum enzymes: Secondary | ICD-10-CM

## 2017-01-24 DIAGNOSIS — E43 Unspecified severe protein-calorie malnutrition: Secondary | ICD-10-CM | POA: Insufficient documentation

## 2017-01-24 DIAGNOSIS — I63511 Cerebral infarction due to unspecified occlusion or stenosis of right middle cerebral artery: Secondary | ICD-10-CM

## 2017-01-24 DIAGNOSIS — I351 Nonrheumatic aortic (valve) insufficiency: Secondary | ICD-10-CM

## 2017-01-24 DIAGNOSIS — R9431 Abnormal electrocardiogram [ECG] [EKG]: Secondary | ICD-10-CM

## 2017-01-24 LAB — HEMOGLOBIN A1C
Hgb A1c MFr Bld: 5.7 % — ABNORMAL HIGH (ref 4.8–5.6)
Mean Plasma Glucose: 116.89 mg/dL

## 2017-01-24 LAB — LIPID PANEL
CHOLESTEROL: 135 mg/dL (ref 0–200)
HDL: 48 mg/dL (ref >40)
LDL CALC: 68 mg/dL (ref 0–99)
TRIGLYCERIDES: 93 mg/dL (ref <150)
Total CHOL/HDL Ratio: 2.8 RATIO
VLDL: 19 mg/dL (ref 0–40)

## 2017-01-24 LAB — BASIC METABOLIC PANEL
ANION GAP: 9 (ref 5–15)
Anion gap: 6 (ref 5–15)
BUN: 19 mg/dL (ref 6–20)
BUN: 19 mg/dL (ref 6–20)
CHLORIDE: 106 mmol/L (ref 101–111)
CHLORIDE: 109 mmol/L (ref 101–111)
CO2: 23 mmol/L (ref 22–32)
CO2: 23 mmol/L (ref 22–32)
CREATININE: 1.15 mg/dL (ref 0.61–1.24)
Calcium: 8.4 mg/dL — ABNORMAL LOW (ref 8.9–10.3)
Calcium: 8.5 mg/dL — ABNORMAL LOW (ref 8.9–10.3)
Creatinine, Ser: 1.2 mg/dL (ref 0.61–1.24)
GFR calc Af Amer: >60 mL/min (ref >60)
GFR calc Af Amer: >60 mL/min (ref >60)
GFR calc non Af Amer: 59 mL/min — ABNORMAL LOW (ref >60)
GFR calc non Af Amer: >60 mL/min (ref >60)
GLUCOSE: 93 mg/dL (ref 65–99)
Glucose, Bld: 79 mg/dL (ref 65–99)
POTASSIUM: 3.8 mmol/L (ref 3.5–5.1)
Potassium: 3.6 mmol/L (ref 3.5–5.1)
Sodium: 138 mmol/L (ref 135–145)
Sodium: 138 mmol/L (ref 135–145)

## 2017-01-24 LAB — ECHOCARDIOGRAM COMPLETE
Ao-asc: 32 cm
CHL CUP RV SYS PRESS: 36 mmHg
FS: 31 % (ref 28–44)
HEIGHTINCHES: 70 in
IV/PV OW: 0.89
LA vol A4C: 79.4 ml
LA vol index: 70.2 mL/m2
LA vol: 99.5 mL
LADIAMINDEX: 2.75 cm/m2
LASIZE: 39 mm
LDCA: 4.15 cm2
LEFT ATRIUM END SYS DIAM: 39 mm
LVOT SV: 62 mL
LVOT VTI: 14.9 cm
LVOT peak vel: 73.2 cm/s
LVOTD: 23 mm
PW: 12.4 mm — AB (ref 0.6–1.1)
RV LATERAL S' VELOCITY: 9.37 cm/s
Reg peak vel: 288 cm/s
TAPSE: 20.7 mm
TRMAXVEL: 288 cm/s
Weight: 1500.8 oz

## 2017-01-24 LAB — BLOOD GAS, ARTERIAL
Acid-base deficit: 0.2 mmol/L (ref 0.0–2.0)
BICARBONATE: 24.2 mmol/L (ref 20.0–28.0)
Drawn by: 51133
FIO2: 80
LHR: 12 {breaths}/min
O2 Saturation: 99.4 %
PATIENT TEMPERATURE: 98.6
PEEP: 5 cmH2O
PH ART: 7.385 (ref 7.350–7.450)
VT: 600 mL
pCO2 arterial: 41.3 mmHg (ref 32.0–48.0)
pO2, Arterial: 248 mmHg — ABNORMAL HIGH (ref 83.0–108.0)

## 2017-01-24 LAB — RAPID URINE DRUG SCREEN, HOSP PERFORMED
AMPHETAMINES: NOT DETECTED
BARBITURATES: NOT DETECTED
BENZODIAZEPINES: NOT DETECTED
Cocaine: NOT DETECTED
Opiates: NOT DETECTED
TETRAHYDROCANNABINOL: NOT DETECTED

## 2017-01-24 LAB — GLUCOSE, CAPILLARY
GLUCOSE-CAPILLARY: 54 mg/dL — AB (ref 65–99)
GLUCOSE-CAPILLARY: 68 mg/dL (ref 65–99)
GLUCOSE-CAPILLARY: 76 mg/dL (ref 65–99)
Glucose-Capillary: 78 mg/dL (ref 65–99)
Glucose-Capillary: 28 mg/dL — CL (ref 65–99)
Glucose-Capillary: 87 mg/dL (ref 65–99)
Glucose-Capillary: 31 mg/dL — CL (ref 65–99)

## 2017-01-24 LAB — MAGNESIUM
Magnesium: 2 mg/dL (ref 1.7–2.4)
Magnesium: 2.1 mg/dL (ref 1.7–2.4)
Magnesium: 2.1 mg/dL (ref 1.7–2.4)

## 2017-01-24 LAB — PHOSPHORUS
PHOSPHORUS: 3 mg/dL (ref 2.5–4.6)
Phosphorus: 4 mg/dL (ref 2.5–4.6)

## 2017-01-24 LAB — CBC WITH DIFFERENTIAL/PLATELET
Basophils Absolute: 0 10*3/uL (ref 0.0–0.1)
Basophils Relative: 0 %
Eosinophils Absolute: 0.1 10*3/uL (ref 0.0–0.7)
Eosinophils Relative: 2 %
HEMATOCRIT: 33.9 % — AB (ref 39.0–52.0)
HEMOGLOBIN: 10.9 g/dL — AB (ref 13.0–17.0)
LYMPHS ABS: 1.9 10*3/uL (ref 0.7–4.0)
LYMPHS PCT: 34 %
MCH: 28 pg (ref 26.0–34.0)
MCHC: 32.2 g/dL (ref 30.0–36.0)
MCV: 87.1 fL (ref 78.0–100.0)
MONOS PCT: 11 %
Monocytes Absolute: 0.6 10*3/uL (ref 0.1–1.0)
NEUTROS ABS: 3 10*3/uL (ref 1.7–7.7)
NEUTROS PCT: 54 %
Platelets: 142 10*3/uL — ABNORMAL LOW (ref 150–400)
RBC: 3.89 MIL/uL — ABNORMAL LOW (ref 4.22–5.81)
RDW: 15.3 % (ref 11.5–15.5)
WBC: 5.6 10*3/uL (ref 4.0–10.5)

## 2017-01-24 LAB — TRIGLYCERIDES: Triglycerides: 66 mg/dL (ref <150)

## 2017-01-24 LAB — TROPONIN I
Troponin I: 0.19 ng/mL (ref <0.03)
Troponin I: 0.18 ng/mL (ref <0.03)
Troponin I: 0.14 ng/mL (ref <0.03)

## 2017-01-24 LAB — MRSA PCR SCREENING: MRSA by PCR: NEGATIVE

## 2017-01-24 MED ORDER — VITAL HIGH PROTEIN PO LIQD
1000.0000 mL | ORAL | Status: DC
  Administered 2017-01-25: 1000 mL

## 2017-01-24 MED ORDER — VITAL HIGH PROTEIN PO LIQD
1000.0000 mL | ORAL | Status: DC
  Administered 2017-01-24: 1000 mL

## 2017-01-24 MED ORDER — POTASSIUM CHLORIDE 20 MEQ/15ML (10%) PO SOLN
40.0000 meq | Freq: Once | ORAL | Status: AC
  Administered 2017-01-24: 40 meq
  Filled 2017-01-24: qty 30

## 2017-01-24 MED ORDER — INSULIN ASPART 100 UNIT/ML ~~LOC~~ SOLN
0.0000 [IU] | SUBCUTANEOUS | Status: DC
  Administered 2017-01-27: 2 [IU] via SUBCUTANEOUS

## 2017-01-24 MED ORDER — PRO-STAT SUGAR FREE PO LIQD: 30.0000 mL | Freq: Two times a day (BID) | ORAL | Status: DC

## 2017-01-24 MED ORDER — ASPIRIN 325 MG PO TABS: 325.0000 mg | ORAL_TABLET | Freq: Every day | ORAL | Status: DC

## 2017-01-24 MED ORDER — SIMVASTATIN 40 MG PO TABS
40.0000 mg | ORAL_TABLET | Freq: Every day | ORAL | Status: DC
  Administered 2017-01-24 – 2017-01-27 (×2): 40 mg via ORAL
  Filled 2017-01-24 (×3): qty 1

## 2017-01-24 MED ORDER — HEPARIN SODIUM (PORCINE) 5000 UNIT/ML IJ SOLN
5000.0000 [IU] | Freq: Two times a day (BID) | INTRAMUSCULAR | Status: DC
  Administered 2017-01-24 – 2017-01-27 (×6): 5000 [IU] via SUBCUTANEOUS
  Filled 2017-01-24 (×6): qty 1

## 2017-01-24 MED ORDER — HEPARIN SODIUM (PORCINE) 5000 UNIT/ML IJ SOLN: 5000.0000 [IU] | Freq: Three times a day (TID) | INTRAMUSCULAR | Status: DC

## 2017-01-24 MED ORDER — ORAL CARE MOUTH RINSE
15.0000 mL | OROMUCOSAL | Status: DC
  Administered 2017-01-24 – 2017-01-25 (×13): 15 mL via OROMUCOSAL

## 2017-01-24 MED ORDER — ASPIRIN 325 MG PO TABS
325.0000 mg | ORAL_TABLET | Freq: Every day | ORAL | Status: DC
  Administered 2017-01-24: 325 mg via ORAL
  Filled 2017-01-24: qty 1

## 2017-01-24 MED ORDER — CHLORHEXIDINE GLUCONATE 0.12% ORAL RINSE (MEDLINE KIT)
15.0000 mL | Freq: Two times a day (BID) | OROMUCOSAL | Status: DC
Start: 2017-01-24 — End: 2017-01-25
  Administered 2017-01-24 – 2017-01-25 (×3): 15 mL via OROMUCOSAL

## 2017-01-24 MED ORDER — DEXTROSE 50 % IV SOLN
25.0000 mL | Freq: Once | INTRAVENOUS | Status: AC
  Administered 2017-01-24: 25 mL via INTRAVENOUS

## 2017-01-24 MED ORDER — DEXTROSE 50 % IV SOLN
INTRAVENOUS | Status: AC
  Administered 2017-01-24: 25 mL
  Filled 2017-01-24: qty 50

## 2017-01-24 NOTE — Progress Notes (Signed)
Initial Nutrition Assessment  DOCUMENTATION CODES:  Underweight, Severe malnutrition in context of chronic illness  INTERVENTION:  Initiate TF via OGT/NGT with Vital High Protein at goal rate of 35 ml/h (840 ml per day)to provide 840 kcals (receiving additional 362 kcls from diprivan), 74 gm protein, 702 ml free water daily.  NUTRITION DIAGNOSIS:  Inadequate oral intake related to inability to eat as evidenced by NPO status.  GOAL:  Patient will meet greater than or equal to 90% of their needs  MONITOR:  Diet advancement, Labs, Weight trends, I & O's, Vent status, TF tolerance  REASON FOR ASSESSMENT:  Consult Enteral/tube feeding initiation and management  ASSESSMENT:  66 y/o male PMHx HTN and recent prostate surgery. Presented via EMS after found by family to have neurologic deficits on L hemibody. CT revealed R MCA stroke and Thrombus s/p revascularization 9/14. Remained intubated after procedure. RD consulted for TF management.   Pt is intubated, sedated. No historians present.   Currently in reverse Trendelenburg after sheath removal. Pt did not have ht listed. No wt history in chart. Estimated with measuring tape. Bed weight shows 93.8 lbs.   Physical exam: Severe muscle wasting of clavicular musculature, deltoids, gastrocnemius and quadriceps. Severe fat wasting of thorax. Mild-moderate fat wasting of triceps. Abdomen is soft, non distended.   IVF infusing at 40cc/hr.   Patient is currently intubated on ventilator support MV: 7.2 L/min Temp (24hrs), Avg:97.1 F (36.2 C), Min:96.3 F (35.7 C), Max:97.6 F (36.4 C)  Propofol: 13.7 ml/hr = 362 kcals/day  Labs: Bgs: 55-110, Lipid panel all WDL, A1C was 5/7.  Meds: Vital High Protein, Propofol   Recent Labs Lab 01/23/17 0042 01/23/17 1803 01/23/17 1809 01/24/17 0604 01/24/17 1043  NA 138 139 141 138  --   K 3.8 4.4 4.4 3.6  --   CL 106 102 102 109  --   CO2 23 28  --  23  --   BUN 19 22* 28* 19  --    CREATININE 1.20 1.65* 1.50* 1.15  --   CALCIUM 8.4* 9.4  --  8.5*  --   MG 2.0  --   --   --  2.1  PHOS  --   --   --   --  3.0  GLUCOSE 93 107* 106* 79  --    Diet Order:  Diet NPO time specified  Skin: Abrasion to chin  Last BM:  Unknown  Height:  Ht Readings from Last 1 Encounters:  01/24/17 5\' 10"  (1.778 m)   Weight:  Wt Readings from Last 1 Encounters:  01/24/17 93 lb 12.8 oz (42.5 kg)   Ideal Body Weight:  75.45 kg  BMI:  Body mass index is 13.46 kg/m.  Estimated Nutritional Needs:  Kcal:  1230 kcals ([facility] 2003 B) Protein:  64-77g Pro (1.5-1.8 g/kg bw) Fluid:  >1.3 l (30 ml/kg bw)  EDUCATION NEEDS:  No education needs identified at this time  Burtis Junes RD, LDN, [facility] Pager: [phone] 01/24/2017 12:35 PM

## 2017-01-24 NOTE — Progress Notes (Signed)
CRITICAL VALUE ALERT  Critical Value:  Troponin 0.19  Date & Time Notied:  01/24/2017 0720  Provider Notified: Dr. Vaughan Browner  Orders Received/Actions taken: No new orders received at this time

## 2017-01-24 NOTE — Consult Note (Addendum)
CARDIOLOGY CONSULT NOTE  Patient ID: Ian Hill, MRN: [id number], DOB/AGE: May 17, 1950 66 y.o. Admit date: 01/23/2017 Date of Consult: 01/24/2017  Primary Physician: [name], MD Primary Cardiologist: unknown Ian Hill is a 66 y.o. male who is being seen today for the evaluation of 3 E. is done.  at the request of dr Glendon Hill.    Chief Complaint: + troponin   HPI Ian Hill is a 66 y.o. male  Admitted with acute stroke   He was admitted 9/14 following collapse with left-sided weakness. Code Stroke was called. He was given TPA and then went for surgical revascularization of an occluded right MCA.  + Tn 0.18-19 (with interval of about 5 hrs  ECG abnormal See Below   From his meds, appears he has HTN, prob vascular disease ( on statin) and prostatism   [name] now intubated. There is no family and no records in EPIC or CareEverywhere--no chance to get more data    No past medical history on file.    Surgical History: No past surgical history on file.   Home Meds: Prior to Admission medications   Medication Sig Start Date End Date Taking? Authorizing Provider  atenolol (TENORMIN) 25 MG tablet Take 25 mg by mouth every morning.   Yes [provider]  cephALEXin (KEFLEX) 500 MG capsule Take 500 mg by mouth 2 (two) times daily.    Yes [provider]  finasteride (PROSCAR) 5 MG tablet Take 5 mg by mouth daily.   Yes [provider]  hydrochlorothiazide (HYDRODIURIL) 25 MG tablet Take 25 mg by mouth daily.   Yes [provider]  metoprolol succinate (TOPROL-XL) 25 MG 24 hr tablet Take 75 mg by mouth daily.   Yes [provider]  Nutritional Supplements (ENSURE PO) Take 237 mLs by mouth 2 (two) times daily between meals.    Yes [provider]  polyethylene glycol (MIRALAX / GLYCOLAX) packet Take 17 g by mouth daily as needed for mild constipation.    Yes [provider]  sennosides-docusate sodium  (SENOKOT-S) 8.6-50 MG tablet Take 2 tablets by mouth daily.    Yes [provider]  simvastatin (ZOCOR) 40 MG tablet Take 40 mg by mouth every morning.   Yes [provider]  tamsulosin (FLOMAX) 0.4 MG CAPS capsule Take 0.4 mg by mouth.   Yes [provider]    Inpatient Medications:  . chlorhexidine gluconate (MEDLINE KIT)  15 mL Mouth Rinse BID  . [START ON 01/25/2017] feeding supplement (VITAL HIGH PROTEIN)  1,000 mL Per Tube Q24H  . insulin aspart  0-15 Units Subcutaneous Q4H  . mouth rinse  15 mL Mouth Rinse 10 times per day  . pantoprazole (PROTONIX) IV  40 mg Intravenous QHS    Allergies: Not on File  Social History   Social History  . Marital status: Single    Spouse name: N/A  . Number of children: N/A  . Years of education: N/A   Occupational History  . Not on file.   Social History Main Topics  . Smoking status: Not on file  . Smokeless tobacco: Not on file  . Alcohol use Not on file  . Drug use: Unknown  . Sexual activity: Not on file   Other Topics Concern  . Not on file   Social History Narrative  . No narrative on file     No family history on file.   ROS:  Please see the history of present  illness. No information available   Physical Exam: Blood pressure 103/64, pulse (!) 55, temperature 97.6 F (36.4 C), temperature source Axillary, resp. rate 12, height '5\' 10"'  (1.778 m), weight 93 lb 12.8 oz (42.5 kg), SpO2 100 %. General: Well developed, cachectic AA male intubated Head: Normocephalic, atraumatic, sclera non-icteric, no xanthomas, nares are without discharge. EENT: normal  Lymph Nodes:  none Neck: Negative for carotid bruits. JVD notable to discern Back:not examined Lungs: Clear laterally to auscultation without wheezes, rales, or rhonchi.   Heart: Irregular RR with S1 S2.  2/6 systolic murmur . No rubs, or gallops appreciated. Abdomen: Soft,   No rebound/guarding. No obvious abdominal masses. Msk:  Not able to  assess Extremities: No clubbing or cyanosis. No edema.  Distal pedal pulses are 2+ and equal bilaterally. Skin: Warm and Dry Neuro:  Psych:  Sedated and not moving     Labs: Cardiac Enzymes  Recent Labs  01/24/17 0604 01/24/17 1043  TROPONINI 0.19* 0.18*   CBC Lab Results  Component Value Date   WBC 5.6 01/24/2017   HGB 10.9 (L) 01/24/2017   HCT 33.9 (L) 01/24/2017   MCV 87.1 01/24/2017   PLT 142 (L) 01/24/2017   PROTIME:  Recent Labs  01/23/17 1803  LABPROT 13.5  INR 1.04   Chemistry  Recent Labs Lab 01/23/17 1803  01/24/17 0604  NA 139  < > 138  K 4.4  < > 3.6  CL 102  < > 109  CO2 28  --  23  BUN 22*  < > 19  CREATININE 1.65*  < > 1.15  CALCIUM 9.4  --  8.5*  PROT 6.8  --   --   BILITOT 0.8  --   --   ALKPHOS 86  --   --   ALT 42  --   --   AST 48*  --   --   GLUCOSE 107*  < > 79  < > = values in this interval not displayed. Lipids Lab Results  Component Value Date   CHOL 135 01/24/2017   HDL 48 01/24/2017   LDLCALC 68 01/24/2017   TRIG 93 01/24/2017   BNP No results found for: PROBNP Thyroid Function Tests: No results for input(s): TSH, T4TOTAL, T3FREE, THYROIDAB in the last 72 hours.  Invalid input(s): FREET3 Miscellaneous No results found for: DDIMER  Radiology/Studies:  Ct Angio Head W Or Wo Contrast  Result Date: 01/23/2017 CLINICAL DATA:  Left-sided weakness. Hyperdense right middle cerebral artery. EXAM: CT ANGIOGRAPHY HEAD AND NECK TECHNIQUE: Multidetector CT imaging of the head and neck was performed using the standard protocol during bolus administration of intravenous contrast. Multiplanar CT image reconstructions and MIPs were obtained to evaluate the vascular anatomy. Carotid stenosis measurements (when applicable) are obtained utilizing NASCET criteria, using the distal internal carotid diameter as the denominator. CONTRAST:  50 cc Isovue 370 COMPARISON:  Head CT earlier same day FINDINGS: CTA NECK FINDINGS Aortic arch: Aortic  atherosclerosis. No dissection. Branching pattern of the brachiocephalic vessels from the arch is normal. No origin stenosis. Right carotid system: Common carotid artery widely patent to the bifurcation region. Atherosclerotic disease at the carotid bifurcation and proximal ICA. Minimal diameter of the ICA is 3 mm. Compared to a more distal cervical ICA diameter of 4 mm, this indicates a 25% stenosis. Left carotid system: Common carotid artery widely patent to the bifurcation region. Atherosclerotic disease at the carotid bifurcation and ICA bulb. No narrowing of the ICA bulb beyond that of  the more distal cervical ICA and therefore no stenosis. Vertebral arteries: Left vertebral artery is dominant. 20% stenosis at the left vertebral artery origin. Beyond that, the left vertebral artery is widely patent through the cervical region. Right vertebral artery is non dominant. 30% stenosis at the right vertebral artery origin. 50% stenosis of the right vertebral artery 1 cm beyond its origin. Beyond that, the vessel is patent through the cervical region. Flow is not well demonstrated in the distal vessel. See below. Skeleton: Cervical spondylosis.  No advanced disease. Other neck: No mass or lymphadenopathy. Upper chest: Previous left chest surgery. No active disease presently. Review of the MIP images confirms the above findings CTA HEAD FINDINGS Anterior circulation: Both internal carotid arteries are patent through the skullbase. There is siphon atherosclerotic disease with peripheral calcification. Stenosis estimated at 50% in both carotid siphon regions. The left anterior and middle cerebral vessels are patent. On the right, the anterior cerebral artery is patent. There is an embolus in an the superior division M2 branch on the right with diminished branch vessel visualization. Posterior circulation: Dominant left vertebral artery widely patent to the basilar. Non dominant right vertebral artery shows diminished did  any flow within the intracranial portion, suggesting thrombosis or embolic occlusion. The age of this is indeterminate. No basilar stenosis. Superior cerebellar and posterior cerebral arteries are patent. Venous sinuses: Patent and normal Anatomic variants: None significant Delayed phase: Not performed Review of the MIP images confirms the above findings IMPRESSION: Atherosclerotic disease at both carotid bifurcations. No stenosis on the left. 25% stenosis on the right. Advanced atherosclerotic disease throughout both carotid siphon regions. Stenosis estimated at 50% in both siphon regions. No flow demonstrated in the superior division right M2 branches, consistent with the hyperdense vessel seen at head CT. The patient has had old infarction in the right frontal lobe, but I would assume today's findings or acute. Proximal right vertebral artery stenosis, maximal at 50% 1 cm beyond the vessel origin. Nonvisualization of the distal right vertebral artery which appears to be thrombosed or occluded by embolic disease. The left vertebral artery is the dominant vessel and is widely patent to the basilar. The exact age of the right vertebral artery occlusion cannot be establish with certainty. Page placed at 1900 hours Dr. Cheral Marker. Electronically Signed   By: Nelson Chimes M.D.   On: 01/23/2017 19:08   Ct Head Wo Contrast  Result Date: 01/23/2017 CLINICAL DATA:  Status post stroke intervention. Left-sided weakness. EXAM: CT HEAD WITHOUT CONTRAST TECHNIQUE: Contiguous axial images were obtained from the base of the skull through the vertex without intravenous contrast. COMPARISON:  Head CT and CTA 01/23/2017 FINDINGS: Brain: There is an old right frontal lobe infarct. No intraparenchymal hematoma. No midline shift or other mass effect. No extra-axial collection. Vascular: There is persistent contrast enhancement of the proximal intracranial arteries, greatest in the right middle cerebral artery. There is trace  parenchymal/subarachnoid contrast staining adjacent to the right M1 segment. Skull: Normal. Negative for fracture or focal lesion. Sinuses/Orbits: Small amount of fluid in left maxillary sinus. Other: None IMPRESSION: 1. Minimal contrast staining adjacent to the M1 segment of the right middle cerebral artery status post intervention. No intraparenchymal hematoma or extra-axial collection. 2. Old right frontal lobe infarct. Electronically Signed   By: Ulyses Jarred M.D.   On: 01/23/2017 22:12   Ct Angio Neck W And/or Wo Contrast  Result Date: 01/23/2017 CLINICAL DATA:  Left-sided weakness. Hyperdense right middle cerebral artery. EXAM: CT ANGIOGRAPHY HEAD  AND NECK TECHNIQUE: Multidetector CT imaging of the head and neck was performed using the standard protocol during bolus administration of intravenous contrast. Multiplanar CT image reconstructions and MIPs were obtained to evaluate the vascular anatomy. Carotid stenosis measurements (when applicable) are obtained utilizing NASCET criteria, using the distal internal carotid diameter as the denominator. CONTRAST:  50 cc Isovue 370 COMPARISON:  Head CT earlier same day FINDINGS: CTA NECK FINDINGS Aortic arch: Aortic atherosclerosis. No dissection. Branching pattern of the brachiocephalic vessels from the arch is normal. No origin stenosis. Right carotid system: Common carotid artery widely patent to the bifurcation region. Atherosclerotic disease at the carotid bifurcation and proximal ICA. Minimal diameter of the ICA is 3 mm. Compared to a more distal cervical ICA diameter of 4 mm, this indicates a 25% stenosis. Left carotid system: Common carotid artery widely patent to the bifurcation region. Atherosclerotic disease at the carotid bifurcation and ICA bulb. No narrowing of the ICA bulb beyond that of the more distal cervical ICA and therefore no stenosis. Vertebral arteries: Left vertebral artery is dominant. 20% stenosis at the left vertebral artery origin.  Beyond that, the left vertebral artery is widely patent through the cervical region. Right vertebral artery is non dominant. 30% stenosis at the right vertebral artery origin. 50% stenosis of the right vertebral artery 1 cm beyond its origin. Beyond that, the vessel is patent through the cervical region. Flow is not well demonstrated in the distal vessel. See below. Skeleton: Cervical spondylosis.  No advanced disease. Other neck: No mass or lymphadenopathy. Upper chest: Previous left chest surgery. No active disease presently. Review of the MIP images confirms the above findings CTA HEAD FINDINGS Anterior circulation: Both internal carotid arteries are patent through the skullbase. There is siphon atherosclerotic disease with peripheral calcification. Stenosis estimated at 50% in both carotid siphon regions. The left anterior and middle cerebral vessels are patent. On the right, the anterior cerebral artery is patent. There is an embolus in an the superior division M2 branch on the right with diminished branch vessel visualization. Posterior circulation: Dominant left vertebral artery widely patent to the basilar. Non dominant right vertebral artery shows diminished did any flow within the intracranial portion, suggesting thrombosis or embolic occlusion. The age of this is indeterminate. No basilar stenosis. Superior cerebellar and posterior cerebral arteries are patent. Venous sinuses: Patent and normal Anatomic variants: None significant Delayed phase: Not performed Review of the MIP images confirms the above findings IMPRESSION: Atherosclerotic disease at both carotid bifurcations. No stenosis on the left. 25% stenosis on the right. Advanced atherosclerotic disease throughout both carotid siphon regions. Stenosis estimated at 50% in both siphon regions. No flow demonstrated in the superior division right M2 branches, consistent with the hyperdense vessel seen at head CT. The patient has had old infarction in the  right frontal lobe, but I would assume today's findings or acute. Proximal right vertebral artery stenosis, maximal at 50% 1 cm beyond the vessel origin. Nonvisualization of the distal right vertebral artery which appears to be thrombosed or occluded by embolic disease. The left vertebral artery is the dominant vessel and is widely patent to the basilar. The exact age of the right vertebral artery occlusion cannot be establish with certainty. Page placed at 1900 hours Dr. Cheral Marker. Electronically Signed   By: Nelson Chimes M.D.   On: 01/23/2017 19:08   Dg Chest Port 1 View  Result Date: 01/23/2017 CLINICAL DATA:  Hypoxia EXAM: PORTABLE CHEST 1 VIEW COMPARISON:  None. FINDINGS: Endotracheal tube  tip is 6.3 cm above carina. Nasogastric tube tip and side port below the diaphragm. No pneumothorax. There is patchy bibasilar atelectasis. There is postoperative change on the left. There is no edema or consolidation. Heart is mildly enlarged with pulmonary vascularity within normal limits. No evident adenopathy. No bone lesions. There is aortic atherosclerosis IMPRESSION: Tube positions as described without pneumothorax. Postoperative change on the left. Bibasilar atelectasis. No edema or consolidation. Heart mildly enlarged. There is aortic atherosclerosis. Aortic Atherosclerosis (ICD10-I70.0). Electronically Signed   By: Lowella Grip III M.D.   On: 01/23/2017 22:49   Dg Abd Portable 1v  Result Date: 01/24/2017 CLINICAL DATA:  Orogastric placement EXAM: PORTABLE ABDOMEN - 1 VIEW COMPARISON:  None. FINDINGS: Orogastric tube enters the stomach and is coiled with its tip in the fundus. Gas pattern is unremarkable. Urinary tract contrast following vascular procedure. IMPRESSION: Orogastric tube tip coiled in the fundus. Electronically Signed   By: Nelson Chimes M.D.   On: 01/24/2017 09:02   Ct Head Code Stroke Wo Contrast  Addendum Date: 01/23/2017   ADDENDUM REPORT: 01/23/2017 18:40 ADDENDUM: These results were  called by telephone at the time of interpretation on 01/23/2017 at 6:35 pm to Dr. Kerney Elbe , who verbally acknowledged these results. Electronically Signed   By: Ulyses Jarred M.D.   On: 01/23/2017 18:40   Result Date: 01/23/2017 CLINICAL DATA:  Code stroke.  Left-sided weakness EXAM: CT HEAD WITHOUT CONTRAST TECHNIQUE: Contiguous axial images were obtained from the base of the skull through the vertex without intravenous contrast. COMPARISON:  None. FINDINGS: Brain: No mass lesion or acute hemorrhage. No focal hypoattenuation of the basal ganglia or cortex to indicate acutely infarcted tissue. Old right frontal lobe infarct with encephalomalacia. There is multifocal periventricular leukoaraiosis, most commonly seen in the setting of chronic hypertensive microangiopathy. Vascular: There is hyperdensity within the right MCA insular and opercular segments. There is carotid atherosclerosis at the skullbase. Skull: Normal visualized skull base, calvarium and extracranial soft tissues. Sinuses/Orbits: No sinus fluid levels or advanced mucosal thickening. No mastoid effusion. Normal orbits. ASPECTS [facility] Stroke Program Early CT Score) - Ganglionic level infarction (caudate, lentiform nuclei, internal capsule, insula, M1-M3 cortex): 7 - Supraganglionic infarction (M4-M6 cortex): 3 Total score (0-10 with 10 being normal): 10 IMPRESSION: 1. No acute hemorrhage or mass lesion. 2. Hyperdense appearance of the right middle cerebral artery M2/M3 segments may indicate acute thrombus, particularly in the setting of left-sided weakness. Correlation with CTA recommended. 3. Old right frontal infarct. 4. ASPECTS is 10. Dr.  Kerney Elbe was paged at 6:20 PM. Electronically Signed: By: Ulyses Jarred M.D. On: 01/23/2017 18:22    EKG: Sinus rhythm with frequent PVCs LBBB inferior axis with RS avL transition V4  with left ventricular hypertrophy and repolarization abnormalities consistent with apical hypertrophic  cardiomyopathy  Telemetry Personally reviewed  Runs of nonsustained VT, Freq PVCs LBBB infer axis  Assessment and Plan:  Troponin elevation  Abnormal ECG  VT  Stroke s/p TPA and EMBOLECTOMY   The pts tropnin elevation in the context of stroke and abnormal ECG is not particularly concerning, but will check two more to see how the trend might inform thinking.  Any anticoagulation is per neuro, but dont think there is an urgent indication   His ECG is very abnormal and may all be 2/2 LVH and hypertension; it is also concerning for HCM.  He will need an echo.  His PVC burden is noteworthy.   If his LV function is depressed  it will be important to determine how it may be related to he PVCs.    His VT NS is concerning and its implications are best understood in the context of any structural heart disease.  Echo is necessary   Pt has 2 medical record numbers Pmhx  HCM apical variant      Ian Hill

## 2017-01-24 NOTE — Progress Notes (Signed)
SLP Cancellation Note  Patient Details Name: Jathniel Smeltzer MRN: [id number] DOB: 17-Aug-1950   Cancelled treatment:       Reason Eval/Treat Not Completed: Medical issues which prohibited therapy. Pt on ventilator per charting. Will follow up.  Deneise Lever, [place], CCC-SLP Speech-Language Pathologist [phone]   Aliene Altes 01/24/2017, 1:18 PM

## 2017-01-24 NOTE — Progress Notes (Signed)
OT Cancellation Note  Patient Details Name: Ian Hill MRN: [id number] DOB: 03/20/1947   Cancelled Treatment:    Reason Eval/Treat Not Completed: Patient not medically ready (active bedrest orders).  Binnie Kand M.S., OTR/L Pager: [phone]  01/24/2017, 7:48 AM

## 2017-01-24 NOTE — Progress Notes (Signed)
PULMONARY / CRITICAL CARE MEDICINE   Name: Ian Hill MRN: [id number] DOB: 03/20/1947    ADMISSION DATE:  01/23/2017 CONSULTATION DATE:  01/23/2017  REFERRING MD:  Estanislado Pandy  CHIEF COMPLAINT:  stroke   BRIEF HISTORY OF PRESENT ILLNESS:   66 y/o male with a history of hypertension and recent "prostate surgery" presented with acute R MCA stroke.  Received tpa IV, had persistent symptoms so went for cerebral angiogram with clot retrieval.  PCCM consulted for vent management.    SUBJECTIVE:  Troponin positive Ectopy Remains on vent  VITAL SIGNS: BP 121/61   Pulse (!) 49   Temp 97.6 F (36.4 C) (Axillary)   Resp 12   Wt 124 lb 12.5 oz (56.6 kg)   SpO2 100%   HEMODYNAMICS:    VENTILATOR SETTINGS: Vent Mode: PRVC FiO2 (%):  [40 %-100 %] 40 % Set Rate:  [12 bmp] 12 bmp Vt Set:  [600 mL] 600 mL PEEP:  [5 cmH20] 5 cmH20 Plateau Pressure:  [19 cmH20-20 cmH20] 19 cmH20  INTAKE / OUTPUT: I/O last 3 completed shifts: In: 1468.5 [I.V.:1468.5] Out: 600 [Urine:550; Blood:50]  PHYSICAL EXAMINATION:  General:  In bed on vent HENT: NCAT ETT in place PULM: CTA B, vent supported breathing CV: RRR, no mgr GI: BS+, soft, nontender MSK: normal bulk and tone Neuro: sedated on vent but follows commands on right     LABS:  BMET  Recent Labs Lab 01/23/17 0042 01/23/17 1803 01/23/17 1809 01/24/17 0604  NA 138 139 141 138  K 3.8 4.4 4.4 3.6  CL 106 102 102 109  CO2 23 28  --  23  BUN 19 22* 28* 19  CREATININE 1.20 1.65* 1.50* 1.15  GLUCOSE 93 107* 106* 79    Electrolytes  Recent Labs Lab 01/23/17 0042 01/23/17 1803 01/24/17 0604  CALCIUM 8.4* 9.4 8.5*  MG 2.0  --   --     CBC  Recent Labs Lab 01/23/17 1803 01/23/17 1809 01/24/17 0604  WBC 5.7  --  5.6  HGB 12.2* 13.9 10.9*  HCT 37.9* 41.0 33.9*  PLT 158  --  142*    Coag's  Recent Labs Lab 01/23/17 1803  APTT 35  INR 1.04    Sepsis Markers No results for input(s): LATICACIDVEN,  PROCALCITON, O2SATVEN in the last 168 hours.  ABG  Recent Labs Lab 01/23/17 2359  PHART 7.385  PCO2ART 41.3  PO2ART 248*    Liver Enzymes  Recent Labs Lab 01/23/17 1803  AST 48*  ALT 42  ALKPHOS 86  BILITOT 0.8  ALBUMIN 3.9    Cardiac Enzymes  Recent Labs Lab 01/24/17 0604  TROPONINI 0.19*    Glucose  Recent Labs Lab 01/23/17 1802  GLUCAP 102*    Imaging Ct Angio Head W Or Wo Contrast  Result Date: 01/23/2017 CLINICAL DATA:  Left-sided weakness. Hyperdense right middle cerebral artery. EXAM: CT ANGIOGRAPHY HEAD AND NECK TECHNIQUE: Multidetector CT imaging of the head and neck was performed using the standard protocol during bolus administration of intravenous contrast. Multiplanar CT image reconstructions and MIPs were obtained to evaluate the vascular anatomy. Carotid stenosis measurements (when applicable) are obtained utilizing NASCET criteria, using the distal internal carotid diameter as the denominator. CONTRAST:  50 cc Isovue 370 COMPARISON:  Head CT earlier same day FINDINGS: CTA NECK FINDINGS Aortic arch: Aortic atherosclerosis. No dissection. Branching pattern of the brachiocephalic vessels from the arch is normal. No origin stenosis. Right carotid system: Common carotid artery widely patent to the bifurcation  region. Atherosclerotic disease at the carotid bifurcation and proximal ICA. Minimal diameter of the ICA is 3 mm. Compared to a more distal cervical ICA diameter of 4 mm, this indicates a 25% stenosis. Left carotid system: Common carotid artery widely patent to the bifurcation region. Atherosclerotic disease at the carotid bifurcation and ICA bulb. No narrowing of the ICA bulb beyond that of the more distal cervical ICA and therefore no stenosis. Vertebral arteries: Left vertebral artery is dominant. 20% stenosis at the left vertebral artery origin. Beyond that, the left vertebral artery is widely patent through the cervical region. Right vertebral artery is  non dominant. 30% stenosis at the right vertebral artery origin. 50% stenosis of the right vertebral artery 1 cm beyond its origin. Beyond that, the vessel is patent through the cervical region. Flow is not well demonstrated in the distal vessel. See below. Skeleton: Cervical spondylosis.  No advanced disease. Other neck: No mass or lymphadenopathy. Upper chest: Previous left chest surgery. No active disease presently. Review of the MIP images confirms the above findings CTA HEAD FINDINGS Anterior circulation: Both internal carotid arteries are patent through the skullbase. There is siphon atherosclerotic disease with peripheral calcification. Stenosis estimated at 50% in both carotid siphon regions. The left anterior and middle cerebral vessels are patent. On the right, the anterior cerebral artery is patent. There is an embolus in an the superior division M2 branch on the right with diminished branch vessel visualization. Posterior circulation: Dominant left vertebral artery widely patent to the basilar. Non dominant right vertebral artery shows diminished did any flow within the intracranial portion, suggesting thrombosis or embolic occlusion. The age of this is indeterminate. No basilar stenosis. Superior cerebellar and posterior cerebral arteries are patent. Venous sinuses: Patent and normal Anatomic variants: None significant Delayed phase: Not performed Review of the MIP images confirms the above findings IMPRESSION: Atherosclerotic disease at both carotid bifurcations. No stenosis on the left. 25% stenosis on the right. Advanced atherosclerotic disease throughout both carotid siphon regions. Stenosis estimated at 50% in both siphon regions. No flow demonstrated in the superior division right M2 branches, consistent with the hyperdense vessel seen at head CT. The patient has had old infarction in the right frontal lobe, but I would assume today's findings or acute. Proximal right vertebral artery stenosis,  maximal at 50% 1 cm beyond the vessel origin. Nonvisualization of the distal right vertebral artery which appears to be thrombosed or occluded by embolic disease. The left vertebral artery is the dominant vessel and is widely patent to the basilar. The exact age of the right vertebral artery occlusion cannot be establish with certainty. Page placed at 1900 hours Dr. Cheral Marker. Electronically Signed   By: Nelson Chimes M.D.   On: 01/23/2017 19:08   Ct Head Wo Contrast  Result Date: 01/23/2017 CLINICAL DATA:  Status post stroke intervention. Left-sided weakness. EXAM: CT HEAD WITHOUT CONTRAST TECHNIQUE: Contiguous axial images were obtained from the base of the skull through the vertex without intravenous contrast. COMPARISON:  Head CT and CTA 01/23/2017 FINDINGS: Brain: There is an old right frontal lobe infarct. No intraparenchymal hematoma. No midline shift or other mass effect. No extra-axial collection. Vascular: There is persistent contrast enhancement of the proximal intracranial arteries, greatest in the right middle cerebral artery. There is trace parenchymal/subarachnoid contrast staining adjacent to the right M1 segment. Skull: Normal. Negative for fracture or focal lesion. Sinuses/Orbits: Small amount of fluid in left maxillary sinus. Other: None IMPRESSION: 1. Minimal contrast staining adjacent to the  M1 segment of the right middle cerebral artery status post intervention. No intraparenchymal hematoma or extra-axial collection. 2. Old right frontal lobe infarct. Electronically Signed   By: Ulyses Jarred M.D.   On: 01/23/2017 22:12   Ct Angio Neck W And/or Wo Contrast  Result Date: 01/23/2017 CLINICAL DATA:  Left-sided weakness. Hyperdense right middle cerebral artery. EXAM: CT ANGIOGRAPHY HEAD AND NECK TECHNIQUE: Multidetector CT imaging of the head and neck was performed using the standard protocol during bolus administration of intravenous contrast. Multiplanar CT image reconstructions and MIPs  were obtained to evaluate the vascular anatomy. Carotid stenosis measurements (when applicable) are obtained utilizing NASCET criteria, using the distal internal carotid diameter as the denominator. CONTRAST:  50 cc Isovue 370 COMPARISON:  Head CT earlier same day FINDINGS: CTA NECK FINDINGS Aortic arch: Aortic atherosclerosis. No dissection. Branching pattern of the brachiocephalic vessels from the arch is normal. No origin stenosis. Right carotid system: Common carotid artery widely patent to the bifurcation region. Atherosclerotic disease at the carotid bifurcation and proximal ICA. Minimal diameter of the ICA is 3 mm. Compared to a more distal cervical ICA diameter of 4 mm, this indicates a 25% stenosis. Left carotid system: Common carotid artery widely patent to the bifurcation region. Atherosclerotic disease at the carotid bifurcation and ICA bulb. No narrowing of the ICA bulb beyond that of the more distal cervical ICA and therefore no stenosis. Vertebral arteries: Left vertebral artery is dominant. 20% stenosis at the left vertebral artery origin. Beyond that, the left vertebral artery is widely patent through the cervical region. Right vertebral artery is non dominant. 30% stenosis at the right vertebral artery origin. 50% stenosis of the right vertebral artery 1 cm beyond its origin. Beyond that, the vessel is patent through the cervical region. Flow is not well demonstrated in the distal vessel. See below. Skeleton: Cervical spondylosis.  No advanced disease. Other neck: No mass or lymphadenopathy. Upper chest: Previous left chest surgery. No active disease presently. Review of the MIP images confirms the above findings CTA HEAD FINDINGS Anterior circulation: Both internal carotid arteries are patent through the skullbase. There is siphon atherosclerotic disease with peripheral calcification. Stenosis estimated at 50% in both carotid siphon regions. The left anterior and middle cerebral vessels are  patent. On the right, the anterior cerebral artery is patent. There is an embolus in an the superior division M2 branch on the right with diminished branch vessel visualization. Posterior circulation: Dominant left vertebral artery widely patent to the basilar. Non dominant right vertebral artery shows diminished did any flow within the intracranial portion, suggesting thrombosis or embolic occlusion. The age of this is indeterminate. No basilar stenosis. Superior cerebellar and posterior cerebral arteries are patent. Venous sinuses: Patent and normal Anatomic variants: None significant Delayed phase: Not performed Review of the MIP images confirms the above findings IMPRESSION: Atherosclerotic disease at both carotid bifurcations. No stenosis on the left. 25% stenosis on the right. Advanced atherosclerotic disease throughout both carotid siphon regions. Stenosis estimated at 50% in both siphon regions. No flow demonstrated in the superior division right M2 branches, consistent with the hyperdense vessel seen at head CT. The patient has had old infarction in the right frontal lobe, but I would assume today's findings or acute. Proximal right vertebral artery stenosis, maximal at 50% 1 cm beyond the vessel origin. Nonvisualization of the distal right vertebral artery which appears to be thrombosed or occluded by embolic disease. The left vertebral artery is the dominant vessel and is widely patent  to the basilar. The exact age of the right vertebral artery occlusion cannot be establish with certainty. Page placed at 1900 hours Dr. Cheral Marker. Electronically Signed   By: Nelson Chimes M.D.   On: 01/23/2017 19:08   Dg Chest Port 1 View  Result Date: 01/23/2017 CLINICAL DATA:  Hypoxia EXAM: PORTABLE CHEST 1 VIEW COMPARISON:  None. FINDINGS: Endotracheal tube tip is 6.3 cm above carina. Nasogastric tube tip and side port below the diaphragm. No pneumothorax. There is patchy bibasilar atelectasis. There is postoperative  change on the left. There is no edema or consolidation. Heart is mildly enlarged with pulmonary vascularity within normal limits. No evident adenopathy. No bone lesions. There is aortic atherosclerosis IMPRESSION: Tube positions as described without pneumothorax. Postoperative change on the left. Bibasilar atelectasis. No edema or consolidation. Heart mildly enlarged. There is aortic atherosclerosis. Aortic Atherosclerosis (ICD10-I70.0). Electronically Signed   By: Lowella Grip III M.D.   On: 01/23/2017 22:49   Ct Head Code Stroke Wo Contrast  Addendum Date: 01/23/2017   ADDENDUM REPORT: 01/23/2017 18:40 ADDENDUM: These results were called by telephone at the time of interpretation on 01/23/2017 at 6:35 pm to Dr. Kerney Elbe , who verbally acknowledged these results. Electronically Signed   By: Ulyses Jarred M.D.   On: 01/23/2017 18:40   Result Date: 01/23/2017 CLINICAL DATA:  Code stroke.  Left-sided weakness EXAM: CT HEAD WITHOUT CONTRAST TECHNIQUE: Contiguous axial images were obtained from the base of the skull through the vertex without intravenous contrast. COMPARISON:  None. FINDINGS: Brain: No mass lesion or acute hemorrhage. No focal hypoattenuation of the basal ganglia or cortex to indicate acutely infarcted tissue. Old right frontal lobe infarct with encephalomalacia. There is multifocal periventricular leukoaraiosis, most commonly seen in the setting of chronic hypertensive microangiopathy. Vascular: There is hyperdensity within the right MCA insular and opercular segments. There is carotid atherosclerosis at the skullbase. Skull: Normal visualized skull base, calvarium and extracranial soft tissues. Sinuses/Orbits: No sinus fluid levels or advanced mucosal thickening. No mastoid effusion. Normal orbits. ASPECTS [facility] Stroke Program Early CT Score) - Ganglionic level infarction (caudate, lentiform nuclei, internal capsule, insula, M1-M3 cortex): 7 - Supraganglionic infarction (M4-M6  cortex): 3 Total score (0-10 with 10 being normal): 10 IMPRESSION: 1. No acute hemorrhage or mass lesion. 2. Hyperdense appearance of the right middle cerebral artery M2/M3 segments may indicate acute thrombus, particularly in the setting of left-sided weakness. Correlation with CTA recommended. 3. Old right frontal infarct. 4. ASPECTS is 10. Dr.  Kerney Elbe was paged at 6:20 PM. Electronically Signed: By: Ulyses Jarred M.D. On: 01/23/2017 18:22     STUDIES:  9/14 CTHead> hyperdense R MCA 9/14 CT angio head/neck> occluded R M2 branch, appearance of old R frontal stroke  CULTURES: none  ANTIBIOTICS: 9/15 cefazolin x1  SIGNIFICANT EVENTS: 9/14 admit, R MCA M2 clot retrieval post IV TPA  LINES/TUBES: 9/14 ETT  DISCUSSION: 66 y/o male with hypertension, "prostate problems" here with acute R MCA territory stroke requiring neuro IR intervention.  After admission has positive troponin and ectopy.  ASSESSMENT / PLAN:  NEUROLOGIC A:   Acute R MCA stroke P:   RASS goal: 0 to -1 Continue PAD protocol with propofol Post neuro orderset and secondary stroke pervention per neuro/stroke team  PULMONARY A: Acute respiratory failure with hypoxemia due to inability to protect airway Emphysema seen on CXR  P:   Full mechanical vent support VAP prevention Daily WUA/SBT Wean to extubate after tonight's MRI Pulmonary toilette  CARDIOVASCULAR A:  Hypertension Elevated troponin  EKG: LVH, some junctional beats Ectopy P:  Tele Add b-blocker if UDS clean Continue hypertension management per neurology goals and neurology order set (cardene) F/u echo No antiplatelet or anticoagulants until OK by neuro Monitor electrolytes  RENAL A:   Hypokalemia P:   Monitor BMET and UOP Replace electrolytes as needed  GASTROINTESTINAL A:   No acute issues P:   Start nutrition Pantoprazole for stress ulcer prophylaxis  HEMATOLOGIC A:   No acute issues P:  Monitor for  bleeding  INFECTIOUS A:   No acute issues P:   Monitor for fever  ENDOCRINE A:   Hyperglycemia   P:   Start SSI F/u Hgb A1c   FAMILY  - Updates: none bedside  My cc time 33 minutes  Roselie Awkward, MD  PCCM Pager: [phone] Cell: [phone] After 3pm or if no response, call [phone]   01/24/2017, 7:55 AM

## 2017-01-24 NOTE — Progress Notes (Signed)
STROKE TEAM PROGRESS NOTE   HISTORY OF PRESENT ILLNESS (per record) Ian Hill is an 66 y.o. male who presents via EMS after wife heard him fall in the bathroom and then noted him to be confused and weak on his left side. Code Stroke was called on arrival to the ED.   CT head: 1. No acute hemorrhage or mass lesion. 2. Hyperdense appearance of the right middle cerebral artery M2/M3 segments may indicate acute thrombus, particularly in the setting of left-sided weakness. Correlation with CTA recommended. 3. Old right frontal infarct. 4. ASPECTS is 10.   CTA head and neck:  -Atherosclerotic disease at both carotid bifurcations. No stenosis on the left. 25% stenosis on the right. -Advanced atherosclerotic disease throughout both carotid siphon regions. Stenosis estimated at 50% in both siphon regions. -No flow demonstrated in the superior division right M2 branches, consistent with the hyperdense vessel seen at head CT. The patient has had old infarction in the right frontal lobe, but I would assume today's findings or acute. -Proximal right vertebral artery stenosis, maximal at 50% 1 cm beyond the vessel origin. Nonvisualization of the distal right vertebral artery which appears to be thrombosed or occluded by embolic disease. The left vertebral artery is the dominant vessel and is widely patent to the basilar. The exact age of the right vertebral artery occlusion cannot be establish with certainty.   LSN: 5:20 PM NIHSS = 19 tPA Given: Yes - Friday 01/23/17 at 1815  CEREBRAL ANGIOGRAM S/P RT common carotid arteriogram followed complete revascularization of occluded RT MCA inferior and sup divisions  With x 1 pass with 60mm x 40 mm solitaire FR retrieval device achieving a TICI 3 reperfusion.   SUBJECTIVE (INTERVAL HISTORY) The patient is intubated and sedated. There are multiple family members at the bedside including 2 sisters and neice.BP adequately controlled. Needs  sedation for agitation. Groin sheath to be removed this am   OBJECTIVE Temp:  [96.3 F (35.7 C)-97.6 F (36.4 C)] 97.6 F (36.4 C) (09/15 0400) Pulse Rate:  [41-74] 49 (09/15 0630) Resp:  [12-21] 12 (09/15 0630) BP: (104-149)/(53-79) 121/61 (09/15 0630) SpO2:  [98 %-100 %] 100 % (09/15 0630) Arterial Line BP: (101-160)/(51-73) 137/57 (09/15 0630) FiO2 (%):  [40 %-100 %] 40 % (09/15 0405) Weight:  [124 lb 12.5 oz (56.6 kg)] 124 lb 12.5 oz (56.6 kg) (09/14 1850)  CBC:  Recent Labs Lab 01/23/17 1803 01/23/17 1809 01/24/17 0604  WBC 5.7  --  5.6  NEUTROABS 1.8  --  3.0  HGB 12.2* 13.9 10.9*  HCT 37.9* 41.0 33.9*  MCV 89.2  --  87.1  PLT 158  --  142*    Basic Metabolic Panel:  Recent Labs Lab 01/23/17 0042 01/23/17 1803 01/23/17 1809 01/24/17 0604  NA 138 139 141 138  K 3.8 4.4 4.4 3.6  CL 106 102 102 109  CO2 23 28  --  23  GLUCOSE 93 107* 106* 79  BUN 19 22* 28* 19  CREATININE 1.20 1.65* 1.50* 1.15  CALCIUM 8.4* 9.4  --  8.5*  MG 2.0  --   --   --     Lipid Panel:    Component Value Date/Time   CHOL 135 01/24/2017 0604   TRIG 93 01/24/2017 0604   HDL 48 01/24/2017 0604   CHOLHDL 2.8 01/24/2017 0604   VLDL 19 01/24/2017 0604   LDLCALC 68 01/24/2017 0604   HgbA1c:  Lab Results  Component Value Date   HGBA1C 5.7 (H)  01/24/2017   Urine Drug Screen: No results found for: LABOPIA, COCAINSCRNUR, LABBENZ, AMPHETMU, THCU, LABBARB  Alcohol Level No results found for: ETH  IMAGING  Ct Angio Head W Or Wo Contrast Ct Angio Neck W And/or Wo Contrast 01/23/2017 IMPRESSION:  Atherosclerotic disease at both carotid bifurcations. No stenosis on the left. 25% stenosis on the right. Advanced atherosclerotic disease throughout both carotid siphon regions. Stenosis estimated at 50% in both siphon regions. No flow demonstrated in the superior division right M2 branches, consistent with the hyperdense vessel seen at head CT. The patient has had old infarction in the right  frontal lobe, but I would assume today's findings or acute. Proximal right vertebral artery stenosis, maximal at 50% 1 cm beyond the vessel origin. Nonvisualization of the distal right vertebral artery which appears to be thrombosed or occluded by embolic disease. The left vertebral artery is the dominant vessel and is widely patent to the basilar. The exact age of the right vertebral artery occlusion cannot be establish with certainty.    Ct Head Wo Contrast 01/23/2017 IMPRESSION:  1. Minimal contrast staining adjacent to the M1 segment of the right middle cerebral artery status post intervention. No intraparenchymal hematoma or extra-axial collection.  2. Old right frontal lobe infarct.    Dg Chest Port 1 View 01/23/2017 IMPRESSION:  Tube positions as described without pneumothorax. Postoperative change on the left. Bibasilar atelectasis. No edema or consolidation. Heart mildly enlarged. There is aortic atherosclerosis. Aortic Atherosclerosis    Ct Head Code Stroke Wo Contrast 01/23/2017   IMPRESSION:  1. No acute hemorrhage or mass lesion.  2. Hyperdense appearance of the right middle cerebral artery M2/M3 segments may indicate acute thrombus, particularly in the setting of left-sided weakness. Correlation with CTA recommended.  3. Old right frontal infarct.  4. ASPECTS is 10.     Transthoracic Echocardiogram - pending     PHYSICAL EXAM  Vitals:   01/24/17 0530 01/24/17 0545 01/24/17 0600 01/24/17 0630  BP: 119/67 128/66 (!) 146/62 121/61  Pulse: 72 65 60 (!) 49  Resp: 12 12 12 12   Temp:      TempSrc:      SpO2: 100% 100% 100% 100%  Weight:       Frail elderly malnourished looking male who is sedated and intubated. . Afebrile. Head is nontraumatic. Neck is supple without bruit.    Cardiac exam no murmur or gallop. Lungs are clear to auscultation. Distal pulses are well felt.  Neurological Exam :  Sedated intubated unresponsive.eye is partially open. Follows occasional  midline and right-sided commands only. No gaze deviation. Pupils small reactive. Fundi not visualized. Does not blink to threat on either side.No facial weakness. Tongue midline. Moves right upper extremity purposefully against gravity. Left upper extremity is flaccid with no response to painful stimuli. Withdraws both lower extremities well to painful stimuli. Plantars downgoing. Gait not tested.   ASSESSMENT/PLAN Mr. Thatcher Doberstein is a 66 y.o. male with history of hypertension and recent prostate surgery presenting with left sided weakness and confusion.   tPA Given: Yes - Friday 01/23/17 at 1815 Mechanical thrombectomy (see below)   Stroke:   Right middle cerebral artery branch infarct do to right M1 occlusion treated with IV TPA and mechanical embolectomy and revascularization  Resultant  Left hemiplegia  CT head - Minimal contrast staining adjacent to the M1 segment of the R MCA status post intervention.  MRI head - pending  MRA head - not performed  Carotid Doppler  - CTA  neck  2D Echo - pending  LDL 68  HgbA1c - 5.7  VTE prophylaxis - SCDs  Diet NPO time specified  No antithrombotic prior to admission, now on No antithrombotic S/P TPA  Patient will be counseled to be compliant with his antithrombotic medications  Ongoing aggressive stroke risk factor management  Therapy recommendations: - pending  Disposition: Pending  Hypertension  Stable  Permissive hypertension (OK if < 220/120) but gradually normalize in 5-7 days  Long-term BP goal normotensive  Hyperlipidemia  Home meds:  Zocor 40 mg daily prior to admission  LDL 68, goal < 70  Will resume Zocor  Continue statin at discharge   CEREBRAL ANGIOGRAM S/P RT common carotid arteriogram followed complete revascularization of occluded RT MCA inferior and sup divisions  With x 1 pass with 31mm x 40 mm solitaire FR retrieval device achieving a TICI 3 reperfusion   Other Stroke Risk Factors  Advanced  age  Coronary artery disease    Other Active Problems  Elevated troponin - 0.19 -> cardiology consult  Abnormal ECG - SR rate 75 BPM - LVH - repeat this AM -> sinus bradycardia rate 59 bpm with LVH  Mild anemia  Hospital day # [place] PA-C [facility] Pager [phone] 01/24/2017, 3:37 PM I have personally examined this patient, reviewed notes, independently viewed imaging studies, participated in medical decision making and plan of care.ROS completed by me personally and pertinent positives fully documented  I have made any additions or clarifications directly to the above note. He presented with left hemiplegia due to right M 1 occlusion and was treated with iv TPA and mech embolectomy.continue close neurological monitoring hand strict blood pressure control. Discontinuing groin sheet today and check MRI scan of the brain later. Will consider extubation tomorrow. Long discussion with patient's sisters about plan of care and prognosis and consult questions. Discuss with Dr. Lake Bells and Estanislado Pandy This patient is critically ill and at significant risk of neurological worsening, death and care requires constant monitoring of vital signs, hemodynamics,respiratory and cardiac monitoring, extensive review of multiple databases, frequent neurological assessment, discussion with family, other specialists and medical decision making of high complexity.I have made any additions or clarifications directly to the above note.This critical care time does not reflect procedure time, or teaching time or supervisory time of PA/NP/Med Resident etc but could involve care discussion time.  I spent 40 minutes of neurocritical care time  in the care of  this patient.     Antony Contras, MD Medical Director [facility] Pager: [phone] 01/24/2017 4:57 PM   To contact Stroke Continuity provider, please refer to [email or web address] After hours, contact General Neurology

## 2017-01-24 NOTE — Progress Notes (Signed)
7FR RFA sheath removed at 0945am.  Manual pressure using hemostasis pad for 25 minutes. Hemostasis at 1005.  Groin pulse is very weak, tegaderm dressing applied.  Site reviewed with Financial trader.  Bilateral distal pulses doppler.  No groin complications.

## 2017-01-24 NOTE — Progress Notes (Signed)
PT Cancellation Note  Patient Details Name: Ian Hill MRN: [id number] DOB: 03/20/1947   Cancelled Treatment:    Reason Eval/Treat Not Completed: Patient not medically ready. Pt currently on bedrest. Will await increased activity orders prior to initiating PT eval.    Thelma Comp 01/24/2017, 6:40 AM  Rolinda Roan, PT, DPT Acute Rehabilitation Services Pager: [phone]

## 2017-01-24 NOTE — Progress Notes (Signed)
  Echocardiogram 2D Echocardiogram has been performed.  Ian Hill 01/24/2017, 4:31 PM

## 2017-01-24 NOTE — Progress Notes (Signed)
Pt with multiple runs of Vtach. Per report pt was having similar runs in IR. Warren Lacy MD paged and made aware. BMP and Mag labs ordered.

## 2017-01-25 ENCOUNTER — Inpatient Hospital Stay (HOSPITAL_COMMUNITY): Payer: Medicare Other

## 2017-01-25 ENCOUNTER — Encounter (HOSPITAL_COMMUNITY): Payer: Self-pay | Admitting: Interventional Radiology

## 2017-01-25 DIAGNOSIS — J9601 Acute respiratory failure with hypoxia: Secondary | ICD-10-CM

## 2017-01-25 LAB — GLUCOSE, CAPILLARY
GLUCOSE-CAPILLARY: 67 mg/dL (ref 65–99)
GLUCOSE-CAPILLARY: 62 mg/dL — AB (ref 65–99)
GLUCOSE-CAPILLARY: 104 mg/dL — AB (ref 65–99)
GLUCOSE-CAPILLARY: 61 mg/dL — AB (ref 65–99)
GLUCOSE-CAPILLARY: 114 mg/dL — AB (ref 65–99)
Glucose-Capillary: 124 mg/dL — ABNORMAL HIGH (ref 65–99)
Glucose-Capillary: 181 mg/dL — ABNORMAL HIGH (ref 65–99)
Glucose-Capillary: 60 mg/dL — ABNORMAL LOW (ref 65–99)
Glucose-Capillary: 66 mg/dL (ref 65–99)
Glucose-Capillary: 73 mg/dL (ref 65–99)
Glucose-Capillary: 83 mg/dL (ref 65–99)
Glucose-Capillary: 91 mg/dL (ref 65–99)

## 2017-01-25 LAB — CBC WITH DIFFERENTIAL/PLATELET
BASOS PCT: 0 %
Basophils Absolute: 0 10*3/uL (ref 0.0–0.1)
Eosinophils Absolute: 0.1 10*3/uL (ref 0.0–0.7)
Eosinophils Relative: 1 %
HCT: 34.5 % — ABNORMAL LOW (ref 39.0–52.0)
Hemoglobin: 11.3 g/dL — ABNORMAL LOW (ref 13.0–17.0)
Lymphocytes Relative: 16 %
Lymphs Abs: 1.1 10*3/uL (ref 0.7–4.0)
MCH: 29 pg (ref 26.0–34.0)
MCHC: 32.8 g/dL (ref 30.0–36.0)
MCV: 88.7 fL (ref 78.0–100.0)
MONO ABS: 0.5 10*3/uL (ref 0.1–1.0)
MONOS PCT: 7 %
Neutro Abs: 5.6 10*3/uL (ref 1.7–7.7)
Neutrophils Relative %: 77 %
Platelets: 119 10*3/uL — ABNORMAL LOW (ref 150–400)
RBC: 3.89 MIL/uL — ABNORMAL LOW (ref 4.22–5.81)
RDW: 15.9 % — AB (ref 11.5–15.5)
WBC: 7.2 10*3/uL (ref 4.0–10.5)

## 2017-01-25 LAB — BASIC METABOLIC PANEL
Anion gap: 5 (ref 5–15)
BUN: 15 mg/dL (ref 6–20)
CALCIUM: 8.2 mg/dL — AB (ref 8.9–10.3)
CO2: 22 mmol/L (ref 22–32)
CREATININE: 1 mg/dL (ref 0.61–1.24)
Chloride: 111 mmol/L (ref 101–111)
GFR calc Af Amer: >60 mL/min (ref >60)
GFR calc non Af Amer: >60 mL/min (ref >60)
GLUCOSE: 175 mg/dL — AB (ref 65–99)
Potassium: 3.9 mmol/L (ref 3.5–5.1)
Sodium: 138 mmol/L (ref 135–145)

## 2017-01-25 LAB — TROPONIN I: TROPONIN I: 0.1 ng/mL — AB (ref <0.03)

## 2017-01-25 LAB — PHOSPHORUS: PHOSPHORUS: 3.7 mg/dL (ref 2.5–4.6)

## 2017-01-25 LAB — MAGNESIUM: MAGNESIUM: 2 mg/dL (ref 1.7–2.4)

## 2017-01-25 MED ORDER — ORAL CARE MOUTH RINSE
15.0000 mL | Freq: Two times a day (BID) | OROMUCOSAL | Status: DC
  Administered 2017-01-25 – 2017-01-27 (×7): 15 mL via OROMUCOSAL

## 2017-01-25 MED ORDER — ASPIRIN 300 MG RE SUPP
300.0000 mg | Freq: Every day | RECTAL | Status: DC
  Administered 2017-01-25: 300 mg via RECTAL
  Filled 2017-01-25: qty 1

## 2017-01-25 MED ORDER — FENTANYL CITRATE (PF) 100 MCG/2ML IJ SOLN
12.5000 ug | INTRAMUSCULAR | Status: DC | PRN
  Administered 2017-01-25 – 2017-01-27 (×6): 25 ug via INTRAVENOUS
  Filled 2017-01-25 (×6): qty 2

## 2017-01-25 MED ORDER — ASPIRIN 325 MG PO TABS
325.0000 mg | ORAL_TABLET | Freq: Every day | ORAL | Status: DC
  Administered 2017-01-26 – 2017-01-27 (×2): 325 mg via ORAL
  Filled 2017-01-25 (×2): qty 1

## 2017-01-25 MED ORDER — CHLORHEXIDINE GLUCONATE 0.12 % MT SOLN: 15.0000 mL | Freq: Two times a day (BID) | OROMUCOSAL | Status: DC

## 2017-01-25 MED ORDER — CHLORHEXIDINE GLUCONATE 0.12 % MT SOLN
15.0000 mL | Freq: Two times a day (BID) | OROMUCOSAL | Status: DC
  Administered 2017-01-25 – 2017-01-28 (×6): 15 mL via OROMUCOSAL
  Filled 2017-01-25 (×5): qty 15

## 2017-01-25 MED ORDER — DEXTROSE 50 % IV SOLN
25.0000 mL | Freq: Once | INTRAVENOUS | Status: AC
  Administered 2017-01-25: 25 mL via INTRAVENOUS
  Filled 2017-01-25: qty 50

## 2017-01-25 MED ORDER — HALOPERIDOL LACTATE 5 MG/ML IJ SOLN
1.0000 mg | Freq: Four times a day (QID) | INTRAMUSCULAR | Status: DC | PRN
  Administered 2017-01-25 – 2017-01-26 (×3): 1 mg via INTRAVENOUS
  Filled 2017-01-25 (×3): qty 1

## 2017-01-25 MED ORDER — DEXTROSE 50 % IV SOLN
25.0000 mL | Freq: Once | INTRAVENOUS | Status: AC
  Administered 2017-01-25: 25 mL via INTRAVENOUS

## 2017-01-25 MED ORDER — POLYVINYL ALCOHOL 1.4 % OP SOLN
1.0000 [drp] | OPHTHALMIC | Status: DC | PRN
  Filled 2017-01-25: qty 15

## 2017-01-25 MED ORDER — DEXTROSE 50 % IV SOLN
INTRAVENOUS | Status: AC
Start: 2017-01-25 — End: 2017-01-25
  Administered 2017-01-25: 25 mL via INTRAVENOUS
  Filled 2017-01-25: qty 50

## 2017-01-25 MED ORDER — METOPROLOL TARTRATE 5 MG/5ML IV SOLN
2.5000 mg | Freq: Four times a day (QID) | INTRAVENOUS | Status: DC
  Administered 2017-01-25 – 2017-01-26 (×5): 2.5 mg via INTRAVENOUS
  Filled 2017-01-25 (×4): qty 5

## 2017-01-25 MED ORDER — ORAL CARE MOUTH RINSE: 15.0000 mL | Freq: Two times a day (BID) | OROMUCOSAL | Status: DC

## 2017-01-25 NOTE — Progress Notes (Signed)
OT Cancellation Note  Patient Details Name: Ian Hill MRN: [id number] DOB: Jan 11, 1951   Cancelled Treatment:    Reason Eval/Treat Not Completed: Patient not medically ready. Pt with active bedrest orders. Will await increase in activity orders prior to initiating OT evaluation. Thank you for this referral!  Norman Herrlich, MS OTR/L  Pager: [phone] 01/25/2017, 6:54 AM

## 2017-01-25 NOTE — Progress Notes (Signed)
Hypoglycemic Event  CBG: 61  Treatment: 15 GM carbohydrate snack  Symptoms: None  Follow-up CBG: Time:2000 CBG Result:114  Possible Reasons for Event: Previously NPO    [employer]

## 2017-01-25 NOTE — Progress Notes (Signed)
PULMONARY / CRITICAL CARE MEDICINE   Name: Ian Hill MRN: [id number] DOB: 1950-07-02    ADMISSION DATE:  01/23/2017 CONSULTATION DATE:  01/23/2017  REFERRING MD:  Estanislado Pandy  CHIEF COMPLAINT:  stroke   BRIEF HISTORY OF PRESENT ILLNESS:   66 y/o male with a history of hypertension and recent "prostate surgery" presented with acute R MCA stroke.  Received tpa IV, had persistent symptoms so went for cerebral angiogram with clot retrieval.  PCCM consulted for vent management.    SUBJECTIVE:  MRI last night showed area of stroke in the left brain as well as right Hypoglycemia this morning? Spurious reading  VITAL SIGNS: BP 116/83   Pulse 63   Temp 97.8 F (36.6 C) (Axillary)   Resp 12   Ht 5\' 10"  (1.778 m) Comment: Estimated w/ tape measure while Reverse Trndl  Wt 56 kg (123 lb 7.3 oz)   SpO2 100%   BMI 17.71 kg/m   HEMODYNAMICS:    VENTILATOR SETTINGS: Vent Mode: PRVC FiO2 (%):  [40 %] 40 % Set Rate:  [12 bmp] 12 bmp Vt Set:  [600 mL] 600 mL PEEP:  [5 cmH20] 5 cmH20 Plateau Pressure:  [18 cmH20-20 cmH20] 18 cmH20  INTAKE / OUTPUT: I/O last 3 completed shifts: In: 3632.6 [I.V.:3632.6] Out: 2000 [FBPZW:2585; Blood:50]  PHYSICAL EXAMINATION:  General:  In bed on vent HENT: NCAT ETT in place PULM: CTA B, vent supported breathing CV: RRR, no mgr GI: BS+, soft, nontender MSK: normal bulk and tone Neuro: awake, following commands      LABS:  BMET  Recent Labs Lab 01/23/17 1803 01/23/17 1809 01/24/17 0604 01/25/17 0023  NA 139 141 138 138  K 4.4 4.4 3.6 3.9  CL 102 102 109 111  CO2 28  --  23 22  BUN 22* 28* 19 15  CREATININE 1.65* 1.50* 1.15 1.00  GLUCOSE 107* 106* 79 175*    Electrolytes  Recent Labs Lab 01/23/17 1803 01/24/17 0604 01/24/17 1043 01/24/17 1553 01/25/17 0023  CALCIUM 9.4 8.5*  --   --  8.2*  MG  --   --  2.1 2.1 2.0  PHOS  --   --  3.0 4.0 3.7    CBC  Recent Labs Lab 01/23/17 1803 01/23/17 1809  01/24/17 0604 01/25/17 0023  WBC 5.7  --  5.6 7.2  HGB 12.2* 13.9 10.9* 11.3*  HCT 37.9* 41.0 33.9* 34.5*  PLT 158  --  142* 119*    Coag's  Recent Labs Lab 01/23/17 1803  APTT 35  INR 1.04    Sepsis Markers No results for input(s): LATICACIDVEN, PROCALCITON, O2SATVEN in the last 168 hours.  ABG  Recent Labs Lab 01/23/17 2359  PHART 7.385  PCO2ART 41.3  PO2ART 248*    Liver Enzymes  Recent Labs Lab 01/23/17 1803  AST 48*  ALT 42  ALKPHOS 86  BILITOT 0.8  ALBUMIN 3.9    Cardiac Enzymes  Recent Labs Lab 01/24/17 1043 01/24/17 1553 01/25/17 0023  TROPONINI 0.18* 0.14* 0.10*    Glucose  Recent Labs Lab 01/24/17 2342 01/25/17 0022 01/25/17 0324 01/25/17 0653 01/25/17 0803 01/25/17 0805  GLUCAP 68 181* 83 16 22* 11*    Imaging Mr Brain Wo Contrast  Result Date: 01/24/2017 CLINICAL DATA:  Right MCA stroke EXAM: MRI HEAD WITHOUT CONTRAST TECHNIQUE: Multiplanar, multiecho pulse sequences of the brain and surrounding structures were obtained without intravenous contrast. COMPARISON:  Head CT 01/23/2017 FINDINGS: Brain: There is cortical diffusion restriction throughout the posterior right  MCA territory, in keeping with recent acute right MCA infarct. There is no significant mass effect or acute hemorrhage. There is mild cytotoxic edema in the affected distribution. Additionally, there are small foci of diffusion restriction within the superior right frontal lobe (series 3, image 48) and within the left parietal lobe (series 3, image 37). There is confluent periventricular T2 hyperintensity consistent with chronic hypertensive microangiopathy. There is encephalomalacia in the right frontal lobe at the site of an old infarct. No intraparenchymal hematoma or chronic microhemorrhage. The dura is normal and there is no extra-axial collection. Vascular: Major intracranial arterial and venous sinus flow voids are preserved. Skull and upper cervical spine: The  visualized skull base, calvarium, upper cervical spine and extracranial soft tissues are normal. Sinuses/Orbits: Fluid within the sphenoid sinuses. Bilateral maxillary sinus mucosal thickening. Normal orbits. IMPRESSION: 1. Cortical diffusion restriction throughout the posterior right MCA territory, consistent with acute infarct. No hemorrhage or mass effect. 2. Punctate foci of acute ischemia within the superior right frontal lobe and within the left occipital lobe. Distribution within multiple vascular territories suggests origin of thrombus from a central source within the heart or ascending aorta. 3. Old right frontal lobe infarct and findings of chronic hypertensive microangiopathy. Electronically Signed   By: Ulyses Jarred M.D.   On: 01/24/2017 19:10   Dg Chest Port 1 View  Result Date: 01/25/2017 CLINICAL DATA:  Acute respiratory failure EXAM: PORTABLE CHEST 1 VIEW COMPARISON:  January 23, 2017 FINDINGS: Study is limited due to patient rotation. The ETT is in good position. The OG tube terminates below today's film. No pneumothorax. Elevation of the left hemidiaphragm and left-sided postoperative changes are noted. There is a small right effusion. No focal infiltrates or overt edema. IMPRESSION: 1. Support apparatus as above. 2. Small right pleural effusion. 3. No other acute abnormalities identified. Electronically Signed   By: Dorise Bullion III M.D   On: 01/25/2017 07:20   Dg Abd Portable 1v  Result Date: 01/24/2017 CLINICAL DATA:  Orogastric placement EXAM: PORTABLE ABDOMEN - 1 VIEW COMPARISON:  None. FINDINGS: Orogastric tube enters the stomach and is coiled with its tip in the fundus. Gas pattern is unremarkable. Urinary tract contrast following vascular procedure. IMPRESSION: Orogastric tube tip coiled in the fundus. Electronically Signed   By: Nelson Chimes M.D.   On: 01/24/2017 09:02     STUDIES:  9/14 CTHead> hyperdense R MCA 9/14 CT angio head/neck> occluded R M2 branch, appearance  of old R frontal stroke 9/15 MRI Brain > old R frontal infarct, new R posterior MCA stroke, also left occipital and R frontal punctate infarcts 9/15 Echo> LVH, hypertrophic cardiomyopathy, severe LAE/RAE  CULTURES: none  ANTIBIOTICS: 9/15 cefazolin x1  SIGNIFICANT EVENTS: 9/14 admit, R MCA M2 clot retrieval post IV TPA  LINES/TUBES: 9/14 ETT  DISCUSSION: 66 y/o male with hypertension, "prostate problems" here with acute R MCA territory stroke requiring neuro IR intervention.  After admission has positive troponin and ectopy.  ASSESSMENT / PLAN:  NEUROLOGIC A:   Acute R MCA stroke, left occipital, R frontal as well> most likely embolic source Old R frontal stroke P:   RASS goal: 0  Stop sedation Post neuro orderset and secondary stroke pervention per neuro/stroke team Needs clot work up, possible anticoagulation, defer to stroke team  PULMONARY A: Acute respiratory failure with hypoxemia due to inability to protect airway > resolved Emphysema seen on CXR  P:   Extubate today Monitor O2 saturation SLP evaluation post extubation  CARDIOVASCULAR A:  Hypertension History of hypertensive cardiomyopathy Elevated troponin  EKG: LVH, some junctional beats Ectopy P:  Tele Start metoprolol Consider TEE vs bubble study and LE doppler to assess for source of emboli ASA  RENAL A:   No acute issues P:   Monitor BMET and UOP Replace electrolytes as needed  GASTROINTESTINAL A:   No acute issues P:   SLP evaluation post evaluation  HEMATOLOGIC A:   No acute issues P:  Monitor for bleeding  INFECTIOUS A:   No acute issues P:   Monitor for fever  ENDOCRINE A:   Hyperglycemia, Hgb A1c 5.7   P:   Continue SSI   FAMILY  - Updates: sisters updated bedside on 9/16  My cc time 31 minutes  Roselie Awkward, MD [place] [facility] Pager: [phone] Cell: [phone] After 3pm or if no response, call [phone]   01/25/2017, 8:15 AM

## 2017-01-25 NOTE — Progress Notes (Signed)
Referring Physician(s): Leonie Man Code Stroke  Supervising Physician: Luanne Bras  Patient Status:  [facility] - In-pt  Chief Complaint:  Rt MCA Stroke  Subjective:  Mr Ian Hill has been extubated.  [name] agitated and moving all over the bed. He keeps saying "I'm cold"   Allergies: Patient has no allergy information on record.  Medications: Prior to Admission medications   Medication Sig Start Date End Date Taking? Authorizing Provider  atenolol (TENORMIN) 25 MG tablet Take 25 mg by mouth every morning.   Yes [provider]  cephALEXin (KEFLEX) 500 MG capsule Take 500 mg by mouth 2 (two) times daily.    Yes [provider]  finasteride (PROSCAR) 5 MG tablet Take 5 mg by mouth daily.   Yes [provider]  hydrochlorothiazide (HYDRODIURIL) 25 MG tablet Take 25 mg by mouth daily.   Yes [provider]  metoprolol succinate (TOPROL-XL) 25 MG 24 hr tablet Take 75 mg by mouth daily.   Yes [provider]  Nutritional Supplements (ENSURE PO) Take 237 mLs by mouth 2 (two) times daily between meals.    Yes [provider]  polyethylene glycol (MIRALAX / GLYCOLAX) packet Take 17 g by mouth daily as needed for mild constipation.    Yes [provider]  sennosides-docusate sodium (SENOKOT-S) 8.6-50 MG tablet Take 2 tablets by mouth daily.    Yes [provider]  simvastatin (ZOCOR) 40 MG tablet Take 40 mg by mouth every morning.   Yes [provider]  tamsulosin (FLOMAX) 0.4 MG CAPS capsule Take 0.4 mg by mouth.   Yes [provider]     Vital Signs: BP 130/82   Pulse 80   Temp 97.8 F (36.6 C) (Axillary)   Resp 18   Ht 5\' 10"  (1.778 m) Comment: Estimated w/ tape measure while Reverse Trndl  Wt 123 lb 7.3 oz (56 kg)   SpO2 100%   BMI 17.71 kg/m   Physical Exam Awake Confused, mumbling, stating he is cold. Moves all Fours. Right groin stick ok, no hematoma.  Imaging: Ct Angio Head W  Or Wo Contrast  Result Date: 01/23/2017 CLINICAL DATA:  Left-sided weakness. Hyperdense right middle cerebral artery. EXAM: CT ANGIOGRAPHY HEAD AND NECK TECHNIQUE: Multidetector CT imaging of the head and neck was performed using the standard protocol during bolus administration of intravenous contrast. Multiplanar CT image reconstructions and MIPs were obtained to evaluate the vascular anatomy. Carotid stenosis measurements (when applicable) are obtained utilizing NASCET criteria, using the distal internal carotid diameter as the denominator. CONTRAST:  50 cc Isovue 370 COMPARISON:  Head CT earlier same day FINDINGS: CTA NECK FINDINGS Aortic arch: Aortic atherosclerosis. No dissection. Branching pattern of the brachiocephalic vessels from the arch is normal. No origin stenosis. Right carotid system: Common carotid artery widely patent to the bifurcation region. Atherosclerotic disease at the carotid bifurcation and proximal ICA. Minimal diameter of the ICA is 3 mm. Compared to a more distal cervical ICA diameter of 4 mm, this indicates a 25% stenosis. Left carotid system: Common carotid artery widely patent to the bifurcation region. Atherosclerotic disease at the carotid bifurcation and ICA bulb. No narrowing of the ICA bulb beyond that of the more distal cervical ICA and therefore no stenosis. Vertebral arteries: Left vertebral artery is dominant. 20% stenosis at the left vertebral artery origin. Beyond that, the left vertebral artery is widely patent through the cervical region. Right vertebral artery is non dominant. 30% stenosis at the right vertebral artery  origin. 50% stenosis of the right vertebral artery 1 cm beyond its origin. Beyond that, the vessel is patent through the cervical region. Flow is not well demonstrated in the distal vessel. See below. Skeleton: Cervical spondylosis.  No advanced disease. Other neck: No mass or lymphadenopathy. Upper chest: Previous left chest surgery. No active disease  presently. Review of the MIP images confirms the above findings CTA HEAD FINDINGS Anterior circulation: Both internal carotid arteries are patent through the skullbase. There is siphon atherosclerotic disease with peripheral calcification. Stenosis estimated at 50% in both carotid siphon regions. The left anterior and middle cerebral vessels are patent. On the right, the anterior cerebral artery is patent. There is an embolus in an the superior division M2 branch on the right with diminished branch vessel visualization. Posterior circulation: Dominant left vertebral artery widely patent to the basilar. Non dominant right vertebral artery shows diminished did any flow within the intracranial portion, suggesting thrombosis or embolic occlusion. The age of this is indeterminate. No basilar stenosis. Superior cerebellar and posterior cerebral arteries are patent. Venous sinuses: Patent and normal Anatomic variants: None significant Delayed phase: Not performed Review of the MIP images confirms the above findings IMPRESSION: Atherosclerotic disease at both carotid bifurcations. No stenosis on the left. 25% stenosis on the right. Advanced atherosclerotic disease throughout both carotid siphon regions. Stenosis estimated at 50% in both siphon regions. No flow demonstrated in the superior division right M2 branches, consistent with the hyperdense vessel seen at head CT. The patient has had old infarction in the right frontal lobe, but I would assume today's findings or acute. Proximal right vertebral artery stenosis, maximal at 50% 1 cm beyond the vessel origin. Nonvisualization of the distal right vertebral artery which appears to be thrombosed or occluded by embolic disease. The left vertebral artery is the dominant vessel and is widely patent to the basilar. The exact age of the right vertebral artery occlusion cannot be establish with certainty. Page placed at 1900 hours Dr. Cheral Marker. Electronically Signed   By: Nelson Chimes M.D.   On: 01/23/2017 19:08   Ct Head Wo Contrast  Result Date: 01/23/2017 CLINICAL DATA:  Status post stroke intervention. Left-sided weakness. EXAM: CT HEAD WITHOUT CONTRAST TECHNIQUE: Contiguous axial images were obtained from the base of the skull through the vertex without intravenous contrast. COMPARISON:  Head CT and CTA 01/23/2017 FINDINGS: Brain: There is an old right frontal lobe infarct. No intraparenchymal hematoma. No midline shift or other mass effect. No extra-axial collection. Vascular: There is persistent contrast enhancement of the proximal intracranial arteries, greatest in the right middle cerebral artery. There is trace parenchymal/subarachnoid contrast staining adjacent to the right M1 segment. Skull: Normal. Negative for fracture or focal lesion. Sinuses/Orbits: Small amount of fluid in left maxillary sinus. Other: None IMPRESSION: 1. Minimal contrast staining adjacent to the M1 segment of the right middle cerebral artery status post intervention. No intraparenchymal hematoma or extra-axial collection. 2. Old right frontal lobe infarct. Electronically Signed   By: Ulyses Jarred M.D.   On: 01/23/2017 22:12   Ct Angio Neck W And/or Wo Contrast  Result Date: 01/23/2017 CLINICAL DATA:  Left-sided weakness. Hyperdense right middle cerebral artery. EXAM: CT ANGIOGRAPHY HEAD AND NECK TECHNIQUE: Multidetector CT imaging of the head and neck was performed using the standard protocol during bolus administration of intravenous contrast. Multiplanar CT image reconstructions and MIPs were obtained to evaluate the vascular anatomy. Carotid stenosis measurements (when applicable) are obtained utilizing NASCET criteria, using the distal internal  carotid diameter as the denominator. CONTRAST:  50 cc Isovue 370 COMPARISON:  Head CT earlier same day FINDINGS: CTA NECK FINDINGS Aortic arch: Aortic atherosclerosis. No dissection. Branching pattern of the brachiocephalic vessels from the arch is  normal. No origin stenosis. Right carotid system: Common carotid artery widely patent to the bifurcation region. Atherosclerotic disease at the carotid bifurcation and proximal ICA. Minimal diameter of the ICA is 3 mm. Compared to a more distal cervical ICA diameter of 4 mm, this indicates a 25% stenosis. Left carotid system: Common carotid artery widely patent to the bifurcation region. Atherosclerotic disease at the carotid bifurcation and ICA bulb. No narrowing of the ICA bulb beyond that of the more distal cervical ICA and therefore no stenosis. Vertebral arteries: Left vertebral artery is dominant. 20% stenosis at the left vertebral artery origin. Beyond that, the left vertebral artery is widely patent through the cervical region. Right vertebral artery is non dominant. 30% stenosis at the right vertebral artery origin. 50% stenosis of the right vertebral artery 1 cm beyond its origin. Beyond that, the vessel is patent through the cervical region. Flow is not well demonstrated in the distal vessel. See below. Skeleton: Cervical spondylosis.  No advanced disease. Other neck: No mass or lymphadenopathy. Upper chest: Previous left chest surgery. No active disease presently. Review of the MIP images confirms the above findings CTA HEAD FINDINGS Anterior circulation: Both internal carotid arteries are patent through the skullbase. There is siphon atherosclerotic disease with peripheral calcification. Stenosis estimated at 50% in both carotid siphon regions. The left anterior and middle cerebral vessels are patent. On the right, the anterior cerebral artery is patent. There is an embolus in an the superior division M2 branch on the right with diminished branch vessel visualization. Posterior circulation: Dominant left vertebral artery widely patent to the basilar. Non dominant right vertebral artery shows diminished did any flow within the intracranial portion, suggesting thrombosis or embolic occlusion. The age of  this is indeterminate. No basilar stenosis. Superior cerebellar and posterior cerebral arteries are patent. Venous sinuses: Patent and normal Anatomic variants: None significant Delayed phase: Not performed Review of the MIP images confirms the above findings IMPRESSION: Atherosclerotic disease at both carotid bifurcations. No stenosis on the left. 25% stenosis on the right. Advanced atherosclerotic disease throughout both carotid siphon regions. Stenosis estimated at 50% in both siphon regions. No flow demonstrated in the superior division right M2 branches, consistent with the hyperdense vessel seen at head CT. The patient has had old infarction in the right frontal lobe, but I would assume today's findings or acute. Proximal right vertebral artery stenosis, maximal at 50% 1 cm beyond the vessel origin. Nonvisualization of the distal right vertebral artery which appears to be thrombosed or occluded by embolic disease. The left vertebral artery is the dominant vessel and is widely patent to the basilar. The exact age of the right vertebral artery occlusion cannot be establish with certainty. Page placed at 1900 hours Dr. Cheral Marker. Electronically Signed   By: Nelson Chimes M.D.   On: 01/23/2017 19:08   Mr Brain Wo Contrast  Result Date: 01/24/2017 CLINICAL DATA:  Right MCA stroke EXAM: MRI HEAD WITHOUT CONTRAST TECHNIQUE: Multiplanar, multiecho pulse sequences of the brain and surrounding structures were obtained without intravenous contrast. COMPARISON:  Head CT 01/23/2017 FINDINGS: Brain: There is cortical diffusion restriction throughout the posterior right MCA territory, in keeping with recent acute right MCA infarct. There is no significant mass effect or acute hemorrhage. There is mild cytotoxic  edema in the affected distribution. Additionally, there are small foci of diffusion restriction within the superior right frontal lobe (series 3, image 48) and within the left parietal lobe (series 3, image 37).  There is confluent periventricular T2 hyperintensity consistent with chronic hypertensive microangiopathy. There is encephalomalacia in the right frontal lobe at the site of an old infarct. No intraparenchymal hematoma or chronic microhemorrhage. The dura is normal and there is no extra-axial collection. Vascular: Major intracranial arterial and venous sinus flow voids are preserved. Skull and upper cervical spine: The visualized skull base, calvarium, upper cervical spine and extracranial soft tissues are normal. Sinuses/Orbits: Fluid within the sphenoid sinuses. Bilateral maxillary sinus mucosal thickening. Normal orbits. IMPRESSION: 1. Cortical diffusion restriction throughout the posterior right MCA territory, consistent with acute infarct. No hemorrhage or mass effect. 2. Punctate foci of acute ischemia within the superior right frontal lobe and within the left occipital lobe. Distribution within multiple vascular territories suggests origin of thrombus from a central source within the heart or ascending aorta. 3. Old right frontal lobe infarct and findings of chronic hypertensive microangiopathy. Electronically Signed   By: Ulyses Jarred M.D.   On: 01/24/2017 19:10   Dg Chest Port 1 View  Result Date: 01/25/2017 CLINICAL DATA:  Acute respiratory failure EXAM: PORTABLE CHEST 1 VIEW COMPARISON:  January 23, 2017 FINDINGS: Study is limited due to patient rotation. The ETT is in good position. The OG tube terminates below today's film. No pneumothorax. Elevation of the left hemidiaphragm and left-sided postoperative changes are noted. There is a small right effusion. No focal infiltrates or overt edema. IMPRESSION: 1. Support apparatus as above. 2. Small right pleural effusion. 3. No other acute abnormalities identified. Electronically Signed   By: Dorise Bullion III M.D   On: 01/25/2017 07:20   Dg Chest Port 1 View  Result Date: 01/23/2017 CLINICAL DATA:  Hypoxia EXAM: PORTABLE CHEST 1 VIEW COMPARISON:   None. FINDINGS: Endotracheal tube tip is 6.3 cm above carina. Nasogastric tube tip and side port below the diaphragm. No pneumothorax. There is patchy bibasilar atelectasis. There is postoperative change on the left. There is no edema or consolidation. Heart is mildly enlarged with pulmonary vascularity within normal limits. No evident adenopathy. No bone lesions. There is aortic atherosclerosis IMPRESSION: Tube positions as described without pneumothorax. Postoperative change on the left. Bibasilar atelectasis. No edema or consolidation. Heart mildly enlarged. There is aortic atherosclerosis. Aortic Atherosclerosis (ICD10-I70.0). Electronically Signed   By: Lowella Grip III M.D.   On: 01/23/2017 22:49   Dg Abd Portable 1v  Result Date: 01/24/2017 CLINICAL DATA:  Orogastric placement EXAM: PORTABLE ABDOMEN - 1 VIEW COMPARISON:  None. FINDINGS: Orogastric tube enters the stomach and is coiled with its tip in the fundus. Gas pattern is unremarkable. Urinary tract contrast following vascular procedure. IMPRESSION: Orogastric tube tip coiled in the fundus. Electronically Signed   By: Nelson Chimes M.D.   On: 01/24/2017 09:02   Ct Head Code Stroke Wo Contrast  Addendum Date: 01/23/2017   ADDENDUM REPORT: 01/23/2017 18:40 ADDENDUM: These results were called by telephone at the time of interpretation on 01/23/2017 at 6:35 pm to Dr. Kerney Elbe , who verbally acknowledged these results. Electronically Signed   By: Ulyses Jarred M.D.   On: 01/23/2017 18:40   Result Date: 01/23/2017 CLINICAL DATA:  Code stroke.  Left-sided weakness EXAM: CT HEAD WITHOUT CONTRAST TECHNIQUE: Contiguous axial images were obtained from the base of the skull through the vertex without intravenous contrast. COMPARISON:  None. FINDINGS: Brain: No mass lesion or acute hemorrhage. No focal hypoattenuation of the basal ganglia or cortex to indicate acutely infarcted tissue. Old right frontal lobe infarct with encephalomalacia. There is  multifocal periventricular leukoaraiosis, most commonly seen in the setting of chronic hypertensive microangiopathy. Vascular: There is hyperdensity within the right MCA insular and opercular segments. There is carotid atherosclerosis at the skullbase. Skull: Normal visualized skull base, calvarium and extracranial soft tissues. Sinuses/Orbits: No sinus fluid levels or advanced mucosal thickening. No mastoid effusion. Normal orbits. ASPECTS [facility] Stroke Program Early CT Score) - Ganglionic level infarction (caudate, lentiform nuclei, internal capsule, insula, M1-M3 cortex): 7 - Supraganglionic infarction (M4-M6 cortex): 3 Total score (0-10 with 10 being normal): 10 IMPRESSION: 1. No acute hemorrhage or mass lesion. 2. Hyperdense appearance of the right middle cerebral artery M2/M3 segments may indicate acute thrombus, particularly in the setting of left-sided weakness. Correlation with CTA recommended. 3. Old right frontal infarct. 4. ASPECTS is 10. Dr.  Kerney Elbe was paged at 6:20 PM. Electronically Signed: By: Ulyses Jarred M.D. On: 01/23/2017 18:22    Labs:  CBC:  Recent Labs  01/23/17 1803 01/23/17 1809 01/24/17 0604 01/25/17 0023  WBC 5.7  --  5.6 7.2  HGB 12.2* 13.9 10.9* 11.3*  HCT 37.9* 41.0 33.9* 34.5*  PLT 158  --  142* 119*    COAGS:  Recent Labs  01/23/17 1803  INR 1.04  APTT 35    BMP:  Recent Labs  01/23/17 0042 01/23/17 1803 01/23/17 1809 01/24/17 0604 01/25/17 0023  NA 138 139 141 138 138  K 3.8 4.4 4.4 3.6 3.9  CL 106 102 102 109 111  CO2 23 28  --  23 22  GLUCOSE 93 107* 106* 79 175*  BUN 19 22* 28* 19 15  CALCIUM 8.4* 9.4  --  8.5* 8.2*  CREATININE 1.20 1.65* 1.50* 1.15 1.00  GFRNONAA 59* 40*  --  >60 >60  GFRAA >60 47*  --  >60 >60    LIVER FUNCTION TESTS:  Recent Labs  01/23/17 1803  BILITOT 0.8  AST 48*  ALT 42  ALKPHOS 86  PROT 6.8  ALBUMIN 3.9    Assessment and Plan:  Rt MCA stroke  S/P RT common carotid arteriogram  followed complete revascularization of occluded RT MCA inferior and sup divisions with x 1 pass with 76mm x 40 mm solitaire FR retrieval device achieving a TICI 3 reperfusion by Dr. Estanislado Pandy.  Continue current care.  Electronically Signed: Murrell Redden, PA-C 01/25/2017, 10:58 AM   I spent a total of 15 Minutes at the the patient's bedside AND on the patient's hospital floor or unit, greater than 50% of which was counseling/coordinating care for f/u after revascularization by Deveshwar.

## 2017-01-25 NOTE — Progress Notes (Signed)
Hypoglycemic Event  CBG: 68  Treatment: 81ml D50  Symptoms: None  Follow-up CBG: Time:0022 CBG Result:181  Possible Reasons for Event: unkown      Cruzita Lederer

## 2017-01-25 NOTE — Anesthesia Postprocedure Evaluation (Signed)
Anesthesia Post Note  Patient: Ian Hill  Procedure(s) Performed: Procedure(s) (LRB): RADIOLOGY WITH ANESTHESIA (N/A)     Patient location during evaluation: ICU Anesthesia Type: General Level of consciousness: sedated Pain management: pain level controlled Vital Signs Assessment: post-procedure vital signs reviewed and stable Respiratory status: patient remains intubated per anesthesia plan Cardiovascular status: stable Postop Assessment: no apparent nausea or vomiting Anesthetic complications: no    Last Vitals:  Vitals:   01/25/17 1000 01/25/17 1100  BP: 130/82 115/72  Pulse: 80 80  Resp: 18 (!) 22  Temp:    SpO2: 100% 100%    Last Pain:  Vitals:   01/25/17 0751  TempSrc: Axillary                 Ian Hill

## 2017-01-25 NOTE — Progress Notes (Signed)
STROKE TEAM PROGRESS NOTE   HISTORY OF PRESENT ILLNESS (per record) Ian Hill is an 66 y.o. male who presents via EMS after wife heard him fall in the bathroom and then noted him to be confused and weak on his left side. Code Stroke was called on arrival to the ED.   CT head: 1. No acute hemorrhage or mass lesion. 2. Hyperdense appearance of the right middle cerebral artery M2/M3 segments may indicate acute thrombus, particularly in the setting of left-sided weakness. Correlation with CTA recommended. 3. Old right frontal infarct. 4. ASPECTS is 10.   CTA head and neck:  -Atherosclerotic disease at both carotid bifurcations. No stenosis on the left. 25% stenosis on the right. -Advanced atherosclerotic disease throughout both carotid siphon regions. Stenosis estimated at 50% in both siphon regions. -No flow demonstrated in the superior division right M2 branches, consistent with the hyperdense vessel seen at head CT. The patient has had old infarction in the right frontal lobe, but I would assume today's findings or acute. -Proximal right vertebral artery stenosis, maximal at 50% 1 cm beyond the vessel origin. Nonvisualization of the distal right vertebral artery which appears to be thrombosed or occluded by embolic disease. The left vertebral artery is the dominant vessel and is widely patent to the basilar. The exact age of the right vertebral artery occlusion cannot be establish with certainty.   LSN: 5:20 PM NIHSS = 19 tPA Given: Yes - Friday 01/23/17 at 1815  CEREBRAL ANGIOGRAM S/P RT common carotid arteriogram followed complete revascularization of occluded RT MCA inferior and sup divisions  With x 1 pass with 62mm x 40 mm solitaire FR retrieval device achieving a TICI 3 reperfusion.   SUBJECTIVE (INTERVAL HISTORY) The patient was extubated this am. There are multiple family members at the bedside including 2 sisters and neice.BP adequately controlled. Appreciate  cardiology consult. MRI scan of the brain shows posterior division right MCA infarct with smaller tiny infarcts in right frontal and left occipital white matter   OBJECTIVE Temp:  [97.4 F (36.3 C)-98.1 F (36.7 C)] 97.8 F (36.6 C) (09/16 0751) Pulse Rate:  [51-78] 63 (09/16 0630) Resp:  [12-24] 12 (09/16 0630) BP: (94-144)/(49-84) 116/83 (09/16 0630) SpO2:  [91 %-100 %] 100 % (09/16 0630) Arterial Line BP: (103-149)/(51-86) 144/61 (09/15 2245) FiO2 (%):  [40 %] 40 % (09/16 0417) Weight:  [93 lb 12.8 oz (42.5 kg)-123 lb 7.3 oz (56 kg)] 123 lb 7.3 oz (56 kg) (09/16 0515)  CBC:   Recent Labs Lab 01/24/17 0604 01/25/17 0023  WBC 5.6 7.2  NEUTROABS 3.0 5.6  HGB 10.9* 11.3*  HCT 33.9* 34.5*  MCV 87.1 88.7  PLT 142* 119*    Basic Metabolic Panel:  Recent Labs Lab 01/24/17 0604  01/24/17 1553 01/25/17 0023  NA 138  --   --  138  K 3.6  --   --  3.9  CL 109  --   --  111  CO2 23  --   --  22  GLUCOSE 79  --   --  175*  BUN 19  --   --  15  CREATININE 1.15  --   --  1.00  CALCIUM 8.5*  --   --  8.2*  MG  --   < > 2.1 2.0  PHOS  --   < > 4.0 3.7  < > = values in this interval not displayed.  Lipid Panel:     Component Value Date/Time   CHOL  135 01/24/2017 0604   TRIG 93 01/24/2017 0604   HDL 48 01/24/2017 0604   CHOLHDL 2.8 01/24/2017 0604   VLDL 19 01/24/2017 0604   LDLCALC 68 01/24/2017 0604   HgbA1c:  Lab Results  Component Value Date   HGBA1C 5.7 (H) 01/24/2017   Urine Drug Screen:     Component Value Date/Time   LABOPIA NONE DETECTED 01/24/2017 1043   COCAINSCRNUR NONE DETECTED 01/24/2017 1043   LABBENZ NONE DETECTED 01/24/2017 1043   AMPHETMU NONE DETECTED 01/24/2017 1043   THCU NONE DETECTED 01/24/2017 1043   LABBARB NONE DETECTED 01/24/2017 1043    Alcohol Level No results found for: ETH  IMAGING  Ct Angio Head W Or Wo Contrast Ct Angio Neck W And/or Wo Contrast 01/23/2017 IMPRESSION:  Atherosclerotic disease at both carotid bifurcations.  No stenosis on the left. 25% stenosis on the right. Advanced atherosclerotic disease throughout both carotid siphon regions. Stenosis estimated at 50% in both siphon regions. No flow demonstrated in the superior division right M2 branches, consistent with the hyperdense vessel seen at head CT. The patient has had old infarction in the right frontal lobe, but I would assume today's findings or acute. Proximal right vertebral artery stenosis, maximal at 50% 1 cm beyond the vessel origin. Nonvisualization of the distal right vertebral artery which appears to be thrombosed or occluded by embolic disease. The left vertebral artery is the dominant vessel and is widely patent to the basilar. The exact age of the right vertebral artery occlusion cannot be establish with certainty.    Ct Head Wo Contrast 01/23/2017 IMPRESSION:  1. Minimal contrast staining adjacent to the M1 segment of the right middle cerebral artery status post intervention. No intraparenchymal hematoma or extra-axial collection.  2. Old right frontal lobe infarct.    Dg Chest Port 1 View 01/23/2017 IMPRESSION:  Tube positions as described without pneumothorax. Postoperative change on the left. Bibasilar atelectasis. No edema or consolidation. Heart mildly enlarged. There is aortic atherosclerosis. Aortic Atherosclerosis    Ct Head Code Stroke Wo Contrast 01/23/2017   IMPRESSION:  1. No acute hemorrhage or mass lesion.  2. Hyperdense appearance of the right middle cerebral artery M2/M3 segments may indicate acute thrombus, particularly in the setting of left-sided weakness. Correlation with CTA recommended.  3. Old right frontal infarct.  4. ASPECTS is 10.     Transthoracic Echocardiogram - Left ventricle: The cavity size was normal. There was hypertrophy   of the apex. The appearance was consistent with apical variant   hypertrophic cardiomyopathy. Systolic function was normal. The   estimated ejection fraction was in the range  of 55% to 60%. There   was no dynamic obstruction. Wall motion was normal; there were no   regional wall motion abnormalities     PHYSICAL EXAM  Vitals:   01/25/17 0600 01/25/17 0615 01/25/17 0630 01/25/17 0751  BP: 105/68  116/83   Pulse: (!) 56 70 63   Resp: 12 12 12    Temp:    97.8 F (36.6 C)  TempSrc:    Axillary  SpO2: 100% 100% 100%   Weight:      Height:       Frail elderly malnourished looking male who is not in distress. . Afebrile. Head is nontraumatic. Neck is supple without bruit.    Cardiac exam no murmur or gallop. Lungs are clear to auscultation. Distal pulses are well felt.  Neurological Exam :   Awake and alert speech is nonfluent slightly hesitant but clear.Marland Kitchen  Follows commands quite well.. Right gaze preference but able to look to the left on command. Left homonymous hemianopsia.. Pupils small reactive. Fundi not visualized. Does not blink to threat on either side.mild left lower facial weakness. Tongue midline. Moves all 4 extremities against gravity but mild left hemiparesis 4/5  with weakness of left grip and intrinsic hand muscles and left hip flexors. Plantars downgoing. Gait not tested.   ASSESSMENT/PLAN Ian Hill is a 66 y.o. male with history of hypertension and recent prostate surgery presenting with left sided weakness and confusion.   tPA Given: Yes - Friday 01/23/17 at 1815 Mechanical thrombectomy (see below)   Stroke:   Right middle cerebral artery branch infarct do to right M1 occlusion treated with IV TPA and mechanical embolectomy and revascularization  Resultant  Left hemiplegia  CT head - Minimal contrast staining adjacent to the M1 segment of the R MCA status post intervention. MRI head -1. Cortical diffusion restriction throughout the posterior right MCA territory, consistent with acute infarct. No hemorrhage or mass effect. 2. Punctate foci of acute ischemia within the superior right frontal lobe and within the left occipital  lobe. Distribution within multiple vascular territories suggests origin of thrombus from a central source within the heart or ascending aorta. 3. Old right frontal lobe infarct and findings of chronic  hypertensive microangiopathy.MRA head - not performed  Carotid Doppler  - CTA neck 2D Echo - Left ventricle: The cavity size was normal. There was hypertrophy   of the apex. The appearance was consistent with apical variant   hypertrophic cardiomyopathy. Systolic function was normal. The   estimated ejection fraction was in the range of 55% to 60%. There   was no dynamic obstruction. Wall motion was normal; there were no    regional wall motion abnormalities  LDL 68  HgbA1c - 5.7  VTE prophylaxis - SCDs Diet NPO time specified  No antithrombotic prior to admission, now on No antithrombotic S/P TPA  Patient will be counseled to be compliant with his antithrombotic medications  Ongoing aggressive stroke risk factor management  Therapy recommendations: - pending  Disposition: Pending  Hypertension  Stable  Permissive hypertension (OK if < 220/120) but gradually normalize in 5-7 days  Long-term BP goal normotensive  Hyperlipidemia  Home meds:  Zocor 40 mg daily prior to admission  LDL 68, goal < 70  Will resume Zocor  Continue statin at discharge   CEREBRAL ANGIOGRAM S/P RT common carotid arteriogram followed complete revascularization of occluded RT MCA inferior and sup divisions  With x 1 pass with 50mm x 40 mm solitaire FR retrieval device achieving a TICI 3 reperfusion   Other Stroke Risk Factors  Advanced age  Coronary artery disease    Other Active Problems  Elevated troponin - 0.19 -> cardiology consult  Abnormal ECG - SR rate 75 BPM - LVH - repeat this AM -> sinus bradycardia rate 59 bpm with LVH  Mild anemia  Hospital day # 2  Mikey Bussing PA-C [facility] Pager [phone] 01/25/2017, 8:21 AM    I have personally  examined this patient, reviewed notes, independently viewed imaging studies, participated in medical decision making and plan of care.ROS completed by me personally and pertinent positives fully documented  I have made any additions or clarifications directly to the above note. He presented with left hemiplegia due to right M 1 occlusion and was treated with iv TPA and mech embolectomy.continue close neurological monitoring hand strict blood pressure control. Discontinuing groin  sheet today and check MRI scan of the brain later. Will consider extubation tomorrow. Long discussion with patient's sisters about plan of care and prognosis and answered questions. Recommend TEE and loop recorder. Mobilize out of bed. Swallow eval. Therapy consults. Likely need inpatient rehabilitation. Discussed with Dr. Lake Bells . This patient is critically ill and at significant risk of neurological worsening, death and care requires constant monitoring of vital signs, hemodynamics,respiratory and cardiac monitoring, extensive review of multiple databases, frequent neurological assessment, discussion with family, other specialists and medical decision making of high complexity.I have made any additions or clarifications directly to the above note.This critical care time does not reflect procedure time, or teaching time or supervisory time of PA/NP/Med Resident etc but could involve care discussion time.  I spent 32 minutes of neurocritical care time  in the care of  this patient.    Antony Contras, MD Medical Director [facility] Pager: [phone] 01/25/2017 11:52 AM  To contact Stroke Continuity provider, please refer to [email or web address] After hours, contact General Neurology

## 2017-01-25 NOTE — Progress Notes (Signed)
PT Cancellation Note  Patient Details Name: Ian Hill MRN: [id number] DOB: 05-27-1950   Cancelled Treatment:    Reason Eval/Treat Not Completed: Patient not medically ready. Pt currently on bedrest. Will await increased activity orders prior to initiating PT eval.    Thelma Comp 01/25/2017, 6:53 AM   Rolinda Roan, PT, DPT Acute Rehabilitation Services Pager: [phone]

## 2017-01-26 ENCOUNTER — Encounter (HOSPITAL_COMMUNITY): Payer: Self-pay | Admitting: Interventional Radiology

## 2017-01-26 ENCOUNTER — Inpatient Hospital Stay (HOSPITAL_COMMUNITY): Payer: Medicare Other

## 2017-01-26 DIAGNOSIS — I10 Essential (primary) hypertension: Secondary | ICD-10-CM

## 2017-01-26 DIAGNOSIS — R1310 Dysphagia, unspecified: Secondary | ICD-10-CM

## 2017-01-26 LAB — GLUCOSE, CAPILLARY
GLUCOSE-CAPILLARY: 54 mg/dL — AB (ref 65–99)
GLUCOSE-CAPILLARY: 104 mg/dL — AB (ref 65–99)
GLUCOSE-CAPILLARY: 59 mg/dL — AB (ref 65–99)
GLUCOSE-CAPILLARY: 88 mg/dL (ref 65–99)
GLUCOSE-CAPILLARY: 93 mg/dL (ref 65–99)
GLUCOSE-CAPILLARY: 80 mg/dL (ref 65–99)
Glucose-Capillary: 22 mg/dL — CL (ref 65–99)
Glucose-Capillary: 56 mg/dL — ABNORMAL LOW (ref 65–99)
Glucose-Capillary: 63 mg/dL — ABNORMAL LOW (ref 65–99)
Glucose-Capillary: 66 mg/dL (ref 65–99)
Glucose-Capillary: 71 mg/dL (ref 65–99)

## 2017-01-26 LAB — BASIC METABOLIC PANEL
Anion gap: 8 (ref 5–15)
BUN: 15 mg/dL (ref 6–20)
CHLORIDE: 106 mmol/L (ref 101–111)
CO2: 26 mmol/L (ref 22–32)
CREATININE: 1.2 mg/dL (ref 0.61–1.24)
Calcium: 8.9 mg/dL (ref 8.9–10.3)
GFR calc Af Amer: >60 mL/min (ref >60)
GFR calc non Af Amer: >60 mL/min (ref >60)
GLUCOSE: 106 mg/dL — AB (ref 65–99)
Potassium: 4.2 mmol/L (ref 3.5–5.1)
SODIUM: 140 mmol/L (ref 135–145)

## 2017-01-26 LAB — TROPONIN I: TROPONIN I: 0.07 ng/mL — AB (ref <0.03)

## 2017-01-26 MED ORDER — DEXTROSE 50 % IV SOLN
INTRAVENOUS | Status: AC
  Administered 2017-01-26: 25 mL
  Filled 2017-01-26: qty 50

## 2017-01-26 MED ORDER — DEXTROSE 50 % IV SOLN
25.0000 mL | Freq: Once | INTRAVENOUS | Status: AC
  Administered 2017-01-26: 25 mL via INTRAVENOUS

## 2017-01-26 MED ORDER — HYDROCHLOROTHIAZIDE 25 MG PO TABS
25.0000 mg | ORAL_TABLET | Freq: Every day | ORAL | Status: DC
  Administered 2017-01-26 – 2017-01-28 (×3): 25 mg via ORAL
  Filled 2017-01-26 (×3): qty 1

## 2017-01-26 MED ORDER — DEXTROSE 50 % IV SOLN
25.0000 mL | Freq: Once | INTRAVENOUS | Status: AC
  Administered 2017-01-26: 25 mL via INTRAVENOUS
  Filled 2017-01-26: qty 50

## 2017-01-26 MED ORDER — FINASTERIDE 5 MG PO TABS
5.0000 mg | ORAL_TABLET | Freq: Every day | ORAL | Status: DC
  Administered 2017-01-26 – 2017-01-28 (×3): 5 mg via ORAL
  Filled 2017-01-26 (×3): qty 1

## 2017-01-26 MED ORDER — HYDROMORPHONE HCL 1 MG/ML PO LIQD
1.0000 mg | Freq: Once | ORAL | Status: DC
  Filled 2017-01-26: qty 1

## 2017-01-26 MED ORDER — TAMSULOSIN HCL 0.4 MG PO CAPS
0.4000 mg | ORAL_CAPSULE | Freq: Every day | ORAL | Status: DC
  Administered 2017-01-26 – 2017-01-28 (×3): 0.4 mg via ORAL
  Filled 2017-01-26 (×3): qty 1

## 2017-01-26 MED ORDER — ATENOLOL 25 MG PO TABS
25.0000 mg | ORAL_TABLET | Freq: Every day | ORAL | Status: DC
  Administered 2017-01-27: 25 mg via ORAL
  Filled 2017-01-26: qty 0.5
  Filled 2017-01-26 (×3): qty 1

## 2017-01-26 MED ORDER — HYDROMORPHONE HCL 1 MG/ML IJ SOLN
0.5000 mg | Freq: Once | INTRAMUSCULAR | Status: AC
  Administered 2017-01-26: 0.5 mg via INTRAVENOUS
  Filled 2017-01-26 (×2): qty 0.5

## 2017-01-26 MED ORDER — DEXTROSE 50 % IV SOLN
INTRAVENOUS | Status: AC
  Filled 2017-01-26: qty 50

## 2017-01-26 NOTE — Progress Notes (Signed)
Pt c/o left lower chest/rib/back pain and nausea. Dr. Leonie Man aware, requested that nursing staff page cardiology (following pt). Awaiting call back from Team A cardiology PA. VSS, NAD.

## 2017-01-26 NOTE — Progress Notes (Signed)
PULMONARY / CRITICAL CARE MEDICINE   Name: Ian Hill MRN: [id number] DOB: Jul 18, 1950    ADMISSION DATE:  01/23/2017 CONSULTATION DATE:  01/23/2017  REFERRING MD:  Estanislado Pandy  CHIEF COMPLAINT:  stroke   BRIEF HISTORY OF PRESENT ILLNESS:   66 y/o male with a history of hypertension and recent "prostate surgery" presented with acute R MCA stroke.  Received tpa IV, had persistent symptoms so went for cerebral angiogram with clot retrieval.  PCCM consulted for vent management.    SUBJECTIVE:  No events overnight, no acute changes  VITAL SIGNS: BP 108/67   Pulse 68   Temp 99 F (37.2 C) (Axillary)   Resp (!) 21   Ht 5\' 10"  (1.778 m) Comment: Estimated w/ tape measure while Reverse Trndl  Wt 56 kg (123 lb 7.3 oz)   SpO2 100%   BMI 17.71 kg/m   HEMODYNAMICS:    VENTILATOR SETTINGS:    INTAKE / OUTPUT: I/O last 3 completed shifts: In: 3332.5 [P.O.:920; I.V.:1565.8; NG/GT:846.7] Out: 2900 [Urine:2900]  PHYSICAL EXAMINATION:  General:  In bed, NAD HENT: [place]/AT, PERRL, EOM-I and MMM PULM: CTA bilaterally CV: RRR, Nl S1/S2, -M/R/G. GI: Soft, NT, ND and +BS MSK: WNL Neuro: Awake, following commands  LABS:  BMET  Recent Labs Lab 01/24/17 0604 01/25/17 0023 01/26/17 0201  NA 138 138 140  K 3.6 3.9 4.2  CL 109 111 106  CO2 23 22 26   BUN 19 15 15   CREATININE 1.15 1.00 1.20  GLUCOSE 79 175* 106*   Electrolytes  Recent Labs Lab 01/24/17 0604 01/24/17 1043 01/24/17 1553 01/25/17 0023 01/26/17 0201  CALCIUM 8.5*  --   --  8.2* 8.9  MG  --  2.1 2.1 2.0  --   PHOS  --  3.0 4.0 3.7  --    CBC  Recent Labs Lab 01/23/17 1803 01/23/17 1809 01/24/17 0604 01/25/17 0023  WBC 5.7  --  5.6 7.2  HGB 12.2* 13.9 10.9* 11.3*  HCT 37.9* 41.0 33.9* 34.5*  PLT 158  --  142* 119*   Coag's  Recent Labs Lab 01/23/17 1803  APTT 35  INR 1.04   Sepsis Markers No results for input(s): LATICACIDVEN, PROCALCITON, O2SATVEN in the last 168  hours.  ABG  Recent Labs Lab 01/23/17 2359  PHART 7.385  PCO2ART 41.3  PO2ART 248*    Liver Enzymes  Recent Labs Lab 01/23/17 1803  AST 48*  ALT 42  ALKPHOS 86  BILITOT 0.8  ALBUMIN 3.9    Cardiac Enzymes  Recent Labs Lab 01/24/17 1043 01/24/17 1553 01/25/17 0023  TROPONINI 0.18* 0.14* 0.10*    Glucose  Recent Labs Lab 01/25/17 2000 01/25/17 2318 01/26/17 0318 01/26/17 0321 01/26/17 0407 01/26/17 0903  GLUCAP 114* 91 54* 56* 104* 59*    Imaging No results found.   STUDIES:  9/14 CTHead> hyperdense R MCA 9/14 CT angio head/neck> occluded R M2 branch, appearance of old R frontal stroke 9/15 MRI Brain > old R frontal infarct, new R posterior MCA stroke, also left occipital and R frontal punctate infarcts 9/15 Echo> LVH, hypertrophic cardiomyopathy, severe LAE/RAE  CULTURES: none  ANTIBIOTICS: 9/15 cefazolin x1  SIGNIFICANT EVENTS: 9/14 admit, R MCA M2 clot retrieval post IV TPA  LINES/TUBES: 9/14 ETT>>>9/17  I reviewed CXR myself, no acute disease noted, small pleural effusion noted.  DISCUSSION: 66 y/o male with hypertension, "prostate problems" here with acute R MCA territory stroke requiring neuro IR intervention.  After admission has positive troponin and ectopy.  Discussed with PCCM-NP.  ASSESSMENT / PLAN:  NEUROLOGIC A:   Acute R MCA stroke, left occipital, R frontal as well> most likely embolic source Old R frontal stroke P:   D/C all sedation Post neuro orderset and secondary stroke pervention per neuro/stroke team Needs clot work up, possible anticoagulation, defer to stroke team  PULMONARY A: Acute respiratory failure with hypoxemia due to inability to protect airway > resolved Emphysema seen on CXR  P:   Extubate today Monitor O2 saturation SLP evaluation post extubation  CARDIOVASCULAR A:  Hypertension History of hypertensive cardiomyopathy Elevated troponin  EKG: LVH, some junctional beats Ectopy P:   Tele Continue metoprolol Consider TEE vs bubble study and LE doppler to assess for source of emboli, will defer to neuro ASA  RENAL A:   No acute issues P:   BMET in AM Monitor BMET and UOP Replace electrolytes as indicated  GASTROINTESTINAL A:   No acute issues P:   SLP per CVA protocol  HEMATOLOGIC A:   No acute issues P:  Monitor for bleeding  INFECTIOUS A:   No acute issues P:   Monitor for fever  ENDOCRINE A:   Hyperglycemia, Hgb A1c 5.7   P:   Continue SSI Once ready for diet then insure heart healthy carb modified diet  FAMILY  - Updates: No family bedside 9/17.  PCCM will sign off, please call back if needed.  Rush Farmer, M.D. [facility] Pulmonary/Critical Care Medicine. Pager: [phone]. After hours pager: [phone].  01/26/2017, 10:35 AM

## 2017-01-26 NOTE — Evaluation (Signed)
Speech Language Pathology Evaluation Patient Details Name: Ian Hill MRN: [id number] DOB: Aug 30, 1950 Today's Date: 01/26/2017 Time: 4825-0037 SLP Time Calculation (min) (ACUTE ONLY): 8 min  Problem List:  Patient Active Problem List   Diagnosis Date Noted  . Protein-calorie malnutrition, severe 01/24/2017  . Acute right arterial ischemic stroke, middle cerebral artery (MCA) ([place]) 01/23/2017  . Acute respiratory failure ([place]) 01/23/2017  . Essential hypertension 01/23/2017  . Acute metabolic encephalopathy 04/88/8916  . Endotracheal tube present   . Cerebrovascular accident (CVA) due to occlusion of right middle cerebral artery ([place])   . Stage 3 chronic kidney disease    Past Medical History:  Past Medical History:  Diagnosis Date  . Abnormal ECG   . Hypertensive heart disease    Past Surgical History:  Past Surgical History:  Procedure Laterality Date  . RADIOLOGY WITH ANESTHESIA N/A 01/23/2017   Procedure: RADIOLOGY WITH ANESTHESIA;  Surgeon: Luanne Bras, MD;  Location: [facility];  Service: Radiology;  Laterality: N/A;   HPI:  66 y/o male with a history of hypertension and recent "prostate surgery" presented with acute R MCA stroke. Received tpa IV, had persistent symptoms so went for cerebral angiogram with clot retrieval. MRI shows punctate foci of acute ischemia within the superior right frontal lobe and within the left occipital lobe and old right frontal lobe infarct and findings of chronic hypertensive microangiopathy.   Assessment / Plan / Recommendation Clinical Impression  Pt demonstrates speech and language within functional limits given his baseline intellectual disability reported by sister. Pt has appropriate support at home for ADLs and demonstrates ability to request wants and needs. No SLP f/u needed. Will sign off.    SLP Assessment  SLP Recommendation/Assessment: Patient needs continued Speech Lanaguage Pathology Services SLP Visit Diagnosis:  Frontal lobe and executive function deficit Frontal lobe and executive function deficit following: Cerebral infarction    Follow Up Recommendations  None    Frequency and Duration           SLP Evaluation Cognition  Overall Cognitive Status: History of cognitive impairments - at baseline Arousal/Alertness: Awake/alert Orientation Level: Oriented to place;Oriented to person Attention: Focused Focused Attention: Appears intact Behaviors: Restless Safety/Judgment: Impaired       Comprehension  Auditory Comprehension Overall Auditory Comprehension: Appears within functional limits for tasks assessed Commands: Within Functional Limits Conversation: Simple    Expression Expression Primary Mode of Expression: Verbal Verbal Expression Overall Verbal Expression: Appears within functional limits for tasks assessed Automatic Speech: Counting Level of Generative/Spontaneous Verbalization: Conversation Naming: No impairment   Oral / Motor  Oral Motor/Sensory Function Overall Oral Motor/Sensory Function: Within functional limits Motor Speech Overall Motor Speech: Appears within functional limits for tasks assessed   GO                    Aaron Edelman, Student SLP 01/26/2017, 11:32 AM

## 2017-01-26 NOTE — Progress Notes (Addendum)
STROKE TEAM PROGRESS NOTE   HISTORY OF PRESENT ILLNESS (per record) Ian Hill is an 66 y.o. male who presents via EMS after wife heard him fall in the bathroom and then noted him to be confused and weak on his left side. Code Stroke was called on arrival to the ED.   CT head: 1. No acute hemorrhage or mass lesion. 2. Hyperdense appearance of the right middle cerebral artery M2/M3 segments may indicate acute thrombus, particularly in the setting of left-sided weakness. Correlation with CTA recommended. 3. Old right frontal infarct. 4. ASPECTS is 10.   CTA head and neck:  -Atherosclerotic disease at both carotid bifurcations. No stenosis on the left. 25% stenosis on the right. -Advanced atherosclerotic disease throughout both carotid siphon regions. Stenosis estimated at 50% in both siphon regions. -No flow demonstrated in the superior division right M2 branches, consistent with the hyperdense vessel seen at head CT. The patient has had old infarction in the right frontal lobe, but I would assume today's findings or acute. -Proximal right vertebral artery stenosis, maximal at 50% 1 cm beyond the vessel origin. Nonvisualization of the distal right vertebral artery which appears to be thrombosed or occluded by embolic disease. The left vertebral artery is the dominant vessel and is widely patent to the basilar. The exact age of the right vertebral artery occlusion cannot be establish with certainty.   LSN: 5:20 PM NIHSS = 19 tPA Given: Yes - Friday 01/23/17 at 1815  CEREBRAL ANGIOGRAM S/P RT common carotid arteriogram followed complete revascularization of occluded RT MCA inferior and sup divisions  With x 1 pass with 73mm x 40 mm solitaire FR retrieval device achieving a TICI 3 reperfusion.   SUBJECTIVE (INTERVAL HISTORY) The patient  Is c/o right side chest wall pain and states he had fallen when he had his stroke. appreciate cardiology help. Denies chest pain. No famly  at bedside today  OBJECTIVE Temp:  [98.2 F (36.8 C)-99 F (37.2 C)] 99 F (37.2 C) (09/17 0800) Pulse Rate:  [61-118] 68 (09/17 0700) Cardiac Rhythm: Atrial fibrillation (09/17 0800) Resp:  [6-30] 21 (09/17 0700) BP: (94-122)/(49-89) 108/67 (09/17 0700) SpO2:  [81 %-100 %] 100 % (09/17 0700)  CBC:   Recent Labs Lab 01/24/17 0604 01/25/17 0023  WBC 5.6 7.2  NEUTROABS 3.0 5.6  HGB 10.9* 11.3*  HCT 33.9* 34.5*  MCV 87.1 88.7  PLT 142* 119*    Basic Metabolic Panel:   Recent Labs Lab 01/24/17 1553 01/25/17 0023 01/26/17 0201  NA  --  138 140  K  --  3.9 4.2  CL  --  111 106  CO2  --  22 26  GLUCOSE  --  175* 106*  BUN  --  15 15  CREATININE  --  1.00 1.20  CALCIUM  --  8.2* 8.9  MG 2.1 2.0  --   PHOS 4.0 3.7  --     Lipid Panel:     Component Value Date/Time   CHOL 135 01/24/2017 0604   TRIG 93 01/24/2017 0604   HDL 48 01/24/2017 0604   CHOLHDL 2.8 01/24/2017 0604   VLDL 19 01/24/2017 0604   LDLCALC 68 01/24/2017 0604   HgbA1c:  Lab Results  Component Value Date   HGBA1C 5.7 (H) 01/24/2017   Urine Drug Screen:     Component Value Date/Time   LABOPIA NONE DETECTED 01/24/2017 1043   COCAINSCRNUR NONE DETECTED 01/24/2017 1043   LABBENZ NONE DETECTED 01/24/2017 1043   AMPHETMU NONE  DETECTED 01/24/2017 1043   THCU NONE DETECTED 01/24/2017 1043   LABBARB NONE DETECTED 01/24/2017 1043    Alcohol Level No results found for: ETH  IMAGING  Ct Angio Head W Or Wo Contrast Ct Angio Neck W And/or Wo Contrast 01/23/2017 IMPRESSION:  Atherosclerotic disease at both carotid bifurcations. No stenosis on the left. 25% stenosis on the right. Advanced atherosclerotic disease throughout both carotid siphon regions. Stenosis estimated at 50% in both siphon regions. No flow demonstrated in the superior division right M2 branches, consistent with the hyperdense vessel seen at head CT. The patient has had old infarction in the right frontal lobe, but I would assume  today's findings or acute. Proximal right vertebral artery stenosis, maximal at 50% 1 cm beyond the vessel origin. Nonvisualization of the distal right vertebral artery which appears to be thrombosed or occluded by embolic disease. The left vertebral artery is the dominant vessel and is widely patent to the basilar. The exact age of the right vertebral artery occlusion cannot be establish with certainty.    Ct Head Wo Contrast 01/23/2017 IMPRESSION:  1. Minimal contrast staining adjacent to the M1 segment of the right middle cerebral artery status post intervention. No intraparenchymal hematoma or extra-axial collection.  2. Old right frontal lobe infarct.    Dg Chest Port 1 View 01/23/2017 IMPRESSION:  Tube positions as described without pneumothorax. Postoperative change on the left. Bibasilar atelectasis. No edema or consolidation. Heart mildly enlarged. There is aortic atherosclerosis. Aortic Atherosclerosis    Ct Head Code Stroke Wo Contrast 01/23/2017   IMPRESSION:  1. No acute hemorrhage or mass lesion.  2. Hyperdense appearance of the right middle cerebral artery M2/M3 segments may indicate acute thrombus, particularly in the setting of left-sided weakness. Correlation with CTA recommended.  3. Old right frontal infarct.  4. ASPECTS is 10.     Transthoracic Echocardiogram - Left ventricle: The cavity size was normal. There was hypertrophy   of the apex. The appearance was consistent with apical variant   hypertrophic cardiomyopathy. Systolic function was normal. The   estimated ejection fraction was in the range of 55% to 60%. There   was no dynamic obstruction. Wall motion was normal; there were no   regional wall motion abnormalities     PHYSICAL EXAM  Vitals:   01/26/17 0500 01/26/17 0600 01/26/17 0700 01/26/17 0800  BP: 105/62 (!) 99/57 108/67   Pulse: 73  68   Resp: 19 (!) 26 (!) 21   Temp:    99 F (37.2 C)  TempSrc:    Axillary  SpO2: 97%  100%   Weight:       Height:       Frail elderly malnourished looking male who is not in distress. . Afebrile. Head is nontraumatic. Neck is supple without bruit.    Cardiac exam no murmur or gallop. Lungs are clear to auscultation. Distal pulses are well felt.  Neurological Exam :   Awake and alert speech is nonfluent slightly hesitant but clear.. Follows commands quite well.. Right gaze preference but able to look to the left on command. Left homonymous hemianopsia.. Pupils small reactive. Fundi not visualized. Does not blink to threat on either side.mild left lower facial weakness. Tongue midline. Moves all 4 extremities against gravity but mild left hemiparesis 4/5  with weakness of left grip and intrinsic hand muscles and left hip flexors. Plantars downgoing. Gait not tested.   ASSESSMENT/PLAN Mr. Nishawn Rotan is a 66 y.o. male with history of hypertension  and recent prostate surgery presenting with left sided weakness and confusion.   tPA Given: Yes - Friday 01/23/17 at 1815 Mechanical thrombectomy (see below)   Stroke:   Right middle cerebral artery branch infarct do to right M1 occlusion treated with IV TPA and mechanical embolectomy and revascularization  Resultant  Left hemiplegia  CT head - Minimal contrast staining adjacent to the M1 segment of the R MCA status post intervention. MRI head -1. Cortical diffusion restriction throughout the posterior right MCA territory, consistent with acute infarct. No hemorrhage or mass effect. 2. Punctate foci of acute ischemia within the superior right frontal lobe and within the left occipital lobe. Distribution within multiple vascular territories suggests origin of thrombus from a central source within the heart or ascending aorta. 3. Old right frontal lobe infarct and findings of chronic  hypertensive microangiopathy.MRA head - not performed  Carotid Doppler  - CTA neck 2D Echo - Left ventricle: The cavity size was normal. There was hypertrophy   of  the apex. The appearance was consistent with apical variant   hypertrophic cardiomyopathy. Systolic function was normal. The   estimated ejection fraction was in the range of 55% to 60%. There   was no dynamic obstruction. Wall motion was normal; there were no    regional wall motion abnormalities  LDL 68  HgbA1c - 5.7  VTE prophylaxis - SCDs Diet NPO time specified Except for: Sips with Meds  No antithrombotic prior to admission, now on No antithrombotic S/P TPA  Patient will be counseled to be compliant with his antithrombotic medications  Ongoing aggressive stroke risk factor management  Therapy recommendations: - pending  Disposition: Pending  Hypertension  Stable  Permissive hypertension (OK if < 220/120) but gradually normalize in 5-7 days  Long-term BP goal normotensive  Hyperlipidemia  Home meds:  Zocor 40 mg daily prior to admission  LDL 68, goal < 70  Will resume Zocor  Continue statin at discharge   CEREBRAL ANGIOGRAM S/P RT common carotid arteriogram followed complete revascularization of occluded RT MCA inferior and sup divisions  With x 1 pass with 67mm x 40 mm solitaire FR retrieval device achieving a TICI 3 reperfusion   Other Stroke Risk Factors  Advanced age  Coronary artery disease    Other Active Problems  Elevated troponin - 0.19 -> cardiology consult  Abnormal ECG - SR rate 75 BPM - LVH - repeat this AM -> sinus bradycardia rate 59 bpm with LVH  Mild anemia  Hospital day # 3    I have personally examined this patient, reviewed notes, independently viewed imaging studies, participated in medical decision making and plan of care.ROS completed by me personally and pertinent positives fully documented  I have made any additions or clarifications directly to the above note. He presented with left hemiplegia due to right M 1 occlusion and was treated with iv TPA and mech embolectomy.continue close neurological monitoring hand strict  blood pressure control.  .    Recommend TEE and loop recorder. Mobilize out of bed. Swallow eval. Therapy consults. Likely need inpatient rehabilitation. Transfer to floor bed later today. This patient is critically ill and at significant risk of neurological worsening, death and care requires constant monitoring of vital signs, hemodynamics,respiratory and cardiac monitoring, extensive review of multiple databases, frequent neurological assessment, discussion with family, other specialists and medical decision making of high complexity.I have made any additions or clarifications directly to the above note.This critical care time does not reflect procedure time, or teaching  time or supervisory time of PA/NP/Med Resident etc but could involve care discussion time.  I spent 30  minutes of neurocritical care time  in the care of  this patient.    Antony Contras, MD Medical Director [facility] Pager: [phone] 01/26/2017 1:06 PM  To contact Stroke Continuity provider, please refer to [email or web address] After hours, contact General Neurology

## 2017-01-26 NOTE — Progress Notes (Signed)
PT Cancellation Note  Patient Details Name: Ian Hill MRN: [id number] DOB: 1951/04/26   Cancelled Treatment:    Reason Eval/Treat Not Completed: Patient not medically ready. Pt currently on bedrest. Will await increased activity orders prior to initiating PT eval.    Thelma Comp 01/26/2017, 7:08 AM   Rolinda Roan, PT, DPT Acute Rehabilitation Services Pager: [phone]

## 2017-01-26 NOTE — Progress Notes (Signed)
Inpatient Diabetes Program Recommendations  AACE/ADA: New Consensus Statement on Inpatient Glycemic Control (2015)  Target Ranges:  Prepandial:   less than 140 mg/dL      Peak postprandial:   less than 180 mg/dL (1-2 hours)      Critically ill patients:  140 - 180 mg/dL   Lab Results  Component Value Date   GLUCAP 93 01/26/2017   HGBA1C 5.7 (H) 01/24/2017    Review of Glycemic Control Results for CONOR, LATA (MRN [id number]) as of 01/26/2017 14:58  Ref. Range 01/26/2017 04:07 01/26/2017 09:03 01/26/2017 10:38 01/26/2017 11:30 01/26/2017 13:44  Glucose-Capillary Latest Ref Range: 65 - 99 mg/dL 104 (H) 59 (L) 88 63 (L) 93   Inpatient Diabetes Program Recommendations:   Please consider continuing checking CBGs but D/C correction @ this time due to hypoglycemia.  Thank you, Nani Gasser. Lakrista Scaduto, RN, MSN, CDE  Diabetes Coordinator Inpatient Glycemic Control Team Team Pager [phone] (8am-5pm) 01/26/2017 2:59 PM

## 2017-01-26 NOTE — Progress Notes (Signed)
OT Cancellation Note  Patient Details Name: Ian Hill MRN: [id number] DOB: 1950-10-02   Cancelled Treatment:    Reason Eval/Treat Not Completed: Patient not medically ready (bedrest)  Peri Maris  [phone] 01/26/2017, 7:22 AM

## 2017-01-26 NOTE — Progress Notes (Signed)
Hypoglycemic Event  CBG: 56  Treatment: D50 IV 25 mL  Symptoms: None  Follow-up CBG: [id number] CBG Result:104  Possible Reasons for Event: Other: Patient NPO starting midnight 9/17   [facility]

## 2017-01-26 NOTE — Progress Notes (Addendum)
Progress Note  Patient Name: Ian Hill Date of Encounter: 01/26/2017  Primary Cardiologist: Dr. Caryl Comes  Subjective   No chest pain or dyspnea. Main complaint is left sided mid back pain, hurts to touch.   Inpatient Medications    Scheduled Meds: . aspirin  300 mg Rectal Daily   Or  . aspirin  325 mg Oral Daily  . atenolol  25 mg Oral Daily  . chlorhexidine  15 mL Mouth Rinse BID  . finasteride  5 mg Oral Daily  . heparin subcutaneous  5,000 Units Subcutaneous Q12H  . hydrochlorothiazide  25 mg Oral Daily  .  HYDROmorphone (DILAUDID) injection  0.5 mg Intravenous Once  . insulin aspart  0-15 Units Subcutaneous Q4H  . mouth rinse  15 mL Mouth Rinse q12n4p  . metoprolol tartrate  2.5 mg Intravenous Q6H  . simvastatin  40 mg Oral q1800  . tamsulosin  0.4 mg Oral Daily   Continuous Infusions: . sodium chloride 10 mL/hr at 01/25/17 2000   PRN Meds: acetaminophen **OR** acetaminophen (TYLENOL) oral liquid 160 mg/5 mL **OR** acetaminophen, fentaNYL (SUBLIMAZE) injection, haloperidol lactate, ondansetron (ZOFRAN) IV, polyvinyl alcohol   Vital Signs    Vitals:   01/26/17 0500 01/26/17 0600 01/26/17 0700 01/26/17 0800  BP: 105/62 (!) 99/57 108/67   Pulse: 73  68   Resp: 19 (!) 26 (!) 21   Temp:    99 F (37.2 C)  TempSrc:    Axillary  SpO2: 97%  100%   Weight:      Height:        Intake/Output Summary (Last 24 hours) at 01/26/17 1131 Last data filed at 01/26/17 0700  Gross per 24 hour  Intake             1117 ml  Output             1775 ml  Net             -658 ml   Filed Weights   01/23/17 1850 01/24/17 1213 01/25/17 0515  Weight: 124 lb 12.5 oz (56.6 kg) 93 lb 12.8 oz (42.5 kg) 123 lb 7.3 oz (56 kg)    Telemetry    SR with PACs, 8 beat run of NSVT overnight- Personally Reviewed  ECG    SR with PACs and LVH - Personally Reviewed  Physical Exam   GEN: No acute distress.  Thin appearing. Looks older than actual age. Neck: No JVD Cardiac: RRR, no  murmurs, rubs, or gallops.  Respiratory: Clear to auscultation bilaterally. GI: Soft, nontender, non-distended  MS: No edema; No deformity. Neuro:  Nonfocal  Psych: Normal affect   Labs    Chemistry Recent Labs Lab 01/23/17 1803  01/24/17 0604 01/25/17 0023 01/26/17 0201  NA 139  < > 138 138 140  K 4.4  < > 3.6 3.9 4.2  CL 102  < > 109 111 106  CO2 28  --  23 22 26   GLUCOSE 107*  < > 79 175* 106*  BUN 22*  < > 19 15 15   CREATININE 1.65*  < > 1.15 1.00 1.20  CALCIUM 9.4  --  8.5* 8.2* 8.9  PROT 6.8  --   --   --   --   ALBUMIN 3.9  --   --   --   --   AST 48*  --   --   --   --   ALT 42  --   --   --   --  ALKPHOS 86  --   --   --   --   BILITOT 0.8  --   --   --   --   GFRNONAA 40*  --  >60 >60 >60  GFRAA 47*  --  >60 >60 >60  ANIONGAP 9  --  6 5 8   < > = values in this interval not displayed.   Hematology Recent Labs Lab 01/23/17 1803 01/23/17 1809 01/24/17 0604 01/25/17 0023  WBC 5.7  --  5.6 7.2  RBC 4.25  --  3.89* 3.89*  HGB 12.2* 13.9 10.9* 11.3*  HCT 37.9* 41.0 33.9* 34.5*  MCV 89.2  --  87.1 88.7  MCH 28.7  --  28.0 29.0  MCHC 32.2  --  32.2 32.8  RDW 15.4  --  15.3 15.9*  PLT 158  --  142* 119*    Cardiac Enzymes Recent Labs Lab 01/24/17 1043 01/24/17 1553 01/25/17 0023 01/26/17 0952  TROPONINI 0.18* 0.14* 0.10* 0.07*    Recent Labs Lab 01/23/17 1834  TROPIPOC 0.07     BNPNo results for input(s): BNP, PROBNP in the last 168 hours.   DDimer No results for input(s): DDIMER in the last 168 hours.   Radiology    Mr Brain Wo Contrast  Result Date: 01/24/2017 CLINICAL DATA:  Right MCA stroke EXAM: MRI HEAD WITHOUT CONTRAST TECHNIQUE: Multiplanar, multiecho pulse sequences of the brain and surrounding structures were obtained without intravenous contrast. COMPARISON:  Head CT 01/23/2017 FINDINGS: Brain: There is cortical diffusion restriction throughout the posterior right MCA territory, in keeping with recent acute right MCA infarct.  There is no significant mass effect or acute hemorrhage. There is mild cytotoxic edema in the affected distribution. Additionally, there are small foci of diffusion restriction within the superior right frontal lobe (series 3, image 48) and within the left parietal lobe (series 3, image 37). There is confluent periventricular T2 hyperintensity consistent with chronic hypertensive microangiopathy. There is encephalomalacia in the right frontal lobe at the site of an old infarct. No intraparenchymal hematoma or chronic microhemorrhage. The dura is normal and there is no extra-axial collection. Vascular: Major intracranial arterial and venous sinus flow voids are preserved. Skull and upper cervical spine: The visualized skull base, calvarium, upper cervical spine and extracranial soft tissues are normal. Sinuses/Orbits: Fluid within the sphenoid sinuses. Bilateral maxillary sinus mucosal thickening. Normal orbits. IMPRESSION: 1. Cortical diffusion restriction throughout the posterior right MCA territory, consistent with acute infarct. No hemorrhage or mass effect. 2. Punctate foci of acute ischemia within the superior right frontal lobe and within the left occipital lobe. Distribution within multiple vascular territories suggests origin of thrombus from a central source within the heart or ascending aorta. 3. Old right frontal lobe infarct and findings of chronic hypertensive microangiopathy. Electronically Signed   By: Ulyses Jarred M.D.   On: 01/24/2017 19:10   Dg Chest Port 1 View  Result Date: 01/26/2017 CLINICAL DATA:  Fall.  Acute rib pain. EXAM: PORTABLE CHEST 1 VIEW COMPARISON:  Chest x-ray from yesterday FINDINGS: Limited study, reportedly the patient is combative. Extubation of the trachea and esophagus since yesterday. Remote right-sided rib fractures. No noted acute fracture. Postoperative left lung and mediastinum. There is no edema, consolidation, effusion, or pneumothorax. The trachea is deviated to  the left due to volume loss. No thoracic inlet mass on recent CTA. IMPRESSION: Postoperative chest.  No evidence of active disease. Electronically Signed   By: Monte Fantasia M.D.   On: 01/26/2017 11:14  Dg Chest Port 1 View  Result Date: 01/25/2017 CLINICAL DATA:  Acute respiratory failure EXAM: PORTABLE CHEST 1 VIEW COMPARISON:  January 23, 2017 FINDINGS: Study is limited due to patient rotation. The ETT is in good position. The OG tube terminates below today's film. No pneumothorax. Elevation of the left hemidiaphragm and left-sided postoperative changes are noted. There is a small right effusion. No focal infiltrates or overt edema. IMPRESSION: 1. Support apparatus as above. 2. Small right pleural effusion. 3. No other acute abnormalities identified. Electronically Signed   By: Dorise Bullion III M.D   On: 01/25/2017 07:20    Cardiac Studies   2D Echo 01/24/17 Study Conclusions  - Left ventricle: The cavity size was normal. There was hypertrophy   of the apex. The appearance was consistent with apical variant   hypertrophic cardiomyopathy. Systolic function was normal. The   estimated ejection fraction was in the range of 55% to 60%. There   was no dynamic obstruction. Wall motion was normal; there were no   regional wall motion abnormalities. Features are consistent with   a pseudonormal left ventricular filling pattern, with concomitant   abnormal relaxation and increased filling pressure (grade 2   diastolic dysfunction). - Aortic valve: There was mild regurgitation directed centrally in   the LVOT. - Mitral valve: There was mild to moderate regurgitation directed   eccentrically and posteriorly. - Left atrium: The atrium was mildly dilated. - Right atrium: The atrium was mildly dilated. - Tricuspid valve: There was mild-moderate regurgitation directed   centrally. - Pulmonary arteries: Systolic pressure was mildly increased. PA   peak pressure: 36 mm Hg (S).   Patient  Profile     66 y.o. male with h/o HTN who was admitted for acute R MCA stroke s/p TPA followed by surgical revascularization of an occluded right MCA. Cardiology consulted 01/24/17 for + troponin and abnormal EKG. Also with runs of NSVT on telemetry.   Assessment & Plan    1. CVA: s/p TPA and surgical revascularization of an occluded right MCA.   2. Elevated Troponin: Max troponin 0.19 with downward trend. 0.19>>0.18>>0.14>>0.10>>0.07. 2D echo with normal LVEF and normal wall motion. EKG with SR, PACs and LVH with repolarization abnormality. Troponin elevation may have been 2/2 demand ischemia in the setting of acute CVA. He denies CP. We can later consider stress test after he recovers from his recent stroke.   3. Abnormal EKG: EKGs have shown LVH w/ repolarization abnormality. Pt w/ h/o HTN. 2D echo this admission showed normal LVEF at 55-60%, G2DD and normal wall motion. No signs of HCM.   4. NSVT: 8 beat run noted on telemetry overnight. 2D echo with normal LVEF and wall motion. Electrolytes have been stable this admit, however BMP not obtained today. Continue to monitor. Continue BB therapy with atenolol.   5. HTN: controlled on current regimen.   For questions or updates, please contact Lee Please consult [email or web address] for contact info under Cardiology/STEMI.      Signed, Lyda Jester, PA-C  01/26/2017, 11:31 AM    Personally seen and examined. Agree with above.  66 year old with stroke, TPA, MCA occlusion, with mildly elevated flat troponin, normal EF. Brief NSVT - normal EF. On atenolol  When I saw him he was thrashing about.    - agree with plan to pursue stress test at a later date post stroke rehab  - continue with secondary prevention (ASA, statin, Bb)  Will sign off. Please call if ?Marland Kitchen  Candee Furbish, MD

## 2017-01-27 DIAGNOSIS — R414 Neurologic neglect syndrome: Secondary | ICD-10-CM

## 2017-01-27 LAB — GLUCOSE, CAPILLARY
GLUCOSE-CAPILLARY: 139 mg/dL — AB (ref 65–99)
GLUCOSE-CAPILLARY: 87 mg/dL (ref 65–99)
GLUCOSE-CAPILLARY: 90 mg/dL (ref 65–99)
Glucose-Capillary: 48 mg/dL — ABNORMAL LOW (ref 65–99)
Glucose-Capillary: 82 mg/dL (ref 65–99)
Glucose-Capillary: 89 mg/dL (ref 65–99)
Glucose-Capillary: 89 mg/dL (ref 65–99)

## 2017-01-27 MED ORDER — APIXABAN 5 MG PO TABS
5.0000 mg | ORAL_TABLET | Freq: Two times a day (BID) | ORAL | Status: DC
  Administered 2017-01-27 – 2017-01-28 (×2): 5 mg via ORAL
  Filled 2017-01-27 (×2): qty 1

## 2017-01-27 MED ORDER — ATENOLOL 25 MG PO TABS
25.0000 mg | ORAL_TABLET | Freq: Once | ORAL | Status: AC
  Administered 2017-01-27: 25 mg via ORAL
  Filled 2017-01-27: qty 1

## 2017-01-27 MED ORDER — ATENOLOL 25 MG PO TABS
50.0000 mg | ORAL_TABLET | Freq: Every day | ORAL | Status: DC
  Administered 2017-01-28: 50 mg via ORAL
  Filled 2017-01-27: qty 2

## 2017-01-27 NOTE — Progress Notes (Signed)
STROKE TEAM PROGRESS NOTE   HISTORY OF PRESENT ILLNESS (per record) Ian Hill is an 66 y.o. male who presents via EMS after wife heard him fall in the bathroom and then noted him to be confused and weak on his left side. Code Stroke was called on arrival to the ED.   CT head: 1. No acute hemorrhage or mass lesion. 2. Hyperdense appearance of the right middle cerebral artery M2/M3 segments may indicate acute thrombus, particularly in the setting of left-sided weakness. Correlation with CTA recommended. 3. Old right frontal infarct. 4. ASPECTS is 10.   CTA head and neck:  -Atherosclerotic disease at both carotid bifurcations. No stenosis on the left. 25% stenosis on the right. -Advanced atherosclerotic disease throughout both carotid siphon regions. Stenosis estimated at 50% in both siphon regions. -No flow demonstrated in the superior division right M2 branches, consistent with the hyperdense vessel seen at head CT. The patient has had old infarction in the right frontal lobe, but I would assume today's findings or acute. -Proximal right vertebral artery stenosis, maximal at 50% 1 cm beyond the vessel origin. Nonvisualization of the distal right vertebral artery which appears to be thrombosed or occluded by embolic disease. The left vertebral artery is the dominant vessel and is widely patent to the basilar. The exact age of the right vertebral artery occlusion cannot be establish with certainty.   LSN: 5:20 PM NIHSS = 19 tPA Given: Yes - Friday 01/23/17 at 1815  CEREBRAL ANGIOGRAM S/P RT common carotid arteriogram followed complete revascularization of occluded RT MCA inferior and sup divisions  With x 1 pass with 22mm x 40 mm solitaire FR retrieval device achieving a TICI 3 reperfusion.   SUBJECTIVE (INTERVAL HISTORY) The patient  is  Calm and cooperative during my rounds but apparently was agitated earlier today. I have reviewed his telemetry strips and a concern  about A. fib. I have requested cardiology to review telemetry and make a final determination. We will cancel TEE and loop recorder if A. fib is confirmed.    OBJECTIVE Temp:  [98.6 F (37 C)-99 F (37.2 C)] 98.8 F (37.1 C) (09/18 1200) Pulse Rate:  [64-92] 92 (09/18 1200) Cardiac Rhythm: Normal sinus rhythm;Other (Comment) (09/18 1055) Resp:  [16-20] 16 (09/18 0500) BP: (111-128)/(59-86) 123/86 (09/18 1200) SpO2:  [100 %] 100 % (09/18 1200)  CBC:   Recent Labs Lab 01/24/17 0604 01/25/17 0023  WBC 5.6 7.2  NEUTROABS 3.0 5.6  HGB 10.9* 11.3*  HCT 33.9* 34.5*  MCV 87.1 88.7  PLT 142* 119*    Basic Metabolic Panel:   Recent Labs Lab 01/24/17 1553 01/25/17 0023 01/26/17 0201  NA  --  138 140  K  --  3.9 4.2  CL  --  111 106  CO2  --  22 26  GLUCOSE  --  175* 106*  BUN  --  15 15  CREATININE  --  1.00 1.20  CALCIUM  --  8.2* 8.9  MG 2.1 2.0  --   PHOS 4.0 3.7  --     Lipid Panel:     Component Value Date/Time   CHOL 135 01/24/2017 0604   TRIG 93 01/24/2017 0604   HDL 48 01/24/2017 0604   CHOLHDL 2.8 01/24/2017 0604   VLDL 19 01/24/2017 0604   LDLCALC 68 01/24/2017 0604   HgbA1c:  Lab Results  Component Value Date   HGBA1C 5.7 (H) 01/24/2017   Urine Drug Screen:     Component Value Date/Time  LABOPIA NONE DETECTED 01/24/2017 1043   COCAINSCRNUR NONE DETECTED 01/24/2017 1043   LABBENZ NONE DETECTED 01/24/2017 1043   AMPHETMU NONE DETECTED 01/24/2017 1043   THCU NONE DETECTED 01/24/2017 1043   LABBARB NONE DETECTED 01/24/2017 1043    Alcohol Level No results found for: ETH  IMAGING  Ct Angio Head W Or Wo Contrast Ct Angio Neck W And/or Wo Contrast 01/23/2017 IMPRESSION:  Atherosclerotic disease at both carotid bifurcations. No stenosis on the left. 25% stenosis on the right. Advanced atherosclerotic disease throughout both carotid siphon regions. Stenosis estimated at 50% in both siphon regions. No flow demonstrated in the superior division right  M2 branches, consistent with the hyperdense vessel seen at head CT. The patient has had old infarction in the right frontal lobe, but I would assume today's findings or acute. Proximal right vertebral artery stenosis, maximal at 50% 1 cm beyond the vessel origin. Nonvisualization of the distal right vertebral artery which appears to be thrombosed or occluded by embolic disease. The left vertebral artery is the dominant vessel and is widely patent to the basilar. The exact age of the right vertebral artery occlusion cannot be establish with certainty.    Ct Head Wo Contrast 01/23/2017 IMPRESSION:  1. Minimal contrast staining adjacent to the M1 segment of the right middle cerebral artery status post intervention. No intraparenchymal hematoma or extra-axial collection.  2. Old right frontal lobe infarct.    Dg Chest Port 1 View 01/23/2017 IMPRESSION:  Tube positions as described without pneumothorax. Postoperative change on the left. Bibasilar atelectasis. No edema or consolidation. Heart mildly enlarged. There is aortic atherosclerosis. Aortic Atherosclerosis    Ct Head Code Stroke Wo Contrast 01/23/2017   IMPRESSION:  1. No acute hemorrhage or mass lesion.  2. Hyperdense appearance of the right middle cerebral artery M2/M3 segments may indicate acute thrombus, particularly in the setting of left-sided weakness. Correlation with CTA recommended.  3. Old right frontal infarct.  4. ASPECTS is 10.     Transthoracic Echocardiogram - Left ventricle: The cavity size was normal. There was hypertrophy   of the apex. The appearance was consistent with apical variant   hypertrophic cardiomyopathy. Systolic function was normal. The   estimated ejection fraction was in the range of 55% to 60%. There   was no dynamic obstruction. Wall motion was normal; there were no   regional wall motion abnormalities     PHYSICAL EXAM  Vitals:   01/27/17 1100 01/27/17 1107 01/27/17 1110 01/27/17 1200  BP:   123/81 123/81 123/86  Pulse:  68 68 92  Resp:      Temp: 98.8 F (37.1 C)  98.8 F (37.1 C) 98.8 F (37.1 C)  TempSrc:   Oral Oral  SpO2:   100% 100%  Weight:      Height:       Frail elderly malnourished looking male who is not in distress. . Afebrile. Head is nontraumatic. Neck is supple without bruit.    Cardiac exam no murmur or gallop. Lungs are clear to auscultation. Distal pulses are well felt.  Neurological Exam :   Awake and alert speech is nonfluent slightly hesitant but clear.. Follows commands quite well.. Right gaze preference but able to look to the left on command. Left homonymous hemianopsia.. Pupils small reactive. Fundi not visualized. Does not blink to threat on either side.mild left lower facial weakness. Tongue midline. Moves all 4 extremities against gravity but mild left hemiparesis 4/5  with weakness of left grip and intrinsic  hand muscles and left hip flexors. Plantars downgoing. Gait not tested.   ASSESSMENT/PLAN Mr. Demorris Choyce is a 66 y.o. male with history of hypertension and recent prostate surgery presenting with left sided weakness and confusion.   tPA Given: Yes - Friday 01/23/17 at 1815 Mechanical thrombectomy (see below)   Stroke:   Right middle cerebral artery branch infarct do to right M1 occlusion treated with IV TPA and mechanical embolectomy and revascularization likely from paroxysmal atrial fibrillation  Resultant  Left hemiplegia  CT head - Minimal contrast staining adjacent to the M1 segment of the R MCA status post intervention. MRI head -1. Cortical diffusion restriction throughout the posterior right MCA territory, consistent with acute infarct. No hemorrhage or mass effect. 2. Punctate foci of acute ischemia within the superior right frontal lobe and within the left occipital lobe. Distribution within multiple vascular territories suggests origin of thrombus from a central source within the heart or ascending aorta. 3. Old right  frontal lobe infarct and findings of chronic  hypertensive microangiopathy.MRA head - not performed  Carotid Doppler  - CTA neck 2D Echo - Left ventricle: The cavity size was normal. There was hypertrophy   of the apex. The appearance was consistent with apical variant   hypertrophic cardiomyopathy. Systolic function was normal. The   estimated ejection fraction was in the range of 55% to 60%. There   was no dynamic obstruction. Wall motion was normal; there were no    regional wall motion abnormalities  LDL 68  HgbA1c - 5.7  VTE prophylaxis - SCDs Diet Carb Modified Fluid consistency: Thin; Room service appropriate? Yes  No antithrombotic prior to admission, now on No antithrombotic S/P TPA  Patient will be counseled to be compliant with his antithrombotic medications  Ongoing aggressive stroke risk factor management  Therapy recommendations: - pending  Disposition: Pending  Hypertension  Stable  Permissive hypertension (OK if < 220/120) but gradually normalize in 5-7 days  Long-term BP goal normotensive  Hyperlipidemia  Home meds:  Zocor 40 mg daily prior to admission  LDL 68, goal < 70  Will resume Zocor  Continue statin at discharge   CEREBRAL ANGIOGRAM S/P RT common carotid arteriogram followed complete revascularization of occluded RT MCA inferior and sup divisions  With x 1 pass with 63mm x 40 mm solitaire FR retrieval device achieving a TICI 3 reperfusion   Other Stroke Risk Factors  Advanced age  Coronary artery disease    Other Active Problems  Elevated troponin - 0.19 -> cardiology consult  Abnormal ECG - SR rate 75 BPM - LVH - repeat this AM -> sinus bradycardia rate 59 bpm with LVH  Mild anemia  Hospital day # 4    I have personally examined this patient, reviewed notes, independently viewed imaging studies, participated in medical decision making and plan of care.ROS completed by me personally and pertinent positives fully  documented  I have made any additions or clarifications directly to the above note. He presented with left hemiplegia due to right M 1 occlusion and was treated with iv TPA and mech embolectomy.continue close neurological monitoring hand strict blood pressure control.  .     Review of telemetry monitoring strips suggest possible A. fib and will have cardiology confirm this. Likely start him on eliquis if A. fib is confirmed. Continue therapy consults and likely transfer to rehabilitation when bed available. Discussed with electrophysiology nurse practitioner Loreli Dollar. Long discussion with patient and multiple family members and answered questions. Greater than  50% time during this 35 minute visit was spent on counseling and coordination of care about his embolic stroke, possible atrial fibrillation and treatment discussion and answering questions.   Antony Contras, MD Medical Director [facility] Pager: [phone] 01/27/2017 1:25 PM  To contact Stroke Continuity provider, please refer to [email or web address] After hours, contact General Neurology

## 2017-01-27 NOTE — Progress Notes (Signed)
ANTICOAGULATION CONSULT NOTE - Initial Consult  Pharmacy Consult for apixaban Indication: atrial fibrillation  Not on File  Patient Measurements: Height: 5\' 10"  (177.8 cm) (Estimated w/ tape measure while Reverse Trndl) Weight: 123 lb 7.3 oz (56 kg) IBW/kg (Calculated) : 73  Vital Signs: Temp: 98.8 F (37.1 C) (09/18 1200) Temp Source: Oral (09/18 1200) BP: 123/86 (09/18 1200) Pulse Rate: 92 (09/18 1200)  Labs:  Recent Labs  01/24/17 1553 01/25/17 0023 01/26/17 0201 01/26/17 0952  HGB  --  11.3*  --   --   HCT  --  34.5*  --   --   PLT  --  119*  --   --   CREATININE  --  1.00 1.20  --   TROPONINI 0.14* 0.10*  --  0.07*    Estimated Creatinine Clearance: 47.3 mL/min (by C-G formula based on SCr of 1.2 mg/dL).   Medical History: Past Medical History:  Diagnosis Date  . Abnormal ECG   . Hypertensive heart disease     Medications:  Prescriptions Prior to Admission  Medication Sig Dispense Refill Last Dose  . atenolol (TENORMIN) 25 MG tablet Take 25 mg by mouth every morning.   01/23/2017 at 0800  . finasteride (PROSCAR) 5 MG tablet Take 5 mg by mouth daily.   Past Week at Unknown time  . hydrochlorothiazide (HYDRODIURIL) 25 MG tablet Take 25 mg by mouth daily.   Past Week at Unknown time  . simvastatin (ZOCOR) 40 MG tablet Take 40 mg by mouth every morning.   Past Week at Unknown time  . tamsulosin (FLOMAX) 0.4 MG CAPS capsule Take 0.4 mg by mouth daily.    Past Week at Unknown time    Assessment: Ian Hill an 66 y.o.malewho presented with Stroke treated with IV TPA on 9/14 @ 1815 and mechanical embolectomy and revascularization, per medical record likely from paroxysmal atrial fibrillation. Pharmacy has been consulted to start apixaban for AFib.  Pt received last dose of heparin subQ at 1024 this morning. Age 65, Wt 56 kg, SCr 1.2  Goal of Therapy:  Anticoagulation Monitor platelets by anticoagulation protocol: Yes   Plan:  Start apixaban 5 mg  PO twice daily Monitor renal function, CBC, and for signs/symptoms of bleeding   Thank you for allowing [place] to participate in this patients care.  Jens Som, PharmD Clinical phone for 01/27/2017 from 7a-3:30p: [phone] If after 3:30p, please call main pharmacy at: [phone] 01/27/2017 2:00 PM

## 2017-01-27 NOTE — Evaluation (Addendum)
Occupational Therapy Evaluation Patient Details Name: Ian Hill MRN: [id number] DOB: [birth year] Today's Date: 01/27/2017    History of Present Illness 66 y/o male with a history of hypertension and recent "prostate surgery" presented with acute R MCA stroke. Received tpa IV, had persistent symptoms so went for cerebral angiogram with clot retrieval. MRI shows punctate foci of acute ischemia within the superior right frontal lobe and within the left occipital lobe and old right frontal lobe infarct and findings of chronic hypertensive microangiopathy.   Clinical Impression   PTA, pt was living with his sister who performed IADLs. Pt's sister reports that he was able to perform his BADLs without assistance. Pt currently requiring Min A for UB ADLs, Max A for LB ADLs, and Mod A for ADLs in standing due to poor balance, cognition, and coordination. Pt presenting with poor attention, awareness, and sequencing of tasks; pt requiring visual, verbal, and tactile cues for sequencing of tasks. Pt would benefit from further acute OT to facilitate safe dc. Recommend dc to CIR for optimize pt safety and independence with ADLs and functional mobility prior to returning home.     Follow Up Recommendations  CIR;Supervision/Assistance - 24 hour    Equipment Recommendations  Other (comment) (Defer to next venue)    Recommendations for Other Services PT consult     Precautions / Restrictions Precautions Precautions: Other (comment);Fall (Cognitive deficits at baseline) Precaution Comments: Impulsive Restrictions Weight Bearing Restrictions: No      Mobility Bed Mobility Overal bed mobility: Needs Assistance Bed Mobility: Supine to Sit     Supine to sit: Min guard     General bed mobility comments: Min GUard for safety. Bed with HOB and rails lowered  Transfers Overall transfer level: Needs assistance Equipment used: Rolling walker (2 wheeled);1 person hand held assist Transfers: Sit  to/from Stand Sit to Stand: Min assist         General transfer comment: From EOB, pt requiring Min A for safety and used RW to increase safety. During descent to recliner, used single hand held A and requiring Min A for safety    Balance Overall balance assessment: Needs assistance Sitting-balance support: No upper extremity supported;Feet supported Sitting balance-Leahy Scale: Fair Sitting balance - Comments: Pt would lean to L - possibly due to poor awarness and cogntition   Standing balance support: No upper extremity supported;During functional activity Standing balance-Leahy Scale: Fair Standing balance comment: Able to maintain static standing balance. Requires physical A duering functional tasks to maitnain balance                           ADL either performed or assessed with clinical judgement   ADL Overall ADL's : Needs assistance/impaired Eating/Feeding: Minimal assistance;Sitting   Grooming: Moderate assistance;Standing;Cueing for safety;Cueing for sequencing Grooming Details (indicate cue type and reason): Requiring Min-Mod A to maintain standing posture as pt fatigued. Pt requiring cues for sequencing, safety, and to terminate task. Pt with decreased spacial awarness. Upper Body Bathing: Minimal assistance;Sitting   Lower Body Bathing: Maximal assistance;Sit to/from stand   Upper Body Dressing : Minimal assistance;Sitting Upper Body Dressing Details (indicate cue type and reason): Donned new gown like jacket Lower Body Dressing: Maximal assistance;Sit to/from stand   Toilet Transfer: Ambulation;RW;Minimal assistance (Simulated to recliner) Toilet Transfer Details (indicate cue type and reason): Mod A for safety         Functional mobility during ADLs: Moderate assistance;Rolling walker (to sink with rw; to  recliner with single hand held A) General ADL Comments: Pt demonstrating decreased fucntional performance due to poor safety awarness,  cognititon, coorination, grasp strength, and balance. Pt also present with tactile sensativity for sensory input - feel this is possible at baseline.      Vision   Additional Comments: Unsure of visual deficits. Will assess further     Perception     Praxis Praxis Praxis tested?: Deficits Deficits: Organization;Perseveration;Limb apraxia Praxis-Other Comments: Pt dropping grooming items, hitting things at sink, and presenting with poor body awarness.     Pertinent Vitals/Pain Pain Assessment: Faces Faces Pain Scale: Hurts little more Pain Location: Back Pain Descriptors / Indicators: Discomfort Pain Intervention(s): Monitored during session;Repositioned     Hand Dominance  (Unsure)   Extremity/Trunk Assessment Upper Extremity Assessment Upper Extremity Assessment: RUE deficits/detail;LUE deficits/detail RUE Deficits / Details: Good strength in BUEs. Poor coorination and control. Pt would overshoort/undershoot targets during grooming task at sink. Pt demosntrating decreased grasps strength and would drop items throughout task RUE Coordination: decreased fine motor;decreased gross motor LUE Deficits / Details: Good strength in BUEs. Poor coorination and control. Pt would overshoort/undershoot targets during grooming task at sink. Pt demosntrating decreased grasps strength and would drop items throughout task LUE Coordination: decreased fine motor;decreased gross motor   Lower Extremity Assessment Lower Extremity Assessment: Defer to PT evaluation   Cervical / Trunk Assessment Cervical / Trunk Assessment: Kyphotic   Communication Communication Communication: No difficulties;Other (comment) (Cogntitive deficits at baseline)   Cognition Arousal/Alertness: Awake/alert Behavior During Therapy: Restless Overall Cognitive Status: History of cognitive impairments - at baseline Area of Impairment: Orientation;Attention;Memory;Following commands;Safety/judgement;Awareness;Problem  solving                 Orientation Level: Disoriented to;Time Current Attention Level: Sustained Memory: Decreased short-term memory Following Commands: Follows one step commands inconsistently;Follows one step commands with increased time Safety/Judgement: Decreased awareness of safety Awareness: Intellectual Problem Solving: Slow processing;Difficulty sequencing;Requires verbal cues;Requires tactile cues General Comments: Pt presenting with impulsivity, decreased attention, and difficulty follow commans. He requires increase verbal, tactile, and visual cues to follow commands. During grooming task, sister (who is pt's caregiver) would provide verbal instruction for each step including "sqeeze the wash cloth" "wash your face now"   General Comments  Pt two sisters and brother present for session    Exercises     Shoulder Instructions      Home Living Family/patient expects to be discharged to:: Private residence Living Arrangements: Non-relatives/Friends (sister) Available Help at Discharge: Family;Available PRN/intermittently Type of Home: House Home Access: Stairs to enter [employer] of Steps: 6-7 Entrance Stairs-Rails: Right;Left Home Layout: One level     Bathroom Shower/Tub: Tub/shower unit;Door   [employer] Toilet: Standard (higher) Bathroom Accessibility: Yes How Accessible: Accessible via walker Home Equipment: [place] - single point   Additional Comments: Home set up provided by famil;y      Prior Functioning/Environment Level of Independence: Needs assistance  Gait / Transfers Assistance Needed: No use of DME at baseline ADL's / Homemaking Assistance Needed: Sister reports that pt performs his own ADLs. Does not drive or write PTA.             OT Problem List: Decreased range of motion;Decreased activity tolerance;Impaired balance (sitting and/or standing);Decreased coordination;Decreased cognition;Decreased safety awareness;Decreased  knowledge of use of DME or AE;Decreased knowledge of precautions;Pain;Impaired UE functional use      OT Treatment/Interventions: Self-care/ADL training;Therapeutic exercise;Energy conservation;DME and/or AE instruction;Therapeutic activities;Patient/family education    OT Goals(Current goals can  be found in the care plan section) Acute Rehab OT Goals Patient Stated Goal: Unstated OT Goal Formulation: With patient Time For Goal Achievement: 02/10/17 Potential to Achieve Goals: Good ADL Goals Pt Will Perform Grooming: with set-up;with supervision;standing Pt Will Perform Upper Body Dressing: with set-up;with supervision;sitting Pt Will Perform Lower Body Dressing: with set-up;with supervision;sit to/from stand Pt Will Transfer to Toilet: with set-up;with supervision;ambulating;regular height toilet  OT Frequency: Min 2X/week   Barriers to D/C:            Co-evaluation              AM-PAC PT "6 Clicks" Daily Activity     Outcome Measure Help from another person eating meals?: A Little Help from another person taking care of personal grooming?: A Little Help from another person toileting, which includes using toliet, bedpan, or urinal?: A Little Help from another person bathing (including washing, rinsing, drying)?: A Lot Help from another person to put on and taking off regular upper body clothing?: A Little Help from another person to put on and taking off regular lower body clothing?: A Lot 6 Click Score: 16   End of Session Equipment Utilized During Treatment: Gait belt;Rolling walker Nurse Communication: Mobility status;Other (comment) (Decreased safety awarenss)  Activity Tolerance: Patient tolerated treatment well Patient left: in chair;with call bell/phone within reach;with chair alarm set;with family/visitor present  OT Visit Diagnosis: Unsteadiness on feet (R26.81);Other abnormalities of gait and mobility (R26.89);Muscle weakness (generalized) (M62.81);Pain;Other  symptoms and signs involving cognitive function Pain - Right/Left:  (Back) Pain - part of body:  (Back)                Time: 5374-8270 OT Time Calculation (min): 23 min Charges:  OT General Charges $OT Visit: 1 Visit OT Evaluation $OT Eval Moderate Complexity: 1 Mod OT Treatments $Self Care/Home Management : 8-22 mins G-Codes:     [place], OTR/L Acute Rehab Pager: [phone] Office: [place] 01/27/2017, 10:28 AM

## 2017-01-27 NOTE — Progress Notes (Signed)
Rehab Admissions Coordinator Note:  Patient was screened by [name] for appropriateness for an Inpatient Acute Rehab Consult per PT recommendation.   At this time, we are recommending Inpatient Rehab consult.  [name] 01/27/2017, 2:22 PM  I can be reached at [phone].

## 2017-01-27 NOTE — Evaluation (Signed)
Physical Therapy Evaluation Patient Details Name: Ian Hill MRN: [id number] DOB: [birth year] Today's Date: 01/27/2017   History of Present Illness  66 y/o male with a history of hypertension and recent "prostate surgery" presented with acute R MCA stroke. Received tpa IV, had persistent symptoms so went for cerebral angiogram with clot retrieval. MRI shows punctate foci of acute ischemia within the superior right frontal lobe and within the left occipital lobe and old right frontal lobe infarct and findings of chronic hypertensive microangiopathy.  Clinical Impression  Orders received for PT evaluation. Patient demonstrates deficits in functional mobility as indicated below. Will benefit from continued skilled PT to address deficits and maximize function. Will see as indicated and progress as tolerated.  Given current level of deficits, and patients previous ability, feel patient would benefit from comprehensive therapies. Recommend CIR consult at this time.     Follow Up Recommendations CIR;Supervision for mobility/OOB    Equipment Recommendations  Other (comment) (TBD)    Recommendations for Other Services Rehab consult     Precautions / Restrictions Precautions Precautions: Other (comment);Fall (Cognitive deficits at baseline) Precaution Comments: Impulsive Restrictions Weight Bearing Restrictions: No      Mobility  Bed Mobility Overal bed mobility: Needs Assistance Bed Mobility: Supine to Sit     Supine to sit: Min guard     General bed mobility comments: Min GUard for safety. Bed with HOB and rails lowered  Transfers Overall transfer level: Needs assistance Equipment used: Rolling walker (2 wheeled);1 person hand held assist Transfers: Sit to/from Stand Sit to Stand: Min assist         General transfer comment: From EOB, pt requiring Min A for safety and used RW to increase safety. During descent to recliner, used single hand held A and requiring Min A for  safety  Ambulation/Gait Ambulation/Gait assistance: Min assist Ambulation Distance (Feet): 120 Feet Assistive device: Rolling walker (2 wheeled) Gait Pattern/deviations: Step-through pattern;Ataxic;Scissoring;Trunk flexed;Drifts right/left Gait velocity: decreased   General Gait Details: patient with inability to maintain balance during ambulation without assist. easily distracted by environment and poor ability to maintain safety and positioning with assitive device  Stairs            Wheelchair Mobility    Modified Rankin (Stroke Patients Only) Modified Rankin (Stroke Patients Only) Pre-Morbid Rankin Score: Moderate disability Modified Rankin: Moderately severe disability     Balance Overall balance assessment: Needs assistance Sitting-balance support: No upper extremity supported;Feet supported Sitting balance-Leahy Scale: Fair Sitting balance - Comments: Pt would lean to L - possibly due to poor awarness and cogntition   Standing balance support: No upper extremity supported;During functional activity Standing balance-Leahy Scale: Fair Standing balance comment: Reliance on UE support for safety but able to static stand for breif periods without LOB             High level balance activites: Direction changes;Turns;Head turns High Level Balance Comments: moderate assist significant overt LOB with tasks             Pertinent Vitals/Pain Pain Assessment: Faces Faces Pain Scale: Hurts little more Pain Location: left flank pain Pain Descriptors / Indicators: Discomfort Pain Intervention(s): Monitored during session    Home Living Family/patient expects to be discharged to:: Private residence Living Arrangements: Non-relatives/Friends (sister) Available Help at Discharge: Family;Available PRN/intermittently Type of Home: House Home Access: Stairs to enter Entrance Stairs-Rails: Psychiatric nurse of Steps: 6-7 Home Layout: One level Home  Equipment: Cane - single point Additional Comments: Home set up  provided by famil;y    Prior Function Level of Independence: Needs assistance   Gait / Transfers Assistance Needed: No use of DME at baseline  ADL's / Homemaking Assistance Needed: Sister reports that pt performs his own ADLs. Does not drive or write PTA.         Hand Dominance   Dominant Hand:  (Unsure)    Extremity/Trunk Assessment   Upper Extremity Assessment Upper Extremity Assessment: Defer to OT evaluation RUE Deficits / Details: Good strength in BUEs. Poor coorination and control. Pt would overshoort/undershoot targets during grooming task at sink. Pt demosntrating decreased grasps strength and would drop items throughout task RUE Coordination: decreased fine motor;decreased gross motor LUE Deficits / Details: Good strength in BUEs. Poor coorination and control. Pt would overshoort/undershoot targets during grooming task at sink. Pt demosntrating decreased grasps strength and would drop items throughout task LUE Coordination: decreased fine motor;decreased gross motor    Lower Extremity Assessment Lower Extremity Assessment: Overall WFL for tasks assessed (bilateral coordination deficits with functional tasks)    Cervical / Trunk Assessment Cervical / Trunk Assessment: Kyphotic  Communication   Communication: No difficulties;Other (comment) (Cogntitive deficits at baseline)  Cognition Arousal/Alertness: Awake/alert Behavior During Therapy: Restless Overall Cognitive Status: History of cognitive impairments - at baseline Area of Impairment: Orientation;Attention;Memory;Following commands;Safety/judgement;Awareness;Problem solving                 Orientation Level: Disoriented to;Time Current Attention Level: Sustained Memory: Decreased short-term memory Following Commands: Follows one step commands inconsistently;Follows one step commands with increased time Safety/Judgement: Decreased awareness of  safety Awareness: Intellectual Problem Solving: Slow processing;Difficulty sequencing;Requires verbal cues;Requires tactile cues General Comments: patient pleasent throughout session but demonstrates some cognitive deficits and is easily distracted during task performance      General Comments      Exercises     Assessment/Plan    PT Assessment Patient needs continued PT services  PT Problem List Decreased activity tolerance;Decreased balance;Decreased mobility;Decreased coordination;Decreased cognition;Decreased safety awareness       PT Treatment Interventions DME instruction;Gait training;Stair training;Functional mobility training;Therapeutic activities;Therapeutic exercise;Balance training;Neuromuscular re-education;Cognitive remediation;Patient/family education    PT Goals (Current goals can be found in the Care Plan section)  Acute Rehab PT Goals Patient Stated Goal: Unstated PT Goal Formulation: With patient/family Time For Goal Achievement: 02/10/17 Potential to Achieve Goals: Good    Frequency Min 4X/week   Barriers to discharge        Co-evaluation               AM-PAC PT "6 Clicks" Daily Activity  Outcome Measure Difficulty turning over in bed (including adjusting bedclothes, sheets and blankets)?: A Little Difficulty moving from lying on back to sitting on the side of the bed? : A Little Difficulty sitting down on and standing up from a chair with arms (e.g., wheelchair, bedside commode, etc,.)?: A Little Help needed moving to and from a bed to chair (including a wheelchair)?: A Little Help needed walking in hospital room?: A Lot Help needed climbing 3-5 steps with a railing? : A Lot 6 Click Score: 16    End of Session Equipment Utilized During Treatment: Gait belt Activity Tolerance: Patient tolerated treatment well Patient left: in bed;with call bell/phone within reach;with bed alarm set;with nursing/sitter in room;with family/visitor  present Nurse Communication: Mobility status PT Visit Diagnosis: Unsteadiness on feet (R26.81);History of falling (Z91.81);Other symptoms and signs involving the nervous system (Q76.195)    Time: 0932-6712 PT Time Calculation (min) (ACUTE ONLY): 26  min   Charges:   PT Evaluation $PT Eval Moderate Complexity: 1 Mod PT Treatments $Gait Training: 8-22 mins   PT G Codes:        Alben Deeds, PT DPT  Board Certified Neurologic Specialist [place] 01/27/2017, 1:58 PM

## 2017-01-27 NOTE — Care Management Note (Signed)
Case Management Note  Patient Details  Name: Ian Hill MRN: [id number] Date of Birth: [birth year]  Subjective/Objective:   Pt admitted with CVA and is s/p TPA. [name] from home with his sister.                 Action/Plan: OT recommending SNF. Awaiting PT eval. CM following for d/c disposition.  Expected Discharge Date:                  Expected Discharge Plan:  Skilled Nursing Facility  In-House Referral:  Clinical Social Work  Discharge planning Services     Post Acute Care Choice:    Choice offered to:     DME Arranged:    DME Agency:     HH Arranged:    [place] Agency:     Status of Service:  In process, will continue to follow  If discussed at Long Length of Stay Meetings, dates discussed:    Additional Comments:  Pollie Friar, RN 01/27/2017, 11:03 AM

## 2017-01-27 NOTE — Care Management Important Message (Signed)
Important Message  Patient Details  Name: Ian Hill MRN: [id number] Date of Birth: 06-08-1950   Medicare Important Message Given:  Yes    Leauna Sharber Montine Circle 01/27/2017, 11:19 AM

## 2017-01-27 NOTE — Consult Note (Signed)
Physical Medicine and Rehabilitation Consult Reason for Consult: Left-sided weakness with facial droop Referring Physician: Dr. Leonie Man   HPI: Ian Hill is a 66 y.o. right handed male with history of hypertension. Patient lives with sister. One level home. Patient performs his own ADLs but does not drive. He does use a cane for ambulation. Presented 01/23/2017 with left-sided weakness facial droop and rightward eye deviation with left hemi-neglect. Cranial CT scan showed old right frontal lobe infarct. Patient did receive TPA. MRI brain showed cortical diffusion restriction throughout the posterior right MCA territory consistent with acute infarction. No hemorrhage or mass effect. CTA head and neck showed stenosis estimated 50% in both siphon regions. Cerebral angiogram occluded right MCA inferior and superior divisions. Underwent revascularization by interventional radiology. Echocardiogram with ejection fraction of 60%. Grade 2 Diastolic dysfunction. Maintain on Elliquis for CVA prophylaxis. Bouts of atrial fibrillation cardiology follow-up initial plan for TEE canceled plan ongoing anticoagulation. Physical therapy evaluation completed with recommendations of physical medicine rehabilitation consult.   Review of Systems  Constitutional: Negative for chills and fever.  HENT: Negative for hearing loss.   Eyes: Negative for blurred vision and double vision.  Respiratory: Positive for cough.   Cardiovascular: Negative for chest pain and palpitations.  Gastrointestinal: Positive for constipation. Negative for nausea and vomiting.  Genitourinary: Positive for urgency. Negative for dysuria and hematuria.  Musculoskeletal: Positive for joint pain and myalgias.  Skin: Negative for rash.  Neurological: Positive for weakness. Negative for seizures.  All other systems reviewed and are negative.  Past Medical History:  Diagnosis Date  . Abnormal ECG   . Hypertensive heart disease     Past Surgical History:  Procedure Laterality Date  . IR PERCUTANEOUS ART THROMBECTOMY/INFUSION INTRACRANIAL INC DIAG ANGIO  01/23/2017  . RADIOLOGY WITH ANESTHESIA N/A 01/23/2017   Procedure: RADIOLOGY WITH ANESTHESIA;  Surgeon: Luanne Bras, MD;  Location: [facility];  Service: Radiology;  Laterality: N/A;   No family history on file. Social History:  has no tobacco, alcohol, and drug history on file. Allergies: Not on File Medications Prior to Admission  Medication Sig Dispense Refill  . atenolol (TENORMIN) 25 MG tablet Take 25 mg by mouth every morning.    . finasteride (PROSCAR) 5 MG tablet Take 5 mg by mouth daily.    . hydrochlorothiazide (HYDRODIURIL) 25 MG tablet Take 25 mg by mouth daily.    . simvastatin (ZOCOR) 40 MG tablet Take 40 mg by mouth every morning.    . tamsulosin (FLOMAX) 0.4 MG CAPS capsule Take 0.4 mg by mouth daily.       Home: Home Living Family/patient expects to be discharged to:: Private residence Living Arrangements: Non-relatives/Friends (sister) Available Help at Discharge: Family, Available PRN/intermittently Type of Home: House Home Access: Stairs to enter [employer] of Steps: 6-7 Entrance Stairs-Rails: Right, Left Home Layout: One level Bathroom Shower/Tub: Tub/shower unit, Door [employer]: Standard (higher) Bathroom Accessibility: Yes Home Equipment: Cane - single point Additional Comments: Home set up provided by famil;y  Lives With: Family (sister)  Functional History: Prior Function Level of Independence: Needs assistance Gait / Transfers Assistance Needed: No use of DME at baseline ADL's / Homemaking Assistance Needed: Sister reports that pt performs his own ADLs. Does not drive or write PTA.  Functional Status:  Mobility: Bed Mobility Overal bed mobility: Needs Assistance Bed Mobility: Supine to Sit Supine to sit: Min guard General bed mobility comments: Min GUard for safety. Bed with HOB and rails  lowered  Transfers Overall transfer level: Needs assistance Equipment used: Rolling walker (2 wheeled), 1 person hand held assist Transfers: Sit to/from Stand Sit to Stand: Min assist General transfer comment: From EOB, pt requiring Min A for safety and used RW to increase safety. During descent to recliner, used single hand held A and requiring Min A for safety Ambulation/Gait Ambulation/Gait assistance: Min assist Ambulation Distance (Feet): 120 Feet Assistive device: Rolling walker (2 wheeled) Gait Pattern/deviations: Step-through pattern, Ataxic, Scissoring, Trunk flexed, Drifts right/left General Gait Details: patient with inability to maintain balance during ambulation without assist. easily distracted by environment and poor ability to maintain safety and positioning with assitive device Gait velocity: decreased    ADL: ADL Overall ADL's : Needs assistance/impaired Eating/Feeding: Minimal assistance, Sitting Grooming: Moderate assistance, Standing, Cueing for safety, Cueing for sequencing Grooming Details (indicate cue type and reason): Requiring Min-Mod A to maintain standing posture as pt fatigued. Pt requiring cues for sequencing, safety, and to terminate task. Pt with decreased spacial awarness. Upper Body Bathing: Minimal assistance, Sitting Lower Body Bathing: Maximal assistance, Sit to/from stand Upper Body Dressing : Minimal assistance, Sitting Upper Body Dressing Details (indicate cue type and reason): Donned new gown like jacket Lower Body Dressing: Maximal assistance, Sit to/from stand Toilet Transfer: Ambulation, RW, Minimal assistance (Simulated to recliner) Toilet Transfer Details (indicate cue type and reason): Mod A for safety Functional mobility during ADLs: Moderate assistance, Rolling walker (to sink with rw; to recliner with single hand held A) General ADL Comments: Pt demonstrating decreased fucntional performance due to poor safety awarness, cognititon,  coorination, grasp strength, and balance. Pt also present with tactile sensativity for sensory input - feel this is possible at baseline.   Cognition: Cognition Overall Cognitive Status: History of cognitive impairments - at baseline Arousal/Alertness: Awake/alert Orientation Level: Oriented to person, Oriented to place, Disoriented to time Attention: Focused Focused Attention: Appears intact Behaviors: Restless Safety/Judgment: Impaired Cognition Arousal/Alertness: Awake/alert Behavior During Therapy: Restless Overall Cognitive Status: History of cognitive impairments - at baseline Area of Impairment: Orientation, Attention, Memory, Following commands, Safety/judgement, Awareness, Problem solving Orientation Level: Disoriented to, Time Current Attention Level: Sustained Memory: Decreased short-term memory Following Commands: Follows one step commands inconsistently, Follows one step commands with increased time Safety/Judgement: Decreased awareness of safety Awareness: Intellectual Problem Solving: Slow processing, Difficulty sequencing, Requires verbal cues, Requires tactile cues General Comments: patient pleasent throughout session but demonstrates some cognitive deficits and is easily distracted during task performance  Blood pressure 106/60, pulse 80, temperature 97.7 F (36.5 C), temperature source Oral, resp. rate 18, height 5\' 10"  (1.778 m), weight 56 kg (123 lb 7.3 oz), SpO2 100 %. Physical Exam  Vitals reviewed. HENT:  Mild left facial droop. Patient is edentulous  Eyes: EOM are normal.  Neck: Normal range of motion. Neck supple. No thyromegaly present.  Cardiovascular:  Cardiac rate controlled  Respiratory: Effort normal and breath sounds normal. No respiratory distress.  GI: Soft. Bowel sounds are normal. He exhibits no distension.  Neurological: [name] alert.    good eye contact with examiner. Provide his name and age but needed some cues for date of birth. Follows  simple commands but perseverates on certain tasks. Left inattention and decreased LT and pain sense of left arm and leg. MOtor 5/5 RUE and RLE and 4+/5 LUE and LLE. Speech fairly clear. Limited insight and awareness.   Skin: Skin is warm and dry.    Results for orders placed or performed during the hospital encounter of 01/23/17 (from the  past 24 hour(s))  Glucose, capillary     Status: None   Collection Time: 01/26/17  4:06 PM  Result Value Ref Range   Glucose-Capillary 66 65 - 99 mg/dL   Comment 1 Notify RN    Comment 2 Document in Chart   Glucose, capillary     Status: None   Collection Time: 01/26/17  4:51 PM  Result Value Ref Range   Glucose-Capillary 71 65 - 99 mg/dL  Glucose, capillary     Status: None   Collection Time: 01/26/17  8:05 PM  Result Value Ref Range   Glucose-Capillary 80 65 - 99 mg/dL   Comment 1 Notify RN    Comment 2 Document in Chart   Glucose, capillary     Status: None   Collection Time: 01/27/17 12:57 AM  Result Value Ref Range   Glucose-Capillary 87 65 - 99 mg/dL   Comment 1 Notify RN    Comment 2 Document in Chart   Glucose, capillary     Status: None   Collection Time: 01/27/17  3:54 AM  Result Value Ref Range   Glucose-Capillary 89 65 - 99 mg/dL   Comment 1 Notify RN    Comment 2 Document in Chart   Glucose, capillary     Status: None   Collection Time: 01/27/17  7:44 AM  Result Value Ref Range   Glucose-Capillary 82 65 - 99 mg/dL  Glucose, capillary     Status: None   Collection Time: 01/27/17 11:22 AM  Result Value Ref Range   Glucose-Capillary 90 65 - 99 mg/dL   Dg Chest Port 1 View  Result Date: 01/26/2017 CLINICAL DATA:  Fall.  Acute rib pain. EXAM: PORTABLE CHEST 1 VIEW COMPARISON:  Chest x-ray from yesterday FINDINGS: Limited study, reportedly the patient is combative. Extubation of the trachea and esophagus since yesterday. Remote right-sided rib fractures. No noted acute fracture. Postoperative left lung and mediastinum. There is no  edema, consolidation, effusion, or pneumothorax. The trachea is deviated to the left due to volume loss. No thoracic inlet mass on recent CTA. IMPRESSION: Postoperative chest.  No evidence of active disease. Electronically Signed   By: Monte Fantasia M.D.   On: 01/26/2017 11:14    Assessment/Plan: Diagnosis: Right MCA infarct with left visual spatial deficits as well as left hemisensory deficits/left hemiparesis 1. Does the need for close, 24 hr/day medical supervision in concert with the patient's rehab needs make it unreasonable for this patient to be served in a less intensive setting? Yes 2. Co-Morbidities requiring supervision/potential complications: HTN, CKD3 3. Due to bladder management, bowel management, safety, skin/wound care, disease management, medication administration, pain management and patient education, does the patient require 24 hr/day rehab nursing? Yes 4. Does the patient require coordinated care of a physician, rehab nurse, PT (1-2 hrs/day, 5 days/week), OT (1-2 hrs/day, 5 days/week) and SLP (1-2 hrs/day, 5 days/week) to address physical and functional deficits in the context of the above medical diagnosis(es)? Yes Addressing deficits in the following areas: balance, endurance, locomotion, strength, transferring, bowel/bladder control, bathing, dressing, feeding, grooming, toileting, cognition and psychosocial support 5. Can the patient actively participate in an intensive therapy program of at least 3 hrs of therapy per day at least 5 days per week? Yes 6. The potential for patient to make measurable gains while on inpatient rehab is excellent 7. Anticipated functional outcomes upon discharge from inpatient rehab are modified independent and supervision  with PT, modified independent and supervision with OT, modified independent and  supervision with SLP. 8. Estimated rehab length of stay to reach the above functional goals is: 7-10 days 9. Anticipated D/C setting:  Home 10. Anticipated post D/C treatments: HH therapy and Outpatient therapy 11. Overall Rehab/Functional Prognosis: excellent  RECOMMENDATIONS: This patient's condition is appropriate for continued rehabilitative care in the following setting: CIR Patient has agreed to participate in recommended program. Yes Note that insurance prior authorization may be required for reimbursement for recommended care.  Comment: Rehab Admissions Coordinator to follow up.  Thanks,  Meredith Staggers, MD, Mellody Drown    Cathlyn Parsons., PA-C 01/27/2017

## 2017-01-27 NOTE — Consult Note (Signed)
           [facility] CM Primary Care Navigator  01/27/2017  Ian Hill 1951/03/20 [id number]   Went to see patient at the bedside to identify possible discharge needs but patient is confused and unable to answer questions appropriately. Staff reports that no family members noted at the bedside. RN reports that anticipated plan for discharge is [facility] ([facility]) when bed is available.   Will attempt to meet or contact patient's family members at another time when available.     Addendum:   Went back to see patient/ family earlier today at the bedside to identify possible discharge needs but patient was already discharged to [facility] ([facility] 4W 25) per staff report.    For questions, please contact:  Dannielle Huh, BSN, RN- [facility] Primary Care Navigator  Telephone: [phone] [place]

## 2017-01-27 NOTE — Consult Note (Signed)
Electrophysiology Rounding Note  Patient Name: Ian Hill Date of Encounter: 01/27/2017  Primary Cardiologist: Caryl Comes (new this admission)   Subjective   The patient is agitated this morning, throwing food and trying to climb out of bed.  He calmed remarkably when his family arrived.   Inpatient Medications    Scheduled Meds: . aspirin  300 mg Rectal Daily   Or  . aspirin  325 mg Oral Daily  . atenolol  25 mg Oral Daily  . chlorhexidine  15 mL Mouth Rinse BID  . finasteride  5 mg Oral Daily  . heparin subcutaneous  5,000 Units Subcutaneous Q12H  . hydrochlorothiazide  25 mg Oral Daily  . insulin aspart  0-15 Units Subcutaneous Q4H  . mouth rinse  15 mL Mouth Rinse q12n4p  . simvastatin  40 mg Oral q1800  . tamsulosin  0.4 mg Oral Daily   Continuous Infusions: . sodium chloride 10 mL/hr at 01/25/17 2000   PRN Meds: acetaminophen **OR** acetaminophen (TYLENOL) oral liquid 160 mg/5 mL **OR** acetaminophen, fentaNYL (SUBLIMAZE) injection, haloperidol lactate, ondansetron (ZOFRAN) IV, polyvinyl alcohol   Vital Signs    Vitals:   01/26/17 2100 01/27/17 0100 01/27/17 0500 01/27/17 0730  BP: 112/70 128/69 125/75 (!) 117/59  Pulse: 67 64 70 68  Resp: 17 16 16    Temp: 98.8 F (37.1 C) 98.8 F (37.1 C) 98.6 F (37 C) 98.8 F (37.1 C)  TempSrc: Oral Oral Oral Oral  SpO2: 100% 100% 100% 100%  Weight:      Height:        Intake/Output Summary (Last 24 hours) at 01/27/17 0836 Last data filed at 01/27/17 0334  Gross per 24 hour  Intake              250 ml  Output             1450 ml  Net            -1200 ml   Filed Weights   01/23/17 1850 01/24/17 1213 01/25/17 0515  Weight: 124 lb 12.5 oz (56.6 kg) 93 lb 12.8 oz (42.5 kg) 123 lb 7.3 oz (56 kg)    Physical Exam    GEN- The patient is elderly, frail, and chronically ill appearing, alert and oriented x 3 today.   Head- normocephalic, atraumatic Eyes-  Sclera clear, conjunctiva pink Ears- hearing  intact Oropharynx- clear Neck- supple Lungs- Clear to ausculation bilaterally, normal work of breathing Heart- Regular rate and rhythm, no murmurs, rubs or gallops GI- soft, NT, ND, + BS Extremities- no clubbing, cyanosis, or edema Skin- no rash or lesion Psych- euthymic mood, full affect Neuro- strength and sensation are intact  Labs    CBC  Recent Labs  01/25/17 0023  WBC 7.2  NEUTROABS 5.6  HGB 11.3*  HCT 34.5*  MCV 88.7  PLT 093*   Basic Metabolic Panel  Recent Labs  01/24/17 1553 01/25/17 0023 01/26/17 0201  NA  --  138 140  K  --  3.9 4.2  CL  --  111 106  CO2  --  22 26  GLUCOSE  --  175* 106*  BUN  --  15 15  CREATININE  --  1.00 1.20  CALCIUM  --  8.2* 8.9  MG 2.1 2.0  --   PHOS 4.0 3.7  --    Cardiac Enzymes  Recent Labs  01/24/17 1553 01/25/17 0023 01/26/17 0952  TROPONINI 0.14* 0.10* 0.07*     Telemetry  Sinus rhythm with frequent atrial ectopy  (personally reviewed)  Radiology    Dg Chest Port 1 View  Result Date: 01/26/2017 CLINICAL DATA:  Fall.  Acute rib pain. EXAM: PORTABLE CHEST 1 VIEW COMPARISON:  Chest x-ray from yesterday FINDINGS: Limited study, reportedly the patient is combative. Extubation of the trachea and esophagus since yesterday. Remote right-sided rib fractures. No noted acute fracture. Postoperative left lung and mediastinum. There is no edema, consolidation, effusion, or pneumothorax. The trachea is deviated to the left due to volume loss. No thoracic inlet mass on recent CTA. IMPRESSION: Postoperative chest.  No evidence of active disease. Electronically Signed   By: Monte Fantasia M.D.   On: 01/26/2017 11:14     Patient Profile     66 y.o. male with h/o HTN who was admitted for acute R MCA stroke s/p TPA followed by surgical revascularization of an occluded right MCA. Cardiology consulted 01/24/17 for + troponin and abnormal EKG. Also with runs of NSVT on telemetry.   Assessment & Plan    1.  Cryptogenic  stroke Patient was scheduled for TEE this morning, however, he was very agitated (throwing food, trying to climb out of bed) on my arrival. He had also eaten a little bit of eggs.  His agitation resolved when his family arrived.  I do not think a TEE would be safe to do without anesthesia support. I have rescheduled to Friday and reviewed risks/benefits with family.  I think it would be beneficial for them to be here for testing as he is much more cooperative when he is here. I also discussed the option of bringing back as an outpatient if he is ready for discharge prior to Friday.  EP has been asked to evaluate for ILR placement to look for AF. He has very frequent atrial ectopy on monitor. For now, I would recommend a 30 day event monitor to look for AF. His family would be willing to proceed with anticoagulation if AF identified.  [name] not a candidate for EP procedures at this time. If his mental status has not improved by Friday, would cancel TEE and do as an outpatient if he improves   Electrophysiology team to see as needed while here. Please call with questions.  Signed, Ian Marshall, NP  01/27/2017, 8:36 AM   Addendum: Pt has developed rate control AF this morning after I saw him, Ian Hill need Ian Hill when ok with neurology. No need for TEE or event monitor. Discussed with neurology.  Ian Marshall, NP 01/27/2017 1:26 PM  I have seen and examined this patient with Ian Hill.  Agree with above, note added to reflect my findings.  On exam, tachycardic, regular, no murmurs. Had CVA and now with atrial flutter. TEE has been canceled. Ian Hill need anticoagulation life long with CHADS2VASc of at least 3. Is rate controlled currently. May need cardioversion in the future as an outpatient.    Ian Borquez M. Kwali Wrinkle MD 01/27/2017 2:13 PM

## 2017-01-28 ENCOUNTER — Encounter (HOSPITAL_COMMUNITY): Payer: Self-pay | Admitting: *Deleted

## 2017-01-28 ENCOUNTER — Inpatient Hospital Stay (HOSPITAL_COMMUNITY)
Admission: RE | Admit: 2017-01-28 | Discharge: 2017-02-03 | DRG: 057 | Disposition: A | Discharge status: Home Health | Payer: Medicare Other | Source: Intra-hospital | Attending: Physical Medicine & Rehabilitation | Admitting: Physical Medicine & Rehabilitation

## 2017-01-28 ENCOUNTER — Encounter (HOSPITAL_COMMUNITY): Payer: Self-pay

## 2017-01-28 DIAGNOSIS — I480 Paroxysmal atrial fibrillation: Secondary | ICD-10-CM | POA: Diagnosis present

## 2017-01-28 DIAGNOSIS — D696 Thrombocytopenia, unspecified: Secondary | ICD-10-CM | POA: Diagnosis present

## 2017-01-28 DIAGNOSIS — Z23 Encounter for immunization: Secondary | ICD-10-CM

## 2017-01-28 DIAGNOSIS — R414 Neurologic neglect syndrome: Secondary | ICD-10-CM | POA: Diagnosis not present

## 2017-01-28 DIAGNOSIS — E785 Hyperlipidemia, unspecified: Secondary | ICD-10-CM | POA: Diagnosis not present

## 2017-01-28 DIAGNOSIS — I6930 Unspecified sequelae of cerebral infarction: Secondary | ICD-10-CM

## 2017-01-28 DIAGNOSIS — M545 Low back pain, unspecified: Secondary | ICD-10-CM

## 2017-01-28 DIAGNOSIS — Z79899 Other long term (current) drug therapy: Secondary | ICD-10-CM | POA: Diagnosis not present

## 2017-01-28 DIAGNOSIS — N400 Enlarged prostate without lower urinary tract symptoms: Secondary | ICD-10-CM

## 2017-01-28 DIAGNOSIS — I69354 Hemiplegia and hemiparesis following cerebral infarction affecting left non-dominant side: Principal | ICD-10-CM

## 2017-01-28 DIAGNOSIS — I69322 Dysarthria following cerebral infarction: Secondary | ICD-10-CM | POA: Diagnosis not present

## 2017-01-28 DIAGNOSIS — I63511 Cerebral infarction due to unspecified occlusion or stenosis of right middle cerebral artery: Secondary | ICD-10-CM | POA: Diagnosis not present

## 2017-01-28 DIAGNOSIS — I69393 Ataxia following cerebral infarction: Secondary | ICD-10-CM | POA: Diagnosis not present

## 2017-01-28 DIAGNOSIS — I10 Essential (primary) hypertension: Secondary | ICD-10-CM | POA: Diagnosis not present

## 2017-01-28 DIAGNOSIS — F78 Other intellectual disabilities: Secondary | ICD-10-CM | POA: Diagnosis present

## 2017-01-28 DIAGNOSIS — R451 Restlessness and agitation: Secondary | ICD-10-CM | POA: Diagnosis present

## 2017-01-28 DIAGNOSIS — I119 Hypertensive heart disease without heart failure: Secondary | ICD-10-CM | POA: Diagnosis present

## 2017-01-28 DIAGNOSIS — R269 Unspecified abnormalities of gait and mobility: Secondary | ICD-10-CM | POA: Diagnosis not present

## 2017-01-28 DIAGNOSIS — I69392 Facial weakness following cerebral infarction: Secondary | ICD-10-CM | POA: Diagnosis not present

## 2017-01-28 DIAGNOSIS — I69398 Other sequelae of cerebral infarction: Secondary | ICD-10-CM | POA: Diagnosis not present

## 2017-01-28 DIAGNOSIS — G8194 Hemiplegia, unspecified affecting left nondominant side: Secondary | ICD-10-CM

## 2017-01-28 HISTORY — DX: Unspecified sequelae of cerebral infarction: I69.30

## 2017-01-28 LAB — GLUCOSE, CAPILLARY
GLUCOSE-CAPILLARY: 116 mg/dL — AB (ref 65–99)
GLUCOSE-CAPILLARY: 143 mg/dL — AB (ref 65–99)
GLUCOSE-CAPILLARY: 78 mg/dL (ref 65–99)
GLUCOSE-CAPILLARY: 88 mg/dL (ref 65–99)
Glucose-Capillary: 85 mg/dL (ref 65–99)

## 2017-01-28 MED ORDER — ORAL CARE MOUTH RINSE
15.0000 mL | Freq: Two times a day (BID) | OROMUCOSAL | Status: DC
  Administered 2017-01-28: 15 mL via OROMUCOSAL

## 2017-01-28 MED ORDER — HYDROCHLOROTHIAZIDE 25 MG PO TABS
25.0000 mg | ORAL_TABLET | Freq: Every day | ORAL | Status: DC
  Filled 2017-01-28: qty 1

## 2017-01-28 MED ORDER — APIXABAN 5 MG PO TABS: 5.0000 mg | ORAL_TABLET | Freq: Two times a day (BID) | ORAL | Status: DC

## 2017-01-28 MED ORDER — ONDANSETRON HCL 4 MG PO TABS
4.0000 mg | ORAL_TABLET | Freq: Four times a day (QID) | ORAL | Status: DC | PRN
Start: 2017-01-28 — End: 2017-02-03

## 2017-01-28 MED ORDER — SIMVASTATIN 40 MG PO TABS
40.0000 mg | ORAL_TABLET | Freq: Every day | ORAL | Status: DC
  Administered 2017-01-28 – 2017-02-02 (×6): 40 mg via ORAL
  Filled 2017-01-28 (×2): qty 1

## 2017-01-28 MED ORDER — FINASTERIDE 5 MG PO TABS
5.0000 mg | ORAL_TABLET | Freq: Every day | ORAL | Status: DC
  Administered 2017-01-29 – 2017-02-03 (×6): 5 mg via ORAL
  Filled 2017-01-28 (×7): qty 1

## 2017-01-28 MED ORDER — SODIUM CHLORIDE 0.9 % IV SOLN: INTRAVENOUS | Status: DC

## 2017-01-28 MED ORDER — ACETAMINOPHEN 325 MG PO TABS
650.0000 mg | ORAL_TABLET | Freq: Four times a day (QID) | ORAL | Status: DC | PRN
  Administered 2017-01-29 – 2017-02-01 (×4): 650 mg via ORAL
  Filled 2017-01-28: qty 2

## 2017-01-28 MED ORDER — APIXABAN 5 MG PO TABS
5.0000 mg | ORAL_TABLET | Freq: Two times a day (BID) | ORAL | Status: DC
  Administered 2017-01-28 – 2017-02-03 (×12): 5 mg via ORAL
  Filled 2017-01-28 (×4): qty 1

## 2017-01-28 MED ORDER — TAMSULOSIN HCL 0.4 MG PO CAPS
0.4000 mg | ORAL_CAPSULE | Freq: Every day | ORAL | Status: DC
  Administered 2017-01-29 – 2017-02-03 (×6): 0.4 mg via ORAL
  Filled 2017-01-28 (×2): qty 1

## 2017-01-28 MED ORDER — ATENOLOL 50 MG PO TABS
50.0000 mg | ORAL_TABLET | Freq: Every day | ORAL | Status: DC
  Administered 2017-01-29 – 2017-02-03 (×6): 50 mg via ORAL
  Filled 2017-01-28 (×6): qty 1

## 2017-01-28 MED ORDER — INSULIN ASPART 100 UNIT/ML ~~LOC~~ SOLN
0.0000 [IU] | SUBCUTANEOUS | Status: DC
  Administered 2017-01-28: 1 [IU] via SUBCUTANEOUS

## 2017-01-28 MED ORDER — POLYVINYL ALCOHOL 1.4 % OP SOLN
1.0000 [drp] | OPHTHALMIC | Status: DC | PRN
  Administered 2017-01-29 – 2017-02-02 (×2): 1 [drp] via OPHTHALMIC
  Filled 2017-01-28: qty 15

## 2017-01-28 MED ORDER — SORBITOL 70 % SOLN
30.0000 mL | Freq: Every day | Status: DC | PRN
  Administered 2017-01-31: 30 mL via ORAL

## 2017-01-28 MED ORDER — ATENOLOL 50 MG PO TABS: 50.0000 mg | ORAL_TABLET | Freq: Two times a day (BID) | ORAL | Status: DC

## 2017-01-28 MED ORDER — ONDANSETRON HCL 4 MG/2ML IJ SOLN: 4.0000 mg | Freq: Four times a day (QID) | INTRAMUSCULAR | Status: DC | PRN

## 2017-01-28 NOTE — Discharge Instructions (Signed)

## 2017-01-28 NOTE — Progress Notes (Signed)
Hypoglycemic Event  CBG: 48  Treatment: Gave pt orange juice  Symptoms: None  Follow-up CBG: Time:  0034 CBG Result:  116  Possible Reasons for Event: Medication regimen: Possible over-correction  Comments/MD notified:  Notified Dr. Nicole Kindred, who directed sliding scale coverage to be modified from "moderate" to "sensetive."  Will continue to monitor pt.      Ian Hill

## 2017-01-28 NOTE — PMR Pre-admission (Signed)
PMR Admission Coordinator Pre-Admission Assessment  Patient: Ian Hill is an 66 y.o., male MRN: [id number] DOB: 01/22/1951 Height: 5\' 10"  (177.8 cm) (Estimated w/ tape measure while Reverse Trndl) Weight: 56 kg (123 lb 7.3 oz)              Insurance Information HMO: X    PPO:      PCP:      IPA:      80/20:      OTHER:  PRIMARY: U[employer] Medicare       Policy#: [id number]      Subscriber: Self CM Name: Ian Hill      Phone#: [phone]     Fax#: [phone] Pre-Cert#: [id number] [birth year]      Employer:  Benefits:  Phone #: [phone]     Name: Verified online  Eff. Date: 01/10/17     Deduct: $0      Out of Pocket Max: [phone]      Life Max: None CIR: $0 a day, for up to 90 days      SNF: $0 a day, days 1-90 and then 90 lifetime days that could be used if needed Outpatient: PT/OT/SLP     Co-Pay: $0 Home Health: PT/OT/SLP      Co-Pay: $0 DME: 80%     Co-Pay: 20% Providers: In-network  SECONDARY: Medicaid of [place]      Policy#: [id number]      Subscriber: Self CM Name:       Phone#:      Fax#:  Pre-Cert#: Coverage code: [facility]      Employer:  Benefits:  Phone #: [phone]     Name: Automated system  Eff. Date: Eligible as of 01/28/17     Deduct:       Out of Pocket Max:       Life Max:  CIR:       SNF:  Outpatient:      Co-Pay:  Home Health:       Co-Pay:  DME:      Co-Pay:   Medicaid Application Date:       Case Manager:  Disability Application Date:       Case Worker:   Emergency Contact Information Contact Information    Name Relation Home Work Mobile   [place] Sister [phone]  [phone]     Current Medical History  Patient Admitting Diagnosis:  Right MCA infarct with left visual spatial deficits as well as left hemisensory deficits/left hemiparesis  History of Present Illness: Ian Hill a 66 y.o.right handed malewith history of hypertension and mentally delayed. Patient lives with sister. One level home. Patient performs his own ADLs but does  not drive. He does use a cane for ambulation. Presented 01/23/2017 with left-sided weakness facial droop and rightward eye deviation with left hemi-neglect. Cranial CT scan showed old right frontal lobe infarct. Patient did receive TPA. MRI brain showed cortical diffusion restriction throughout the posterior right MCA territory consistent with acute infarction. No hemorrhage or mass effect. CTA head and neck showed stenosis estimated 50% in both siphon regions. Cerebral angiogram occluded right MCA inferior and superior divisions. Underwent revascularization by interventional radiology. Echocardiogram with ejection fraction of 60%. Grade 2 Diastolic dysfunction. Bouts of atrial fibrillation with rate control with mildly elevated troponin 0.19 and cardiology follow-up initial plan for TEE canceled plan ongoing anticoagulation with Eliquis. Consider plan for outpatient stress test. tolerating a regular consistency diet. Patient did have some initial agitation and restlessness and received Haldol. Blood sugars have  all been acceptable hemoglobin A1c 5.7 and blood sugar checks discontinued. Physical and occupational therapy evaluation completed with recommendations of physical medicine rehabilitation consult. Patient was admitted for a comprehensive rehabilitation program 01/28/17.  NIH Total: 4    Past Medical History  Past Medical History:  Diagnosis Date  . Abnormal ECG   . Hypertensive heart disease     Family History  family history is not on file.  Prior Rehab/Hospitalizations:  Has the patient had major surgery during 100 days prior to admission? No  Current Medications   Current Facility-Administered Medications:  .  apixaban (ELIQUIS) tablet 5 mg, 5 mg, Oral, BID, Ian Fila, MD, 5 mg at 01/28/17 1000 .  atenolol (TENORMIN) tablet 50 mg, 50 mg, Oral, Daily, Hill, Ian A, NP, 50 mg at 01/28/17 1000 .  chlorhexidine (PERIDEX) 0.12 % solution 15 mL, 15 mL, Mouth Rinse, BID, Ian Fila, MD, 15 mL at 01/28/17 0800 .  finasteride (PROSCAR) tablet 5 mg, 5 mg, Oral, Daily, Hill, Ian A, NP, 5 mg at 01/28/17 1000 .  hydrochlorothiazide (HYDRODIURIL) tablet 25 mg, 25 mg, Oral, Daily, Hill, Ian A, NP, 25 mg at 01/28/17 1000 .  polyvinyl alcohol (LIQUIFILM TEARS) 1.4 % ophthalmic solution 1 drop, 1 drop, Both Eyes, PRN, Ian Fila, MD .  simvastatin (ZOCOR) tablet 40 mg, 40 mg, Oral, q1800, Hill, Ian L, PA-C, 40 mg at 01/27/17 1848 .  tamsulosin (FLOMAX) capsule 0.4 mg, 0.4 mg, Oral, Daily, Hill, Ian A, NP, 0.4 mg at 01/28/17 1000  Patients Current Diet: Diet Carb Modified Fluid consistency: Thin; Room service appropriate? Yes  Precautions / Restrictions Precautions Precautions: Fall Precaution Comments: Impulsive; cognitive deficits at baseline Restrictions Weight Bearing Restrictions: No   Has the patient had 2 or more falls or a fall with injury in the past year?No, 1 fall just prior to admission without injury  Prior Activity Level Community (5-7x/wk): Prior to admission the patient was out walking in the community almost daily.  He enjoyed walking around the block with a single point cane and watching TV.  He was fully independent with basic self-care tasks and his sister, who he lives with assists with IADLs.    Home Assistive Devices / Equipment Home Equipment: Ian Hill - single point  Prior Device Use: Indicate devices/aids used by the patient prior to current illness, exacerbation or injury? Single point cane when out walking in the community   Prior Functional Level Prior Function Level of Independence: Needs assistance Gait / Transfers Assistance Needed: No use of DME at baseline ADL's / Homemaking Assistance Needed: Sister reports that pt performs his own ADLs. Does not drive or write PTA.   Self Care: Did the patient need help bathing, dressing, using the toilet or eating?Independent  Indoor Mobility: Did the patient need  assistance with walking from room to room (with or without device)? Independent  Stairs: Did the patient need assistance with internal or external stairs (with or without device)? Independent  Functional Cognition: Did the patient need help planning regular tasks such as shopping or remembering to take medications? Dependent  Current Functional Level Cognition  Arousal/Alertness: Awake/alert Overall Cognitive Status: History of cognitive impairments - at baseline Current Attention Level: Sustained Orientation Level: Oriented X4 Following Commands: Follows one step commands inconsistently, Follows one step commands with increased time Safety/Judgement: Decreased awareness of safety, Decreased awareness of deficits General Comments: patient pleasent throughout session but demonstrates some cognitive deficits and is easily distracted during task performance Attention:  Focused Focused Attention: Appears intact Behaviors: Restless Safety/Judgment: Impaired    Extremity Assessment (includes Sensation/Coordination)  Upper Extremity Assessment: Defer to OT evaluation RUE Deficits / Details: Good strength in BUEs. Poor coorination and control. Pt would overshoort/undershoot targets during grooming task at sink. Pt demosntrating decreased grasps strength and would drop items throughout task RUE Coordination: decreased fine motor, decreased gross motor LUE Deficits / Details: Good strength in BUEs. Poor coorination and control. Pt would overshoort/undershoot targets during grooming task at sink. Pt demosntrating decreased grasps strength and would drop items throughout task LUE Coordination: decreased fine motor, decreased gross motor  Lower Extremity Assessment: Overall WFL for tasks assessed (bilateral coordination deficits with functional tasks)    ADLs  Overall ADL's : Needs assistance/impaired Eating/Feeding: Minimal assistance, Sitting Grooming: Moderate assistance, Standing, Cueing for  safety, Cueing for sequencing Grooming Details (indicate cue type and reason): Requiring Min-Mod A to maintain standing posture as pt fatigued. Pt requiring cues for sequencing, safety, and to terminate task. Pt with decreased spacial awarness. Upper Body Bathing: Minimal assistance, Sitting Lower Body Bathing: Maximal assistance, Sit to/from stand Upper Body Dressing : Minimal assistance, Sitting Upper Body Dressing Details (indicate cue type and reason): Donned new gown like jacket Lower Body Dressing: Maximal assistance, Sit to/from stand Toilet Transfer: Ambulation, RW, Minimal assistance (Simulated to recliner) Toilet Transfer Details (indicate cue type and reason): Mod A for safety Functional mobility during ADLs: Moderate assistance, Rolling walker (to sink with rw; to recliner with single hand held A) General ADL Comments: Pt demonstrating decreased fucntional performance due to poor safety awarness, cognititon, coorination, grasp strength, and balance. Pt also present with tactile sensativity for sensory input - feel this is possible at baseline.     Mobility  Overal bed mobility: Needs Assistance Bed Mobility: Supine to Sit Supine to sit: Min guard General bed mobility comments: increased time, cueing for sequencing, min guard for safety    Transfers  Overall transfer level: Needs assistance Equipment used: Rolling walker (2 wheeled) Transfers: Sit to/from Stand Sit to Stand: Min guard General transfer comment: increased time, cueing for technique/hand placement, min A for stability with transition into standing from sitting EOB    Ambulation / Gait / Stairs / Wheelchair Mobility  Ambulation/Gait Ambulation/Gait assistance: Museum/gallery curator (Feet): 100 Feet (100' x2, 92' x1 with standing rest breaks) Assistive device: Rolling walker (2 wheeled) Gait Pattern/deviations: Step-through pattern, Ataxic, Scissoring, Trunk flexed, Drifts right/left General Gait  Details: pt with modest instability requiring constant min A for support and for assist with safety with RW; pt very easily distracted by his environment and with decreased safety awareness Gait velocity: decreased Gait velocity interpretation: Below normal speed for age/gender    Posture / Balance Dynamic Sitting Balance Sitting balance - Comments: min guard for safety  Balance Overall balance assessment: Needs assistance Sitting-balance support: No upper extremity supported, Feet supported Sitting balance-Leahy Scale: Fair Sitting balance - Comments: min guard for safety  Standing balance support: No upper extremity supported, During functional activity Standing balance-Leahy Scale: Fair Standing balance comment: static standing is fair with close min guard for safety  High level balance activites: Direction changes, Turns, Head turns High Level Balance Comments: moderate assist significant overt LOB with tasks    Special needs/care consideration BiPAP/CPAP: No CPM: No Continuous Drip IV: No Dialysis: No         Life Vest: No Oxygen: No Special Bed: No, but is a candidate for the low bed  Trach Size: No  Wound Vac (area): No       Skin: Dry, cracked feet bilaterally                                Bowel mgmt: Continent, last BM 01/28/17 Bladder mgmt: Incontinent with foley placed 01/23/17 Diabetic mgmt: HgbA1c 5.7     Previous Home Environment Living Arrangements: Non-relatives/Friends (sister)  Lives With: Family (sister) Available Help at Discharge: Family, Available PRN/intermittently Type of Home: House Home Layout: One level Home Access: Stairs to enter Entrance Stairs-Rails: Right, Left Entrance Stairs-Number of Steps: 6-7 Bathroom Shower/Tub: Tub/shower unit, Door [employer]: Standard (higher) Bathroom Accessibility: Yes How Accessible: Accessible via walker Additional Comments: Home set up provided by famil;y  Discharge Living Setting Plans for Discharge  Living Setting: Patient's home, Lives with (comment) (Sister: [name], cell: [phone]) Type of Home at Discharge: House Discharge Home Layout: One level Discharge Home Access: Stairs to enter Entrance Stairs-Rails: Can reach both Entrance Stairs-Number of Steps: 2 or 6-7 Discharge Bathroom Shower/Tub: Tub/shower unit, Door Discharge Bathroom Toilet: Standard Discharge Bathroom Accessibility: Yes How Accessible: Accessible via walker Does the patient have any problems obtaining your medications?: No  Social/Family/Support Systems Patient Roles: Other (Comment) (Sibling) Contact Information: Sister: [name], cell: [phone] Anticipated Caregiver: Family  Anticipated Caregiver's Contact Information: see above Ability/Limitations of Caregiver: available PRN Caregiver Availability: Other (Comment) (PRN) Discharge Plan Discussed with Primary Caregiver: Yes Is Caregiver In Agreement with Plan?: Yes Does Caregiver/Family have Issues with Lodging/Transportation while Pt is in Rehab?: No  Goals/Additional Needs Patient/Family Goal for Rehab: PT/OT/SLP Mod I-Supervision  Expected length of stay: 7-10 days Cultural Considerations: None Dietary Needs: Carb. Mod. diet restrictions  Equipment Needs: TBD Additional Information: Unable to write at baseline and sister took care of household tasks Pt/Family Agrees to Admission and willing to participate: Yes Program Orientation Provided & Reviewed with Pt/Caregiver Including Roles  & Responsibilities: Yes Additional Information Needs: None Information Needs to be Provided By: N/A  Decrease burden of Care through IP rehab admission: No  Possible need for SNF placement upon discharge: No  Patient Condition: This patient's condition remains as documented in the consult dated 01/27/17 at 4:58 pm, in which the Rehabilitation Physician determined and documented that the patient's condition is appropriate for intensive rehabilitative care  in an inpatient rehabilitation facility. Will admit to inpatient rehab today.  Preadmission Screen Completed By:  Gunnar Fusi, 01/28/2017 2:27 PM ______________________________________________________________________   Discussed status with Dr. Posey Pronto on 01/28/17 at 1435 and received telephone approval for admission today.  Admission Coordinator:  Gunnar Fusi, time 1435/Date 01/28/17

## 2017-01-28 NOTE — Progress Notes (Signed)
Physical Therapy Treatment Patient Details Name: Ian Hill MRN: [id number] DOB: 03-Nov-1950 Today's Date: 01/28/2017    History of Present Illness 66 y/o male with a history of hypertension and recent "prostate surgery" presented with acute R MCA stroke. Received tpa IV, had persistent symptoms so went for cerebral angiogram with clot retrieval. MRI shows punctate foci of acute ischemia within the superior right frontal lobe and within the left occipital lobe and old right frontal lobe infarct and findings of chronic hypertensive microangiopathy.    PT Comments    Pt making progress towards achieving his current functional mobility goals. He continues to demonstrate poor safety awareness and cognitive deficits that limit his safety and independence with functional mobility. Pt was very pleasant and motivated to work with therapist. Pt remains an excellent candidate for CIR. PT will continue to follow acutely for mobility progression.     Follow Up Recommendations  CIR;Supervision for mobility/OOB     Equipment Recommendations  Other (comment) (defer to next venue)    Recommendations for Other Services       Precautions / Restrictions Precautions Precautions: Fall Precaution Comments: Impulsive; cognitive deficits at baseline Restrictions Weight Bearing Restrictions: No    Mobility  Bed Mobility Overal bed mobility: Needs Assistance Bed Mobility: Supine to Sit     Supine to sit: Min guard     General bed mobility comments: increased time, cueing for sequencing, min guard for safety  Transfers Overall transfer level: Needs assistance Equipment used: Rolling walker (2 wheeled) Transfers: Sit to/from Stand Sit to Stand: Min guard         General transfer comment: increased time, cueing for technique/hand placement, min A for stability with transition into standing from sitting EOB  Ambulation/Gait Ambulation/Gait assistance: Min assist Ambulation Distance  (Feet): 100 Feet (100' x2, 3' x1 with standing rest breaks) Assistive device: Rolling walker (2 wheeled) Gait Pattern/deviations: Step-through pattern;Ataxic;Scissoring;Trunk flexed;Drifts right/left Gait velocity: decreased Gait velocity interpretation: Below normal speed for age/gender General Gait Details: pt with modest instability requiring constant min A for support and for assist with safety with RW; pt very easily distracted by his environment and with decreased safety awareness   Stairs            Wheelchair Mobility    Modified Rankin (Stroke Patients Only) Modified Rankin (Stroke Patients Only) Pre-Morbid Rankin Score: Moderate disability Modified Rankin: Moderately severe disability     Balance Overall balance assessment: Needs assistance Sitting-balance support: No upper extremity supported;Feet supported Sitting balance-Leahy Scale: Fair Sitting balance - Comments: min guard for safety    Standing balance support: No upper extremity supported;During functional activity Standing balance-Leahy Scale: Fair Standing balance comment: static standing is fair with close min guard for safety                             Cognition Arousal/Alertness: Awake/alert Behavior During Therapy: Impulsive Overall Cognitive Status: History of cognitive impairments - at baseline Area of Impairment: Attention;Memory;Orientation;Following commands;Safety/judgement;Problem solving;Awareness                 Orientation Level: Disoriented to;Time Current Attention Level: Sustained Memory: Decreased short-term memory Following Commands: Follows one step commands inconsistently;Follows one step commands with increased time Safety/Judgement: Decreased awareness of safety;Decreased awareness of deficits Awareness: Intellectual Problem Solving: Slow processing;Difficulty sequencing;Requires verbal cues;Requires tactile cues        Exercises      General  Comments  Pertinent Vitals/Pain Pain Assessment: Faces Faces Pain Scale: Hurts little more Pain Location: back Pain Descriptors / Indicators: Sore;Guarding Pain Intervention(s): Monitored during session;Repositioned    Home Living                      Prior Function            PT Goals (current goals can now be found in the care plan section) Acute Rehab PT Goals PT Goal Formulation: With patient/family Time For Goal Achievement: 02/10/17 Potential to Achieve Goals: Good Progress towards PT goals: Progressing toward goals    Frequency    Min 4X/week      PT Plan Current plan remains appropriate    Co-evaluation              AM-PAC PT "6 Clicks" Daily Activity  Outcome Measure  Difficulty turning over in bed (including adjusting bedclothes, sheets and blankets)?: A Little Difficulty moving from lying on back to sitting on the side of the bed? : A Little Difficulty sitting down on and standing up from a chair with arms (e.g., wheelchair, bedside commode, etc,.)?: Unable Help needed moving to and from a bed to chair (including a wheelchair)?: A Little Help needed walking in hospital room?: A Little Help needed climbing 3-5 steps with a railing? : A Lot 6 Click Score: 15    End of Session Equipment Utilized During Treatment: Gait belt Activity Tolerance: Patient tolerated treatment well Patient left: in chair;with call bell/phone within reach;with chair alarm set Nurse Communication: Mobility status PT Visit Diagnosis: Unsteadiness on feet (R26.81);History of falling (Z91.81);Other symptoms and signs involving the nervous system (R29.898)     Time: 6599-3570 PT Time Calculation (min) (ACUTE ONLY): 19 min  Charges:  $Gait Training: 8-22 mins                    G Codes:       [place], [place], [place] 01/28/2017, 10:57 AM

## 2017-01-28 NOTE — H&P (Signed)
Physical Medicine and Rehabilitation Admission H&P    Chief Complaint  Patient presents with  . Code Stroke  : HPI: Ian Hill is a 66 y.o. right handed male with history of hypertension and mentally delayed. Patient lives with sister. History taken from chart review. One level home. Patient performs his own ADLs but does not drive. He does use a cane for ambulation. Presented 01/23/2017 with left-sided weakness facial droop and rightward eye deviation with left hemi-neglect. Cranial CT scan reviewed, no acute process. Per report, old right frontal lobe infarct. Patient did receive TPA. MRI brain showed cortical diffusion restriction throughout the posterior right MCA territory consistent with acute infarction. No hemorrhage or mass effect. CTA head and neck showed stenosis estimated 50% in both siphon regions. Cerebral angiogram occluded right MCA inferior and superior divisions. Underwent revascularization by interventional radiology. Echocardiogram with ejection fraction of 60%. Grade 2 Diastolic dysfunction.  Bouts of atrial fibrillation with rate control with mildly elevated troponin 0.19 and cardiology follow-up initial plan for TEE canceled.  Plan ongoing anticoagulation with Eliquis. Consider outpatient stress test.  Tolerating a regular consistency diet. Patient did have some initial agitation and restlessness and received Haldol. Blood sugars have all been acceptable. Hemoglobin A1c 5.7 and blood sugar checks discontinued. Physical and occupational therapy evaluation completed with recommendations of physical medicine rehabilitation consult. Patient was admitted for a comprehensive rehabilitation program  Review of Systems  Constitutional: Negative for chills and fever.  HENT: Negative for hearing loss.   Eyes: Negative for blurred vision and double vision.  Respiratory: Positive for cough. Negative for shortness of breath.   Cardiovascular: Negative for chest pain, palpitations  and leg swelling.  Gastrointestinal: Positive for constipation. Negative for nausea and vomiting.  Genitourinary: Positive for urgency.  Musculoskeletal: Positive for myalgias.  Skin: Negative for rash.  Neurological: Positive for focal weakness and weakness. Negative for seizures.  Psychiatric/Behavioral: Positive for memory loss.  All other systems reviewed and are negative.  Past Medical History:  Diagnosis Date  . Abnormal ECG   . Hypertensive heart disease    Past Surgical History:  Procedure Laterality Date  . IR PERCUTANEOUS ART THROMBECTOMY/INFUSION INTRACRANIAL INC DIAG ANGIO  01/23/2017  . RADIOLOGY WITH ANESTHESIA N/A 01/23/2017   Procedure: RADIOLOGY WITH ANESTHESIA;  Surgeon: Luanne Bras, MD;  Location: [facility];  Service: Radiology;  Laterality: N/A;   No family history on file. Social History:  has no tobacco, alcohol, and drug history on file. Allergies: Not on File Medications Prior to Admission  Medication Sig Dispense Refill  . atenolol (TENORMIN) 25 MG tablet Take 25 mg by mouth every morning.    . finasteride (PROSCAR) 5 MG tablet Take 5 mg by mouth daily.    . hydrochlorothiazide (HYDRODIURIL) 25 MG tablet Take 25 mg by mouth daily.    . simvastatin (ZOCOR) 40 MG tablet Take 40 mg by mouth every morning.    . tamsulosin (FLOMAX) 0.4 MG CAPS capsule Take 0.4 mg by mouth daily.       Drug Regimen Review  Drug regimen was reviewed and remains appropriate with no significant issues identified  Home: Home Living Family/patient expects to be discharged to:: Private residence Living Arrangements: Non-relatives/Friends (sister) Available Help at Discharge: Family, Available PRN/intermittently Type of Home: House Home Access: Stairs to enter [employer] of Steps: 6-7 Entrance Stairs-Rails: Right, Left Home Layout: One level Bathroom Shower/Tub: Tub/shower unit, Door Bathroom Toilet: Standard (higher) Bathroom Accessibility: Yes Home Equipment:  Cane - single point  Additional Comments: Home set up provided by famil;y  Lives With: Family (sister)   Functional History: Prior Function Level of Independence: Needs assistance Gait / Transfers Assistance Needed: No use of DME at baseline ADL's / Homemaking Assistance Needed: Sister reports that pt performs his own ADLs. Does not drive or write PTA.   Functional Status:  Mobility: Bed Mobility Overal bed mobility: Needs Assistance Bed Mobility: Supine to Sit Supine to sit: Min guard General bed mobility comments: Min GUard for safety. Bed with HOB and rails lowered Transfers Overall transfer level: Needs assistance Equipment used: Rolling walker (2 wheeled), 1 person hand held assist Transfers: Sit to/from Stand Sit to Stand: Min assist General transfer comment: From EOB, pt requiring Min A for safety and used RW to increase safety. During descent to recliner, used single hand held A and requiring Min A for safety Ambulation/Gait Ambulation/Gait assistance: Min assist Ambulation Distance (Feet): 120 Feet Assistive device: Rolling walker (2 wheeled) Gait Pattern/deviations: Step-through pattern, Ataxic, Scissoring, Trunk flexed, Drifts right/left General Gait Details: patient with inability to maintain balance during ambulation without assist. easily distracted by environment and poor ability to maintain safety and positioning with assitive device Gait velocity: decreased    ADL: ADL Overall ADL's : Needs assistance/impaired Eating/Feeding: Minimal assistance, Sitting Grooming: Moderate assistance, Standing, Cueing for safety, Cueing for sequencing Grooming Details (indicate cue type and reason): Requiring Min-Mod A to maintain standing posture as pt fatigued. Pt requiring cues for sequencing, safety, and to terminate task. Pt with decreased spacial awarness. Upper Body Bathing: Minimal assistance, Sitting Lower Body Bathing: Maximal assistance, Sit to/from stand Upper Body  Dressing : Minimal assistance, Sitting Upper Body Dressing Details (indicate cue type and reason): Donned new gown like jacket Lower Body Dressing: Maximal assistance, Sit to/from stand Toilet Transfer: Ambulation, RW, Minimal assistance (Simulated to recliner) Toilet Transfer Details (indicate cue type and reason): Mod A for safety Functional mobility during ADLs: Moderate assistance, Rolling walker (to sink with rw; to recliner with single hand held A) General ADL Comments: Pt demonstrating decreased fucntional performance due to poor safety awarness, cognititon, coorination, grasp strength, and balance. Pt also present with tactile sensativity for sensory input - feel this is possible at baseline.   Cognition: Cognition Overall Cognitive Status: History of cognitive impairments - at baseline Arousal/Alertness: Awake/alert Orientation Level: Oriented to person, Oriented to place, Disoriented to time Attention: Focused Focused Attention: Appears intact Behaviors: Restless Safety/Judgment: Impaired Cognition Arousal/Alertness: Awake/alert Behavior During Therapy: Restless Overall Cognitive Status: History of cognitive impairments - at baseline Area of Impairment: Orientation, Attention, Memory, Following commands, Safety/judgement, Awareness, Problem solving Orientation Level: Disoriented to, Time Current Attention Level: Sustained Memory: Decreased short-term memory Following Commands: Follows one step commands inconsistently, Follows one step commands with increased time Safety/Judgement: Decreased awareness of safety Awareness: Intellectual Problem Solving: Slow processing, Difficulty sequencing, Requires verbal cues, Requires tactile cues General Comments: patient pleasent throughout session but demonstrates some cognitive deficits and is easily distracted during task performance  Physical Exam: Blood pressure 112/61, pulse (!) 101, temperature 97.6 F (36.4 C), temperature  source Oral, resp. rate 18, height 5\' 10"  (1.778 m), weight 56 kg (123 lb 7.3 oz), SpO2 100 %. Physical Exam  Vitals reviewed. Constitutional: He appears well-developed.  Frail  HENT:  Head: Normocephalic and atraumatic.  Patient is edentulous  Eyes: EOM are normal. Right eye exhibits no discharge. Left eye exhibits no discharge. Scleral icterus is present.  Neck: Normal range of motion. Neck supple. No thyromegaly present.  Cardiovascular: Normal rate, regular rhythm and normal heart sounds.   Respiratory: Effort normal and breath sounds normal. No respiratory distress.  GI: Soft. Bowel sounds are normal. He exhibits no distension.  Musculoskeletal: He exhibits no edema or tenderness.  Diffuse muscle wasting  Neurological:  A&Ox2 Mild left facial droop.  Motor: Motor 5/5 RUE/RLE  4/5 LUE/LLE.  Dysarthria Limited insight and awareness  Skin: Skin is warm and dry.  Psychiatric: His affect is blunt. His speech is delayed. [name] slowed.     Results for orders placed or performed during the hospital encounter of 01/23/17 (from the past 48 hour(s))  Glucose, capillary     Status: Abnormal   Collection Time: 01/26/17  9:03 AM  Result Value Ref Range   Glucose-Capillary 59 (L) 65 - 99 mg/dL   Comment 1 Notify RN    Comment 2 Document in Chart   Troponin I     Status: Abnormal   Collection Time: 01/26/17  9:52 AM  Result Value Ref Range   Troponin I 0.07 (HH) <0.03 ng/mL    Comment: CRITICAL VALUE NOTED.  VALUE IS CONSISTENT WITH PREVIOUSLY REPORTED AND CALLED VALUE.  Glucose, capillary     Status: None   Collection Time: 01/26/17 10:38 AM  Result Value Ref Range   Glucose-Capillary 88 65 - 99 mg/dL  Glucose, capillary     Status: Abnormal   Collection Time: 01/26/17 11:30 AM  Result Value Ref Range   Glucose-Capillary 63 (L) 65 - 99 mg/dL   Comment 1 Notify RN    Comment 2 Document in Chart   Glucose, capillary     Status: None   Collection Time: 01/26/17  1:44 PM  Result  Value Ref Range   Glucose-Capillary 93 65 - 99 mg/dL  Glucose, capillary     Status: None   Collection Time: 01/26/17  4:06 PM  Result Value Ref Range   Glucose-Capillary 66 65 - 99 mg/dL   Comment 1 Notify RN    Comment 2 Document in Chart   Glucose, capillary     Status: None   Collection Time: 01/26/17  4:51 PM  Result Value Ref Range   Glucose-Capillary 71 65 - 99 mg/dL  Glucose, capillary     Status: None   Collection Time: 01/26/17  8:05 PM  Result Value Ref Range   Glucose-Capillary 80 65 - 99 mg/dL   Comment 1 Notify RN    Comment 2 Document in Chart   Glucose, capillary     Status: None   Collection Time: 01/27/17 12:57 AM  Result Value Ref Range   Glucose-Capillary 87 65 - 99 mg/dL   Comment 1 Notify RN    Comment 2 Document in Chart   Glucose, capillary     Status: None   Collection Time: 01/27/17  3:54 AM  Result Value Ref Range   Glucose-Capillary 89 65 - 99 mg/dL   Comment 1 Notify RN    Comment 2 Document in Chart   Glucose, capillary     Status: None   Collection Time: 01/27/17  7:44 AM  Result Value Ref Range   Glucose-Capillary 82 65 - 99 mg/dL  Glucose, capillary     Status: None   Collection Time: 01/27/17 11:22 AM  Result Value Ref Range   Glucose-Capillary 90 65 - 99 mg/dL  Glucose, capillary     Status: None   Collection Time: 01/27/17  4:26 PM  Result Value Ref Range   Glucose-Capillary 89 65 -  99 mg/dL  Glucose, capillary     Status: Abnormal   Collection Time: 01/27/17  8:21 PM  Result Value Ref Range   Glucose-Capillary 139 (H) 65 - 99 mg/dL   Comment 1 Notify RN    Comment 2 Document in Chart   Glucose, capillary     Status: Abnormal   Collection Time: 01/28/17 12:02 AM  Result Value Ref Range   Glucose-Capillary 48 (L) 65 - 99 mg/dL   Comment 1 Notify RN    Comment 2 Document in Chart   Glucose, capillary     Status: Abnormal   Collection Time: 01/28/17 12:34 AM  Result Value Ref Range   Glucose-Capillary 116 (H) 65 - 99 mg/dL    Glucose, capillary     Status: None   Collection Time: 01/28/17  4:25 AM  Result Value Ref Range   Glucose-Capillary 88 65 - 99 mg/dL   Comment 1 Notify RN    Comment 2 Document in Chart    Dg Chest Port 1 View  Result Date: 01/26/2017 CLINICAL DATA:  Fall.  Acute rib pain. EXAM: PORTABLE CHEST 1 VIEW COMPARISON:  Chest x-ray from yesterday FINDINGS: Limited study, reportedly the patient is combative. Extubation of the trachea and esophagus since yesterday. Remote right-sided rib fractures. No noted acute fracture. Postoperative left lung and mediastinum. There is no edema, consolidation, effusion, or pneumothorax. The trachea is deviated to the left due to volume loss. No thoracic inlet mass on recent CTA. IMPRESSION: Postoperative chest.  No evidence of active disease. Electronically Signed   By: Monte Fantasia M.D.   On: 01/26/2017 11:14    Medical Problem List and Plan: 1.  Left hemisensory deficits/left hemiparesis secondary to right MCA infarction/right M1 occlusion treated with IV TPA and mechanical embolectomy with revascularization 2.  DVT Prophylaxis/Anticoagulation: Eliquis 3. Pain Management: Tylenol as needed 4. Mood: Provide emotional support 5. Neuropsych: This patient is capable of making decisions on his own behalf. 6. Skin/Wound Care: Routine skin checks 7. Fluids/Electrolytes/Nutrition: Routine I&O with follow-up chemistries 8. PAF. Cardiac rate control. Follow-up cardiology services. 9. Hypertension. HCTZ 25 mg daily, Tenormin 50 mg daily. Monitor with increased mobility 10. BPH. Pro scar 5 mg daily, Flomax 0.4 mg daily. Check PVR 3 11. Hyperlipidemia. Zocor 12. Thrombocytopenia: Follow CBC  Post Admission Physician Evaluation: 1. Preadmission assessment reviewed and changes made below. 2. Functional deficits secondary  to right MCA infarction. 3. Patient is admitted to receive collaborative, interdisciplinary care between the physiatrist, rehab nursing staff, and  therapy team. 4. Patient's level of medical complexity and substantial therapy needs in context of that medical necessity cannot be provided at a lesser intensity of care such as a SNF. 5. Patient has experienced substantial functional loss from his/her baseline which was documented above under the "Functional History" and "Functional Status" headings.  Judging by the patient's diagnosis, physical exam, and functional history, the patient has potential for functional progress which will result in measurable gains while on inpatient rehab.  These gains will be of substantial and practical use upon discharge  in facilitating mobility and self-care at the household level. 6. Physiatrist will provide 24 hour management of medical needs as well as oversight of the therapy plan/treatment and provide guidance as appropriate regarding the interaction of the two. 7. 24 hour rehab nursing will assist with safety, disease management, medication administration and patient education  and help integrate therapy concepts, techniques,education, etc. 8. PT will assess and treat for/with: Lower extremity strength, range of  motion, stamina, balance, functional mobility, safety, adaptive techniques and equipment, coping skills, pain control, stroke education.   Goals are: Supervision. 9. OT will assess and treat for/with: ADL's, functional mobility, safety, upper extremity strength, adaptive techniques and equipment, ego support, and community reintegration.   Goals are: Supervision. Therapy may proceed with showering this patient. 10. SLP will assess and treat for/with: cognition.  Goals are: supervision/Min A. 11. Case Management and Social Worker will assess and treat for psychological issues and discharge planning. 12. Team conference will be held weekly to assess progress toward goals and to determine barriers to discharge. 13. Patient will receive at least 3 hours of therapy per day at least 5 days per week. 14. ELOS:  7-12 days.  15. Prognosis:  good  Delice Lesch, MD, ABPMR Lauraine Rinne J., PA-C 01/28/2017

## 2017-01-28 NOTE — Care Management Note (Signed)
Case Management Note  Patient Details  Name: Ian Hill MRN: [id number] Date of Birth: 03/22/1951  Subjective/Objective:                    Action/Plan: Pt discharging to CIR. No further needs per CM.  Expected Discharge Date:                  Expected Discharge Plan:  Skilled Nursing Facility  In-House Referral:  Clinical Social Work  Discharge planning Services  CM Consult  Post Acute Care Choice:    Choice offered to:     DME Arranged:    DME Agency:     HH Arranged:    [place] Agency:     Status of Service:  Completed, signed off  If discussed at [employer], dates discussed:    Additional Comments:  Pollie Friar, RN 01/28/2017, 4:36 PM

## 2017-01-28 NOTE — Progress Notes (Signed)
CM consulted for a benefits check for Eliquis. Pt has Medicaid. So his co pay runs about $3. Per his sister he usually pays a little over a $1 for each of his medications. CM provided his sister with a 30 day free card for the Eliquis. CM following.

## 2017-01-28 NOTE — Progress Notes (Signed)
Meredith Staggers, MD Physician Signed Physical Medicine and Rehabilitation  Consult Note Date of Service: 01/27/2017 2:56 PM  Related encounter: ED to Hosp-Admission (Current) from 01/23/2017 in [facility] All Collapse All   [] Hide copied text [] Hover for attribution information      Physical Medicine and Rehabilitation Consult Reason for Consult: Left-sided weakness with facial droop Referring Physician: Dr. Leonie Man   HPI: Ian Hill is a 66 y.o. right handed male with history of hypertension. Patient lives with sister. One level home. Patient performs his own ADLs but does not drive. He does use a cane for ambulation. Presented 01/23/2017 with left-sided weakness facial droop and rightward eye deviation with left hemi-neglect. Cranial CT scan showed old right frontal lobe infarct. Patient did receive TPA. MRI brain showed cortical diffusion restriction throughout the posterior right MCA territory consistent with acute infarction. No hemorrhage or mass effect. CTA head and neck showed stenosis estimated 50% in both siphon regions. Cerebral angiogram occluded right MCA inferior and superior divisions. Underwent revascularization by interventional radiology. Echocardiogram with ejection fraction of 60%. Grade 2 Diastolic dysfunction. Maintain on Elliquis for CVA prophylaxis. Bouts of atrial fibrillation cardiology follow-up initial plan for TEE canceled plan ongoing anticoagulation. Physical therapy evaluation completed with recommendations of physical medicine rehabilitation consult.   Review of Systems  Constitutional: Negative for chills and fever.  HENT: Negative for hearing loss.   Eyes: Negative for blurred vision and double vision.  Respiratory: Positive for cough.   Cardiovascular: Negative for chest pain and palpitations.  Gastrointestinal: Positive for constipation. Negative for nausea and vomiting.  Genitourinary: Positive  for urgency. Negative for dysuria and hematuria.  Musculoskeletal: Positive for joint pain and myalgias.  Skin: Negative for rash.  Neurological: Positive for weakness. Negative for seizures.  All other systems reviewed and are negative.      Past Medical History:  Diagnosis Date  . Abnormal ECG   . Hypertensive heart disease         Past Surgical History:  Procedure Laterality Date  . IR PERCUTANEOUS ART THROMBECTOMY/INFUSION INTRACRANIAL INC DIAG ANGIO  01/23/2017  . RADIOLOGY WITH ANESTHESIA N/A 01/23/2017   Procedure: RADIOLOGY WITH ANESTHESIA;  Surgeon: Luanne Bras, MD;  Location: [facility];  Service: Radiology;  Laterality: N/A;   No family history on file. Social History:  has no tobacco, alcohol, and drug history on file. Allergies: Not on File       Medications Prior to Admission  Medication Sig Dispense Refill  . atenolol (TENORMIN) 25 MG tablet Take 25 mg by mouth every morning.    . finasteride (PROSCAR) 5 MG tablet Take 5 mg by mouth daily.    . hydrochlorothiazide (HYDRODIURIL) 25 MG tablet Take 25 mg by mouth daily.    . simvastatin (ZOCOR) 40 MG tablet Take 40 mg by mouth every morning.    . tamsulosin (FLOMAX) 0.4 MG CAPS capsule Take 0.4 mg by mouth daily.       Home: Home Living Family/patient expects to be discharged to:: Private residence Living Arrangements: Non-relatives/Friends (sister) Available Help at Discharge: Family, Available PRN/intermittently Type of Home: House Home Access: Stairs to enter [employer] of Steps: 6-7 Entrance Stairs-Rails: Right, Left Home Layout: One level Bathroom Shower/Tub: Tub/shower unit, Door [employer] Toilet: Standard (higher) Bathroom Accessibility: Yes Home Equipment: [place] - single point Additional Comments: Home set up provided by famil;y  Lives With: Family (sister)  Functional History: Prior Function Level of Independence:  Needs assistance Gait / Transfers Assistance Needed:  No use of DME at baseline ADL's / Homemaking Assistance Needed: Sister reports that pt performs his own ADLs. Does not drive or write PTA.  Functional Status:  Mobility: Bed Mobility Overal bed mobility: Needs Assistance Bed Mobility: Supine to Sit Supine to sit: Min guard General bed mobility comments: Min GUard for safety. Bed with HOB and rails lowered Transfers Overall transfer level: Needs assistance Equipment used: Rolling walker (2 wheeled), 1 person hand held assist Transfers: Sit to/from Stand Sit to Stand: Min assist General transfer comment: From EOB, pt requiring Min A for safety and used RW to increase safety. During descent to recliner, used single hand held A and requiring Min A for safety Ambulation/Gait Ambulation/Gait assistance: Min assist Ambulation Distance (Feet): 120 Feet Assistive device: Rolling walker (2 wheeled) Gait Pattern/deviations: Step-through pattern, Ataxic, Scissoring, Trunk flexed, Drifts right/left General Gait Details: patient with inability to maintain balance during ambulation without assist. easily distracted by environment and poor ability to maintain safety and positioning with assitive device Gait velocity: decreased  ADL: ADL Overall ADL's : Needs assistance/impaired Eating/Feeding: Minimal assistance, Sitting Grooming: Moderate assistance, Standing, Cueing for safety, Cueing for sequencing Grooming Details (indicate cue type and reason): Requiring Min-Mod A to maintain standing posture as pt fatigued. Pt requiring cues for sequencing, safety, and to terminate task. Pt with decreased spacial awarness. Upper Body Bathing: Minimal assistance, Sitting Lower Body Bathing: Maximal assistance, Sit to/from stand Upper Body Dressing : Minimal assistance, Sitting Upper Body Dressing Details (indicate cue type and reason): Donned new gown like jacket Lower Body Dressing: Maximal assistance, Sit to/from stand Toilet Transfer: Ambulation, RW,  Minimal assistance (Simulated to recliner) Toilet Transfer Details (indicate cue type and reason): Mod A for safety Functional mobility during ADLs: Moderate assistance, Rolling walker (to sink with rw; to recliner with single hand held A) General ADL Comments: Pt demonstrating decreased fucntional performance due to poor safety awarness, cognititon, coorination, grasp strength, and balance. Pt also present with tactile sensativity for sensory input - feel this is possible at baseline.   Cognition: Cognition Overall Cognitive Status: History of cognitive impairments - at baseline Arousal/Alertness: Awake/alert Orientation Level: Oriented to person, Oriented to place, Disoriented to time Attention: Focused Focused Attention: Appears intact Behaviors: Restless Safety/Judgment: Impaired Cognition Arousal/Alertness: Awake/alert Behavior During Therapy: Restless Overall Cognitive Status: History of cognitive impairments - at baseline Area of Impairment: Orientation, Attention, Memory, Following commands, Safety/judgement, Awareness, Problem solving Orientation Level: Disoriented to, Time Current Attention Level: Sustained Memory: Decreased short-term memory Following Commands: Follows one step commands inconsistently, Follows one step commands with increased time Safety/Judgement: Decreased awareness of safety Awareness: Intellectual Problem Solving: Slow processing, Difficulty sequencing, Requires verbal cues, Requires tactile cues General Comments: patient pleasent throughout session but demonstrates some cognitive deficits and is easily distracted during task performance  Blood pressure 106/60, pulse 80, temperature 97.7 F (36.5 C), temperature source Oral, resp. rate 18, height 5\' 10"  (1.778 m), weight 56 kg (123 lb 7.3 oz), SpO2 100 %. Physical Exam  Vitals reviewed. HENT:  Mild left facial droop. Patient is edentulous  Eyes: EOM are normal.  Neck: Normal range of motion. Neck  supple. No thyromegaly present.  Cardiovascular:  Cardiac rate controlled  Respiratory: Effort normal and breath sounds normal. No respiratory distress.  GI: Soft. Bowel sounds are normal. He exhibits no distension.  Neurological: [name] alert.    good eye contact with examiner. Provide his name and age but needed some cues  for date of birth. Follows simple commands but perseverates on certain tasks. Left inattention and decreased LT and pain sense of left arm and leg. MOtor 5/5 RUE and RLE and 4+/5 LUE and LLE. Speech fairly clear. Limited insight and awareness.   Skin: Skin is warm and dry.    Lab Results Last 24 Hours       Results for orders placed or performed during the hospital encounter of 01/23/17 (from the past 24 hour(s))  Glucose, capillary     Status: None   Collection Time: 01/26/17  4:06 PM  Result Value Ref Range   Glucose-Capillary 66 65 - 99 mg/dL   Comment 1 Notify RN    Comment 2 Document in Chart   Glucose, capillary     Status: None   Collection Time: 01/26/17  4:51 PM  Result Value Ref Range   Glucose-Capillary 71 65 - 99 mg/dL  Glucose, capillary     Status: None   Collection Time: 01/26/17  8:05 PM  Result Value Ref Range   Glucose-Capillary 80 65 - 99 mg/dL   Comment 1 Notify RN    Comment 2 Document in Chart   Glucose, capillary     Status: None   Collection Time: 01/27/17 12:57 AM  Result Value Ref Range   Glucose-Capillary 87 65 - 99 mg/dL   Comment 1 Notify RN    Comment 2 Document in Chart   Glucose, capillary     Status: None   Collection Time: 01/27/17  3:54 AM  Result Value Ref Range   Glucose-Capillary 89 65 - 99 mg/dL   Comment 1 Notify RN    Comment 2 Document in Chart   Glucose, capillary     Status: None   Collection Time: 01/27/17  7:44 AM  Result Value Ref Range   Glucose-Capillary 82 65 - 99 mg/dL  Glucose, capillary     Status: None   Collection Time: 01/27/17 11:22 AM  Result Value Ref Range    Glucose-Capillary 90 65 - 99 mg/dL      Imaging Results (Last 48 hours)  Dg Chest Port 1 View  Result Date: 01/26/2017 CLINICAL DATA:  Fall.  Acute rib pain. EXAM: PORTABLE CHEST 1 VIEW COMPARISON:  Chest x-ray from yesterday FINDINGS: Limited study, reportedly the patient is combative. Extubation of the trachea and esophagus since yesterday. Remote right-sided rib fractures. No noted acute fracture. Postoperative left lung and mediastinum. There is no edema, consolidation, effusion, or pneumothorax. The trachea is deviated to the left due to volume loss. No thoracic inlet mass on recent CTA. IMPRESSION: Postoperative chest.  No evidence of active disease. Electronically Signed   By: Monte Fantasia M.D.   On: 01/26/2017 11:14     Assessment/Plan: Diagnosis: Right MCA infarct with left visual spatial deficits as well as left hemisensory deficits/left hemiparesis 1. Does the need for close, 24 hr/day medical supervision in concert with the patient's rehab needs make it unreasonable for this patient to be served in a less intensive setting? Yes 2. Co-Morbidities requiring supervision/potential complications: HTN, CKD3 3. Due to bladder management, bowel management, safety, skin/wound care, disease management, medication administration, pain management and patient education, does the patient require 24 hr/day rehab nursing? Yes 4. Does the patient require coordinated care of a physician, rehab nurse, PT (1-2 hrs/day, 5 days/week), OT (1-2 hrs/day, 5 days/week) and SLP (1-2 hrs/day, 5 days/week) to address physical and functional deficits in the context of the above medical diagnosis(es)? Yes Addressing deficits  in the following areas: balance, endurance, locomotion, strength, transferring, bowel/bladder control, bathing, dressing, feeding, grooming, toileting, cognition and psychosocial support 5. Can the patient actively participate in an intensive therapy program of at least 3 hrs of therapy per  day at least 5 days per week? Yes 6. The potential for patient to make measurable gains while on inpatient rehab is excellent 7. Anticipated functional outcomes upon discharge from inpatient rehab are modified independent and supervision  with PT, modified independent and supervision with OT, modified independent and supervision with SLP. 8. Estimated rehab length of stay to reach the above functional goals is: 7-10 days 9. Anticipated D/C setting: Home 10. Anticipated post D/C treatments: HH therapy and Outpatient therapy 11. Overall Rehab/Functional Prognosis: excellent  RECOMMENDATIONS: This patient's condition is appropriate for continued rehabilitative care in the following setting: CIR Patient has agreed to participate in recommended program. Yes Note that insurance prior authorization may be required for reimbursement for recommended care.  Comment: Rehab Admissions Coordinator to follow up.  Thanks,  Meredith Staggers, MD, [facility] Drown    Cathlyn Parsons., PA-C 01/27/2017    Revision History                        Routing History

## 2017-01-28 NOTE — Discharge Summary (Signed)
Stroke Discharge Summary  Patient ID: Ian Hill   MRN: [id number]      DOB: Feb 26, 1951  Date of Admission: 01/23/2017 Date of Discharge: 01/28/2017  Attending Physician:  Garvin Fila, MD, Stroke MD Consultant(s):   cardiology and Rehabilitation Medicine Patient's PCP:  [name], MD  Discharge Diagnoses: Principal Problem:   Cerebrovascular accident (CVA) due to occlusion of right middle cerebral artery ([place]) - Right middle cerebral artery branch infarct do to right M1 occlusion treated with IV TPA and mechanical embolectomy due to new onset atrial fibrillation Active Problems:   Acute right arterial ischemic stroke, middle cerebral artery (MCA) ([place])   Acute respiratory failure ([place])   Essential hypertension   Acute metabolic encephalopathy   Endotracheal tube present   Stage 3 chronic kidney disease   Protein-calorie malnutrition, severe  Past Medical History:  Diagnosis Date  . Abnormal ECG   . Hypertensive heart disease    Past Surgical History:  Procedure Laterality Date  . IR PERCUTANEOUS ART THROMBECTOMY/INFUSION INTRACRANIAL INC DIAG ANGIO  01/23/2017  . RADIOLOGY WITH ANESTHESIA N/A 01/23/2017   Procedure: RADIOLOGY WITH ANESTHESIA;  Surgeon: Luanne Bras, MD;  Location: [facility];  Service: Radiology;  Laterality: N/A;    Medications to be continued on Rehab . apixaban  5 mg Oral BID  . atenolol  50 mg Oral Daily  . chlorhexidine  15 mL Mouth Rinse BID  . finasteride  5 mg Oral Daily  . hydrochlorothiazide  25 mg Oral Daily  . simvastatin  40 mg Oral q1800  . tamsulosin  0.4 mg Oral Daily    LABORATORY STUDIES CBC    Component Value Date/Time   WBC 7.2 01/25/2017 0023   RBC 3.89 (L) 01/25/2017 0023   HGB 11.3 (L) 01/25/2017 0023   HCT 34.5 (L) 01/25/2017 0023   PLT 119 (L) 01/25/2017 0023   MCV 88.7 01/25/2017 0023   MCH 29.0 01/25/2017 0023   MCHC 32.8 01/25/2017 0023   RDW 15.9 (H) 01/25/2017 0023   LYMPHSABS 1.1 01/25/2017 0023    MONOABS 0.5 01/25/2017 0023   EOSABS 0.1 01/25/2017 0023   BASOSABS 0.0 01/25/2017 0023   CMP    Component Value Date/Time   NA 140 01/26/2017 0201   K 4.2 01/26/2017 0201   CL 106 01/26/2017 0201   CO2 26 01/26/2017 0201   GLUCOSE 106 (H) 01/26/2017 0201   BUN 15 01/26/2017 0201   CREATININE 1.20 01/26/2017 0201   CALCIUM 8.9 01/26/2017 0201   PROT 6.8 01/23/2017 1803   ALBUMIN 3.9 01/23/2017 1803   AST 48 (H) 01/23/2017 1803   ALT 42 01/23/2017 1803   ALKPHOS 86 01/23/2017 1803   BILITOT 0.8 01/23/2017 1803   GFRNONAA >60 01/26/2017 0201   GFRAA >60 01/26/2017 0201   COAGS Lab Results  Component Value Date   INR 1.04 01/23/2017   Lipid Panel    Component Value Date/Time   CHOL 135 01/24/2017 0604   TRIG 93 01/24/2017 0604   HDL 48 01/24/2017 0604   CHOLHDL 2.8 01/24/2017 0604   VLDL 19 01/24/2017 0604   LDLCALC 68 01/24/2017 0604   HgbA1C  Lab Results  Component Value Date   HGBA1C 5.7 (H) 01/24/2017   Urinalysis No results found for: COLORURINE, APPEARANCEUR, LABSPEC, PHURINE, GLUCOSEU, HGBUR, BILIRUBINUR, KETONESUR, PROTEINUR, UROBILINOGEN, NITRITE, LEUKOCYTESUR Urine Drug Screen     Component Value Date/Time   LABOPIA NONE DETECTED 01/24/2017 [place] DETECTED 01/24/2017 1043  LABBENZ NONE DETECTED 01/24/2017 1043   AMPHETMU NONE DETECTED 01/24/2017 1043   THCU NONE DETECTED 01/24/2017 1043   LABBARB NONE DETECTED 01/24/2017 1043    Alcohol Level No results found for: [facility]   SIGNIFICANT DIAGNOSTIC STUDIES Ct Angio Head W Or Wo Contrast Ct Angio Neck W And/or Wo Contrast 01/23/2017 IMPRESSION:  Atherosclerotic disease at both carotid bifurcations. No stenosis on the left. 25% stenosis on the right. Advanced atherosclerotic disease throughout both carotid siphon regions. Stenosis estimated at 50% in both siphon regions. No flow demonstrated in the superior division right M2 branches, consistent with the hyperdense vessel seen at  head CT. The patient has had old infarction in the right frontal lobe, but I would assume today's findings or acute. Proximal right vertebral artery stenosis, maximal at 50% 1 cm beyond the vessel origin. Nonvisualization of the distal right vertebral artery which appears to be thrombosed or occluded by embolic disease. The left vertebral artery is the dominant vessel and is widely patent to the basilar. The exact age of the right vertebral artery occlusion cannot be establish with certainty.   Ct Head Wo Contrast 01/23/2017 IMPRESSION:  1. Minimal contrast staining adjacent to the M1 segment of the right middle cerebral artery status post intervention. No intraparenchymal hematoma or extra-axial collection.  2. Old right frontal lobe infarct.   Dg Chest Port 1 View 01/23/2017 IMPRESSION:  Tube positions as described without pneumothorax. Postoperative change on the left. Bibasilar atelectasis. No edema or consolidation. Heart mildly enlarged. There is aortic atherosclerosis. Aortic Atherosclerosis   Ct Head Code Stroke Wo Contrast 01/23/2017   IMPRESSION:  1. No acute hemorrhage or mass lesion.  2. Hyperdense appearance of the right middle cerebral artery M2/M3 segments may indicate acute thrombus, particularly in the setting of left-sided weakness. Correlation with CTA recommended.  3. Old right frontal infarct.  4. ASPECTS is 10.   Transthoracic Echocardiogram 01/24/2017 Left ventricle: The cavity size was normal. There was hypertrophy of the apex. The appearance was consistent with apical varianthypertrophic cardiomyopathy. Systolic function was normal. Theestimated ejection fraction was in the range of 55% to 60%. Therewas no dynamic obstruction. Wall motion was normal; there were noregional wall motion abnormalities  CEREBRAL ANGIOGRAM 01/23/2017 S/P RT common carotid arteriogram followed complete revascularization of occluded RT MCA inferior and sup divisions with x 1 pass  with 36mm x 40 mm solitaire FR retrieval device achieving a TICI 3 reperfusion   HISTORY OF PRESENT ILLNESS Ian Andrewsis an 66 y.o.malewho presents via EMS after wife heard him fall in the bathroom and then noted him to be confused and weak on his left side. Code Stroke was called on arrival to the ED.   Pt was observed to be in sustained atrial fibrillation during this admission.  Cardiology was consulted, and the pt was started on Eliquis for anticoagulation.  LSN:5:20 PM NIHSS = 19 tPA Given:Yes - Friday 01/23/17 at [facility] Ian Hill is a 66 y.o. male with history of hypertension and recent prostate surgery presenting with left sided weakness and confusion.   Given tPA Friday 01/23/17 at 1815 s/p mechanical thrombectomy    Stroke:   Right middle cerebral artery branch infarct do to right M1 occlusion treated with IV TPA and mechanical embolectomy and revascularization  Resultant  Left hemiplegia  CT head - Minimal contrast staining adjacent to the M1 segment of the R MCA status post intervention.  MRI head -1. Cortical diffusion restriction throughout the posterior right MCA  territory, consistent with acute infarct. No hemorrhage or mass effect. 2. Punctate foci of acute ischemia within the superior right frontal lobe and within the left occipital lobe. Distribution within multiple vascular territories suggests origin of thrombus from a central source within the heart or ascending aorta. 3. Old right frontal lobe infarct and findings of chronic  hypertensive microangiopathy.MRA head - not performed  Carotid Doppler  - CTA neck  2D Echo - Left ventricle: The cavity size was normal. There was hypertrophy of the apex. The appearance was consistent with apical variant hypertrophic cardiomyopathy. Systolic function was normal. Theestimated ejection fraction was in the range of 55% to 60%. Therewas no dynamic obstruction. Wall motion was normal; there were  noregional wall motion abnormalities  LDL 68  HgbA1c - 5.7  VTE prophylaxis - SCDs  Diet NPO time specified Except for: Sips with Meds  No antithrombotic prior to admission, now on No antithrombotic S/P TPA  Patient will be counseled to be compliant with his antithrombotic medications  Ongoing aggressive stroke risk factor management  Therapy recommendations: - CIR  Disposition: discharge to CIR  Hypertension  Stable  Permissive hypertension (OK if < 220/120) but gradually normalize in 5-7 days  Long-term BP goal normotensive  Hyperlipidemia  Home meds:  Zocor 40 mg daily prior to admission  LDL 68, goal < 70  Will resume Zocor  Continue statin at discharge  Other Stroke Risk Factors  Advanced age  Coronary artery disease  Other Active Problems  Elevated troponin - 0.19 -> cardiology consult  Abnormal ECG - SR rate 75 BPM - LVH - repeat this AM -> sinus bradycardia rate 59 bpm with LVH  Mild anemia  DISCHARGE EXAM Blood pressure 108/70, pulse 88, temperature 98.8 F (37.1 C), temperature source Oral, resp. rate 18, height 5\' 10"  (1.778 m), weight 56 kg (123 lb 7.3 oz), SpO2 99 %.  Awake and alert speech is nonfluent slightly hesitant but clear.. Follows commands quite well.. Right gaze preference but able to look to the left on command. Left homonymous hemianopsia.. Pupils small reactive. Fundi not visualized. Does not blink to threat on either side.mild left lower facial weakness. Tongue midline. Moves all 4 extremities against gravity but mild left hemiparesis 4/5  with weakness of left grip and intrinsic hand muscles and left hip flexors. Plantars downgoing. Gait not tested.  Discharge Diet  Diet Carb Modified Fluid consistency: Thin; Room service appropriate? Yes liquids  DISCHARGE PLAN  Disposition:  Transfer to [facility] for ongoing PT, OT and ST  Eliquis (apixaban) daily for secondary stroke prevention.  Recommend  ongoing risk factor control by Primary Care Physician at time of discharge from inpatient rehabilitation.  Follow-up [name], MD in 2 weeks following discharge from rehab.  Follow-up with Dr. Rosalin Hawking, Stroke Clinic in 6 weeks, office to schedule an appointment.   40 minutes were spent preparing discharge.  I have personally examined this patient, reviewed notes, independently viewed imaging studies, participated in medical decision making and plan of care.ROS completed by me personally and pertinent positives fully documented  I have made any additions or clarifications directly to the above note. Agree with note above.   Antony Contras, MD Medical Director [facility] Pager: [phone] 01/28/2017 5:05 PM

## 2017-01-28 NOTE — Progress Notes (Signed)
[facility] Admission Coordinator Signed Physical Medicine and Rehabilitation  PMR Pre-admission Date of Service: 01/28/2017 2:27 PM  Related encounter: ED to Hosp-Admission (Current) from 01/23/2017 in [place]       [] Hide copied text PMR Admission Coordinator Pre-Admission Assessment  Patient: Ian Hill is an 66 y.o., male MRN: [id number] DOB: Ian Hill Height: 5\' 10"  (177.8 cm) (Estimated w/ tape measure while Reverse Trndl) Weight: 56 kg (123 lb 7.3 oz)                                                                                                                                                  Insurance Information HMO: X    PPO:      PCP:      IPA:      80/20:      OTHER:  PRIMARY: U[employer] Medicare       Policy#: [id number]      Subscriber: Self CM Name: Vevelyn Royals      Phone#: [phone]     Fax#: [phone] Pre-Cert#: [id number] 12/20/15-09/19/23      Employer:  Benefits:  Phone #: [phone]     Name: Verified online  Eff. Date: 01/10/17     Deduct: $0      Out of Pocket Max: [phone]      Life Max: None CIR: $0 a day, for up to 90 days      SNF: $0 a day, days 1-90 and then 90 lifetime days that could be used if needed Outpatient: PT/OT/SLP     Co-Pay: $0 Home Health: PT/OT/SLP      Co-Pay: $0 DME: 80%     Co-Pay: 20% Providers: In-network  SECONDARY: Medicaid of [place]      Policy#: [id number]      Subscriber: Self CM Name:       Phone#:      Fax#:  Pre-Cert#: Coverage code: [facility]      Employer:  Benefits:  Phone #: [phone]     Name: Automated system  Eff. Date: Eligible as of 01/28/17     Deduct:       Out of Pocket Max:       Life Max:  CIR:       SNF:  Outpatient:      Co-Pay:  Home Health:       Co-Pay:  DME:      Co-Pay:   Medicaid Application Date:       Case Manager:  Disability Application Date:       Case Worker:   Emergency Contact Information        Contact Information    Name Relation Home Work Mobile    [place] Sister [phone]  [phone]     Current Medical History  Patient Admitting Diagnosis: Right MCA infarct with left visual spatial  deficits as well as left hemisensory deficits/left hemiparesis  History of Present Illness: Zavien Andrewsis a 66 y.o.right handed malewith history of hypertension and mentally delayed. Patient lives with sister. One level home. Patient performs his own ADLs but does not drive. He does use a cane for ambulation. Presented 01/23/2017 with left-sided weakness facial droop and rightward eye deviation with left hemi-neglect. Cranial CT scan showed old right frontal lobe infarct. Patient did receive TPA. MRI brain showed cortical diffusion restriction throughout the posterior right MCA territory consistent with acute infarction. No hemorrhage or mass effect. CTA head and neck showed stenosis estimated 50% in both siphon regions. Cerebral angiogram occluded right MCA inferior and superior divisions. Underwent revascularization by interventional radiology. Echocardiogram with ejection fraction of 60%. Grade 2 Diastolic dysfunction. Bouts of atrial fibrillation with rate control with mildly elevated troponin 0.19 andcardiology follow-up initial plan for TEE canceled plan ongoing anticoagulation with Eliquis.Consider plan for outpatient stress test. tolerating a regular consistency diet. Patient did have some initial agitation and restlessness and received Haldol. Blood sugars have all been acceptable hemoglobin A1c 5.7 and blood sugar checks discontinued. Physical and occupational therapy evaluation completed with recommendations of physical medicine rehabilitation consult.Patient was admitted for a comprehensive rehabilitation program 01/28/17.  NIH Total: 4  Past Medical History  Past Medical History:  Diagnosis Date  . Abnormal ECG   . Hypertensive heart disease     Family History  family history is not on file.  Prior  Rehab/Hospitalizations:  Has the patient had major surgery during 100 days prior to admission? No  Current Medications   Current Facility-Administered Medications:  .  apixaban (ELIQUIS) tablet 5 mg, 5 mg, Oral, BID, Garvin Fila, MD, 5 mg at 01/28/17 1000 .  atenolol (TENORMIN) tablet 50 mg, 50 mg, Oral, Daily, Patteson, Samuel A, NP, 50 mg at 01/28/17 1000 .  chlorhexidine (PERIDEX) 0.12 % solution 15 mL, 15 mL, Mouth Rinse, BID, Garvin Fila, MD, 15 mL at 01/28/17 0800 .  finasteride (PROSCAR) tablet 5 mg, 5 mg, Oral, Daily, Patteson, Samuel A, NP, 5 mg at 01/28/17 1000 .  hydrochlorothiazide (HYDRODIURIL) tablet 25 mg, 25 mg, Oral, Daily, Patteson, Samuel A, NP, 25 mg at 01/28/17 1000 .  polyvinyl alcohol (LIQUIFILM TEARS) 1.4 % ophthalmic solution 1 drop, 1 drop, Both Eyes, PRN, Garvin Fila, MD .  simvastatin (ZOCOR) tablet 40 mg, 40 mg, Oral, q1800, Rinehuls, David L, PA-C, 40 mg at 01/27/17 1848 .  tamsulosin (FLOMAX) capsule 0.4 mg, 0.4 mg, Oral, Daily, Patteson, Samuel A, NP, 0.4 mg at 01/28/17 1000  Patients Current Diet: Diet Carb Modified Fluid consistency: Thin; Room service appropriate? Yes  Precautions / Restrictions Precautions Precautions: Fall Precaution Comments: Impulsive; cognitive deficits at baseline Restrictions Weight Bearing Restrictions: No   Has the patient had 2 or more falls or a fall with injury in the past year?No, 1 fall just prior to admission without injury  Prior Activity Level Community (5-7x/wk): Prior to admission the patient was out walking in the community almost daily.  He enjoyed walking around the block with a single point cane and watching TV.  He was fully independent with basic self-care tasks and his sister, who he lives with assists with IADLs.    Home Assistive Devices / Equipment Home Equipment: Kasandra Knudsen - single point  Prior Device Use: Indicate devices/aids used by the patient prior to current illness, exacerbation or  injury? Single point cane when out walking in the community   Prior Functional  Level Prior Function Level of Independence: Needs assistance Gait / Transfers Assistance Needed: No use of DME at baseline ADL's / Homemaking Assistance Needed: Sister reports that pt performs his own ADLs. Does not drive or write PTA.   Self Care: Did the patient need help bathing, dressing, using the toilet or eating?Independent  Indoor Mobility: Did the patient need assistance with walking from room to room (with or without device)? Independent  Stairs: Did the patient need assistance with internal or external stairs (with or without device)? Independent  Functional Cognition: Did the patient need help planning regular tasks such as shopping or remembering to take medications? Dependent  Current Functional Level Cognition  Arousal/Alertness: Awake/alert Overall Cognitive Status: History of cognitive impairments - at baseline Current Attention Level: Sustained Orientation Level: Oriented X4 Following Commands: Follows one step commands inconsistently, Follows one step commands with increased time Safety/Judgement: Decreased awareness of safety, Decreased awareness of deficits General Comments: patient pleasent throughout session but demonstrates some cognitive deficits and is easily distracted during task performance Attention: Focused Focused Attention: Appears intact Behaviors: Restless Safety/Judgment: Impaired    Extremity Assessment (includes Sensation/Coordination)  Upper Extremity Assessment: Defer to OT evaluation RUE Deficits / Details: Good strength in BUEs. Poor coorination and control. Pt would overshoort/undershoot targets during grooming task at sink. Pt demosntrating decreased grasps strength and would drop items throughout task RUE Coordination: decreased fine motor, decreased gross motor LUE Deficits / Details: Good strength in BUEs. Poor coorination and control. Pt would  overshoort/undershoot targets during grooming task at sink. Pt demosntrating decreased grasps strength and would drop items throughout task LUE Coordination: decreased fine motor, decreased gross motor  Lower Extremity Assessment: Overall WFL for tasks assessed (bilateral coordination deficits with functional tasks)    ADLs  Overall ADL's : Needs assistance/impaired Eating/Feeding: Minimal assistance, Sitting Grooming: Moderate assistance, Standing, Cueing for safety, Cueing for sequencing Grooming Details (indicate cue type and reason): Requiring Min-Mod A to maintain standing posture as pt fatigued. Pt requiring cues for sequencing, safety, and to terminate task. Pt with decreased spacial awarness. Upper Body Bathing: Minimal assistance, Sitting Lower Body Bathing: Maximal assistance, Sit to/from stand Upper Body Dressing : Minimal assistance, Sitting Upper Body Dressing Details (indicate cue type and reason): Donned new gown like jacket Lower Body Dressing: Maximal assistance, Sit to/from stand Toilet Transfer: Ambulation, RW, Minimal assistance (Simulated to recliner) Toilet Transfer Details (indicate cue type and reason): Mod A for safety Functional mobility during ADLs: Moderate assistance, Rolling walker (to sink with rw; to recliner with single hand held A) General ADL Comments: Pt demonstrating decreased fucntional performance due to poor safety awarness, cognititon, coorination, grasp strength, and balance. Pt also present with tactile sensativity for sensory input - feel this is possible at baseline.     Mobility  Overal bed mobility: Needs Assistance Bed Mobility: Supine to Sit Supine to sit: Min guard General bed mobility comments: increased time, cueing for sequencing, min guard for safety    Transfers  Overall transfer level: Needs assistance Equipment used: Rolling walker (2 wheeled) Transfers: Sit to/from Stand Sit to Stand: Min guard General transfer comment:  increased time, cueing for technique/hand placement, min A for stability with transition into standing from sitting EOB    Ambulation / Gait / Stairs / Wheelchair Mobility  Ambulation/Gait Ambulation/Gait assistance: Museum/gallery curator (Feet): 100 Feet (100' x2, 33' x1 with standing rest breaks) Assistive device: Rolling walker (2 wheeled) Gait Pattern/deviations: Step-through pattern, Ataxic, Scissoring, Trunk flexed, Drifts right/left General Gait  Details: pt with modest instability requiring constant min A for support and for assist with safety with RW; pt very easily distracted by his environment and with decreased safety awareness Gait velocity: decreased Gait velocity interpretation: Below normal speed for age/gender    Posture / Balance Dynamic Sitting Balance Sitting balance - Comments: min guard for safety  Balance Overall balance assessment: Needs assistance Sitting-balance support: No upper extremity supported, Feet supported Sitting balance-Leahy Scale: Fair Sitting balance - Comments: min guard for safety  Standing balance support: No upper extremity supported, During functional activity Standing balance-Leahy Scale: Fair Standing balance comment: static standing is fair with close min guard for safety  High level balance activites: Direction changes, Turns, Head turns High Level Balance Comments: moderate assist significant overt LOB with tasks    Special needs/care consideration BiPAP/CPAP: No CPM: No Continuous Drip IV: No Dialysis: No         Life Vest: No Oxygen: No Special Bed: No, but is a candidate for the low bed  Trach Size: No Wound Vac (area): No       Skin: Dry, cracked feet bilaterally                                Bowel mgmt: Continent, last BM 01/28/17 Bladder mgmt: Incontinent with foley placed 01/23/17 Diabetic mgmt: HgbA1c 5.7     Previous Home Environment Living Arrangements: Non-relatives/Friends (sister)  Lives With:  Family (sister) Available Help at Discharge: Family, Available PRN/intermittently Type of Home: House Home Layout: One level Home Access: Stairs to enter Entrance Stairs-Rails: Right, Left Entrance Stairs-Number of Steps: 6-7 Bathroom Shower/Tub: Tub/shower unit, Door [employer]: Standard (higher) Bathroom Accessibility: Yes How Accessible: Accessible via walker Additional Comments: Home set up provided by famil;y  Discharge Living Setting Plans for Discharge Living Setting: Patient's home, Lives with (comment) (Sister: Ian Hill, cell: [phone]) Type of Home at Discharge: House Discharge Home Layout: One level Discharge Home Access: Stairs to enter Entrance Stairs-Rails: Can reach both Entrance Stairs-Number of Steps: 2 or 6-7 Discharge Bathroom Shower/Tub: Tub/shower unit, Door Discharge Bathroom Toilet: Standard Discharge Bathroom Accessibility: Yes How Accessible: Accessible via walker Does the patient have any problems obtaining your medications?: No  Social/Family/Support Systems Patient Roles: Other (Comment) (Sibling) Contact Information: Sister: Ian Hill, cell: [phone] Anticipated Caregiver: Family  Anticipated Caregiver's Contact Information: see above Ability/Limitations of Caregiver: available PRN Caregiver Availability: Other (Comment) (PRN) Discharge Plan Discussed with Primary Caregiver: Yes Is Caregiver In Agreement with Plan?: Yes Does Caregiver/Family have Issues with Lodging/Transportation while Pt is in Rehab?: No  Goals/Additional Needs Patient/Family Goal for Rehab: PT/OT/SLP Mod I-Supervision  Expected length of stay: 7-10 days Cultural Considerations: None Dietary Needs: Carb. Mod. diet restrictions  Equipment Needs: TBD Additional Information: Unable to write at baseline and sister took care of household tasks Pt/Family Agrees to Admission and willing to participate: Yes Program Orientation Provided & Reviewed with  Pt/Caregiver Including Roles  & Responsibilities: Yes Additional Information Needs: None Information Needs to be Provided By: N/A  Decrease burden of Care through IP rehab admission: No  Possible need for SNF placement upon discharge: No  Patient Condition: This patient's condition remains as documented in the consult dated 01/27/17 at 4:58 pm, in which the Rehabilitation Physician determined and documented that the patient's condition is appropriate for intensive rehabilitative care in an inpatient rehabilitation facility. Will admit to inpatient rehab today.  Preadmission Screen Completed By:  Lenna Sciara  Gertie Fey, 01/28/2017 2:27 PM ______________________________________________________________________   Discussed status with Dr. Posey Pronto on 01/28/17 at 1435 and received telephone approval for admission today.  Admission Coordinator:  Gunnar Fusi, time 1435/Date 01/28/17       Cosigned by: Jamse Arn, MD at 01/28/2017 2:54 PM  Revision History

## 2017-01-28 NOTE — Progress Notes (Signed)
Pt transferred to 4W25 for rehab. Report given to nurse, Santiago Glad.

## 2017-01-28 NOTE — Progress Notes (Signed)
Pt arrived to unit via bed, alert to self and situation only, able to make simple needs known, sister not at bedside during admission and difficult for pt to answer current medications at home and physician information as well as past history.  Pt reports that sister takes care of him since passing of his father.  Pt c/o of lower back pain with position change, however, resolved without medication, SR up x 3, call bell in reach

## 2017-01-28 NOTE — Progress Notes (Signed)
Inpatient Rehabilitation  Met with patient to discuss team's recommendation for IP Rehab.  Shared booklets and answered questions.  Patient wants to regain his independence and is looking forward to IP Rehab.  I have initiated insurance authorization and await a decision.  Will follow up with team later in hopes of potentially admitting patient today.  Please call with questions.   Carmelia Roller., CCC/SLP Admission Coordinator  [place]  Cell [phone]

## 2017-01-28 NOTE — Progress Notes (Signed)
Inpatient Rehabilitation  I have received insurance authorization, acute medical clearance, and have an IP Rehab bed available to offer patient today.  I have updated patient, family, nurse, and nurse case manager.  Will proceed with admission later today.  Please call with questions.   Carmelia Roller., CCC/SLP Admission Coordinator  [place]  Cell [phone]

## 2017-01-29 ENCOUNTER — Inpatient Hospital Stay (HOSPITAL_COMMUNITY): Payer: Medicare Other | Admitting: Physical Therapy

## 2017-01-29 ENCOUNTER — Inpatient Hospital Stay (HOSPITAL_COMMUNITY): Payer: Medicare Other | Admitting: Speech Pathology

## 2017-01-29 ENCOUNTER — Inpatient Hospital Stay (HOSPITAL_COMMUNITY): Payer: Medicare Other | Admitting: Occupational Therapy

## 2017-01-29 ENCOUNTER — Inpatient Hospital Stay (HOSPITAL_COMMUNITY): Payer: Medicare Other

## 2017-01-29 DIAGNOSIS — R269 Unspecified abnormalities of gait and mobility: Secondary | ICD-10-CM

## 2017-01-29 DIAGNOSIS — R414 Neurologic neglect syndrome: Secondary | ICD-10-CM

## 2017-01-29 DIAGNOSIS — I69398 Other sequelae of cerebral infarction: Secondary | ICD-10-CM

## 2017-01-29 DIAGNOSIS — I63511 Cerebral infarction due to unspecified occlusion or stenosis of right middle cerebral artery: Secondary | ICD-10-CM

## 2017-01-29 LAB — COMPREHENSIVE METABOLIC PANEL
ALT: 27 U/L (ref 17–63)
AST: 43 U/L — AB (ref 15–41)
Albumin: 3.5 g/dL (ref 3.5–5.0)
Alkaline Phosphatase: 95 U/L (ref 38–126)
Anion gap: 10 (ref 5–15)
BILIRUBIN TOTAL: 0.8 mg/dL (ref 0.3–1.2)
BUN: 13 mg/dL (ref 6–20)
CO2: 25 mmol/L (ref 22–32)
CREATININE: 1.12 mg/dL (ref 0.61–1.24)
Calcium: 9.3 mg/dL (ref 8.9–10.3)
Chloride: 103 mmol/L (ref 101–111)
Glucose, Bld: 92 mg/dL (ref 65–99)
Potassium: 3.8 mmol/L (ref 3.5–5.1)
Sodium: 138 mmol/L (ref 135–145)
TOTAL PROTEIN: 6.9 g/dL (ref 6.5–8.1)

## 2017-01-29 LAB — CBC WITH DIFFERENTIAL/PLATELET
BASOS ABS: 0 10*3/uL (ref 0.0–0.1)
Basophils Relative: 0 %
EOS PCT: 3 %
Eosinophils Absolute: 0.1 10*3/uL (ref 0.0–0.7)
HEMATOCRIT: 38.6 % — AB (ref 39.0–52.0)
Hemoglobin: 12.8 g/dL — ABNORMAL LOW (ref 13.0–17.0)
LYMPHS ABS: 1.4 10*3/uL (ref 0.7–4.0)
LYMPHS PCT: 35 %
MCH: 29 pg (ref 26.0–34.0)
MCHC: 33.2 g/dL (ref 30.0–36.0)
MCV: 87.3 fL (ref 78.0–100.0)
MONO ABS: 0.6 10*3/uL (ref 0.1–1.0)
Monocytes Relative: 14 %
NEUTROS ABS: 1.9 10*3/uL (ref 1.7–7.7)
Neutrophils Relative %: 48 %
PLATELETS: 176 10*3/uL (ref 150–400)
RBC: 4.42 MIL/uL (ref 4.22–5.81)
RDW: 14.9 % (ref 11.5–15.5)
WBC: 4 10*3/uL (ref 4.0–10.5)

## 2017-01-29 MED ORDER — HYDROCHLOROTHIAZIDE 25 MG PO TABS: 12.5000 mg | ORAL_TABLET | Freq: Every day | ORAL | Status: DC

## 2017-01-29 MED ORDER — HYDROCHLOROTHIAZIDE 25 MG PO TABS
12.5000 mg | ORAL_TABLET | Freq: Every day | ORAL | Status: DC
  Administered 2017-01-29 – 2017-02-03 (×6): 12.5 mg via ORAL
  Filled 2017-01-29 (×2): qty 1

## 2017-01-29 MED ORDER — INFLUENZA VAC SPLIT HIGH-DOSE 0.5 ML IM SUSY: 0.5000 mL | PREFILLED_SYRINGE | INTRAMUSCULAR | Status: DC | PRN

## 2017-01-29 MED ORDER — PNEUMOCOCCAL VAC POLYVALENT 25 MCG/0.5ML IJ INJ
0.5000 mL | INJECTION | INTRAMUSCULAR | Status: AC | PRN
Start: 2017-01-29 — End: 2017-02-02
  Administered 2017-02-02: 0.5 mL via INTRAMUSCULAR
  Filled 2017-01-29: qty 0.5

## 2017-01-29 NOTE — Progress Notes (Signed)
Patient information reviewed and entered into eRehab system by Marne Meline, RN, CRRN, PPS Coordinator.  Information including medical coding and functional independence measure will be reviewed and updated through discharge.     Per nursing patient was given "Data Collection Information Summary for Patients in Inpatient Rehabilitation Facilities with attached "Privacy Act Statement-Health Care Records" upon admission.  

## 2017-01-29 NOTE — Evaluation (Signed)
Occupational Therapy Assessment and Plan  Patient Details  Name: Ian Hill MRN: [id number] Date of Birth: 03/20/1947  OT Diagnosis: apraxia, ataxia, cognitive deficits, disturbance of vision, hemiplegia affecting non-dominant side and muscle weakness (generalized) Rehab Potential: Rehab Potential (ACUTE ONLY): Good ELOS: 5-7 days   Today's Date: 01/29/2017 OT Individual Time: 1100-1155 OT Individual Time Calculation (min): 55 min     Problem List:  Patient Active Problem List   Diagnosis Date Noted  . Right middle cerebral artery stroke ([place]) 01/28/2017  . Left hemiparesis ([place])   . PAF (paroxysmal atrial fibrillation) ([place])   . Benign prostatic hyperplasia   . Hyperlipidemia   . Thrombocytopenia ([place])   . Protein-calorie malnutrition, severe 01/24/2017  . Acute right arterial ischemic stroke, middle cerebral artery (MCA) ([place]) 01/23/2017  . Acute respiratory failure ([place]) 01/23/2017  . Essential hypertension 01/23/2017  . Acute metabolic encephalopathy 40/98/1191  . Endotracheal tube present   . Cerebrovascular accident (CVA) due to occlusion of right middle cerebral artery ([place]) - Right middle cerebral artery branch infarct do to right M1 occlusion treated with IV TPA and mechanical embolect   . Stage 3 chronic kidney disease   . Enlarged prostate with urinary retention 11/24/2016  . Lung nodule 09/16/2016  . Volume overload 09/10/2016  . Urinary retention 09/10/2016  . BPH (benign prostatic hyperplasia) 09/01/2016  . Periumbilical abdominal pain 09/01/2016  . Cerumen impaction 02/16/2016  . Decreased dorsalis pedis pulse 02/16/2016  . CKD (chronic kidney disease), stage II 02/20/2012  . Preventative health care 06/26/2010  . Other symptoms involving cardiovascular system 11/19/2009  . Hypertrophic obstructive cardiomyopathy ([place]) 10/10/2009  . Hyperlipidemia 04/30/2006  . TOBACCO ABUSE 04/30/2006  . Essential hypertension 04/30/2006  . ELEVATED PROSTATE  SPECIFIC ANTIGEN 04/30/2006  . DIVERTICULOSIS, COLON 12/18/2005    Past Medical History:  Past Medical History:  Diagnosis Date  . Abnormal ECG   . BPH (benign prostatic hyperplasia)   . CKD (chronic kidney disease), stage II   . Diverticulosis   . Dyslipidemia   . Elevated PSA   . Hypertension   . Hypertensive heart disease   . Hypertrophic obstructive cardiomyopathy with diastolic heart failure ([place])   . Mitral regurgitation   . Urine retention    Past Surgical History:  Past Surgical History:  Procedure Laterality Date  . 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 CARDIOVASCULAR STRESS TEST  11-19-2016  dr Stanford Breed   Low risk nuclear study/  normal resting and stress perfusion with no ischemia or infarction/  EF not done due to PVCIs , TID borderline, 1.2 significant LVH  . IR PERCUTANEOUS ART THROMBECTOMY/INFUSION INTRACRANIAL INC DIAG ANGIO  01/23/2017  . RADIOLOGY WITH ANESTHESIA N/A 01/23/2017   Procedure: RADIOLOGY WITH ANESTHESIA;  Surgeon: Luanne Bras, MD;  Location: [place];  Service: Radiology;  Laterality: N/A;  . TRANSTHORACIC ECHOCARDIOGRAM  11-20-2016   dr Stanford Breed   mild LVH,  apical hypertrophic cardiomyopathy, systolic function vigorous,  ef 65-70%,  grade 2 diastolic dysfunction/  mild AR, MR, and  PR/  moderate LAE and RAE/  mild to mod. TR/  moderate increase PASP 36mHg  . TRANSURETHRAL RESECTION OF PROSTATE N/A 11/24/2016   Procedure: TRANSURETHRAL RESECTION OF THE PROSTATE (TURP);  Surgeon: DFranchot Gallo MD;  [facility]  Service: Urology;  Laterality: N/A;    Assessment & Plan Clinical Impression: Ian Hill 66y.o.right handed malewith history of hypertension and mentally delayed. Patient lives with  sister. History taken from chart review. One level home. Patient performs his own ADLs but does not drive. He does use a cane for ambulation. Presented 01/23/2017 with left-sided weakness facial  droop and rightward eye deviation with left hemi-neglect. Cranial CT scan reviewed, no acute process. Per report, old right frontal lobe infarct. Patient did receive TPA. MRI brain showed cortical diffusion restriction throughout the posterior right MCA territory consistent with acute infarction. No hemorrhage or mass effect. CTA head and neck showed stenosis estimated 50% in both siphon regions. Cerebral angiogram occluded right MCA inferior and superior divisions. Underwent revascularization by interventional radiology. Echocardiogram with ejection fraction of 60%. Grade 2 Diastolic dysfunction.  Bouts of atrial fibrillation with rate control with mildly elevated troponin 0.19 and cardiology follow-up initial plan for TEE canceled.  Plan ongoing anticoagulation with Eliquis. Consider outpatient stress test.  Tolerating a regular consistency diet. Patient did have some initial agitation and restlessness and received Haldol. Blood sugars have all been acceptable. Hemoglobin A1c 5.7 and blood sugar checks discontinued. Physical and occupational therapy evaluation completed with recommendations of physical medicine rehabilitation consult. Patient was admitted for a comprehensive rehabilitation program  Patient transferred to CIR on 01/28/2017 .    Patient currently requires min with basic self-care skills secondary to muscle weakness, decreased cardiorespiratoy endurance, ataxia and decreased coordination, decreased attention, decreased problem solving and delayed processing and decreased standing balance, decreased postural control, hemiplegia and decreased balance strategies.  Prior to hospitalization, patient could complete ADLs with Supervision- mod I (according to pre-admission note. No family present on eval to provide accurate PLOF.  Patient will benefit from skilled intervention to decrease level of assist with basic self-care skills and increase independence with basic self-care skills prior to discharge  home with care partner.  Anticipate patient will require intermittent supervision and follow up home health.  OT - End of Session Activity Tolerance: Tolerates 10 - 20 min activity with multiple rests Endurance Deficit: Yes Endurance Deficit Description: Required rest breaks throughout bathing/dressing tasks OT Assessment Rehab Potential (ACUTE ONLY): Good OT Patient demonstrates impairments in the following area(s): Balance;Cognition;Endurance;Safety;Perception;Motor;Sensory;Vision OT Basic ADL's Functional Problem(s): Eating;Grooming;Bathing;Dressing;Toileting OT Transfers Functional Problem(s): Toilet;Tub/Shower OT Additional Impairment(s): Fuctional Use of Upper Extremity OT Plan OT Intensity: Minimum of 1-2 x/day, 45 to 90 minutes OT Frequency: 5 out of 7 days OT Duration/Estimated Length of Stay: 5-7 days OT Treatment/Interventions: Balance/vestibular training;Cognitive remediation/compensation;Discharge planning;Disease mangement/prevention;DME/adaptive equipment instruction;Functional mobility training;Functional electrical stimulation;Neuromuscular re-education;Pain management;Psychosocial support;Patient/family education;Self Care/advanced ADL retraining;Skin care/wound managment;Therapeutic Activities;Therapeutic Exercise;UE/LE Strength taining/ROM;UE/LE Coordination activities;Visual/perceptual remediation/compensation OT Self Feeding Anticipated Outcome(s): Mod I OT Basic Self-Care Anticipated Outcome(s): Supervision OT Toileting Anticipated Outcome(s): Mod I OT Bathroom Transfers Anticipated Outcome(s): Supervision OT Recommendation Patient destination: Home Follow Up Recommendations: Home health OT;Outpatient OT Equipment Recommended: To be determined   Skilled Therapeutic Intervention Pt seen for OT eval and ADL bathing/dressing session. Pt sitting up in w/c upon arrival, agreeable to tx session. He ambulated throughout room with CGA using SPC. He bathed sit <> Stand on  tub transfer bench requiring VCs for sequencing of basic ADLs and mod cuing for problem solving clothing orientation. Throughout functional tasks, pt would drop items in L UE without awareness, despite dis-coordination and decreased awareness, he was able to use L UE at non-dominant level. Pt completed grooming tasks standing at sink with CGA and VCs for sequencing. Pt left seated in recliner at end of session, QRB donned and all needs in reach.   OT Evaluation Precautions/Restrictions  Precautions Precautions: Fall  Precaution Comments: cognitive deficits at baseline Restrictions Weight Bearing Restrictions: No General Chart Reviewed: Yes Pain Pain Assessment Pain Assessment: 0-10 Pain Score: 0-No pain Pain Type: Acute pain Pain Location: Back Pain Orientation: Mid;Lower Pain Descriptors / Indicators: Aching Pain Frequency: Constant Pain Onset: On-going Pain Intervention(s): shower;Heat applied; repositioned Home Living/Prior Functioning Home Living Available Help at Discharge: Family, Available PRN/intermittently (Lives with sister) Type of Home: House Home Access: Stairs to enter Technical brewer of Steps: 3 Entrance Stairs-Rails: Right, Left Home Layout: One level Bathroom Shower/Tub: Tub/shower unit, Door Bathroom Accessibility: Yes Additional Comments: family not present, home accesibiity taken from acute notes  Lives With: Family IADL History Homemaking Responsibilities: No Current License: No Prior Function Level of Independence: Requires assistive device for independence  Able to Take Stairs?: Yes Vocation: Unemployed Comments: uses SPC for mobility in home and community Vision Vision Assessment?: No apparent visual deficits Additional Comments: Pt reports "eyes hurting" however unable to elaborate. Unable to formally assess due to cognitive deficits. Did not appear to impact function Perception  Perception: Impaired Body Scheme: Pt droppong items in L  hand without awareness throughout session Praxis Praxis: Impaired Cognition Overall Cognitive Status: History of cognitive impairments - at baseline Arousal/Alertness: Awake/alert Year: Other (Comment) (2012) Month: December Day of Week: Correct Memory: Appears intact Immediate Memory Recall: Sock;Blue;Bed Memory Recall: Sock;Blue;Bed Memory Recall Sock: Without Cue Memory Recall Blue: Without Cue Memory Recall Bed: Without Cue Attention: Sustained Focused Attention: Appears intact Sustained Attention: Impaired Sustained Attention Impairment: Functional basic;Verbal basic Awareness: Impaired Awareness Impairment: Emergent impairment Problem Solving: Impaired Problem Solving Impairment: Functional basic Behaviors: Impulsive Safety/Judgment: Impaired Comments: mild left inattention; decreased awareness of deficits Sensation Sensation Light Touch: Impaired Detail Light Touch Impaired Details: Impaired LUE Proprioception: Impaired Detail Proprioception Impaired Details: Impaired LUE Additional Comments: Unable to formally assess due to cognitive defcits, however, observed defciits during functional tasks.  Coordination Gross Motor Movements are Fluid and Coordinated: Yes Fine Motor Movements are Fluid and Coordinated: No Motor  Motor Motor: Hemiplegia Motor - Skilled Clinical Observations: generalized weakness; L UE>LE Trunk/Postural Assessment  Cervical Assessment Cervical Assessment: Exceptions to [facility] (Forward head) Thoracic Assessment Thoracic Assessment: Exceptions to [facility] (Rounded shoulders) Lumbar Assessment Lumbar Assessment: Exceptions to WFL (decreased lordosis) Postural Control Postural Control: Deficits on evaluation Righting Reactions: delayed  Balance Balance Balance Assessed: Yes Dynamic Sitting Balance Dynamic Sitting - Balance Support: During functional activity Dynamic Sitting - Level of Assistance: 4: Min assist Sitting balance - Comments: Durinng  seated dressing tasks Static Standing Balance Static Standing - Balance Support: During functional activity Static Standing - Level of Assistance: 4: Min assist Dynamic Standing Balance Dynamic Standing - Balance Support: During functional activity Dynamic Standing - Level of Assistance: 4: Min assist;3: Mod assist Dynamic Standing - Comments: During LB bathing/dressing and toileting tasks Extremity/Trunk Assessment RUE Assessment RUE Assessment: Within Functional Limits LUE Assessment LUE Assessment: Within Functional Limits (Unable to formally assess, however, strength at functional level during ADLs)   See Function Navigator for Current Functional Status.   Refer to Care Plan for Long Term Goals  Recommendations for other services: None    Discharge Criteria: Patient will be discharged from OT if patient refuses treatment 3 consecutive times without medical reason, if treatment goals not met, if there is a change in medical status, if patient makes no progress towards goals or if patient is discharged from hospital.  The above assessment, treatment plan, treatment alternatives and goals were discussed and mutually agreed upon: by patient  [name],  C 01/29/2017, 3:34 PM

## 2017-01-29 NOTE — Evaluation (Signed)
Speech Language Pathology Assessment and Plan  Patient Details  Name: Ian Hill MRN: [id number] Date of Birth: 03/20/1947  SLP Diagnosis: Cognitive Impairments  Rehab Potential: Good ELOS: 5 days     Today's Date: 01/29/2017 SLP Individual Time: 1305-1400 SLP Individual Time Calculation (min): 55 min   Problem List:  Patient Active Problem List   Diagnosis Date Noted  . Right middle cerebral artery stroke ([place]) 01/28/2017  . Left hemiparesis ([place])   . PAF (paroxysmal atrial fibrillation) ([place])   . Benign prostatic hyperplasia   . Hyperlipidemia   . Thrombocytopenia ([place])   . Protein-calorie malnutrition, severe 01/24/2017  . Acute right arterial ischemic stroke, middle cerebral artery (MCA) ([place]) 01/23/2017  . Acute respiratory failure ([place]) 01/23/2017  . Essential hypertension 01/23/2017  . Acute metabolic encephalopathy 02/77/4128  . Endotracheal tube present   . Cerebrovascular accident (CVA) due to occlusion of right middle cerebral artery ([place]) - Right middle cerebral artery branch infarct do to right M1 occlusion treated with IV TPA and mechanical embolect   . Stage 3 chronic kidney disease   . Enlarged prostate with urinary retention 11/24/2016  . Lung nodule 09/16/2016  . Volume overload 09/10/2016  . Urinary retention 09/10/2016  . BPH (benign prostatic hyperplasia) 09/01/2016  . Periumbilical abdominal pain 09/01/2016  . Cerumen impaction 02/16/2016  . Decreased dorsalis pedis pulse 02/16/2016  . CKD (chronic kidney disease), stage II 02/20/2012  . Preventative health care 06/26/2010  . Other symptoms involving cardiovascular system 11/19/2009  . Hypertrophic obstructive cardiomyopathy ([place]) 10/10/2009  . Hyperlipidemia 04/30/2006  . TOBACCO ABUSE 04/30/2006  . Essential hypertension 04/30/2006  . ELEVATED PROSTATE SPECIFIC ANTIGEN 04/30/2006  . DIVERTICULOSIS, COLON 12/18/2005   Past Medical History:  Past Medical History:  Diagnosis Date  .  Abnormal ECG   . BPH (benign prostatic hyperplasia)   . CKD (chronic kidney disease), stage II   . Diverticulosis   . Dyslipidemia   . Elevated PSA   . Hypertension   . Hypertensive heart disease   . Hypertrophic obstructive cardiomyopathy with diastolic heart failure ([place])   . Mitral regurgitation   . Urine retention    Past Surgical History:  Past Surgical History:  Procedure Laterality Date  . 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 CARDIOVASCULAR STRESS TEST  11-19-2016  dr Stanford Breed   Low risk nuclear study/  normal resting and stress perfusion with no ischemia or infarction/  EF not done due to PVCIs , TID borderline, 1.2 significant LVH  . IR PERCUTANEOUS ART THROMBECTOMY/INFUSION INTRACRANIAL INC DIAG ANGIO  01/23/2017  . RADIOLOGY WITH ANESTHESIA N/A 01/23/2017   Procedure: RADIOLOGY WITH ANESTHESIA;  Surgeon: Luanne Bras, MD;  Location: [place];  Service: Radiology;  Laterality: N/A;  . TRANSTHORACIC ECHOCARDIOGRAM  11-20-2016   dr Stanford Breed   mild LVH,  apical hypertrophic cardiomyopathy, systolic function vigorous,  ef 65-70%,  grade 2 diastolic dysfunction/  mild AR, MR, and  PR/  moderate LAE and RAE/  mild to mod. TR/  moderate increase PASP 58mHg  . TRANSURETHRAL RESECTION OF PROSTATE N/A 11/24/2016   Procedure: TRANSURETHRAL RESECTION OF THE PROSTATE (TURP);  Surgeon: DFranchot Gallo MD;  [facility]  Service: Urology;  Laterality: N/A;    Assessment / Plan / Recommendation Clinical Impression   Ian Andrewsis a 66y.o.right handed malewith history of hypertension and mentally delayed. Patient lives with sister. History taken from chart review. One level home. Patient performs his  own ADLs but does not drive. He does use a cane for ambulation. Presented 01/23/2017 with left-sided weakness facial droop and rightward eye deviation with left hemi-neglect. Cranial CT scan reviewed, no acute process. Per report, old right  frontal lobe infarct. Patient did receive TPA. MRI brain showed cortical diffusion restriction throughout the posterior right MCA territory consistent with acute infarction. No hemorrhage or mass effect.  Tolerating a regular consistency diet. Patient did have some initial agitation and restlessness and received Haldol.  Patient was admitted for a comprehensive rehabilitation program.  SLP evaluation was completed on 9/20 with the following results:  Pt presents with mild cognitive impairments s/p R CVA characterized by left inattention and decreased safety awareness with impulsivity during mobility.  As a result, pt would benefit from ST follow up while inpatient in order to maximize functional independence and reduce burden of care prior to discharge.  Would recommend that pt have supervision at discharge but do not anticipate ST needs at next level of care.    Skilled Therapeutic Interventions          Cognitive-linguistic evaluation completed with results and recommendations reviewed with patient.  Pt was 100% accurate with supervision cues for target identification during a basic symbol cancellation task; however, he demonstrated mild inattention to the left side of his body and the environment during ambulation.  Pt needed min cues for safety due to impulsivity while walking around the unit but was able to recall the route to his room with supervision question cues.  Pt was returned to bed and left with bed alarm set and call bell within reach.      SLP Assessment  Patient will need skilled [place] Pathology Services during CIR admission    Recommendations  Patient destination: Home Follow up Recommendations: None Equipment Recommended: None recommended by SLP    SLP Frequency 3 to 5 out of 7 days   SLP Duration  SLP Intensity  SLP Treatment/Interventions 5 days   Minumum of 1-2 x/day, 30 to 90 minutes  Cognitive remediation/compensation;Cueing hierarchy;Patient/family  education;Functional tasks;Environmental controls;Internal/external aids    Pain Pain Assessment Pain Assessment: No/denies pain  Prior Functioning Cognitive/Linguistic Baseline: Baseline deficits Baseline deficit details: per family intellectual disability since birth Type of Home: House  Lives With: Family Available Help at Discharge: Family;Available PRN/intermittently Vocation: Unemployed  Function:  Eating Eating   Modified Consistency Diet: No Eating Assist Level: Set up assist for   Eating Set Up Assist For: Opening containers;Cutting food       Cognition Comprehension Comprehension assist level: Follows basic conversation/direction with extra time/assistive device  Expression   Expression assist level: Expresses basic 90% of the time/requires cueing < 10% of the time.  Social Interaction Social Interaction assist level: Interacts appropriately 75 - 89% of the time - Needs redirection for appropriate language or to initiate interaction.  Problem Solving Problem solving assist level: Solves basic 75 - 89% of the time/requires cueing 10 - 24% of the time  Memory Memory assist level: Recognizes or recalls 75 - 89% of the time/requires cueing 10 - 24% of the time   Short Term Goals: Week 1: SLP Short Term Goal 1 (Week 1): STG = LTG due to ELOS  Refer to Care Plan for Long Term Goals  Recommendations for other services: None   Discharge Criteria: Patient will be discharged from SLP if patient refuses treatment 3 consecutive times without medical reason, if treatment goals not met, if there is a change in medical  status, if patient makes no progress towards goals or if patient is discharged from hospital.  The above assessment, treatment plan, treatment alternatives and goals were discussed and mutually agreed upon: by patient  , Selinda Orion 01/29/2017, 2:08 PM

## 2017-01-29 NOTE — Evaluation (Signed)
Physical Therapy Assessment and Plan  Patient Details  Name: Ian Hill MRN: [id number] Date of Birth: 03/20/1947  PT Diagnosis: Difficulty walking and Muscle weakness Rehab Potential: Good ELOS: 3-5 days   Today's Date: 01/29/2017 PT Individual Time: 4098-1191 PT Individual Time Calculation (min): 60 min    Problem List:  Patient Active Problem List   Diagnosis Date Noted  . Right middle cerebral artery stroke ([place]) 01/28/2017  . Left hemiparesis ([place])   . PAF (paroxysmal atrial fibrillation) ([place])   . Benign prostatic hyperplasia   . Hyperlipidemia   . Thrombocytopenia ([place])   . Protein-calorie malnutrition, severe 01/24/2017  . Acute right arterial ischemic stroke, middle cerebral artery (MCA) ([place]) 01/23/2017  . Acute respiratory failure ([place]) 01/23/2017  . Essential hypertension 01/23/2017  . Acute metabolic encephalopathy 47/82/9562  . Endotracheal tube present   . Cerebrovascular accident (CVA) due to occlusion of right middle cerebral artery ([place]) - Right middle cerebral artery branch infarct do to right M1 occlusion treated with IV TPA and mechanical embolect   . Stage 3 chronic kidney disease   . Enlarged prostate with urinary retention 11/24/2016  . Lung nodule 09/16/2016  . Volume overload 09/10/2016  . Urinary retention 09/10/2016  . BPH (benign prostatic hyperplasia) 09/01/2016  . Periumbilical abdominal pain 09/01/2016  . Cerumen impaction 02/16/2016  . Decreased dorsalis pedis pulse 02/16/2016  . CKD (chronic kidney disease), stage II 02/20/2012  . Preventative health care 06/26/2010  . Other symptoms involving cardiovascular system 11/19/2009  . Hypertrophic obstructive cardiomyopathy ([place]) 10/10/2009  . Hyperlipidemia 04/30/2006  . TOBACCO ABUSE 04/30/2006  . Essential hypertension 04/30/2006  . ELEVATED PROSTATE SPECIFIC ANTIGEN 04/30/2006  . DIVERTICULOSIS, COLON 12/18/2005    Past Medical History:  Past Medical History:  Diagnosis Date   . Abnormal ECG   . BPH (benign prostatic hyperplasia)   . CKD (chronic kidney disease), stage II   . Diverticulosis   . Dyslipidemia   . Elevated PSA   . Hypertension   . Hypertensive heart disease   . Hypertrophic obstructive cardiomyopathy with diastolic heart failure ([place])   . Mitral regurgitation   . Urine retention    Past Surgical History:  Past Surgical History:  Procedure Laterality Date  . 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 CARDIOVASCULAR STRESS TEST  11-19-2016  dr Stanford Breed   Low risk nuclear study/  normal resting and stress perfusion with no ischemia or infarction/  EF not done due to PVCIs , TID borderline, 1.2 significant LVH  . IR PERCUTANEOUS ART THROMBECTOMY/INFUSION INTRACRANIAL INC DIAG ANGIO  01/23/2017  . RADIOLOGY WITH ANESTHESIA N/A 01/23/2017   Procedure: RADIOLOGY WITH ANESTHESIA;  Surgeon: Luanne Bras, MD;  Location: [facility];  Service: Radiology;  Laterality: N/A;  . TRANSTHORACIC ECHOCARDIOGRAM  11-20-2016   dr Stanford Breed   mild LVH,  apical hypertrophic cardiomyopathy, systolic function vigorous,  ef 65-70%,  grade 2 diastolic dysfunction/  mild AR, MR, and  PR/  moderate LAE and RAE/  mild to mod. TR/  moderate increase PASP 70mHg  . TRANSURETHRAL RESECTION OF PROSTATE N/A 11/24/2016   Procedure: TRANSURETHRAL RESECTION OF THE PROSTATE (TURP);  Surgeon: DFranchot Gallo MD;  [facility]  Service: Urology;  Laterality: N/A;    Assessment & Plan Clinical Impression: Patient is a 66y.o. year old male with recent admission to the hospital on 01/23/2017 with left-sided weakness facial droop and rightward eye deviation with left hemi-neglect. Cranial CT scan  reviewed, no acute process. Per report, old right frontal lobe infarct. Patient did receive TPA. MRI brain showed cortical diffusion restriction throughout the posterior right MCA territory consistent with acute infarction. No hemorrhage or mass effect.  CTA head and neck showed stenosis estimated 50% in both siphon regions. Cerebral angiogram occluded right MCA inferior and superior divisions. Underwent revascularization by interventional radiology. Echocardiogram with ejection fraction of 60%. Grade 2 Diastolic dysfunction..  Patient transferred to CIR on 01/28/2017 .   Patient currently requires supervision with mobility secondary to muscle weakness and decreased standing balance and decreased balance strategies.  Prior to hospitalization, patient was modified independent  with mobility and lived with Family in a House home.  Home access is 3Stairs to enter.  Patient will benefit from skilled PT intervention to maximize safe functional mobility, minimize fall risk and decrease caregiver burden for planned discharge home with intermittent assist.  Anticipate patient will not need PT follow up at discharge.  PT - End of Session Activity Tolerance: Tolerates 30+ min activity with multiple rests Endurance Deficit: Yes PT Assessment Rehab Potential (ACUTE/IP ONLY): Good PT Barriers to Discharge: Decreased caregiver support PT Patient demonstrates impairments in the following area(s): Balance;Endurance;Motor;Safety PT Transfers Functional Problem(s): Bed Mobility;Bed to Chair;Car;Furniture;Floor PT Locomotion Functional Problem(s): Ambulation;Stairs PT Plan PT Intensity: Minimum of 1-2 x/day ,45 to 90 minutes PT Frequency: 5 out of 7 days PT Duration Estimated Length of Stay: 3-5 days PT Treatment/Interventions: Ambulation/gait training;Cognitive remediation/compensation;Discharge planning;DME/adaptive equipment instruction;Functional mobility training;Pain management;Splinting/orthotics;Therapeutic Activities;UE/LE Strength taining/ROM;Wheelchair propulsion/positioning;UE/LE Coordination activities;Therapeutic Exercise;Stair training;Patient/family education;Neuromuscular re-education;Functional electrical stimulation;Community  reintegration;Balance/vestibular training PT Transfers Anticipated Outcome(s): mod I PT Locomotion Anticipated Outcome(s): mod I PT Recommendation Follow Up Recommendations: None Patient destination: Home Equipment Recommended: To be determined  Skilled Therapeutic Intervention Pt participated in skilled PT eval and was educated on PT POC and goals. Pt performed gait without AD 15' x 2 with min A.  Gait with SPC 150' x 3 throughout session with supervision in home and controlled environments. Gait on uneven surfaces with SPC with close supervision. Simulated car transfer (pt states his brother drives a car) with supervision.  Sit to stands and standing balance for dressing without AD with min A.  Stair negotiation with 2 handrails with supervision x 8 steps.  Pt demos decreased awareness, no family present to give baseline level of mobility and cognition.  PT Evaluation Precautions/Restrictions Precautions Precautions: Fall Precaution Comments: cognitive deficits at baseline Restrictions Weight Bearing Restrictions: No  Pain Pain Assessment Pain Assessment: No/denies pain Home Living/Prior Functioning Home Living Available Help at Discharge: Family;Available PRN/intermittently Type of Home: House Home Access: Stairs to enter [employer] of Steps: 3 Home Layout: One level Additional Comments: family not present, pt not a reliable historian  Lives With: Family Prior Function Level of Independence: Requires assistive device for independence  Able to Take Stairs?: Yes Vocation: Unemployed Comments: uses SPC for mobility in home and community  Cognition Overall Cognitive Status: No family/caregiver present to determine baseline cognitive functioning Safety/Judgment: Impaired Sensation Sensation Light Touch: Appears Intact Proprioception: Appears Intact Coordination Gross Motor Movements are Fluid and Coordinated: Yes Motor  Motor Motor - Skilled Clinical  Observations: generalized weakness   Trunk/Postural Assessment  Cervical Assessment Cervical Assessment:  (forward head) Thoracic Assessment Thoracic Assessment:  (rounded shoulders) Lumbar Assessment Lumbar Assessment:  (decreased lordosis) Postural Control Postural Control: Deficits on evaluation Righting Reactions: delayed  Extremity Assessment      RLE Assessment RLE Assessment:  (grossly 4-/5) LLE Assessment LLE Assessment:  (  grossly 4-/5)   See Function Navigator for Current Functional Status.   Refer to Care Plan for Long Term Goals  Recommendations for other services: None   Discharge Criteria: Patient will be discharged from PT if patient refuses treatment 3 consecutive times without medical reason, if treatment goals not met, if there is a change in medical status, if patient makes no progress towards goals or if patient is discharged from hospital.  The above assessment, treatment plan, treatment alternatives and goals were discussed and mutually agreed upon: by patient  [facility] 01/29/2017, 9:42 AM

## 2017-01-29 NOTE — Progress Notes (Signed)
Initial Nutrition Assessment  DOCUMENTATION CODES:   Underweight  INTERVENTION:   - Magic Cup TID provides 290 kcal and 9 grams of protein per serving  NUTRITION DIAGNOSIS:   Increased nutrient needs related to acute illness (CVA) as evidenced by estimated needs  GOAL:   Patient will meet greater than or equal to 90% of their needs  MONITOR:   PO intake, Weight trends, Labs  REASON FOR ASSESSMENT:   Malnutrition Screening Tool Assessment of nutrition requirement/status  ASSESSMENT:   66 year old male presented with a CVA on 9/14. PMH of HTN, Diverticulosis, CKD Stage II, Dyslipidemia, and mental delay. On regular diet.  Pt reports a good appetite currently and PTA. Pt typically eats twice a day, foods such as pork chops, fried chicken, collard greens, and pinto beans. Pt eats snacks such as ice cream or cookies between meals. Does not take nutritional supplements at home. Pt states his usual weight is 150 lbs and that he's not sure why he's losing weight as he is eating normally. Pt is on a regular diet.  Labs: Reviewed  Medications: Zocor  Nutrition Focused Physical Exam Findings: Unable to assess, pt had to leave for x-rays. Pt appeared frail from observation.    Diet Order:  Diet regular Room service appropriate? Yes; Fluid consistency: Thin  Skin:  Reviewed, no issues  Last BM:  01/28/17  Height:   Ht Readings from Last 1 Encounters:  01/24/17 5\' 10"  (1.778 m)    Weight:   Wt Readings from Last 1 Encounters:  01/25/17 123 lb 7.3 oz (56 kg)    Ideal Body Weight:  75 kg  BMI:  17.3  Estimated Nutritional Needs:   Kcal:  1600-1800 kcals  Protein:  85-95 grams of protein  Fluid:  1.6-1.8 L  EDUCATION NEEDS:   No education needs identified at this time  [place] Intern Pager: [phone] 01/29/2017 4:22 PM

## 2017-01-29 NOTE — Progress Notes (Signed)
Subjective/Complaints: Pt without c/os Slept ok and had breakfast  Has Left side low back pain, reports a fall at onset of CVA, asking for pain meds  PMH: cognitive limitations  Objective: Vital Signs: Blood pressure 100/72, pulse (!) 103, temperature 98.2 F (36.8 C), temperature source Oral, resp. rate 16, SpO2 100 %. No results found. Results for orders placed or performed during the hospital encounter of 01/28/17 (from the past 72 hour(s))  CBC WITH DIFFERENTIAL     Status: Abnormal   Collection Time: 01/29/17  6:44 AM  Result Value Ref Range   WBC 4.0 4.0 - 10.5 K/uL   RBC 4.42 4.22 - 5.81 MIL/uL   Hemoglobin 12.8 (L) 13.0 - 17.0 g/dL   HCT 38.6 (L) 39.0 - 52.0 %   MCV 87.3 78.0 - 100.0 fL   MCH 29.0 26.0 - 34.0 pg   MCHC 33.2 30.0 - 36.0 g/dL   RDW 14.9 11.5 - 15.5 %   Platelets 176 150 - 400 K/uL   Neutrophils Relative % 48 %   Neutro Abs 1.9 1.7 - 7.7 K/uL   Lymphocytes Relative 35 %   Lymphs Abs 1.4 0.7 - 4.0 K/uL   Monocytes Relative 14 %   Monocytes Absolute 0.6 0.1 - 1.0 K/uL   Eosinophils Relative 3 %   Eosinophils Absolute 0.1 0.0 - 0.7 K/uL   Basophils Relative 0 %   Basophils Absolute 0.0 0.0 - 0.1 K/uL  Comprehensive metabolic panel     Status: Abnormal   Collection Time: 01/29/17  6:44 AM  Result Value Ref Range   Sodium 138 135 - 145 mmol/L   Potassium 3.8 3.5 - 5.1 mmol/L   Chloride 103 101 - 111 mmol/L   CO2 25 22 - 32 mmol/L   Glucose, Bld 92 65 - 99 mg/dL   BUN 13 6 - 20 mg/dL   Creatinine, Ser 1.12 0.61 - 1.24 mg/dL   Calcium 9.3 8.9 - 10.3 mg/dL   Total Protein 6.9 6.5 - 8.1 g/dL   Albumin 3.5 3.5 - 5.0 g/dL   AST 43 (H) 15 - 41 U/L   ALT 27 17 - 63 U/L   Alkaline Phosphatase 95 38 - 126 U/L   Total Bilirubin 0.8 0.3 - 1.2 mg/dL   GFR calc non Af Amer >60 >60 mL/min   GFR calc Af Amer >60 >60 mL/min    Comment: (NOTE) The eGFR has been calculated using the CKD EPI equation. This calculation has not been validated in all clinical  situations. eGFR's persistently <60 mL/min signify possible Chronic Kidney Disease.    Anion gap 10 5 - 15     HEENT: edentulous Cardio: RRR and no murmur Resp: CTA B/L and unlabored GI: BS positive and NT, ND Extremity:  Pulses positive and No Edema Skin:   Intact Neuro: Anxious, Normal Sensory, Normal Motor and Inattention Musc/Skel:  Other tenderness aL side above iliac crest Gen NAD   Assessment/Plan: 1. Functional deficits secondary to Right MCA infarct s/p revascularization which require 3+ hours per day of interdisciplinary therapy in a comprehensive inpatient rehab setting. Physiatrist is providing close team supervision and 24 hour management of active medical problems listed below. Physiatrist and rehab team continue to assess barriers to discharge/monitor patient progress toward functional and medical goals. FIM:                   Function - Comprehension Comprehension: Auditory Comprehension assist level: Understands basic 75 - 89% of the time/  requires cueing 10 - 24% of the time  Function - Expression Expression: Verbal Expression assist level: Expresses basic 50 - 74% of the time/requires cueing 25 - 49% of the time. Needs to repeat parts of sentences.  Function - Social Interaction Social Interaction assist level: Interacts appropriately 90% of the time - Needs monitoring or encouragement for participation or interaction.  Function - Problem Solving Problem solving assist level: Solves basic 75 - 89% of the time/requires cueing 10 - 24% of the time  Function - Memory Memory assist level: Recognizes or recalls 75 - 89% of the time/requires cueing 10 - 24% of the time Patient normally able to recall (first 3 days only): Staff names and faces, That he or she is in a hospital  Medical Problem List and Plan: 1.  Left hemisensory deficits/left hemiparesis secondary to right MCA infarction/right M1 occlusion treated with IV TPA and mechanical embolectomy  with revascularization CIR PT, OT, SLP 2.  DVT Prophylaxis/Anticoagulation: Eliquis- monitor for signs of bleeding 3. Pain Management: Tylenol as needed, low back pain , fall check xray lumbar spine 4. Mood: Provide emotional support 5. Neuropsych: This patient is capable of making decisions on his own behalf. 6. Skin/Wound Care: Routine skin checks 7. Fluids/Electrolytes/Nutrition: Routine I&O with follow-up chemistries 8. PAF. Cardiac rate control. Follow-up cardiology services. 9. Hypertension. HCTZ 25 mg daily, Tenormin 50 mg daily. Monitor with increased mobility, running low will reduce HCTZ Vitals:   01/28/17 1952 01/29/17 0456  BP: 110/70 100/72  Pulse: 80 (!) 103  Resp: 18 16  Temp: 98.5 F (36.9 C) 98.2 F (36.8 C)  SpO2: 98% 100%   10. BPH. Pro scar 5 mg daily, Flomax 0.4 mg daily. Check PVR 3 11. Hyperlipidemia. Zocor 12. Thrombocytopenia: Follow CBC  LOS (Days) 1 A FACE TO FACE EVALUATION WAS PERFORMED  Latysha Thackston E 01/29/2017, 8:45 AM

## 2017-01-29 NOTE — Plan of Care (Signed)
Problem: RH PAIN MANAGEMENT Goal: RH STG PAIN MANAGED AT OR BELOW PT'S PAIN GOAL Pain less than or equal to 2.   

## 2017-01-30 ENCOUNTER — Inpatient Hospital Stay (HOSPITAL_COMMUNITY): Payer: Self-pay | Admitting: Speech Pathology

## 2017-01-30 ENCOUNTER — Inpatient Hospital Stay (HOSPITAL_COMMUNITY): Payer: Self-pay | Admitting: Occupational Therapy

## 2017-01-30 ENCOUNTER — Encounter (HOSPITAL_COMMUNITY): Payer: Self-pay | Admitting: *Deleted

## 2017-01-30 ENCOUNTER — Inpatient Hospital Stay (HOSPITAL_COMMUNITY): Payer: Self-pay | Admitting: Physical Therapy

## 2017-01-30 DIAGNOSIS — R269 Unspecified abnormalities of gait and mobility: Secondary | ICD-10-CM

## 2017-01-30 DIAGNOSIS — R414 Neurologic neglect syndrome: Secondary | ICD-10-CM | POA: Diagnosis present

## 2017-01-30 DIAGNOSIS — I69398 Other sequelae of cerebral infarction: Secondary | ICD-10-CM

## 2017-01-30 DIAGNOSIS — I10 Essential (primary) hypertension: Secondary | ICD-10-CM

## 2017-01-30 SURGERY — ECHOCARDIOGRAM, TRANSESOPHAGEAL
Anesthesia: Monitor Anesthesia Care

## 2017-01-30 NOTE — Progress Notes (Signed)
Speech Language Pathology Daily Session Note  Patient Details  Name: Ian Hill MRN: [id number] Date of Birth: May 25, 1950  Today's Date: 01/30/2017 SLP Individual Time: 1300-1330 SLP Individual Time Calculation (min): 30 min  Short Term Goals: Week 1: SLP Short Term Goal 1 (Week 1): STG = LTG due to ELOS  Skilled Therapeutic Interventions: Skilled treatment session focused on cognition goals. SLP facilitated session by providing Min A cues to set-up lunch tray and to manage items on his tray. Pt with impulsivity (baseline) and proceeded to wipe up spilled items with blanket before he was able to think through task. Pt left upright in bed, bed alarm on and all needs within reach. Continue per current plan of care.      Function:  Eating Eating   Modified Consistency Diet: No Eating Assist Level: Set up assist for   Eating Set Up Assist For: Opening containers;Cutting food       Cognition Comprehension Comprehension assist level: Follows basic conversation/direction with no assist;Follows basic conversation/direction with extra time/assistive device  Expression   Expression assist level: Expresses basic 90% of the time/requires cueing < 10% of the time.  Social Interaction Social Interaction assist level: Interacts appropriately 75 - 89% of the time - Needs redirection for appropriate language or to initiate interaction.  Problem Solving Problem solving assist level: Solves basic 75 - 89% of the time/requires cueing 10 - 24% of the time;Solves basic 50 - 74% of the time/requires cueing 25 - 49% of the time  Memory Memory assist level: Recognizes or recalls 25 - 49% of the time/requires cueing 50 - 75% of the time    Pain    Therapy/Group: Individual Therapy  Sarann Tregre 01/30/2017, 1:44 PM

## 2017-01-30 NOTE — Progress Notes (Signed)
Occupational Therapy Session Note  Patient Details  Name: Ian Hill MRN: [id number] Date of Birth: 03/20/1947  Today's Date: 01/30/2017 OT Individual Time: 1856-3149 OT Individual Time Calculation (min): 60 min    Short Term Goals: Week 1:  OT Short Term Goal 1 (Week 1): STG=LTG due to LOS  Skilled Therapeutic Interventions/Progress Updates:    Pt seen for OT ADL bathing/dressing session and session focusing on L neuro re-ed. Pt in supine upon arrival, agreeable to tx session. Pt's bed and brief saturated with urine and with encouragement, pt agreeable to showering task. He ambulated throughout session with CGA using SPC intermittently, using it correctly or just swinging it by his side, requiring VCs for proper use of cane.  He bathed seated on tub transfer bench with distant supervision, following VCs to being task.  He dressed seated on toilet requiring assist for clothing orientation and to problem solve mistake when attempting to place B LEs into same leg hole. Grooming tasks completed standing at sink, using L LE at non-dominant level. He ambulated throughout unit to therapy gym requiring intermittent steadying assist as pt with LOB episodes and scissoring when distracted during ambulation. Seated on EOM, completed peg board activity. Constraint induced therapy used for forced use of L UE to complete task. He required max cuing for replicating simple alternating color pattern. He ambulated back to room at end of session, left in supine with all needs in reach and bed alarm on.   Therapy Documentation Precautions:  Precautions Precautions: Fall Precaution Comments: cognitive deficits at baseline Restrictions Weight Bearing Restrictions: No Pain:   No/ denies pain  See Function Navigator for Current Functional Status.   Therapy/Group: Individual Therapy  Lewis, Shain Pau[place]els C 01/30/2017, 7:02 AM

## 2017-01-30 NOTE — Progress Notes (Signed)
Subjective/Complaints: Pt without c/os  Has Left side low back pain, improved with Kpad, Xray results neg, reviewed with pt  PMH: cognitive limitations, but denies sleep problems, bowel or bladder issues, pt with cognitive limitations  Objective: Vital Signs: Blood pressure 109/67, pulse (!) 101, temperature 98 F (36.7 C), temperature source Oral, resp. rate 18, SpO2 100 %. Dg Lumbar Spine 2-3 Views  Result Date: 01/29/2017 CLINICAL DATA:  Fall 3 days ago. Left-sided low back pain. Initial encounter. EXAM: LUMBAR SPINE - 2-3 VIEW COMPARISON:  None. FINDINGS: There is no evidence of lumbar spine fracture. Alignment is normal. Intervertebral disc spaces are maintained. No focal bone lesions identified. Aortic atherosclerosis. IMPRESSION: No radiographic abnormality of lumbar spine. Electronically Signed   By: Earle Gell M.D.   On: 01/29/2017 18:00   Results for orders placed or performed during the hospital encounter of 01/28/17 (from the past 72 hour(s))  CBC WITH DIFFERENTIAL     Status: Abnormal   Collection Time: 01/29/17  6:44 AM  Result Value Ref Range   WBC 4.0 4.0 - 10.5 K/uL   RBC 4.42 4.22 - 5.81 MIL/uL   Hemoglobin 12.8 (L) 13.0 - 17.0 g/dL   HCT 38.6 (L) 39.0 - 52.0 %   MCV 87.3 78.0 - 100.0 fL   MCH 29.0 26.0 - 34.0 pg   MCHC 33.2 30.0 - 36.0 g/dL   RDW 14.9 11.5 - 15.5 %   Platelets 176 150 - 400 K/uL   Neutrophils Relative % 48 %   Neutro Abs 1.9 1.7 - 7.7 K/uL   Lymphocytes Relative 35 %   Lymphs Abs 1.4 0.7 - 4.0 K/uL   Monocytes Relative 14 %   Monocytes Absolute 0.6 0.1 - 1.0 K/uL   Eosinophils Relative 3 %   Eosinophils Absolute 0.1 0.0 - 0.7 K/uL   Basophils Relative 0 %   Basophils Absolute 0.0 0.0 - 0.1 K/uL  Comprehensive metabolic panel     Status: Abnormal   Collection Time: 01/29/17  6:44 AM  Result Value Ref Range   Sodium 138 135 - 145 mmol/L   Potassium 3.8 3.5 - 5.1 mmol/L   Chloride 103 101 - 111 mmol/L   CO2 25 22 - 32 mmol/L   Glucose,  Bld 92 65 - 99 mg/dL   BUN 13 6 - 20 mg/dL   Creatinine, Ser 1.12 0.61 - 1.24 mg/dL   Calcium 9.3 8.9 - 10.3 mg/dL   Total Protein 6.9 6.5 - 8.1 g/dL   Albumin 3.5 3.5 - 5.0 g/dL   AST 43 (H) 15 - 41 U/L   ALT 27 17 - 63 U/L   Alkaline Phosphatase 95 38 - 126 U/L   Total Bilirubin 0.8 0.3 - 1.2 mg/dL   GFR calc non Af Amer >60 >60 mL/min   GFR calc Af Amer >60 >60 mL/min    Comment: (NOTE) The eGFR has been calculated using the CKD EPI equation. This calculation has not been validated in all clinical situations. eGFR's persistently <60 mL/min signify possible Chronic Kidney Disease.    Anion gap 10 5 - 15     HEENT: edentulous Cardio: RRR and no murmur Resp: CTA B/L and unlabored GI: BS positive and NT, ND Extremity:  Pulses positive and No Edema Skin:   Intact Neuro: Anxious, Normal Sensory, Normal Motor and Inattention Musc/Skel:  Other tenderness aL side above iliac crest Gen NAD   Assessment/Plan: 1. Functional deficits secondary to Right MCA infarct s/p revascularization which require 3+  hours per day of interdisciplinary therapy in a comprehensive inpatient rehab setting. Physiatrist is providing close team supervision and 24 hour management of active medical problems listed below. Physiatrist and rehab team continue to assess barriers to discharge/monitor patient progress toward functional and medical goals. FIM: Function - Bathing Position: Shower Body parts bathed by patient: Right arm, Right upper leg, Left arm, Left upper leg, Chest, Abdomen, Right lower leg, Front perineal area, Left lower leg, Buttocks Body parts bathed by helper: Back Assist Level: Touching or steadying assistance(Pt > 75%)  Function- Upper Body Dressing/Undressing What is the patient wearing?: Button up shirt Button up shirt - Perfomed by patient: Thread/unthread left sleeve Button up shirt - Perfomed by helper: Thread/unthread right sleeve, Pull shirt around back, Button/unbutton  shirt Function - Lower Body Dressing/Undressing What is the patient wearing?: Pants, Non-skid slipper socks Position: Other (comment) (sitting on toilet) Pants- Performed by patient: Thread/unthread right pants leg, Pull pants up/down Pants- Performed by helper: Thread/unthread left pants leg Non-skid slipper socks- Performed by patient: Don/doff right sock, Don/doff left sock Assist for footwear: Supervision/touching assist Assist for lower body dressing: Touching or steadying assistance (Pt > 75%)  Function - Toileting Toileting steps completed by patient: Adjust clothing prior to toileting, Performs perineal hygiene, Adjust clothing after toileting Assist level: Touching or steadying assistance (Pt.75%)  Function - Air cabin crew transfer assistive device: Grab bar, Cane Assist level to toilet: Touching or steadying assistance (Pt > 75%) Assist level from toilet: Touching or steadying assistance (Pt > 75%)  Function - Chair/bed transfer Chair/bed transfer assist level: Touching or steadying assistance (Pt > 75%)  Function - Locomotion: Wheelchair Will patient use wheelchair at discharge?: No Function - Locomotion: Ambulation Assistive device: No device Max distance: 15 Assist level: Touching or steadying assistance (Pt > 75%) Assist level: Touching or steadying assistance (Pt > 75%) Assist level: Supervision or verbal cues  Function - Comprehension Comprehension: Auditory Comprehension assist level: Follows complex conversation/direction with extra time/assistive device  Function - Expression Expression: Verbal Expression assist level: Expresses basic 90% of the time/requires cueing < 10% of the time.  Function - Social Interaction Social Interaction assist level: Interacts appropriately 75 - 89% of the time - Needs redirection for appropriate language or to initiate interaction.  Function - Problem Solving Problem solving assist level: Solves basic 75 - 89% of  the time/requires cueing 10 - 24% of the time  Function - Memory Memory assist level: Recognizes or recalls 75 - 89% of the time/requires cueing 10 - 24% of the time Patient normally able to recall (first 3 days only): That he or she is in a hospital  Medical Problem List and Plan: 1.  Left hemisensory deficits/left hemiparesis secondary to right MCA infarction/right M1 occlusion treated with IV TPA and mechanical embolectomy with revascularization CIR PT, OT, SLP 2.  DVT Prophylaxis/Anticoagulation: Eliquis- no signs of bleeding 3. Pain Management: Tylenol as needed, low back pain , normal xray lumbar spine 4. Mood: Provide emotional support 5. Neuropsych: This patient is capable of making decisions on his own behalf. 6. Skin/Wound Care: Routine skin checks 7. Fluids/Electrolytes/Nutrition: Routine I&O with follow-up chemistries 8. PAF. Cardiac rate control. Follow-up cardiology services. 9. Hypertension. HCTZ 25 mg daily, Tenormin 50 mg daily. Monitor with increased mobility, reduced HCTZ to 12.5 on 9/20 Vitals:   01/30/17 0548 01/30/17 0840  BP: 95/64 109/67  Pulse: (!) 104 (!) 101  Resp: 18   Temp: 98 F (36.7 C)   SpO2: 100%  10. BPH. Pro scar 5 mg daily, Flomax 0.4 mg daily. Check PVR 3 11. Hyperlipidemia. Zocor 12. Thrombocytopenia: Follow CBC  LOS (Days) 2 A FACE TO FACE EVALUATION WAS PERFORMED  Renn Dirocco E 01/30/2017, 8:53 AM

## 2017-01-30 NOTE — Progress Notes (Signed)
Speech Language Pathology Daily Session Note  Patient Details  Name: Ian Hill MRN: [id number] Date of Birth: 08-Jul-1950  Today's Date: 01/30/2017 SLP Individual Time: 1345-1443 SLP Individual Time Calculation (min): 58 min  Short Term Goals: Week 1: SLP Short Term Goal 1 (Week 1): STG = LTG due to ELOS  Skilled Therapeutic Interventions:  Pt was seen for skilled ST targeting cognitive goals.  Pt demonstrated improved safety awareness during ambulation in comparison to yesterday's therapy session.  He had significantly fewer instances of bumping into therapist on the left side and could recall routes to and from treatment room and therapy gym with supervision question cues.  Therapist facilitated the session with a novel board game to address left attention and functional problem solving.  Pt was 100% accurate for placing and locating targets on the left side but needed max assist for problem solving, which SLP suspects to be pt's baseline.  Pt was left in wheelchair with call bell within reach and quick release belt donned for safety.  Continue per current plan of care.       Function:  Eating Eating              Cognition Comprehension Comprehension assist level: Follows basic conversation/direction with extra time/assistive device  Expression   Expression assist level: Expresses basic 90% of the time/requires cueing < 10% of the time.  Social Interaction Social Interaction assist level: Interacts appropriately 90% of the time - Needs monitoring or encouragement for participation or interaction.  Problem Solving Problem solving assist level: Solves basic 75 - 89% of the time/requires cueing 10 - 24% of the time  Memory Memory assist level: Recognizes or recalls 50 - 74% of the time/requires cueing 25 - 49% of the time    Pain Pain Assessment Pain Assessment: No/denies pain  Therapy/Group: Individual Therapy  Jenina Moening, Selinda Orion 01/30/2017, 2:43 PM

## 2017-01-30 NOTE — IPOC Note (Signed)
Overall Plan of Care [facility]) Patient Details Name: Helaman Mecca MRN: [id number] DOB: 1950-12-13  Admitting Diagnosis: Gait disturbance, post-stroke  Hospital Problems: Principal Problem:   Gait disturbance, post-stroke Active Problems:   Acute right arterial ischemic stroke, middle cerebral artery (MCA) (HCC)   Left-sided neglect     Functional Problem List: Nursing Bladder, Bowel, Medication Management, Nutrition, Pain, Safety  PT Balance, Endurance, Motor, Safety  OT Balance, Cognition, Endurance, Safety, Perception, Motor, Sensory, Vision  SLP Safety  TR         Basic ADL's: OT Eating, Grooming, Bathing, Dressing, Toileting     Advanced  ADL's: OT       Transfers: PT Bed Mobility, Bed to Chair, Car, [employer], Floor  OT Toilet, Tub/Shower     Locomotion: PT Ambulation, Stairs     Additional Impairments: OT Fuctional Use of Upper Extremity  SLP Social Cognition   Problem Solving, Attention, Awareness  TR      Anticipated Outcomes Item Anticipated Outcome  Self Feeding Mod I  Swallowing      Basic self-care  Supervision  Toileting  Mod I   Bathroom Transfers Supervision  Bowel/Bladder  pt to remain continent x 2 with min assistnance  Transfers  mod I  Locomotion  mod I  Communication     Cognition  supervision   Pain  less than 2 out of 10  Safety/Judgment  pt to remian free from falls with min assistance   Therapy Plan: PT Intensity: Minimum of 1-2 x/day ,45 to 90 minutes PT Frequency: 5 out of 7 days PT Duration Estimated Length of Stay: 3-5 days OT Intensity: Minimum of 1-2 x/day, 45 to 90 minutes OT Frequency: 5 out of 7 days OT Duration/Estimated Length of Stay: 5-7 days SLP Intensity: Minumum of 1-2 x/day, 30 to 90 minutes SLP Frequency: 3 to 5 out of 7 days SLP Duration/Estimated Length of Stay: 5 days     Team Interventions: Nursing Interventions Patient/Family Education, Bladder Management, Bowel Management, Pain  Management, Medication Management, Disease Management/Prevention, Discharge Planning  PT interventions Ambulation/gait training, Cognitive remediation/compensation, Discharge planning, DME/adaptive equipment instruction, Functional mobility training, Pain management, Splinting/orthotics, Therapeutic Activities, UE/LE Strength taining/ROM, Wheelchair propulsion/positioning, UE/LE Coordination activities, Therapeutic Exercise, Stair training, Patient/family education, Neuromuscular re-education, Functional electrical stimulation, Community reintegration, Training and development officer  OT Interventions Training and development officer, Cognitive remediation/compensation, Discharge planning, Disease mangement/prevention, DME/adaptive equipment instruction, Functional mobility training, Functional electrical stimulation, Neuromuscular re-education, Pain management, Psychosocial support, Patient/family education, Self Care/advanced ADL retraining, Skin care/wound managment, Therapeutic Activities, Therapeutic Exercise, UE/LE Strength taining/ROM, UE/LE Coordination activities, Visual/perceptual remediation/compensation  SLP Interventions Cognitive remediation/compensation, English as a second language teacher, Patient/family education, Functional tasks, Environmental controls, Internal/external aids  TR Interventions    SW/CM Interventions Discharge Planning, Psychosocial Support, Patient/Family Education   Barriers to Discharge MD  Medical stability  Nursing Incontinence caregiver support  PT Decreased caregiver support    OT      SLP      SW       Team Discharge Planning: Destination: PT-Home ,OT- Home , SLP-Home Projected Follow-up: PT-None, OT-  Home health OT, Outpatient OT, SLP-None Projected Equipment Needs: PT-To be determined, OT- To be determined, SLP-None recommended by SLP Equipment Details: PT- , OT-  Patient/family involved in discharge planning: PT- Patient,  OT-Patient, SLP-Patient  MD ELOS: 5-7  days Medical Rehab Prognosis:  Excellent Assessment: The patient has been admitted for CIR therapies with the diagnosis of right mca infarct. The team will be addressing functional mobility, strength, stamina, balance, safety, adaptive  techniques and equipment, self-care, bowel and bladder mgt, patient and caregiver education, NMR, cognitive perceptual rx, ego support, community reintegration. Goals have been set at mod I for basic mobility, supervision to mod I with self-care and ADL's and supervision with cognition.    Meredith Staggers, MD, FAAPMR      See Team Conference Notes for weekly updates to the plan of care

## 2017-01-30 NOTE — Progress Notes (Signed)
Occupational Therapy Session Note  Patient Details  Name: Ian Hill MRN: [id number] Date of Birth: 04/09/1951  Today's Date: 01/30/2017 OT Individual Time: 1447-1530 OT Individual Time Calculation (min): 43 min    Short Term Goals: Week 1:  OT Short Term Goal 1 (Week 1): STG=LTG due to LOS  Skilled Therapeutic Interventions/Progress Updates:    Pt completed functional mobility with his SPC to the day room with min guard assist.  He needed min instructional cueing to use the cane correctly as he would at times pick it up and walk without it touching the ground.  Had pt complete 2 intervals with use of the UE ergonometer.  The first interval lasted for 4 mins with pt using BUEs.  Mod demonstrational cueing needed for him to sustain attention to the left hand and maintain grip on the handle.  Frequently he would let go of it without any awareness, secondary to left inattention.  Second interval was with isolated use of the LUE.  He was only able to maintain for 1 minute secondary to being internally distracted by his back pain.  Gave him mod re-direction to maintain sustained attention to the left hand again as well.  He was able to complete 1 more minute before task was terminated secondary to decreased attention level.  Next had pt ambulate to the therapy gym where he sat on the therapy mat and worked on ball toss and [place].  He was able to catch and toss medium sized ball with 95% accuracy.  Also had pt complete 9 hole peg test.  He was able to place and remove pegs with the right hand in 32 seconds, but needed multiple cues to integrate trying with the left and then needed over 2 mins to complete, finishing task with the right again, even though he was cued multiple times not to use it.  Finished session with ambulation back to the room with min guard assist.  Pt left in wheelchair at end of session with safety belt in place and chair alarm.  K pad positioned to lower back.     Therapy Documentation Precautions:  Precautions Precautions: Fall Precaution Comments: cognitive deficits at baseline Restrictions Weight Bearing Restrictions: No  Pain: Pain Assessment Pain Assessment: Faces Faces Pain Scale: Hurts even more Pain Type: Acute pain Pain Location: Back Pain Orientation: Lower Pain Descriptors / Indicators: Discomfort Pain Onset: With Activity Pain Intervention(s): Repositioned;Heat applied;Emotional support ADL: See Function Navigator for Current Functional Status.   Therapy/Group: Individual Therapy  Tunya Held OTR/L 01/30/2017, 4:02 PM

## 2017-01-31 ENCOUNTER — Inpatient Hospital Stay (HOSPITAL_COMMUNITY): Payer: Medicare Other | Admitting: *Deleted

## 2017-01-31 ENCOUNTER — Inpatient Hospital Stay (HOSPITAL_COMMUNITY): Payer: Medicaid Other

## 2017-01-31 ENCOUNTER — Inpatient Hospital Stay (HOSPITAL_COMMUNITY): Payer: Medicaid Other | Admitting: Speech Pathology

## 2017-01-31 ENCOUNTER — Inpatient Hospital Stay (HOSPITAL_COMMUNITY): Payer: Medicare Other | Admitting: Occupational Therapy

## 2017-01-31 NOTE — Progress Notes (Signed)
Social Work Assessment and Plan  Patient Details  Name: Ian Hill MRN: [id number] Date of Birth: Jan 29, 1951  Today's Date: 01/30/2017  Problem List:  Patient Active Problem List   Diagnosis Date Noted  . Left-sided neglect 01/30/2017  . Gait disturbance, post-stroke 01/30/2017  . Left hemiparesis ([place])   . PAF (paroxysmal atrial fibrillation) ([place])   . Benign prostatic hyperplasia   . Hyperlipidemia   . Thrombocytopenia ([place])   . Protein-calorie malnutrition, severe 01/24/2017  . Acute right arterial ischemic stroke, middle cerebral artery (MCA) ([place]) 01/23/2017  . Acute respiratory failure ([place]) 01/23/2017  . Essential hypertension 01/23/2017  . Acute metabolic encephalopathy 74/04/8785  . Endotracheal tube present   . Cerebrovascular accident (CVA) due to occlusion of right middle cerebral artery ([place]) - Right middle cerebral artery branch infarct do to right M1 occlusion treated with IV TPA and mechanical embolect   . Stage 3 chronic kidney disease    Past Medical History:  Past Medical History:  Diagnosis Date  . Abnormal ECG   . Hypertensive heart disease    Past Surgical History:  Past Surgical History:  Procedure Laterality Date  . IR PERCUTANEOUS ART THROMBECTOMY/INFUSION INTRACRANIAL INC DIAG ANGIO  01/23/2017  . RADIOLOGY WITH ANESTHESIA N/A 01/23/2017   Procedure: RADIOLOGY WITH ANESTHESIA;  Surgeon: Luanne Bras, MD;  Location: [facility];  Service: Radiology;  Laterality: N/A;   Social History:  has no tobacco, alcohol, and drug history on file.  Family / Support Systems Marital Status: Single Patient Roles: Other (Comment) (sibling; cousin) Other Supports: [name] - sister - [phone] (h); [phone] (m) Anticipated Caregiver: Family  Ability/Limitations of Caregiver: available PRN Caregiver Availability: 24/7 Family Dynamics: Sister will be with pt and if she needs to go out, she will have another family member to be with pt. Close  family.  Social History Preferred language: English Religion: christian Education: Pt with cognitive limitations at baseline due to mental delays. Write: No Employment Status: Disabled Public relations account executive Issues: none reported Guardian/Conservator: N/A - Per MD, pt is capable of making his own decisions.     Abuse/Neglect Physical Abuse: Denies Verbal Abuse: Denies Sexual Abuse: Denies Exploitation of patient/patient's resources: Denies Self-Neglect: Denies  Emotional Status Pt's affect, behavior and adjustment status: Pt was pleasant and positive with CSW.  He and his sister are faithful people and their fatih in God is important to them.  Pt is enjoying the therapy, the gym, and the therapists. Recent Psychosocial Issues: None reported Psychiatric History: None reported Substance Abuse History: None reported  Patient / Family Perceptions, Expectations & Goals Pt/Family understanding of illness & functional limitations: Pt's sister expressed a good understanding of pt's condition and limitations and pt demonstates a basic understanding, as well. Premorbid pt/family roles/activities: Pt enjoys walking to the store and watching TV. Anticipated changes in roles/activities/participation: Pt would like to resume these activities as he is able.  Sister knows he needs to be supervised now for these and other activities. Pt/family expectations/goals: Pt wants to get home to his sister, but is enjoying the rehab program.  [place]: None Premorbid Home Care/DME Agencies: Other (Comment) (pt has a cane) Transportation available at discharge: family - other siblings, as Ms. Owens Shark does not drive.  They will also help with MD appts, as pt and sister usually take the bus. Resource referrals recommended: Support group (specify)  Discharge Planning Living Arrangements: Other relatives Support Systems: Other relatives, Friends/neighbors Type of Residence:  Private residence (with sister) Insurance underwriter  Resources: Medicare, Medicaid (specify county) ([place]) Financial Resources: SSD, Family Support Financial Screen Referred: No Living Expenses: Lives with family Money Management: Family Does the patient have any problems obtaining your medications?: No Home Management: Pt's sister assists with this. Patient/Family Preliminary Plans: Pt to return to the home pt shares with his sister. Social Work Anticipated Follow Up Needs: HH/OP, Support Group Expected length of stay: 5 to 7 days  Clinical Impression CSW met with pt and spoke with pt's sister via telephone to introduce self and role of CSW, as well as to complete assessment.  Sister is so appreciative of pt being on CIR and she is grateful that his stroke wasn't worse.  Pt will have supervision from his sister and when she needs to be away, another family member will help her with pt.  Pt is motivated and enjoying rehab program.  Sister is supportive of more therapy after d/c and open to any DME pt may need at home.  CSW will assist with all of this in preparation of pt's d/c on 02-03-17.  CSW will continue to follow.  ,  Capps 01/31/2017, 2:04 AM   

## 2017-01-31 NOTE — Progress Notes (Signed)
Inpatient [facility] Individual Statement of Services  Patient Name:  Ian Hill  Date:  01/31/2017  Welcome to the [facility].  Our goal is to provide you with an individualized program based on your diagnosis and situation, designed to meet your specific needs.  With this comprehensive rehabilitation program, you will be expected to participate in at least 3 hours of rehabilitation therapies Monday-Friday, with modified therapy programming on the weekends.  Your rehabilitation program will include the following services:  Physical Therapy (PT), Occupational Therapy (OT), Speech Therapy (ST), 24 hour per day rehabilitation nursing, Case Management (Social Worker), Rehabilitation Medicine, Nutrition Services and Pharmacy Services  Weekly team conferences will be held on Wednesdays to discuss your progress.  Your Social Worker will talk with you frequently to get your input and to update you on team discussions.  Team conferences with you and your family in attendance may also be held.  Expected length of stay: 5 to 7 days  Overall anticipated outcome:  Supervision  Depending on your progress and recovery, your program may change. Your Social Worker will coordinate services and will keep you informed of any changes. Your Social Worker's name and contact numbers are listed  below.  The following services may also be recommended but are not provided by the [facility] will be made to provide these services after discharge if needed.  Arrangements include referral to agencies that provide these services.  Your insurance has been verified to be:  [employer] and Medicaid Your primary doctor is:  Dr. [name]  Pertinent information will be shared with your doctor and your  insurance company.  Social Worker:  Alfonse Alpers, LCSW  [phone] or ([phone]  Information discussed with and copy given to patient by: Trey Sailors, 01/31/2017, 12:27 AM

## 2017-01-31 NOTE — Progress Notes (Signed)
Occupational Therapy Session Note  Patient Details  Name: Ian Hill MRN: [id number] Date of Birth: 05-03-1951  Today's Date: 01/31/2017 OT Individual Time: 1105-1200 OT Individual Time Calculation (min): 55 min    Short Term Goals: Week 1:  OT Short Term Goal 1 (Week 1): STG=LTG due to LOS  Skilled Therapeutic Interventions/Progress Updates:    Pt seen this am for ADL training with a focus on standing balance. Pt received in recliner and was agreeable to a shower.  He requested to practice ambulating to bathroom without his cane.  Pt ambulated to bathroom with close S to steadying A. He stood to [employer] over feet with steadying A and then was able to step into shower, stand in shower and bathe self with S.  He stated his sister often helps him wash his back at home.  He washed himself with some mild perseveration on washing his feet. Ambulated out to room to don scrub clothing and his boxer briefs with close S.  Pt talked at great length about his family and how much every one helps him.  Pt very appreciative of A this am. Pt resting in recliner with quick release belt on and call light in reach.      Therapy Documentation Precautions:  Precautions Precautions: Fall Precaution Comments: cognitive deficits at baseline Restrictions Weight Bearing Restrictions: No   Pain: no c/o pain   ADL:    See Function Navigator for Current Functional Status.   Therapy/Group: Individual Therapy  Toluwanimi Radebaugh 01/31/2017, 1:03 PM

## 2017-01-31 NOTE — Progress Notes (Signed)
Physical Therapy Session Note  Patient Details  Name: Ian Hill MRN: [id number] Date of Birth: Dec 20, 1950  Today's Date: 01/31/2017 PT Individual Time: 0900-0945 PT Individual Time Calculation (min): 45 min   Short Term Goals: Week 1:  PT Short Term Goal 1 (Week 1): STG=LTG  Skilled Therapeutic Interventions/Progress Updates:  Tx focused on functional mobility training, BERG and DGI assessment, and NMR via functional balance training and challenge, and multi-modal cues. Pt up in bed, agreeable to PT. No pain this morning.  See function tab for mobility details, overall S/Min A for multi-directional instability during balance challenges and distracting environments.  Sit<>stand with S, bed<>chair with CGA.  Gait in controlled setting 3x150' with SPC and up to Min A in busy and congested environments.  Pt instructed in Berg and DGI. See results below for fall risk assessment. Pt educated on functional and safety implications. Pt had most difficulty with situations requiring narrow BOS and eyes closed. Righting reactions are delayed. Pt will benefit from ongoing perturbations and gait challenges.  Stair training x12 with S and bil rails.  Pt left up in recliner with all needs in reach and quick-release belt.       Therapy Documentation Precautions:  Precautions Precautions: Fall Precaution Comments: cognitive deficits at baseline Restrictions Weight Bearing Restrictions: No General:   Vital Signs: Therapy Vitals Pulse Rate: (!) 102 BP: 110/70 Pain: none    Balance: Standardized Balance Assessment Standardized Balance Assessment: Dynamic Gait Index Berg Balance Test Sit to Stand: Able to stand  independently using hands Standing Unsupported: Able to stand 2 minutes with supervision Sitting with Back Unsupported but Feet Supported on Floor or Stool: Able to sit safely and securely 2 minutes Stand to Sit: Sits safely with minimal use of hands Transfers: Able to transfer  safely, definite need of hands Standing Unsupported with Eyes Closed: Able to stand 3 seconds Standing Ubsupported with Feet Together: Able to place feet together independently but unable to hold for 30 seconds From Standing, Reach Forward with Outstretched Arm: Can reach confidently >25 cm (10") From Standing Position, Pick up Object from Floor: Able to pick up shoe, needs supervision From Standing Position, Turn to Look Behind Over each Shoulder: Looks behind one side only/other side shows less weight shift Turn 360 Degrees: Able to turn 360 degrees safely one side only in 4 seconds or less Standing Unsupported, Alternately Place Feet on Step/Stool: Able to stand independently and safely and complete 8 steps in 20 seconds Standing Unsupported, One Foot in Front: Able to take small step independently and hold 30 seconds Standing on One Leg: Able to lift leg independently and hold 5-10 seconds Total Score: 43   Dynamic Gait Index Level Surface: Mild Impairment Change in Gait Speed: Mild Impairment Gait with Horizontal Head Turns: Mild Impairment Gait with Vertical Head Turns: Mild Impairment Gait and Pivot Turn: Mild Impairment Step Over Obstacle: Mild Impairment Step Around Obstacles: Mild Impairment Steps: Mild Impairment Total Score: 16 Exercises:   Other Treatments:     See Function Navigator for Current Functional Status.   Therapy/Group: Individual Therapy  Ian Hill 01/31/2017, 9:41 AM

## 2017-01-31 NOTE — Progress Notes (Signed)
Subjective/Complaints: Complained of headache last night and again this morning. Tylenol seemed to help  ROS: Limited due to cognitive/behavioral   Objective: Vital Signs: Blood pressure 110/70, pulse (!) 102, temperature 98.4 F (36.9 C), temperature source Oral, resp. rate 16, SpO2 100 %. Dg Lumbar Spine 2-3 Views  Result Date: 01/29/2017 CLINICAL DATA:  Fall 3 days ago. Left-sided low back pain. Initial encounter. EXAM: LUMBAR SPINE - 2-3 VIEW COMPARISON:  None. FINDINGS: There is no evidence of lumbar spine fracture. Alignment is normal. Intervertebral disc spaces are maintained. No focal bone lesions identified. Aortic atherosclerosis. IMPRESSION: No radiographic abnormality of lumbar spine. Electronically Signed   By: Earle Gell M.D.   On: 01/29/2017 18:00   Results for orders placed or performed during the hospital encounter of 01/28/17 (from the past 72 hour(s))  CBC WITH DIFFERENTIAL     Status: Abnormal   Collection Time: 01/29/17  6:44 AM  Result Value Ref Range   WBC 4.0 4.0 - 10.5 K/uL   RBC 4.42 4.22 - 5.81 MIL/uL   Hemoglobin 12.8 (L) 13.0 - 17.0 g/dL   HCT 38.6 (L) 39.0 - 52.0 %   MCV 87.3 78.0 - 100.0 fL   MCH 29.0 26.0 - 34.0 pg   MCHC 33.2 30.0 - 36.0 g/dL   RDW 14.9 11.5 - 15.5 %   Platelets 176 150 - 400 K/uL   Neutrophils Relative % 48 %   Neutro Abs 1.9 1.7 - 7.7 K/uL   Lymphocytes Relative 35 %   Lymphs Abs 1.4 0.7 - 4.0 K/uL   Monocytes Relative 14 %   Monocytes Absolute 0.6 0.1 - 1.0 K/uL   Eosinophils Relative 3 %   Eosinophils Absolute 0.1 0.0 - 0.7 K/uL   Basophils Relative 0 %   Basophils Absolute 0.0 0.0 - 0.1 K/uL  Comprehensive metabolic panel     Status: Abnormal   Collection Time: 01/29/17  6:44 AM  Result Value Ref Range   Sodium 138 135 - 145 mmol/L   Potassium 3.8 3.5 - 5.1 mmol/L   Chloride 103 101 - 111 mmol/L   CO2 25 22 - 32 mmol/L   Glucose, Bld 92 65 - 99 mg/dL   BUN 13 6 - 20 mg/dL   Creatinine, Ser 1.12 0.61 - 1.24 mg/dL    Calcium 9.3 8.9 - 10.3 mg/dL   Total Protein 6.9 6.5 - 8.1 g/dL   Albumin 3.5 3.5 - 5.0 g/dL   AST 43 (H) 15 - 41 U/L   ALT 27 17 - 63 U/L   Alkaline Phosphatase 95 38 - 126 U/L   Total Bilirubin 0.8 0.3 - 1.2 mg/dL   GFR calc non Af Amer >60 >60 mL/min   GFR calc Af Amer >60 >60 mL/min    Comment: (NOTE) The eGFR has been calculated using the CKD EPI equation. This calculation has not been validated in all clinical situations. eGFR's persistently <60 mL/min signify possible Chronic Kidney Disease.    Anion gap 10 5 - 15     HEENT: edentulous Cardio: RRR without murmur. No JVD  Resp: CTA Bilaterally without wheezes or rales. Normal effort  GI: BS positive and NT, ND Extremity:  Pulses positive and No Edema Skin:   Intact Neuro: Anxious, Normal Sensory, Normal Motor and Inattention Musc/Skel:  Other tenderness aL side above iliac crest Gen NAD   Assessment/Plan: 1. Functional deficits secondary to Right MCA infarct s/p revascularization which require 3+ hours per day of interdisciplinary therapy in a  comprehensive inpatient rehab setting. Physiatrist is providing close team supervision and 24 hour management of active medical problems listed below. Physiatrist and rehab team continue to assess barriers to discharge/monitor patient progress toward functional and medical goals. FIM: Function - Bathing Position: Shower Body parts bathed by patient: Right arm, Right upper leg, Left arm, Left upper leg, Chest, Abdomen, Right lower leg, Front perineal area, Left lower leg, Buttocks Body parts bathed by helper: Back Assist Level: Touching or steadying assistance(Pt > 75%)  Function- Upper Body Dressing/Undressing What is the patient wearing?: Pull over shirt/dress Pull over shirt/dress - Perfomed by patient: Thread/unthread right sleeve, Thread/unthread left sleeve, Put head through opening, Pull shirt over trunk Button up shirt - Perfomed by patient: Thread/unthread left  sleeve Button up shirt - Perfomed by helper: Thread/unthread right sleeve, Pull shirt around back, Button/unbutton shirt Assist Level: Supervision or verbal cues Function - Lower Body Dressing/Undressing What is the patient wearing?: Pants, Non-skid slipper socks Position:  (Sitting on toilet) Pants- Performed by patient: Thread/unthread right pants leg, Pull pants up/down Pants- Performed by helper: Thread/unthread left pants leg Non-skid slipper socks- Performed by patient: Don/doff right sock, Don/doff left sock Assist for footwear: Supervision/touching assist Assist for lower body dressing: Touching or steadying assistance (Pt > 75%)  Function - Toileting Toileting activity did not occur: No continent bowel/bladder event Toileting steps completed by patient: Adjust clothing prior to toileting, Performs perineal hygiene, Adjust clothing after toileting Toileting Assistive Devices: Grab bar or rail Assist level: Touching or steadying assistance (Pt.75%)  Function - Air cabin crew transfer assistive device: Grab bar, Cane Assist level to toilet: Touching or steadying assistance (Pt > 75%) Assist level from toilet: Touching or steadying assistance (Pt > 75%)  Function - Chair/bed transfer Chair/bed transfer assist level: Touching or steadying assistance (Pt > 75%)  Function - Locomotion: Wheelchair Will patient use wheelchair at discharge?: No Function - Locomotion: Ambulation Assistive device: Cane-straight Max distance: 150 Assist level: Touching or steadying assistance (Pt > 75%) Assist level: Touching or steadying assistance (Pt > 75%) Assist level: Touching or steadying assistance (Pt > 75%) Assist level: Touching or steadying assistance (Pt > 75%) Assist level: Supervision or verbal cues  Function - Comprehension Comprehension: Auditory Comprehension assist level: Follows basic conversation/direction with extra time/assistive device  Function -  Expression Expression: Verbal Expression assist level: Expresses basic 90% of the time/requires cueing < 10% of the time.  Function - Social Interaction Social Interaction assist level: Interacts appropriately 90% of the time - Needs monitoring or encouragement for participation or interaction.  Function - Problem Solving Problem solving assist level: Solves basic 75 - 89% of the time/requires cueing 10 - 24% of the time  Function - Memory Memory assist level: Recognizes or recalls 50 - 74% of the time/requires cueing 25 - 49% of the time Patient normally able to recall (first 3 days only): That he or she is in a hospital  Medical Problem List and Plan: 1.  Left hemisensory deficits/left hemiparesis secondary to right MCA infarction/right M1 occlusion treated with IV TPA and mechanical embolectomy with revascularization CIR PT, OT, SLP 2.  DVT Prophylaxis/Anticoagulation: Eliquis- no bleeding sequelae 3. Pain Management: Tylenol as needed, low back pain , normal xray lumbar spine  -follow for more regular headache symptoms---saw pt later in the morning and was not complaining of headaches then 4. Mood: Provide emotional support 5. Neuropsych: This patient is capable of making decisions on his own behalf. 6. Skin/Wound Care: Routine skin checks 7.  Fluids/Electrolytes/Nutrition: Routine I&O with follow-up chemistries examined 8. PAF. Cardiac rate control. Follow-up cardiology services. 9. Hypertension. HCTZ 25 mg daily, Tenormin 50 mg daily. Monitor with increased mobility, reduced HCTZ to 12.5 on 9/20 Vitals:   01/31/17 0448 01/31/17 0833  BP: 103/72 110/70  Pulse: 100 (!) 102  Resp: 16   Temp: 98.4 F (36.9 C)   SpO2: 100%    10. BPH. Pro scar 5 mg daily, Flomax 0.4 mg daily. Check PVR 3 11. Hyperlipidemia. Zocor 12. Thrombocytopenia: Follow CBC  LOS (Days) 3 A FACE TO FACE EVALUATION WAS PERFORMED  Jaylie Neaves T 01/31/2017, 10:11 AM

## 2017-01-31 NOTE — Progress Notes (Signed)
Occupational Therapy Session Note  Patient Details  Name: Ian Hill MRN: [id number] Date of Birth: 06/03/1950  Today's Date: 01/31/2017 OT Individual Time: 1500-1557 OT Individual Time Calculation (min): 57 min    Short Term Goals: Week 1:  OT Short Term Goal 1 (Week 1): STG=LTG due to LOS  Skilled Therapeutic Interventions/Progress Updates:    1:1. No c/o pain. Pt dons shoes with increased time and question cueing for pt to identify donned B shoes on incorrect feet. Pt doff shoes and assumes seated figure 4 to attempt to fasten shoes, however unable to tie shoes 2/2 decreased FMC, sensation and apraxia in LUE. OT fastens B shoes. Pt ambulates throughout session using SPC with VC for technique as pt often drags it or carries in in B hands. Pt stands to assemble jenga pieces to improve manipulation skills grasping/rotating block with LUE only and modified constraint induced therapy. Pt ambulates outside with MAX cues for pathfinding and ambulates over uneven surfaces and down stairs. Pt able to cross street and look to check for cars in B directions. Pt often talking and cued to attend to L side as pt almost steps off curb/run into wall on L. Exited session with pt seated in bed, bed exit alarm on, with call light in reach and all needs met.   Therapy Documentation Precautions:  Precautions Precautions: Fall Precaution Comments: cognitive deficits at baseline Restrictions Weight Bearing Restrictions: No General:    See Function Navigator for Current Functional Status.   Therapy/Group: Individual Therapy   M  01/31/2017, 4:59 PM 

## 2017-01-31 NOTE — Progress Notes (Signed)
Speech Language Pathology Daily Session Note  Patient Details  Name: Mika Anastasi MRN: [id number] Date of Birth: Jun 03, 1950  Today's Date: 01/31/2017 SLP Individual Time: 1419-1445 SLP Individual Time Calculation (min): 26 min  Short Term Goals: Week 1: SLP Short Term Goal 1 (Week 1): STG = LTG due to ELOS  Skilled Therapeutic Interventions:  Pt was seen for skilled ST targeting cognitive goals.  SLP facilitated the session with a pipe tree task to address left attention and problem solving.  Pt needed mod-max assist verbal and visual cues to replicate figures from images out of PVC pipe.  Pt was noted to drop objects from his left hand and needed mod assist verbal cues to complete the left sides of figures.  Pt was left in bed with bed alarm set and call bell within reach.  Continue per current plan of care.       Function:  Eating Eating                 Cognition Comprehension Comprehension assist level: Follows basic conversation/direction with extra time/assistive device  Expression   Expression assist level: Expresses basic 90% of the time/requires cueing < 10% of the time.  Social Interaction Social Interaction assist level: Interacts appropriately 90% of the time - Needs monitoring or encouragement for participation or interaction.  Problem Solving Problem solving assist level: Solves basic 50 - 74% of the time/requires cueing 25 - 49% of the time  Memory Memory assist level: Recognizes or recalls 75 - 89% of the time/requires cueing 10 - 24% of the time    Pain Pain Assessment Pain Assessment: No/denies pain  Therapy/Group: Individual Therapy  Ilean Spradlin, Selinda Orion 01/31/2017, 2:49 PM

## 2017-02-01 ENCOUNTER — Inpatient Hospital Stay (HOSPITAL_COMMUNITY): Payer: Medicare Other

## 2017-02-01 NOTE — Progress Notes (Signed)
Subjective/Complaints: Slept well last night. Headaches better,. A little chilly this morning  ROS: pt denies nausea, vomiting, diarrhea, cough, shortness of breath or chest pain    Objective: Vital Signs: Blood pressure 119/80, pulse 81, temperature 97.7 F (36.5 C), temperature source Oral, resp. rate 18, SpO2 100 %. No results found. No results found for this or any previous visit (from the past 72 hour(s)).   HEENT: edentulous Cardio: RRR without murmur. No JVD  Resp: CTA Bilaterally without wheezes or rales. Normal effort  GI: BS positive and NT, ND Extremity:  Pulses positive and No Edema Skin:   Intact. Normal warmth Neuro: Anxious, Normal Sensory, Normal Motor and Inattention Musc/Skel:  Other tenderness aL side above iliac crest Gen NAD   Assessment/Plan: 1. Functional deficits secondary to Right MCA infarct s/p revascularization which require 3+ hours per day of interdisciplinary therapy in a comprehensive inpatient rehab setting. Physiatrist is providing close team supervision and 24 hour management of active medical problems listed below. Physiatrist and rehab team continue to assess barriers to discharge/monitor patient progress toward functional and medical goals. FIM: Function - Bathing Position: Shower Body parts bathed by patient: Right arm, Right upper leg, Left arm, Left upper leg, Chest, Abdomen, Right lower leg, Front perineal area, Left lower leg, Buttocks Body parts bathed by helper: Back Assist Level: Supervision or verbal cues  Function- Upper Body Dressing/Undressing What is the patient wearing?: Pull over shirt/dress Pull over shirt/dress - Perfomed by patient: Thread/unthread right sleeve, Thread/unthread left sleeve, Put head through opening, Pull shirt over trunk Button up shirt - Perfomed by patient: Thread/unthread left sleeve Button up shirt - Perfomed by helper: Thread/unthread right sleeve, Pull shirt around back, Button/unbutton  shirt Assist Level: Supervision or verbal cues Function - Lower Body Dressing/Undressing What is the patient wearing?: Underwear, Pants, Non-skid slipper socks Position: Wheelchair/chair at sink Underwear - Performed by patient: Thread/unthread right underwear leg, Thread/unthread left underwear leg, Pull underwear up/down Pants- Performed by patient: Thread/unthread right pants leg, Thread/unthread left pants leg, Pull pants up/down Pants- Performed by helper: Thread/unthread left pants leg Non-skid slipper socks- Performed by patient: Don/doff right sock, Don/doff left sock Assist for footwear: Supervision/touching assist Assist for lower body dressing: Touching or steadying assistance (Pt > 75%)  Function - Toileting Toileting activity did not occur: No continent bowel/bladder event Toileting steps completed by patient: Adjust clothing prior to toileting, Performs perineal hygiene, Adjust clothing after toileting Toileting Assistive Devices: Grab bar or rail Assist level: Touching or steadying assistance (Pt.75%)  Function - Air cabin crew transfer assistive device: Grab bar, Cane Assist level to toilet: Touching or steadying assistance (Pt > 75%) Assist level from toilet: Touching or steadying assistance (Pt > 75%)  Function - Chair/bed transfer Chair/bed transfer assist level: Touching or steadying assistance (Pt > 75%)  Function - Locomotion: Wheelchair Will patient use wheelchair at discharge?: No Function - Locomotion: Ambulation Assistive device: Cane-straight Max distance: 150 Assist level: Touching or steadying assistance (Pt > 75%) Assist level: Touching or steadying assistance (Pt > 75%) Assist level: Touching or steadying assistance (Pt > 75%) Assist level: Touching or steadying assistance (Pt > 75%) Assist level: Supervision or verbal cues  Function - Comprehension Comprehension: Auditory Comprehension assist level: Follows basic conversation/direction  with extra time/assistive device  Function - Expression Expression: Verbal Expression assist level: Expresses basic 90% of the time/requires cueing < 10% of the time.  Function - Social Interaction Social Interaction assist level: Interacts appropriately 90% of the time - Needs  monitoring or encouragement for participation or interaction.  Function - Problem Solving Problem solving assist level: Solves basic 50 - 74% of the time/requires cueing 25 - 49% of the time  Function - Memory Memory assist level: Recognizes or recalls 75 - 89% of the time/requires cueing 10 - 24% of the time Patient normally able to recall (first 3 days only): That he or she is in a hospital  Medical Problem List and Plan: 1.  Left hemisensory deficits/left hemiparesis secondary to right MCA infarction/right M1 occlusion treated with IV TPA and mechanical embolectomy with revascularization CIR PT, OT, SLP 2.  DVT Prophylaxis/Anticoagulation: Eliquis- no bleeding sequelae 3. Pain Management: Tylenol as needed, low back pain , normal xray lumbar spine  -headaches better---observe 4. Mood: Provide emotional support 5. Neuropsych: This patient is capable of making decisions on his own behalf. 6. Skin/Wound Care: Routine skin checks 7. Fluids/Electrolytes/Nutrition: Routine I&O with follow-up chemistries examined 8. PAF. Cardiac rate control. Follow-up cardiology services. 9. Hypertension. HCTZ 25 mg daily, Tenormin 50 mg daily. Monitor with increased mobility, reduced HCTZ to 12.5 on 9/20 Vitals:   02/01/17 0409 02/01/17 0831  BP: 116/77 119/80  Pulse: 77 81  Resp: 18   Temp: 97.7 F (36.5 C)   SpO2: 100%    10. BPH. Pro scar 5 mg daily, Flomax 0.4 mg daily. Check PVR 3 11. Hyperlipidemia. Zocor 12. Thrombocytopenia: Follow CBC  LOS (Days) 4 A FACE TO FACE EVALUATION WAS PERFORMED  Ian Hill T 02/01/2017, 9:47 AM

## 2017-02-01 NOTE — Progress Notes (Addendum)
Physical Therapy Note  Patient Details  Name: Leavy Heatherly MRN: [id number] Date of Birth: 1951-01-05 Today's Date: 02/01/2017  1445-1600, 75 min individual tx Pain: , L back pain when in bed; intermittent; Ed, RN notified; K pad repositioned at end of session  Pt declined leaving room to go to therapy room.  Neuro re-ed in supine: bil bridging, bil lower trunk rotation( smal excursion), straight leg raises, resisted ankle PF during anke pumpms.  Standing wth bil UE support [place] A exs: mini squats, R/L hamstring curls, heell/toe raises, R/L hip abduction; seated R/L long arc quad knee ext.  Pt c/o back pain and requested Tylenol. Gait on level tile and carpet with SPC x 250' with close supervision except for mod assist needed for a turn to L when pt was distracted by greeting 2 women in the hall.  R/L SLS with bil UE support on raised bed rail, with ability to lift one hand up for a few seconds. Attempted to teach pt standing runner's stretch for heel cords, but pt unable to reproduce with demo or multimodal cues or hand over hand assist. On bed, pt able to get into quadruped> 3 point with LLE raised, x 4, briefly.  Pt left resting in bed, alarm set and all needs within reach.  Pt's hamstrings are very tight, affecting his high level balance.  See function navigator for current status. Angelica Wix 02/01/2017, 12:59 PM

## 2017-02-02 ENCOUNTER — Inpatient Hospital Stay (HOSPITAL_COMMUNITY): Payer: Medicare Other | Admitting: Physical Therapy

## 2017-02-02 ENCOUNTER — Inpatient Hospital Stay (HOSPITAL_COMMUNITY): Payer: Self-pay | Admitting: Occupational Therapy

## 2017-02-02 ENCOUNTER — Inpatient Hospital Stay (HOSPITAL_COMMUNITY): Payer: Medicaid Other

## 2017-02-02 MED ORDER — PNEUMOCOCCAL VAC POLYVALENT 25 MCG/0.5ML IJ INJ: 0.5000 mL | INJECTION | INTRAMUSCULAR | Status: DC

## 2017-02-02 MED ORDER — INFLUENZA VAC SPLIT QUAD 0.5 ML IM SUSY
0.5000 mL | PREFILLED_SYRINGE | INTRAMUSCULAR | Status: AC
  Administered 2017-02-02: 0.5 mL via INTRAMUSCULAR

## 2017-02-02 NOTE — Progress Notes (Signed)
Physical Therapy Session Note  Patient Details  Name: Ian Hill MRN: [id number] Date of Birth: 04-14-1951  Today's Date: 02/02/2017 PT Individual Time: 0915-1015 PT Individual Time Calculation (min): 60 min   Short Term Goals: Week 1:  PT Short Term Goal 1 (Week 1): STG=LTG  Skilled Therapeutic Interventions/Progress Updates: Pt presented in bed agreeable to therapy. Initially c/o back pain but received meds prior to session. Performed bed mobilty mod I with use of features. PTA provided minA for time management donning pants and pt performed sit to stand with supervision and able to pull up pants with supervision for safety. Pt ambulated to ortho gym with SPC min guard fade to supervision with cues for safe use of SPC and attention to task. Performed functional activites including car transfer, gait on compliant surface, and stairs all performed at supervision to mod I level. Pt performed NuStep L 4 x 5 min for L NMR with cues for attention to LUE. Performed dynavision mod B x 3 trials with use of BUE pt most limited in RU quadrant with pt initiating increased dynamic motions while reaching for buttons. Pt returned to room and returned to bed mod I will call bell within reach and needs met.      Therapy Documentation Precautions:  Precautions Precautions: Fall Precaution Comments: cognitive deficits at baseline Restrictions Weight Bearing Restrictions: No General: PT Amount of Missed Time (min): 15 Minutes PT Missed Treatment Reason: Other (Comment) (Breakfast and meds) Vital Signs: Therapy Vitals Pulse Rate: (!) 103 BP: (!) 148/90  See Function Navigator for Current Functional Status.   Therapy/Group: Individual Therapy  Beaumont Austad  Brynne Doane, PTA  02/02/2017, 12:52 PM

## 2017-02-02 NOTE — Progress Notes (Signed)
Occupational Therapy Discharge Summary  Patient Details  Name: Ian Hill MRN: [id number] Date of Birth: Feb 17, 1951    Patient has met 1 of 11 long term goals due to improved activity tolerance, improved balance, postural control, ability to compensate for deficits, functional use of  LEFT upper extremity, improved attention, improved awareness and improved coordination.  Patient to discharge at overall Supervision level.  Patient's care partner is independent to provide the necessary physical and cognitive assistance at discharge.  Patient required supervision prior to admission due to cognitive deficits. Cont to recommend 24 hr supervision at d/c due to pt's L inattention, poor awareness, and decreased awareness of deficits.   Recommendation:  Patient will benefit from ongoing skilled OT services in home health setting to continue to advance functional skills in the area of BADL and Reduce care partner burden.  Equipment: No equipment provided  Reasons for discharge: treatment goals met and discharge from hospital  Patient/family agrees with progress made and goals achieved: Yes  OT Discharge Precautions/Restrictions  Precautions Precautions: Fall Precaution Comments: cognitive deficits at baseline Restrictions Weight Bearing Restrictions: No Vision Vision Assessment?: No apparent visual deficits Perception  Perception: Impaired Body Scheme: Pt droppong items in L hand without awareness throughout session Praxis Praxis: Impaired Cognition Overall Cognitive Status: History of cognitive impairments - at baseline Arousal/Alertness: Awake/alert Orientation Level: Oriented to person;Oriented to place;Disoriented to time Attention: Sustained Focused Attention: Appears intact Sustained Attention: Impaired Sustained Attention Impairment: Functional basic;Verbal basic Memory: Appears intact Awareness: Impaired Awareness Impairment: Emergent impairment Problem Solving:  Impaired Problem Solving Impairment: Functional basic;Verbal basic Behaviors: Impulsive Safety/Judgment: Impaired Comments: Left inattention; decreased awareness of deficits Sensation Sensation Light Touch: Impaired Detail Light Touch Impaired Details: Impaired LUE Proprioception: Impaired Detail Proprioception Impaired Details: Impaired LUE Additional Comments: Unable to formally assess due to cognitive defcits, however, observed defciits during functional tasks.  Coordination Gross Motor Movements are Fluid and Coordinated: Yes Fine Motor Movements are Fluid and Coordinated: No Motor  Motor Motor: Hemiplegia (Simultaneous filing. User may not have seen previous data.) Motor - Skilled Clinical Observations: generalized weakness Motor - Discharge Observations: generalized weakness; atxia/apraxia L UE>LE Mobility  Transfers Sit to Stand: 6: Modified independent (Device/Increase time) Stand to Sit: 6: Modified independent (Device/Increase time)  Trunk/Postural Assessment  Cervical Assessment Cervical Assessment: Exceptions to [facility] Thoracic Assessment Thoracic Assessment: Exceptions to [facility] Lumbar Assessment Lumbar Assessment: Exceptions to [facility] Postural Control Postural Control: Deficits on evaluation Righting Reactions: delayed  Balance Balance Balance Assessed: Yes Dynamic Sitting Balance Dynamic Sitting - Balance Support: During functional activity Dynamic Sitting - Level of Assistance: 5: Stand by assistance Static Standing Balance Static Standing - Balance Support: During functional activity Static Standing - Level of Assistance: 5: Stand by assistance Dynamic Standing Balance Dynamic Standing - Balance Support: During functional activity Dynamic Standing - Level of Assistance: 5: Stand by assistance Extremity/Trunk Assessment RUE Assessment RUE Assessment: Within Functional Limits LUE Assessment LUE Assessment: Within Functional Limits (Strength at functional level,  however, unable to fomally assess due to cognitive impairments)   See Function Navigator for Current Functional Status.  [name], Delise Simenson C 02/02/2017, 12:45 PM

## 2017-02-02 NOTE — Progress Notes (Signed)
Speech Language Pathology Discharge Summary  Patient Details  Name: Ian Hill MRN: [id number] Date of Birth: 26-Mar-1951  Today's Date: 02/02/2017 SLP Individual Time: 0802-0900 SLP Individual Time Calculation (min): 58 min   Skilled Therapeutic Interventions:  Skilled ST services focused on cognitive goals. SLP facilitated basic problem solving tasks pertaining to ADLs demonstrating ability to brush teeth and verbally recalled steps for taking a shower, and making a sandwich with supervision verbal question cues. Pt demonstrated ability to problem solve in the room, pertaining to PO consumption, comfort and calling sister given supervision verbal question cues. Pt demonstrated ability to identify safe/unsafe situations in picture scenes with supervision question cues. SLP provided education for awareness of current deficits, strategies for left inattention and basic problem solving using demonstrations. Pt was left in room in bed with call bell within reach.     Patient has met 4 of 4 long term goals.  Patient to discharge at overall Supervision level.  Reasons goals not met:   N/A  Clinical Impression/Discharge Summary:   Pt demonstrated ability to meet 4 out 4 goals with supervision assistance. Pt demonstrated improvements in functional and verbal basic problem solving skills, sustained attention during problem solving tasks, left attention and emergent awareness. Pt is believed to be at baseline cognition and sister will continue to provide cognitive supervision support as she did piror to hospitalization. Continue to recommend 24hr supervision assistance at discharge due to problem solving and awareness of deficits.  Care Partner:  Caregiver Able to Provide Assistance: Yes  Type of Caregiver Assistance: Cognitive  Recommendation:  24 hour supervision/assistance      Equipment: N/A   Reasons for discharge: Discharged from hospital   Patient/Family Agrees with Progress Made and  Goals Achieved: Yes   Function:  Eating Eating   Modified Consistency Diet: No Eating Assist Level: More than reasonable amount of time;Set up assist for   Eating Set Up Assist For: Opening containers;Cutting food       Cognition Comprehension Comprehension assist level: Follows basic conversation/direction with extra time/assistive device  Expression   Expression assist level: Expresses basic 75 - 89% of the time/requires cueing 10 - 24% of the time. Needs helper to occlude trach/needs to repeat words.  Social Interaction Social Interaction assist level: Interacts appropriately 75 - 89% of the time - Needs redirection for appropriate language or to initiate interaction.  Problem Solving Problem solving assist level: Solves basic 25 - 49% of the time - needs direction more than half the time to initiate, plan or complete simple activities  Memory Memory assist level: Recognizes or recalls 50 - 74% of the time/requires cueing 25 - 49% of the time   Mayara Paulson  [facility] 02/02/2017, 1:26 PM

## 2017-02-02 NOTE — Progress Notes (Signed)
Subjective/Complaints:  Patient states he didn't get dinner yesterday but has no other complaints. Discussed this with nursing manager to verify whether this was actually the case. Patient does have a history of intellectual development disorder  ROS: pt denies nausea, vomiting, diarrhea, cough, shortness of breath or chest pain    Objective: Vital Signs: Blood pressure 102/73, pulse (!) 101, temperature 98.9 F (37.2 C), temperature source Oral, resp. rate 16, height 5\' 10"  (1.778 m), weight 45.4 kg (100 lb), SpO2 100 %. No results found. No results found for this or any previous visit (from the past 72 hour(s)).   HEENT: edentulous Cardio: RRR without murmur. No JVD  Resp: CTA Bilaterally without wheezes or rales. Normal effort  GI: BS positive and NT, ND Extremity:  Pulses positive and No Edema Skin:   Intact. Normal warmth Neuro: Anxious, Normal Sensory, Normal Motor and Inattention Musc/Skel:  Other tenderness aL side above iliac crest Gen NAD   Assessment/Plan: 1. Functional deficits secondary to Right MCA infarct s/p revascularization which require 3+ hours per day of interdisciplinary therapy in a comprehensive inpatient rehab setting. Physiatrist is providing close team supervision and 24 hour management of active medical problems listed below. Physiatrist and rehab team continue to assess barriers to discharge/monitor patient progress toward functional and medical goals. FIM: Function - Bathing Position: Shower Body parts bathed by patient: Right arm, Right upper leg, Left arm, Left upper leg, Chest, Abdomen, Right lower leg, Front perineal area, Left lower leg, Buttocks Body parts bathed by helper: Back Assist Level: Supervision or verbal cues  Function- Upper Body Dressing/Undressing What is the patient wearing?: Pull over shirt/dress Pull over shirt/dress - Perfomed by patient: Thread/unthread right sleeve, Thread/unthread left sleeve, Put head through opening,  Pull shirt over trunk Button up shirt - Perfomed by patient: Thread/unthread left sleeve Button up shirt - Perfomed by helper: Thread/unthread right sleeve, Pull shirt around back, Button/unbutton shirt Assist Level: Supervision or verbal cues Function - Lower Body Dressing/Undressing What is the patient wearing?: Underwear, Pants, Non-skid slipper socks Position: Wheelchair/chair at sink Underwear - Performed by patient: Thread/unthread right underwear leg, Thread/unthread left underwear leg, Pull underwear up/down Pants- Performed by patient: Thread/unthread right pants leg, Thread/unthread left pants leg, Pull pants up/down Pants- Performed by helper: Thread/unthread left pants leg Non-skid slipper socks- Performed by patient: Don/doff right sock, Don/doff left sock Assist for footwear: Supervision/touching assist Assist for lower body dressing: Touching or steadying assistance (Pt > 75%)  Function - Toileting Toileting activity did not occur: No continent bowel/bladder event Toileting steps completed by patient: Adjust clothing prior to toileting, Performs perineal hygiene, Adjust clothing after toileting Toileting Assistive Devices: Grab bar or rail Assist level: Touching or steadying assistance (Pt.75%)  Function - Air cabin crew transfer assistive device: Grab bar, Cane Assist level to toilet: Touching or steadying assistance (Pt > 75%) Assist level from toilet: Touching or steadying assistance (Pt > 75%)  Function - Chair/bed transfer Chair/bed transfer assist level: Touching or steadying assistance (Pt > 75%) Chair/bed transfer details: Verbal cues for safe use of DME/AE  Function - Locomotion: Wheelchair Will patient use wheelchair at discharge?: No Function - Locomotion: Ambulation Assistive device: Cane-straight Max distance: 250 Assist level: Touching or steadying assistance (Pt > 75%) Assist level: Touching or steadying assistance (Pt > 75%) Assist level:  Moderate assist (Pt 50 - 74%) Assist level: Moderate assist (Pt 50 - 74%) Assist level: Supervision or verbal cues  Function - Comprehension Comprehension: Auditory Comprehension assist level: Follows basic  conversation/direction with extra time/assistive device  Function - Expression Expression: Verbal Expression assist level: Expresses basic 90% of the time/requires cueing < 10% of the time.  Function - Social Interaction Social Interaction assist level: Interacts appropriately 90% of the time - Needs monitoring or encouragement for participation or interaction.  Function - Problem Solving Problem solving assist level: Solves basic 25 - 49% of the time - needs direction more than half the time to initiate, plan or complete simple activities  Function - Memory Memory assist level: Recognizes or recalls 50 - 74% of the time/requires cueing 25 - 49% of the time Patient normally able to recall (first 3 days only): That he or she is in a hospital  Medical Problem List and Plan: 1.  Left hemisensory deficits/left hemiparesis secondary to right MCA infarction/right M1 occlusion treated with IV TPA and mechanical embolectomy with revascularization CIR PT, OT, SLP, planned discharge in a.m. 2.  DVT Prophylaxis/Anticoagulation: Eliquis- no bleeding sequelae 3. Pain Management: Tylenol as needed, recent low back pain , normal xray lumbar spine, no complaints today  -headaches better---observe 4. Mood: Provide emotional support 5. Neuropsych: This patient is capable of making decisions on his own behalf. 6. Skin/Wound Care: Routine skin checks 7. Fluids/Electrolytes/Nutrition: Routine I&O with follow-up chemistries examined 8. PAF. Cardiac rate control. Follow-up cardiology services. 9. Hypertension. HCTZ 25 mg daily, Tenormin 50 mg daily. Monitor with increased mobility, reduced HCTZ to 12.5 on 9/20 Vitals:   02/01/17 1500 02/02/17 0500  BP: 98/62 102/73  Pulse: 66 (!) 101  Resp: 16 16   Temp: 98.6 F (37 C) 98.9 F (37.2 C)  SpO2: 100% 100%   10. BPH. Pro scar 5 mg daily, Flomax 0.4 mg daily. Check PVR 3 11. Hyperlipidemia. Zocor 12. Thrombocytopenia: Follow CBC  LOS (Days) 5 A FACE TO FACE EVALUATION WAS PERFORMED  Ian Hill E 02/02/2017, 9:03 AM

## 2017-02-02 NOTE — Discharge Summary (Signed)
NAME:  Ian Hill, Ian Hill                  ACCOUNT NO.:  MEDICAL RECORD NO.:  [id number]  LOCATION:                                 FACILITY:  PHYSICIAN:  Charlett Blake, M.D.DATE OF BIRTH:  03/20/1947  DATE OF ADMISSION:  01/28/2017 DATE OF DISCHARGE:  02/03/2017                              DISCHARGE SUMMARY   DISCHARGE DIAGNOSES: 1. Right middle cerebral artery infarction with M1 occlusion, status     post tPA and mechanical embolectomy. 2. Eliquis for deep vein thrombosis prophylaxis. 3. Paroxysmal atrial fibrillation. 4. Hypertension. 5. Benign prostatic hypertrophy. 6. Hyperlipidemia. 7. Thrombocytopenia.  HISTORY OF PRESENT ILLNESS:  This is a 66 year old right-handed male with history of hypertension, mentally delayed, lives with his sister, performs his own ADLs.  Uses a cane.  Presented on January 23, 2017, with left-sided weakness and facial droop with left-sided neglect. Cranial CT scan showed no acute processes, there was an old right frontal lobe infarction.  The patient did receive tPA.  MRI showed cortical diffusion restriction throughout the posterior right MCA territory consistent with acute infarction, no hemorrhage or mass effect.  CTA of head and neck showed stenosis, estimated 50%, in both siphon regions.  Cerebral angiogram, occluded right MCA inferior and superior divisions.  Underwent revascularization by Interventional Radiology.  Echocardiogram with ejection fraction of 96%, grade 2 diastolic dysfunction.  Bouts of atrial fibrillation with rate controlled.  Mildly elevated troponin, 0.19.  Cardiology Service is consulted.  Initial plan for TEE later canceled, plan ongoing with Eliquis for CVA prophylaxis.  There was some consideration of outpatient stress test.  Tolerating a regular diet.  Physical and occupational therapy ongoing.  The patient was admitted for a comprehensive rehab program.  PAST MEDICAL HISTORY:  See discharge  diagnoses.  SOCIAL HISTORY:  Lives with sister.  Used a cane prior to admission.  Functional status upon admission to rehab services was minimal assist, 120 feet, rolling walker; minimal assist, sit to stand; min-to-mod assist, activities of daily living.  PHYSICAL EXAMINATION:  VITAL SIGNS:  Blood pressure 112/61, pulse 101, temperature 97, and respirations 18. GENERAL:  Alert male, in no acute distress. HEENT:  EOMs intact.  He was without dentures. NECK:  Supple.  Nontender.  No JVD. LUNGS:  Clear to auscultation without wheeze. CARDIAC:  Rate controlled. ABDOMEN:  Soft, nontender.  Good bowel sounds. NEUROLOGIC:  He was dysarthric, followed full commands.  Limited insight to his deficits.  REHABILITATION HOSPITAL COURSE:  The patient was admitted to Inpatient Rehab Services.  Therapies initiated on a 3-hour daily basis consisting of physical therapy, occupational therapy, and rehabilitation nursing as well as speech therapy.  The following issues were addressed during the patient's rehabilitation stay.  Pertaining to Ian Hill' right MCA infarction, he had received tPA as well as underwent mechanical embolectomy, revascularization.  He remained on Eliquis.  Blood pressure is well controlled.  Cardiac rate monitored.  He would follow up outpatient with Cardiology Services.  He continued on hydrochlorothiazide as well as Tenormin, maintained on Proscar as well as Flomax for history of benign prostatic hypertrophy.  He was voiding without difficulty.  Hyperlipidemia, continued on Zocor.  The patient  received weekly collaborative interdisciplinary team conferences to discuss estimated length of stay, family teaching, any barriers to his discharge.  The patient was ambulating greater than 250 feet, straight- point cane, close supervision.  Working with obstacles and negotiation with minimal assistance.  He did need some cueing to monitor his left side.  He could place his shoes  on and off supervision, ambulates to his therapy sessions with close supervision.  Stands to dress his pants and lower garments.  Full family teaching was completed and plan of discharge to home.  DISCHARGE MEDICATIONS:  Included, 1. Eliquis 5 mg p.o. b.i.d. 2. Tenormin 50 mg p.o. daily. 3. Proscar 5 mg p.o. daily. 4. Zocor 40 mg p.o. daily. 5. Flomax 0.4 mg p.o. daily. 6. Hydrochlorothiazide 12.5 mg p.o. daily.  DIET:  His diet was regular.  FOLLOWUP:  The patient would follow up with Dr. Alysia Penna at the [facility] as directed; Dr. Leonie Hill, Neurology Service, call for appointment; Dr. Curt Bears, Cardiology Service, call for appointment, consider outpatient TEE and stress test; and Dr. [name], medical management.     Lauraine Rinne, P.A.   ______________________________ Charlett Blake, M.D.    DA/MEDQ  D:  02/02/2017  T:  02/02/2017  Job:  [id number]  cc:   Dr. Marquette Saa, M.D. Ian P. Leonie Man, MD

## 2017-02-02 NOTE — Progress Notes (Signed)
Occupational Therapy Session Note  Patient Details  Name: Clancy Mullarkey MRN: [id number] Date of Birth: 16-Nov-1950  Today's Date: 02/02/2017 OT Individual Time: 1100-1155 OT Individual Time Calculation (min): 55 min    Short Term Goals: Week 1:  OT Short Term Goal 1 (Week 1): STG=LTG due to LOS  Skilled Therapeutic Interventions/Progress Updates:    Pt seen for OT ADL bathing/dressing session. Pt in bed upon arrival, with encouragement to participate in bathing at shower level.  Throughout session, he ambulated with supervision, intermittently using SPC. Due to baseline cognitive impairments, pt with difficulty following cues for proper use of SPC and for transitioning into walk in shower. He bathed seated on tub bench, VCs for attention to all areas. During bathing and grooming tasks, pt actively using R UE at dominant level, however, is unable to maintain functional use of extremity during divided attention. He dressed seated on toilet, required VCs for problem solving clothing orientation. He completed grooming tasks standing at sink. Then ambulated throughout unit, focusing on dual attention and ambulation in highly stimulating environment.  Pt returned to room at end of session, left seated in recliner with all needs in reach, QRB donned.   Therapy Documentation Precautions:  Precautions Precautions: Fall Precaution Comments: cognitive deficits at baseline Restrictions Weight Bearing Restrictions: No Pain:   No/ denies pain  See Function Navigator for Current Functional Status.   Therapy/Group: Individual Therapy  Lewis, Liam Cammarata C 02/02/2017, 7:18 AM

## 2017-02-02 NOTE — Discharge Summary (Signed)
Discharge summary job [id number]

## 2017-02-03 ENCOUNTER — Ambulatory Visit (HOSPITAL_COMMUNITY): Payer: Medicare Other

## 2017-02-03 ENCOUNTER — Encounter (HOSPITAL_COMMUNITY): Payer: Self-pay

## 2017-02-03 DIAGNOSIS — Z23 Encounter for immunization: Secondary | ICD-10-CM | POA: Diagnosis not present

## 2017-02-03 MED ORDER — ATENOLOL 50 MG PO TABS: 50.0000 mg | ORAL_TABLET | Freq: Every day | ORAL | 0 refills | Status: DC

## 2017-02-03 MED ORDER — APIXABAN 5 MG PO TABS: 5.0000 mg | ORAL_TABLET | Freq: Two times a day (BID) | ORAL | 0 refills | Status: DC

## 2017-02-03 MED ORDER — HYDROCHLOROTHIAZIDE 12.5 MG PO TABS: 12.5000 mg | ORAL_TABLET | Freq: Every day | ORAL | 0 refills | Status: DC

## 2017-02-03 MED ORDER — FINASTERIDE 5 MG PO TABS: 5.0000 mg | ORAL_TABLET | Freq: Every day | ORAL | 0 refills | Status: AC

## 2017-02-03 MED ORDER — SIMVASTATIN 40 MG PO TABS: 40.0000 mg | ORAL_TABLET | ORAL | 0 refills | Status: DC

## 2017-02-03 MED ORDER — TAMSULOSIN HCL 0.4 MG PO CAPS: 0.4000 mg | ORAL_CAPSULE | Freq: Every day | ORAL | 0 refills | Status: AC

## 2017-02-03 NOTE — Patient Care Conference (Signed)
Inpatient RehabilitationTeam Conference and Plan of Care Update Date: 02/03/2017   Time: 12:10 PM    Patient Name: Ian Hill      Medical Record Number: [id number]  Date of Birth: 08/09/1950 Sex: Male         Room/Bed: 4W25C/4W25C-01 Payor Info: Payor: [employer] MEDICARE / Plan: [employer] MEDICARE / Product Type: *No Product type* /    Admitting Diagnosis: Stroke R MCA CVA  Admit Date/Time:  01/28/2017  5:33 PM Admission Comments: No comment available   Primary Diagnosis:  Gait disturbance, post-stroke Principal Problem: Gait disturbance, post-stroke  Patient Active Problem List   Diagnosis Date Noted  . Left-sided neglect 01/30/2017  . Gait disturbance, post-stroke 01/30/2017  . Left hemiparesis (HCC)   . PAF (paroxysmal atrial fibrillation) (HCC)   . Benign prostatic hyperplasia   . Hyperlipidemia   . Thrombocytopenia (HCC)   . Protein-calorie malnutrition, severe 01/24/2017  . Acute right arterial ischemic stroke, middle cerebral artery (MCA) (HCC) 01/23/2017  . Acute respiratory failure (HCC) 01/23/2017  . Essential hypertension 01/23/2017  . Acute metabolic encephalopathy 01/23/2017  . Endotracheal tube present   . Cerebrovascular accident (CVA) due to occlusion of right middle cerebral artery (HCC) - Right middle cerebral artery Ian infarct do to right M1 occlusion treated with IV TPA and mechanical embolect   . Stage 3 chronic kidney disease     Expected Discharge Date: Expected Discharge Date: 02/03/17  Team Members Present: Physician leading conference: Dr. Andrew Kirsteins Social Worker Present: Jenny , LCSW Nurse Present: Deborah Sharp, RN PT Present: Rosita Dechalus, PTA OT Present: Amy Lewis, OT SLP Present: Madison Cratch, SLP PPS Coordinator present : Marie Noel, RN, CRRN     Current Status/Progress Goal Weekly Team Focus  Medical   Vital signs have been stable. Blood pressure occasionally elevated heart rate, occasional mild elevation   Maintain medical stability during rehabilitation stay and transition to home environment  Discharge planning   Bowel/Bladder   continent of B+B, occ spills urinal  maintain continence of bladder with medication  assess need for and call for assistance with urinal and laxative   Swallow/Nutrition/ Hydration             ADL's   Supervision- mod I  overall  supervision-mod I overall  tx goals met; pt to d/c home with sister tomorrow   Mobility   transfers, bed mobilty, mod I, gait with SPC supervision  supervision to mod I overall  goals met, pt to be  d/c to sister's house 9/25   Communication             Safety/Cognition/ Behavioral Observations  basic skilled supervision with baseline cognitive deficits   supervision  basic problem solving, emergent awareness, safety, left attention    Pain   Tylenol prn  manage pain at or below level 3 with prn medication  assess need for and effectiveness of prn meds   Skin   n/a          Rehab Goals Patient on target to meet rehab goals: Yes Rehab Goals Revised: none *See Care Plan and progress notes for long and short-term goals.     Barriers to Discharge  Current Status/Progress Possible Resolutions Date Resolved   Physician    Other (comments)  Chronic intellectual disability  Progressing towards discharge  Discharge in a.m.      Nursing  Incontinence  education of caregiver and reminder to pt to call for assist reports understanding of information            PT                    OT                  SLP                SW                Discharge Planning/Teaching Needs:  Pt to return to his sister's home where they have lived together for years.  Pt's sister has visited pt regularly on CIR and is used to providing pt with 24/7 supervision.   Team Discussion:  Pt is doing well functionally and is at baseline per family.  He will need 24/7 supervision and sister plans to provide this and when she needs to be away, another  sibling will help her.  HH for PT/OT/ST.  Revisions to Treatment Plan:  none    Continued Need for Acute Rehabilitation Level of Care: The patient requires daily medical management by a physician with specialized training in physical medicine and rehabilitation for the following conditions: Daily direction of a multidisciplinary physical rehabilitation program to ensure safe treatment while eliciting the highest outcome that is of practical value to the patient.: Yes Daily medical management of patient stability for increased activity during participation in an intensive rehabilitation regime.: Yes Daily analysis of laboratory values and/or radiology reports with any subsequent need for medication adjustment of medical intervention for : Neurological problems;Mood/behavior problems  Ian Hill, Ian Hill 02/03/2017, 12:10 PM

## 2017-02-03 NOTE — Progress Notes (Signed)
Pt D/Ced to home accompanied by sister. VSS, denies pain, sister verbalizes understanding of D/C instructions given by PAC.

## 2017-02-03 NOTE — Progress Notes (Signed)
Social Work Discharge Note  The overall goal for the admission was met for:   Discharge location: Yes - to his home with his sister  Length of Stay: Yes - 6 days  Discharge activity level: Yes - supervision  Home/community participation: Yes  Services provided included: MD, RD, PT, OT, SLP, RN, Pharmacy and SW  Financial Services: Medicaid and Private Insurance: [employer] Medicare  Follow-up services arranged: Home Health: PT/OT/ST from [facility] - Pt did not need any additional DME.  Has a cane. and Patient/Family has no preference for HH/DME agencies  Comments (or additional information):  Team felt pt was ready for d/c.  CSW informed pt's sister last week of upcoming d/c today and she was pleased he is doing well.  She and her brother came to pick pt up and CSW explained that [facility] will call to set up appts for PT/OT/ST at home.  Sister was appreciative of this and care pt received on CIR.  They feel prepared to care for pt at home and other siblings will help to give sister (Ian Hill) a break, as needed.    Patient/Family verbalized understanding of follow-up arrangements: Yes  Individual responsible for coordination of the follow-up plan: pt's sister  Confirmed correct DME delivered: ,  [employer] 02/03/2017    ,  [employer] 

## 2017-02-03 NOTE — Progress Notes (Signed)
Subjective/Complaints:  No issues overnite  ROS: pt denies nausea, vomiting, diarrhea, cough, shortness of breath or chest pain    Objective: Vital Signs: Blood pressure 115/79, pulse 98, temperature 98.2 F (36.8 C), temperature source Oral, resp. rate 20, height 5\' 10"  (1.778 m), weight 45.4 kg (100 lb), SpO2 100 %. No results found. No results found for this or any previous visit (from the past 72 hour(s)).   HEENT: edentulous Cardio: RRR without murmur. No JVD  Resp: CTA Bilaterally without wheezes or rales. Normal effort  GI: BS positive and NT, ND Extremity:  Pulses positive and No Edema Skin:   Intact. Normal warmth Neuro: Anxious, Normal Sensory, Normal Motor and Inattention Musc/Skel:  Other tenderness aL side above iliac crest Gen NAD   Assessment/Plan: 1. Functional deficits secondary to Right MCA infarct Stable for D/C today F/u PCP in 3-4 weeks F/u PM&R 2 weeks See D/C summary See D/C instructionsFIM: Function - Bathing Position: Shower Body parts bathed by patient: Right arm, Right upper leg, Left arm, Left upper leg, Chest, Abdomen, Right lower leg, Front perineal area, Left lower leg, Buttocks Body parts bathed by helper: Back Bathing not applicable: Back Assist Level: Supervision or verbal cues  Function- Upper Body Dressing/Undressing What is the patient wearing?: Hospital gown Pull over shirt/dress - Perfomed by patient: Thread/unthread right sleeve, Put head through opening, Pull shirt over trunk, Thread/unthread left sleeve Button up shirt - Perfomed by patient: Thread/unthread left sleeve Button up shirt - Perfomed by helper: Thread/unthread right sleeve, Pull shirt around back, Button/unbutton shirt Assist Level: Supervision or verbal cues Function - Lower Body Dressing/Undressing What is the patient wearing?: Underwear, Pants, Non-skid slipper socks Position: Other (comment) (sitting on toilet) Underwear - Performed by patient: Thread/unthread  right underwear leg, Thread/unthread left underwear leg, Pull underwear up/down Pants- Performed by patient: Thread/unthread right pants leg, Thread/unthread left pants leg, Pull pants up/down Pants- Performed by helper: Thread/unthread left pants leg Non-skid slipper socks- Performed by patient: Don/doff right sock, Don/doff left sock Assist for footwear: Supervision/touching assist Assist for lower body dressing: Supervision or verbal cues  Function - Toileting Toileting activity did not occur: No continent bowel/bladder event Toileting steps completed by patient: Adjust clothing prior to toileting, Performs perineal hygiene, Adjust clothing after toileting Toileting Assistive Devices: Grab bar or rail Assist level: Supervision or verbal cues  Function - Air cabin crew transfer assistive device: Grab bar, Cane Assist level to toilet: Supervision or verbal cues Assist level from toilet: Supervision or verbal cues  Function - Chair/bed transfer Chair/bed transfer assist level: No Help, no cues, assistive device, takes more than a reasonable amount of time Chair/bed transfer details: Verbal cues for safe use of DME/AE  Function - Locomotion: Wheelchair Will patient use wheelchair at discharge?: No Function - Locomotion: Ambulation Assistive device: Cane-straight Max distance: 250 Assist level: Supervision or verbal cues Assist level: Supervision or verbal cues Assist level: Supervision or verbal cues Assist level: Supervision or verbal cues Assist level: Supervision or verbal cues  Function - Comprehension Comprehension: Auditory Comprehension assist level: Follows basic conversation/direction with extra time/assistive device  Function - Expression Expression: Verbal Expression assist level: Expresses basic 75 - 89% of the time/requires cueing 10 - 24% of the time. Needs helper to occlude trach/needs to repeat words.  Function - Social Interaction Social Interaction  assist level: Interacts appropriately 75 - 89% of the time - Needs redirection for appropriate language or to initiate interaction.  Function - Problem Solving Problem solving assist  level: Solves basic 25 - 49% of the time - needs direction more than half the time to initiate, plan or complete simple activities  Function - Memory Memory assist level: Recognizes or recalls 50 - 74% of the time/requires cueing 25 - 49% of the time Patient normally able to recall (first 3 days only): That he or she is in a hospital  Medical Problem List and Plan: 1.  Left hemisensory deficits/left hemiparesis secondary to right MCA infarction/right M1 occlusion treated with IV TPA and mechanical embolectomy with revascularization CIR PT, OT, SLP, planned discharge today 2.  DVT Prophylaxis/Anticoagulation: Eliquis- no bleeding sequelae 3. Pain Management: Tylenol as needed, recent low back pain , normal xray lumbar spine, no complaints today  4. Mood: Provide emotional support 5. Neuropsych: This patient is capable of making decisions on his own behalf. 6. Skin/Wound Care: Routine skin checks 7. Fluids/Electrolytes/Nutrition: Routine I&O with follow-up chemistries examined 8. PAF. Cardiac rate control. Follow-up cardiology services. 9. Hypertension. HCTZ 25 mg daily, Tenormin 50 mg daily. Monitor with increased mobility, reduced HCTZ to 12.5 on 9/20 Vitals:   02/02/17 2057 02/03/17 0513  BP: (!) 91/58 115/79  Pulse:  98  Resp:  20  Temp:  98.2 F (36.8 C)  SpO2: 94% 100%   10. BPH. Pro scar 5 mg daily, Flomax 0.4 mg daily. Check PVR 3 11. Hyperlipidemia. Zocor 12. Thrombocytopenia: Follow CBC  LOS (Days) 6 A FACE TO FACE EVALUATION WAS PERFORMED  Ian Hill E 02/03/2017, 8:32 AM

## 2017-02-03 NOTE — Discharge Instructions (Signed)
Inpatient Rehab Discharge Instructions  Amahri Dengel Discharge date and time: No discharge date for patient encounter.   Activities/Precautions/ Functional Status: Activity: activity as tolerated Diet: regular diet Wound Care: none needed Functional status:  ___ No restrictions     ___ Walk up steps independently ___ 24/7 supervision/assistance   ___ Walk up steps with assistance ___ Intermittent supervision/assistance  ___ Bathe/dress independently ___ Walk with walker     _x STROKE/TIA DISCHARGE INSTRUCTIONS SMOKING Cigarette smoking nearly doubles your risk of having a stroke & is the single most alterable risk factor  If you smoke or have smoked in the last 12 months, you are advised to quit smoking for your health.  Most of the excess cardiovascular risk related to smoking disappears within a year of stopping.  Ask you doctor about anti-smoking medications  [place] Quit Line: 1-800-QUIT NOW  Free Smoking Cessation Classes [phone]  CHOLESTEROL Know your levels; limit fat & cholesterol in your diet  Lipid Panel     Component Value Date/Time   CHOL 135 01/24/2017 0604   TRIG 93 01/24/2017 0604   HDL 48 01/24/2017 0604   CHOLHDL 2.8 01/24/2017 0604   VLDL 19 01/24/2017 0604   LDLCALC 68 01/24/2017 0604      Many patients benefit from treatment even if their cholesterol is at goal.  Goal: Total Cholesterol (CHOL) less than 160  Goal:  Triglycerides (TRIG) less than 150  Goal:  HDL greater than 40  Goal:  LDL (LDLCALC) less than 100   BLOOD PRESSURE American Stroke Association blood pressure target is less that 120/80 mm/Hg  Your discharge blood pressure is:  BP: 100/72  Monitor your blood pressure  Limit your salt and alcohol intake  Many individuals will require more than one medication for high blood pressure  DIABETES (A1c is a blood sugar average for last 3 months) Goal HGBA1c is under 7% (HBGA1c is blood sugar average for last 3 months)   Diabetes: No known diagnosis of diabetes    Lab Results  Component Value Date   HGBA1C 5.7 (H) 01/24/2017     Your HGBA1c can be lowered with medications, healthy diet, and exercise.  Check your blood sugar as directed by your physician  Call your physician if you experience unexplained or low blood sugars.  PHYSICAL ACTIVITY/REHABILITATION Goal is 30 minutes at least 4 days per week  Activity: Increase activity slowly, Therapies: Physical Therapy: Home Health Return to work:   Activity decreases your risk of heart attack and stroke and makes your heart stronger.  It helps control your weight and blood pressure; helps you relax and can improve your mood.  Participate in a regular exercise program.  Talk with your doctor about the best form of exercise for you (dancing, walking, swimming, cycling).  DIET/WEIGHT Goal is to maintain a healthy weight  Your discharge diet is: Diet regular Room service appropriate? Yes; Fluid consistency: Thin  liquids Your height is:    Your current weight is:   Your Body Mass Index (BMI) is:     Following the type of diet specifically designed for you will help prevent another stroke.  Your goal weight range is:    Your goal Body Mass Index (BMI) is 19-24.  Healthy food habits can help reduce 3 risk factors for stroke:  High cholesterol, hypertension, and excess weight.  RESOURCES Stroke/Support Group:  Call [phone]   STROKE EDUCATION PROVIDED/REVIEWED AND GIVEN TO PATIENT Stroke warning signs and symptoms How to activate  emergency medical system (call 911). Medications prescribed at discharge. Need for follow-up after discharge. Personal risk factors for stroke. Pneumonia vaccine given:  Flu vaccine given:  My questions have been answered, the writing is legible, and I understand these instructions.  I will adhere to these goals & educational materials that have been provided to me after my discharge from the hospital.   __  Bathe/dress with assistance ___ Walk Independently    ___ Shower independently ___ Walk with assistance    ___ Shower with assistance ___ No alcohol     ___ Return to work/school ________  COMMUNITY REFERRALS UPON DISCHARGE:   Home Health:   PT     OT     ST     Agency:  [facility] Phone:  [phone] Medical Equipment/Items Ordered:  None needed   GENERAL COMMUNITY RESOURCES FOR PATIENT/FAMILY: Support Groups:  [facility] Stroke Support Group                              Meets the 2nd Thursday of each month (except June, July, and August) from 3-4PM                              At [place] on [address]                              For more information, call [phone]  Special Instructions:    My questions have been answered and I understand these instructions. I will adhere to these goals and the provided educational materials after my discharge from the hospital.  Patient/Caregiver Signature _______________________________ Date __________  Clinician Signature _______________________________________ Date __________  Please bring this form and your medication list with you to all your follow-up doctor's appointments.

## 2017-02-05 ENCOUNTER — Other Ambulatory Visit: Payer: Self-pay | Admitting: Urology

## 2017-02-05 DIAGNOSIS — C61 Malignant neoplasm of prostate: Secondary | ICD-10-CM

## 2017-02-05 NOTE — Progress Notes (Signed)
Physical Therapy Discharge Summary  Late Entry Note  Patient Details  Name: Ian Hill MRN: [id number] Date of Birth: 1951-04-29  Pt discharged on 02/03/17.    Patient has met 4 of 7 long term goals due to improved activity tolerance and improved balance.  Patient to discharge at an ambulatory level Supervision.   Patient's care partner is his sister; per Sonia Baller CSW note, sister feels comfortable supervising pt.  Family will provide the necessary cognitive assistance at discharge. In his familiar environment at home, pt will be less distracted and probably demonstrate better standing balance for function and ambulation.  Reasons goals not met: premorbid cognitive delays, reluctance to participate in high level balance training at this time.  Recommendation:  Patient will benefit from ongoing skilled PT services in home health setting to continue to advance safe functional mobility, address ongoing impairments in balance, activity tolerance, and minimize fall risk.  Equipment: No equipment provided  Reasons for discharge: treatment goals met and discharge from hospital  Patient/family agrees with progress made and goals achieved: Yes  PT Discharge Precautions/Restrictions- fall; premorbid cognitive deficits      Balance- tested 01/31/17   Standardized Balance Assessment Standardized Balance Assessment: Dynamic Gait Index Berg Balance Test Sit to Stand: Able to stand  independently using hands Standing Unsupported: Able to stand 2 minutes with supervision Sitting with Back Unsupported but Feet Supported on Floor or Stool: Able to sit safely and securely 2 minutes Stand to Sit: Sits safely with minimal use of hands Transfers: Able to transfer safely, definite need of hands Standing Unsupported with Eyes Closed: Able to stand 3 seconds Standing Ubsupported with Feet Together: Able to place feet together independently but unable to hold for 30 seconds From Standing, Reach  Forward with Outstretched Arm: Can reach confidently >25 cm (10") From Standing Position, Pick up Object from Floor: Able to pick up shoe, needs supervision From Standing Position, Turn to Look Behind Over each Shoulder: Looks behind one side only/other side shows less weight shift Turn 360 Degrees: Able to turn 360 degrees safely one side only in 4 seconds or less Standing Unsupported, Alternately Place Feet on Step/Stool: Able to stand independently and safely and complete 8 steps in 20 seconds Standing Unsupported, One Foot in Front: Able to take small step independently and hold 30 seconds Standing on One Leg: Able to lift leg independently and hold 5-10 seconds Total Score: 43 Dynamic Gait Index Level Surface: Mild Impairment Change in Gait Speed: Mild Impairment Gait with Horizontal Head Turns: Mild Impairment Gait with Vertical Head Turns: Mild Impairment Gait and Pivot Turn: Mild Impairment Step Over Obstacle: Mild Impairment Step Around Obstacles: Mild Impairment Steps: Mild Impairment Total Score: 16         See Function Navigator for Current Functional Status.  Shekelia Boutin 02/05/2017, 2:29 PM

## 2017-02-13 ENCOUNTER — Telehealth (HOSPITAL_COMMUNITY): Payer: Self-pay

## 2017-02-13 NOTE — Telephone Encounter (Signed)
Called to schedule f/u, left message for sister to return call. AW

## 2017-02-20 ENCOUNTER — Ambulatory Visit (HOSPITAL_COMMUNITY)
Admission: RE | Admit: 2017-02-20 | Discharge: 2017-02-20 | Disposition: A | Discharge status: Home / Self Care | Payer: Medicare Other | Source: Ambulatory Visit | Attending: Urology | Admitting: Urology

## 2017-02-20 DIAGNOSIS — S2232XD Fracture of one rib, left side, subsequent encounter for fracture with routine healing: Secondary | ICD-10-CM | POA: Insufficient documentation

## 2017-02-20 DIAGNOSIS — C61 Malignant neoplasm of prostate: Secondary | ICD-10-CM | POA: Diagnosis not present

## 2017-02-20 DIAGNOSIS — X58XXXD Exposure to other specified factors, subsequent encounter: Secondary | ICD-10-CM | POA: Insufficient documentation

## 2017-02-20 MED ORDER — TECHNETIUM TC 99M MEDRONATE IV KIT
25.0000 | PACK | Freq: Once | INTRAVENOUS | Status: AC | PRN
  Administered 2017-02-20: 20.2 via INTRAVENOUS

## 2017-02-25 ENCOUNTER — Ambulatory Visit (INDEPENDENT_AMBULATORY_CARE_PROVIDER_SITE_OTHER): Payer: Medicare Other | Admitting: Internal Medicine

## 2017-02-25 ENCOUNTER — Telehealth (HOSPITAL_COMMUNITY): Payer: Self-pay

## 2017-02-25 DIAGNOSIS — Z8546 Personal history of malignant neoplasm of prostate: Secondary | ICD-10-CM

## 2017-02-25 DIAGNOSIS — Z5189 Encounter for other specified aftercare: Secondary | ICD-10-CM

## 2017-02-25 DIAGNOSIS — I248 Other forms of acute ischemic heart disease: Secondary | ICD-10-CM | POA: Diagnosis not present

## 2017-02-25 DIAGNOSIS — Z7901 Long term (current) use of anticoagulants: Secondary | ICD-10-CM

## 2017-02-25 DIAGNOSIS — I63411 Cerebral infarction due to embolism of right middle cerebral artery: Secondary | ICD-10-CM

## 2017-02-25 DIAGNOSIS — F17210 Nicotine dependence, cigarettes, uncomplicated: Secondary | ICD-10-CM | POA: Diagnosis not present

## 2017-02-25 DIAGNOSIS — Z8673 Personal history of transient ischemic attack (TIA), and cerebral infarction without residual deficits: Secondary | ICD-10-CM

## 2017-02-25 DIAGNOSIS — E785 Hyperlipidemia, unspecified: Secondary | ICD-10-CM | POA: Diagnosis not present

## 2017-02-25 DIAGNOSIS — I422 Other hypertrophic cardiomyopathy: Secondary | ICD-10-CM

## 2017-02-25 DIAGNOSIS — I4892 Unspecified atrial flutter: Secondary | ICD-10-CM

## 2017-02-25 DIAGNOSIS — I10 Essential (primary) hypertension: Secondary | ICD-10-CM | POA: Diagnosis not present

## 2017-02-25 DIAGNOSIS — Z79899 Other long term (current) drug therapy: Secondary | ICD-10-CM

## 2017-02-25 MED ORDER — SENNOSIDES-DOCUSATE SODIUM 8.6-50 MG PO TABS: 2.0000 | ORAL_TABLET | Freq: Every day | ORAL | 5 refills | Status: DC

## 2017-02-25 MED ORDER — APIXABAN 5 MG PO TABS: 5.0000 mg | ORAL_TABLET | Freq: Two times a day (BID) | ORAL | 2 refills | Status: DC

## 2017-02-25 MED ORDER — ATENOLOL 50 MG PO TABS: 50.0000 mg | ORAL_TABLET | ORAL | 5 refills | Status: DC

## 2017-02-25 MED ORDER — SIMVASTATIN 40 MG PO TABS: 40.0000 mg | ORAL_TABLET | Freq: Every day | ORAL | 5 refills | Status: DC

## 2017-02-25 MED ORDER — HYDROCHLOROTHIAZIDE 25 MG PO TABS: ORAL_TABLET | ORAL | 5 refills | Status: DC

## 2017-02-25 MED ORDER — POLYETHYLENE GLYCOL 3350 17 G PO PACK: 17.0000 g | PACK | Freq: Every day | ORAL | 0 refills | Status: DC | PRN

## 2017-02-25 MED ORDER — TAMSULOSIN HCL 0.4 MG PO CAPS: 0.4000 mg | ORAL_CAPSULE | Freq: Every day | ORAL | 5 refills | Status: DC

## 2017-02-25 MED ORDER — ENSURE ENLIVE PO LIQD
237.0000 mL | Freq: Two times a day (BID) | ORAL | 12 refills | Status: DC
Start: 2017-02-25 — End: 2018-12-06

## 2017-02-25 MED ORDER — HYDROCHLOROTHIAZIDE 12.5 MG PO TABS: 12.5000 mg | ORAL_TABLET | Freq: Every day | ORAL | 5 refills | Status: DC

## 2017-02-25 MED ORDER — FINASTERIDE 5 MG PO TABS: 5.0000 mg | ORAL_TABLET | Freq: Every day | ORAL | 5 refills | Status: DC

## 2017-02-25 MED ORDER — ATENOLOL 25 MG PO TABS: 25.0000 mg | ORAL_TABLET | ORAL | 5 refills | Status: DC

## 2017-02-25 NOTE — Patient Instructions (Addendum)
Mr. Shimabukuro,  It was a pleasure to see you. Please continue to take your medication as previously prescribed. I have sent refills to your pharmacy. Please follow up with your primary care doctor in 3 months. If you have any questions or concerns, call our clinic at [phone] or after hours call [phone] and ask for the internal medicine resident on call. Thank you!  - Dr. Philipp Ovens

## 2017-02-25 NOTE — Telephone Encounter (Signed)
Called to schedule f/u, left vm. AW

## 2017-02-25 NOTE — Progress Notes (Signed)
   Please note Duplicate MEDICAL RECORD NUMBERMRN [id number]   CC: CVA follow up   HPI:  [name] is a 66 y.o. male with past medical history outlined below here for hospital follow up of CVA. For the details of today's visit, please refer to the assessment and plan.   Past Medical History:  Diagnosis Date  . BPH (benign prostatic hyperplasia)   . CKD (chronic kidney disease), stage II   . Diverticulosis   . Dyslipidemia   . Elevated PSA   . Hypertension   . Hypertrophic obstructive cardiomyopathy with diastolic heart failure ([place])   . Mitral regurgitation   . Urine retention     Review of Systems  Eyes: Negative for blurred vision.  Neurological: Negative for sensory change and weakness.    Physical Exam:  Vitals:   02/25/17 1311  BP: 133/77  Pulse: 67  Temp: 98.2 F (36.8 C)  TempSrc: Oral  SpO2: 97%  Weight: 119 lb 11.2 oz (54.3 kg)    Constitutional: NAD, appears comfortable Cardiovascular: RRR, no murmurs, rubs, or gallops.  Pulmonary/Chest: CTAB, no wheezes, rales, or rhonchi. Extremities: Warm and well perfused. Distal pulses intact.  Neurological: A&Ox3, CN II - XII grossly intact. Bilateral strength and sensation intact.  Psychiatric: Normal mood and affect  Assessment & Plan:   See Encounters Tab for problem based charting.  Patient discussed with Dr. Lynnae January

## 2017-02-26 DIAGNOSIS — C61 Malignant neoplasm of prostate: Secondary | ICD-10-CM | POA: Insufficient documentation

## 2017-02-26 NOTE — Assessment & Plan Note (Signed)
Refilled finasteride and tamsulosin.

## 2017-02-26 NOTE — Assessment & Plan Note (Signed)
BP well controlled, 133/77. Continue HCTZ 12.5 mg daily and atenolol 50 mg daily. Refills sent to pharamcy.

## 2017-02-26 NOTE — Assessment & Plan Note (Signed)
*  Please note Duplicate MEDICAL RECORD Oren Binet [id number] *  Mr. Ian Hill is here for hospital follow-up. He was discharged on 01/28/2017 after hospitalization for right MCA occlusion requiring IV TPA and mechanical embolectomy. He presented to the ED after his wife heard him fall in the bathroom and found him weak and confused. He was brought to the ED as a code stroke. On arrival, neurology noted deficits of left hemiplegia, left facial droop, rightward eye deviation, and left hemi neglect. STAT CT head revealed a hyperdense appearance of the right MCA suspicious for acute thrombus. Follow-up MRI confirmed findings of the posterior right MCA acute infarct as well as punctate foci within the superior right frontal lobe and within the left occipital lobe spanning multiple vascular territories concerning for an embolic storke. He was managed with IV TPA and surgical revascularization of his occluded right MCA per IR. He was persistently altered requiring intubation while in the ED and was admitted to the ICU. Cardiolgoy was consulted for inverted T waves on EKG and mildly elevated troponins. Cardiology felt this was due to his stroke and demand ischemia rather than ACS. He was extubated on day 2 of hospitalization. Echocardiogram failed to reveal any embolic source. He has a history of hypertrophic cardiomyopathy and this was noted echo as well. EF was normal, 55-60% with normal wall motion and grade 2 diastolic dysfunction. Plan was for follow up with TEE and loop recorder, however patient was unable to tolerate TEE due to ongoing issues with AMS and agitation, and it was ultimately canceled. Unclear if patient received ILC. Per cardiology note, patient was noted to be in atrial flutter at some unclear point during hospitalization. Cards documented CHADS2VASc score of at least 3, recommending life long anticoagulation. Patient was discharged on Eliquis 5 mg BID. His home atenolol 50 mg daily and simvastatin 40 mg daily  were continued. He spent 5 days in inpatient rehab and ultimately improved back to his baseline without residual deficits. Since discharge he is doing very well. Denies any recurrent weakness or slurred speech. He reports compliance with his medications.  -- Continue Eliquis 5 mg BID, will need life long anticoagulation in the setting of atrial flutter with embolic CVA  -- Continue Simvastatin 40 mg daily  -- Continue atenolol 50 mg daily  -- Continue home health PT -- F/u neurology scheduled

## 2017-02-26 NOTE — Assessment & Plan Note (Signed)
Refilled simvastatin 40 mg daily. 

## 2017-02-27 NOTE — Progress Notes (Signed)
Internal Medicine Clinic Attending  Case discussed with Dr. Guilloud at the time of the visit.  We reviewed the resident's history and exam and pertinent patient test results.  I agree with the assessment, diagnosis, and plan of care documented in the resident's note.  

## 2017-02-28 ENCOUNTER — Emergency Department (HOSPITAL_COMMUNITY)
Admission: EM | Admit: 2017-02-28 | Discharge: 2017-02-28 | Disposition: A | Discharge status: Home / Self Care | Payer: Medicare Other | Attending: Emergency Medicine | Admitting: Emergency Medicine

## 2017-02-28 ENCOUNTER — Emergency Department (HOSPITAL_COMMUNITY): Payer: Medicare Other

## 2017-02-28 ENCOUNTER — Encounter (HOSPITAL_COMMUNITY): Payer: Self-pay | Admitting: Emergency Medicine

## 2017-02-28 ENCOUNTER — Other Ambulatory Visit: Payer: Self-pay

## 2017-02-28 DIAGNOSIS — Z79899 Other long term (current) drug therapy: Secondary | ICD-10-CM | POA: Insufficient documentation

## 2017-02-28 DIAGNOSIS — F17210 Nicotine dependence, cigarettes, uncomplicated: Secondary | ICD-10-CM | POA: Insufficient documentation

## 2017-02-28 DIAGNOSIS — N3000 Acute cystitis without hematuria: Secondary | ICD-10-CM | POA: Insufficient documentation

## 2017-02-28 DIAGNOSIS — Z8673 Personal history of transient ischemic attack (TIA), and cerebral infarction without residual deficits: Secondary | ICD-10-CM | POA: Diagnosis not present

## 2017-02-28 DIAGNOSIS — I129 Hypertensive chronic kidney disease with stage 1 through stage 4 chronic kidney disease, or unspecified chronic kidney disease: Secondary | ICD-10-CM | POA: Insufficient documentation

## 2017-02-28 DIAGNOSIS — R531 Weakness: Secondary | ICD-10-CM | POA: Diagnosis not present

## 2017-02-28 DIAGNOSIS — Z8546 Personal history of malignant neoplasm of prostate: Secondary | ICD-10-CM | POA: Insufficient documentation

## 2017-02-28 DIAGNOSIS — N182 Chronic kidney disease, stage 2 (mild): Secondary | ICD-10-CM | POA: Diagnosis not present

## 2017-02-28 DIAGNOSIS — Z7901 Long term (current) use of anticoagulants: Secondary | ICD-10-CM | POA: Diagnosis not present

## 2017-02-28 LAB — BASIC METABOLIC PANEL
Anion gap: 9 (ref 5–15)
BUN: 26 mg/dL — AB (ref 6–20)
CHLORIDE: 99 mmol/L — AB (ref 101–111)
CO2: 23 mmol/L (ref 22–32)
Calcium: 8.7 mg/dL — ABNORMAL LOW (ref 8.9–10.3)
Creatinine, Ser: 1.26 mg/dL — ABNORMAL HIGH (ref 0.61–1.24)
GFR calc Af Amer: >60 mL/min (ref >60)
GFR, EST NON AFRICAN AMERICAN: 57 mL/min — AB (ref >60)
GLUCOSE: 145 mg/dL — AB (ref 65–99)
POTASSIUM: 3.7 mmol/L (ref 3.5–5.1)
Sodium: 131 mmol/L — ABNORMAL LOW (ref 135–145)

## 2017-02-28 LAB — URINALYSIS, ROUTINE W REFLEX MICROSCOPIC
BILIRUBIN URINE: NEGATIVE
Glucose, UA: NEGATIVE mg/dL
Ketones, ur: NEGATIVE mg/dL
Nitrite: NEGATIVE
Protein, ur: NEGATIVE mg/dL
SPECIFIC GRAVITY, URINE: 1.01 (ref 1.005–1.030)
pH: 5 (ref 5.0–8.0)

## 2017-02-28 LAB — CBC
HEMATOCRIT: 37.4 % — AB (ref 39.0–52.0)
Hemoglobin: 11.9 g/dL — ABNORMAL LOW (ref 13.0–17.0)
MCH: 27.8 pg (ref 26.0–34.0)
MCHC: 31.8 g/dL (ref 30.0–36.0)
MCV: 87.4 fL (ref 78.0–100.0)
Platelets: 169 10*3/uL (ref 150–400)
RBC: 4.28 MIL/uL (ref 4.22–5.81)
RDW: 15.4 % (ref 11.5–15.5)
WBC: 3.2 10*3/uL — ABNORMAL LOW (ref 4.0–10.5)

## 2017-02-28 LAB — CBG MONITORING, ED: GLUCOSE-CAPILLARY: 144 mg/dL — AB (ref 65–99)

## 2017-02-28 MED ORDER — CEPHALEXIN 500 MG PO CAPS: 500.0000 mg | ORAL_CAPSULE | Freq: Two times a day (BID) | ORAL | 0 refills | Status: AC

## 2017-02-28 MED ORDER — DEXTROSE 5 % IV SOLN
1.0000 g | Freq: Once | INTRAVENOUS | Status: AC
  Administered 2017-02-28: 1 g via INTRAVENOUS
  Filled 2017-02-28: qty 10

## 2017-02-28 NOTE — ED Notes (Signed)
Got patient into a gown on the monitor patient is resting with family at beside and call bell in reach

## 2017-02-28 NOTE — Discharge Instructions (Signed)
Take antibiotics as directed. Please take all of your antibiotics until finished.  Make sure you surgery plan fluids to stay hydrated.  Follow up with your primary care doctor in the next 24-48 hours for further evaluation.  Ollow-up with her cardiologist as directed.  Return the emergency Department for any numbness/weakness of her arms or legs, difficulty walking, chest pain, difficulty breathing, blood in his urine or any other worsening or concerning symptoms.

## 2017-02-28 NOTE — ED Provider Notes (Signed)
Medical screening examination/treatment/procedure(s) were conducted as a shared visit with non-physician practitioner(s) and myself.  I personally evaluated the patient during the encounter.   EKG Interpretation  Date/Time:  Saturday February 28 2017 10:46:43 EDT Ventricular Rate:  82 PR Interval:    QRS Duration: 92 QT Interval:  414 QTC Calculation: 483 R Axis:   72 Text Interpretation:  Atrial fibrillation versus NSR with PACs Left ventricular hypertrophy Marked ST abnormality, possible lateral subendocardial injury Prolonged QT Abnormal ECG Confirmed by Pryor Curia [phone]) on 02/28/2017 11:17:12 AM      Pt is a 66 y.o. male with history of atrial fibrillation on Eliquis who had a recent CVA with left-sided weakness in September who presents emergency department today with left leg weakness. Weakness worse than his chronic deficits. States symptoms lasted several minutes and then resolved. Having some chronic left arm and left weakness now. No new focal neurologic otherwise.  Labs show mild chronic kidney disease. He does appear to have a UTI which could be exacerbating his old symptoms. We'll give IV antibiotics and send urine culture. Head CT shows no acute abnormality. Suspect worsening of previous stroke symptoms because of UTI. I feel he is safe to be discharged. He has had a full stroke workup and has outpatient follow-up. Patient at his neurologic baseline. [name] comfortable with this plan.   Carah Barrientes, Delice Bison, DO 02/28/17 [id number]

## 2017-02-28 NOTE — ED Provider Notes (Signed)
[facility] EMERGENCY DEPARTMENT Provider Note   CSN: [id number] Arrival date & time: 02/28/17  2409     History   Chief Complaint Chief Complaint  Patient presents with  . Weakness    HPI Ian Hill is a 66 y.o. male with PMH/o CVA who presents with LLE weakness that began this morning. Per sister, patient went outside to reports to smoke a cigarette approximately 940. The patient returned to the house at 945. He positives that the door and when sister asked what was wrong, he said "I'm okay," but continued to stare and not opened a door. Sr. States that patient continued to talk to her but states that his words may have sounded slightly slurred. She denies any slurred speech now.Patient then states he was having some weakness in his left lower extremity. Sisters became concerned because patient has not had similar symptoms with previous stroke in September 2018 probably ED visit. He denies any loss of consciousness, head injury. Patient reports that now the leg weakness has resolved. Patient recently had a CVA in September 2018 and was admitted. Patient denies any chest pain, difficulty breathing, abdominal pain, nausea/vomiting, numbness in his arms or legs, facial drooping, slurred speech.  The history is provided by the patient.    Past Medical History:  Diagnosis Date  . BPH (benign prostatic hyperplasia)   . CKD (chronic kidney disease), stage II   . Diverticulosis   . Dyslipidemia   . Elevated PSA   . Hypertension   . Hypertrophic obstructive cardiomyopathy with diastolic heart failure ([place])   . Mitral regurgitation   . Urine retention     Patient Active Problem List   Diagnosis Date Noted  . History of prostate cancer 02/26/2017  . Cerebrovascular accident (CVA) due to embolic occlusion of right middle cerebral artery ([place]) 02/25/2017  . Enlarged prostate with urinary retention 11/24/2016  . Lung nodule 09/16/2016  . Volume overload  09/10/2016  . Urinary retention 09/10/2016  . BPH (benign prostatic hyperplasia) 09/01/2016  . Periumbilical abdominal pain 09/01/2016  . Cerumen impaction 02/16/2016  . Decreased dorsalis pedis pulse 02/16/2016  . CKD (chronic kidney disease), stage II 02/20/2012  . Preventative health care 06/26/2010  . Other symptoms involving cardiovascular system 11/19/2009  . Hypertrophic obstructive cardiomyopathy ([place]) 10/10/2009  . Hyperlipidemia 04/30/2006  . TOBACCO ABUSE 04/30/2006  . Essential hypertension 04/30/2006  . ELEVATED PROSTATE SPECIFIC ANTIGEN 04/30/2006  . DIVERTICULOSIS, COLON 12/18/2005    Past Surgical History:  Procedure Laterality Date  . 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 CARDIOVASCULAR STRESS TEST  11-19-2016  dr Stanford Breed   Low risk nuclear study/  normal resting and stress perfusion with no ischemia or infarction/  EF not done due to PVCIs , TID borderline, 1.2 significant LVH  . TRANSTHORACIC ECHOCARDIOGRAM  11-20-2016   dr Stanford Breed   mild LVH,  apical hypertrophic cardiomyopathy, systolic function vigorous,  ef 65-70%,  grade 2 diastolic dysfunction/  mild AR, MR, and  PR/  moderate LAE and RAE/  mild to mod. TR/  moderate increase PASP 21mmHg  . TRANSURETHRAL RESECTION OF PROSTATE N/A 11/24/2016   Procedure: TRANSURETHRAL RESECTION OF THE PROSTATE (TURP);  Surgeon: Franchot Gallo, MD;  [facility];  Service: Urology;  Laterality: N/A;       Home Medications    Prior to Admission medications   Medication Sig Start Date End Date Taking? Authorizing Provider  apixaban (ELIQUIS) 5  MG TABS tablet Take 1 tablet (5 mg total) by mouth 2 (two) times daily. 02/25/17  Yes Velna Ochs, MD  atenolol (TENORMIN) 50 MG tablet Take 1 tablet (50 mg total) by mouth every morning. 02/25/17  Yes Velna Ochs, MD  feeding supplement, ENSURE ENLIVE, (ENSURE ENLIVE) LIQD Take 237 mLs by mouth 2 (two) times daily between  meals. 02/25/17  Yes Velna Ochs, MD  finasteride (PROSCAR) 5 MG tablet Take 1 tablet (5 mg total) by mouth daily. 02/25/17  Yes Velna Ochs, MD  hydrochlorothiazide (HYDRODIURIL) 12.5 MG tablet Take 1 tablet (12.5 mg total) by mouth daily. TAKE 25mg  BY MOUTH EVERY DAY 02/25/17  Yes Velna Ochs, MD  polyethylene glycol (MIRALAX / GLYCOLAX) packet Take 17 g by mouth daily as needed for mild constipation. 02/25/17  Yes Velna Ochs, MD  senna-docusate (SENOKOT-S) 8.6-50 MG tablet Take 2 tablets by mouth daily. 02/25/17  Yes Velna Ochs, MD  simvastatin (ZOCOR) 40 MG tablet Take 1 tablet (40 mg total) by mouth at bedtime. 02/25/17  Yes Velna Ochs, MD  tamsulosin (FLOMAX) 0.4 MG CAPS capsule Take 1 capsule (0.4 mg total) by mouth daily. 02/25/17  Yes Velna Ochs, MD  cephALEXin (KEFLEX) 500 MG capsule Take 1 capsule (500 mg total) by mouth 2 (two) times daily. 02/28/17 03/07/17  Volanda Napoleon, PA-C    Family History No family history on file.  Social History Social History  Substance Use Topics  . Smoking status: Current Every Day Smoker    Packs/day: 0.30    Types: Cigarettes  . Smokeless tobacco: Never Used     Comment: two cigs a day  . Alcohol use No     Allergies   Patient has no known allergies.   Review of Systems Review of Systems  Constitutional: Negative for fever.  HENT: Negative for congestion.   Respiratory: Negative for shortness of breath.   Cardiovascular: Negative for chest pain.  Gastrointestinal: Negative for abdominal pain, diarrhea, nausea and vomiting.  Genitourinary: Negative for dysuria and hematuria.  Musculoskeletal: Negative for back pain and neck pain.  Neurological: Positive for speech difficulty and weakness (LLE). Negative for numbness and headaches.     Physical Exam Updated Vital Signs BP 133/79   Pulse 74   Temp 97.9 F (36.6 C)   Resp (!) 21   Ht 5\' 9"  (1.753 m)   Wt 54 kg (119 lb)   SpO2  100%   BMI 17.57 kg/m   Physical Exam  Constitutional: [name] oriented to person, place, and time. He appears well-developed and well-nourished.  Sitting comfortably on examination table  HENT:  Head: Normocephalic and atraumatic.  Mouth/Throat: Oropharynx is clear and moist and mucous membranes are normal.  Eyes: Pupils are equal, round, and reactive to light. Conjunctivae, EOM and lids are normal.  Neck: Full passive range of motion without pain.  Cardiovascular: Normal rate, regular rhythm, normal heart sounds and normal pulses.  Exam reveals no gallop and no friction rub.   No murmur heard. Pulmonary/Chest: Effort normal and breath sounds normal.  Abdominal: Soft. Normal appearance. There is no tenderness. There is no rigidity and no guarding.  Musculoskeletal: Normal range of motion.  Neurological: [name] alert and oriented to person, place, and time. GCS eye subscore is 4. GCS verbal subscore is 5. GCS motor subscore is 6.  Cranial nerves III-XII intact Follows commands, Moves all extremities  5/5 strength to RUE and RLE. Left side grip strength is slightly weaker. Very slight diminished strength  to LUE and LLE but patient reports that this is old deficit from stroke and is not new.  Sensation intact throughout all major nerve distributions Normal finger to nose. No dysdiadochokinesia. No pronator drift. No slurred speech. No facial droop.   Skin: Skin is warm and dry. Capillary refill takes less than 2 seconds.  Psychiatric: He has a normal mood and affect. His speech is normal.  Nursing note and vitals reviewed.    ED Treatments / Results  Labs (all labs ordered are listed, but only abnormal results are displayed) Labs Reviewed  URINE CULTURE - Abnormal; Notable for the following:       Result Value   Culture   (*)    Value: >=100,000 COLONIES/mL ENTEROCOCCUS FAECALIS SUSCEPTIBILITIES TO FOLLOW    All other components within normal limits  BASIC METABOLIC PANEL -  Abnormal; Notable for the following:    Sodium 131 (*)    Chloride 99 (*)    Glucose, Bld 145 (*)    BUN 26 (*)    Creatinine, Ser 1.26 (*)    Calcium 8.7 (*)    GFR calc non Af Amer 57 (*)    All other components within normal limits  CBC - Abnormal; Notable for the following:    WBC 3.2 (*)    Hemoglobin 11.9 (*)    HCT 37.4 (*)    All other components within normal limits  URINALYSIS, ROUTINE W REFLEX MICROSCOPIC - Abnormal; Notable for the following:    APPearance HAZY (*)    Hgb urine dipstick MODERATE (*)    Leukocytes, UA MODERATE (*)    Bacteria, UA MANY (*)    Squamous Epithelial / LPF 0-5 (*)    All other components within normal limits  CBG MONITORING, ED - Abnormal; Notable for the following:    Glucose-Capillary 144 (*)    All other components within normal limits    EKG  EKG Interpretation  Date/Time:  Saturday February 28 2017 12:16:19 EDT Ventricular Rate:  74 PR Interval:    QRS Duration: 94 QT Interval:  416 QTC Calculation: 462 R Axis:   83 Text Interpretation:  Wandering atrial pacemaker Probable left atrial enlargement Borderline right axis deviation LVH with secondary repolarization abnormality Anterior Q waves, possibly due to LVH since last tracing no significant change Confirmed by Noemi Chapel [phone]) on 03/01/2017 9:09:28 PM       Radiology No results found.  Procedures Procedures (including critical care time)  Medications Ordered in ED Medications  cefTRIAXone (ROCEPHIN) 1 g in dextrose 5 % 50 mL IVPB (0 g Intravenous Stopped 02/28/17 1635)     Initial Impression / Assessment and Plan / ED Course  I have reviewed the triage vital signs and the nursing notes.  Pertinent labs & imaging results that were available during my care of the patient were reviewed by me and considered in my medical decision making (see chart for details).     66 y.o. M with PMH/o CVA (September 2018) who presents with evaluation for left lower extremity  weakness that began approximately 9:45 AM this morning. Patient is afebrile, non-toxic appearing, sitting comfortably on examination table. Vital signs reviewed and stable. Patient appears to be going in and out of afib. Review of previous records show that he has a history of paroxsymal afib. On ED arrival, Patient reports that weakness has resolved and he is back to baseline. He does have residual left-sided deficits from a previous right-sided CVA that occurred on 01/23/17.  Neuro exam shows some decreased strength to both left upper and left lower extremity but patient reports that is his baseline after previous stroke. [name] able to ambulate without any difficulty. Will evaluate with CT for any acute abnormality. Will also check for basic labs for evaluation of other causes of weakness. Discussed with patient regarding A. Fib. He reports that he has been taking his eliquis as directed.  Labs and imaging reviewed. BMP shows by mouth and creatinine are slightly elevated. Records reviews is consistent with previous. CBC shows hemoglobin and hematocrit 11.9 and 37.4. UA is positive for UTI. CT head shows infarct in the right frontal parietal region. I reviewed previous records from patient's other charts. There is mention MRI of a old infarct in the frontal region but I don't see any indications of a parietal infarct. Will plan to consult radiologist.   Discussed with Dr. Jimmye Norman (radiologist). He will review patient's previous records and compare today's CT head with previous MRI and CT for evaluation of old versus acute infarct. We'll wait for his instruction and comparison before putting an MRI brain for further evaluation and consultation neurology.  Discussed with Dr. Jimmye Norman (radiologist). He compared to previous imaging from patient's other chart. He reviewed patient's MRI and compared with today's CT head. He reports that today's findings are consistent with previous MRI and there is no new infarct.  Given these findings, do not feel patient needs additional neuro consult. Discussed patient with Dr. Leonides Schanz. Agrees with plan. Will plan to give IV abx for treatment of UTI and will plan to have patient d/c home on outpatient abx. Discussed with patient and family agreeable to plan.   Re-evaluation. Patient still denying any symptoims. Repeat neuro exam stable. Patient has a follow-up appointment with his cardiologist next week. Encouraged him to folow-up with PCP also. Strict return precautions discussed. Patient expresses understanding and agreement to plan.    Final Clinical Imprssions(s) / ED Diagnoses   Final diagnoses:  Acute cystitis without hematuria  Weakness    New Prescriptions Discharge Medication List as of 02/28/2017  4:28 PM    START taking these medications   Details  cephALEXin (KEFLEX) 500 MG capsule Take 1 capsule (500 mg total) by mouth 2 (two) times daily., Starting Sat 02/28/2017, Until Sat 03/07/2017, Print         Volanda Napoleon, PA-C 03/02/17 1350

## 2017-03-03 LAB — URINE CULTURE: Culture: >=100000 — AB

## 2017-03-04 ENCOUNTER — Telehealth: Payer: Self-pay | Admitting: *Deleted

## 2017-03-04 NOTE — Progress Notes (Signed)
ED Antimicrobial Stewardship Positive Culture Follow Up   Ian Hill is an 66 y.o. male who presented to [facility] on 02/28/2017 with a chief complaint of  Chief Complaint  Patient presents with  . Weakness    Recent Results (from the past 720 hour(s))  Urine culture     Status: Abnormal   Collection Time: 02/28/17 12:00 PM  Result Value Ref Range Status   Specimen Description URINE, RANDOM  Final   Special Requests CX ADDED AT 0634 ON 280034  Final   Culture >=100,000 COLONIES/mL ENTEROCOCCUS FAECALIS (A)  Final   Report Status 03/03/2017 FINAL  Final   Organism ID, Bacteria ENTEROCOCCUS FAECALIS (A)  Final      Susceptibility   Enterococcus faecalis - MIC*    AMPICILLIN <=2 SENSITIVE Sensitive     LEVOFLOXACIN 1 SENSITIVE Sensitive     NITROFURANTOIN <=16 SENSITIVE Sensitive     VANCOMYCIN 1 SENSITIVE Sensitive     * >=100,000 COLONIES/mL ENTEROCOCCUS FAECALIS    Call patient and ask if he is feeling better. If any urinary symptoms would change treatment.  New antibiotic prescription: Amoxicillin 500mg  PO BID x 7 days  ED Provider: Wyn Quaker PA-C   Reginia Naas 03/04/2017, 9:35 AM Infectious Diseases Pharmacist Phone# [phone]

## 2017-03-04 NOTE — Telephone Encounter (Signed)
Post ED Visit - Positive Culture Follow-up: Successful Patient Follow-Up  Culture assessed and recommendations reviewed by: [x]  Elenor Quinones, Pharm.D. []  Heide Guile, Pharm.D., BCPS AQ-ID []  Parks Neptune, Pharm.D., BCPS []  Alycia Rossetti, Pharm.D., BCPS []  [place], [place].D., BCPS, AAHIVP []  Legrand Como, Pharm.D., BCPS, AAHIVP []  Salome Arnt, PharmD, BCPS []  Dimitri Ped, PharmD, BCPS []  Vincenza Hews, PharmD, BCPS  Positive urine culture  []  Patient discharged without antimicrobial prescription and treatment is now indicated [x]  Organism is resistant to prescribed ED discharge antimicrobial []  Patient with positive blood cultures  Changes discussed with ED provider Wyn Quaker, PA-C New antibiotic prescription Amoxicillin 500mg  PO BID x 7 days Called to [facility], [place] patient, date 03/04/2017  time 1115   Ardeen Fillers 03/04/2017, 11:26 AM

## 2017-03-06 NOTE — Progress Notes (Signed)
Error Brian Crenshaw   

## 2017-03-09 ENCOUNTER — Other Ambulatory Visit: Payer: Self-pay | Admitting: Internal Medicine

## 2017-03-11 ENCOUNTER — Ambulatory Visit (INDEPENDENT_AMBULATORY_CARE_PROVIDER_SITE_OTHER): Payer: Medicare Other | Admitting: Cardiology

## 2017-03-11 ENCOUNTER — Encounter: Payer: Self-pay | Admitting: Cardiology

## 2017-03-11 ENCOUNTER — Encounter: Payer: Medicare Other | Admitting: Internal Medicine

## 2017-03-11 VITALS — BP 130/74 | HR 70 | Ht 72.0 in | Wt 119.0 lb

## 2017-03-11 DIAGNOSIS — E7800 Pure hypercholesterolemia, unspecified: Secondary | ICD-10-CM

## 2017-03-11 DIAGNOSIS — I10 Essential (primary) hypertension: Secondary | ICD-10-CM

## 2017-03-11 DIAGNOSIS — I4819 Other persistent atrial fibrillation: Secondary | ICD-10-CM

## 2017-03-11 DIAGNOSIS — I422 Other hypertrophic cardiomyopathy: Secondary | ICD-10-CM

## 2017-03-11 DIAGNOSIS — I421 Obstructive hypertrophic cardiomyopathy: Secondary | ICD-10-CM

## 2017-03-11 DIAGNOSIS — I481 Persistent atrial fibrillation: Secondary | ICD-10-CM | POA: Diagnosis not present

## 2017-03-11 NOTE — Progress Notes (Signed)
HPI: FU hypertrophic cardiomyopathy. Abdominal ultrasound in August of 2011 showed no aneurysm. Carotid Dopplers showed 0-39% bilateral stenosis. Holter monitor showed nonsustained ventricular tachycardia (7 beats). Stress echocardiogram showed possible inferior hypokinesis but systolic blood pressure decreased with exercise. We discussed referral for ICD but the family ultimately declined understanding risk of sudden cardiac death. Nuclear study 12-17-2016 showed normal perfusion. Echocardiogram Dec 17, 2016 showed vigorous LV function, grade 2 diastolic dysfunction, apical pattern consistent with apical hypertrophic cardiomyopathy, mild aortic and mitral regurgitation and biatrial enlargement. Mild to moderate tricuspid regurgitation. Patient was admitted September 2018 and had a CVA. Patient was noted to have occluded right vertebral artery and underwent percutaneous intervention.  During that admission he was noted to have atrial fibrillation/flutter. He was placed on apixaban. Echocardiogram September 2018 showed apical variant hypertrophic cardiomyopathy, grade 2 diastolic dysfunction, mild aortic insufficiency, mild to moderate mitral regurgitation, mild biatrial enlargement and mild to moderate tricuspid regurgitation. Since last seen, patient denies dyspnea, chest pain, palpitations or syncope. No bleeding.  Current Outpatient Prescriptions  Medication Sig Dispense Refill  . amoxicillin (AMOXIL) 500 MG capsule Take 500 mg by mouth 2 (two) times daily.    Marland Kitchen apixaban (ELIQUIS) 5 MG TABS tablet Take 1 tablet (5 mg total) by mouth 2 (two) times daily. 60 tablet 2  . atenolol (TENORMIN) 50 MG tablet Take 1 tablet (50 mg total) by mouth every morning. 30 tablet 5  . feeding supplement, ENSURE ENLIVE, (ENSURE ENLIVE) LIQD Take 237 mLs by mouth 2 (two) times daily between meals. 237 mL 12  . finasteride (PROSCAR) 5 MG tablet Take 1 tablet (5 mg total) by mouth daily. 30 tablet 5  .  hydrochlorothiazide (HYDRODIURIL) 12.5 MG tablet Take 1 tablet (12.5 mg total) by mouth daily. TAKE 25mg  BY MOUTH EVERY DAY (Patient taking differently: Take 25 mg by mouth daily. TAKE 25mg  BY MOUTH EVERY DAY) 30 tablet 5  . polyethylene glycol (MIRALAX / GLYCOLAX) packet Take 17 g by mouth daily as needed for mild constipation. 14 each 0  . simvastatin (ZOCOR) 40 MG tablet Take 1 tablet (40 mg total) by mouth at bedtime. 90 tablet 5  . tamsulosin (FLOMAX) 0.4 MG CAPS capsule Take 1 capsule (0.4 mg total) by mouth daily. 30 capsule 5   No current facility-administered medications for this visit.      Past Medical History:  Diagnosis Date  . BPH (benign prostatic hyperplasia)   . CKD (chronic kidney disease), stage II   . Diverticulosis   . Dyslipidemia   . Elevated PSA   . Hypertension   . Hypertrophic obstructive cardiomyopathy with diastolic heart failure ([place])   . Mitral regurgitation   . Urine retention     Past Surgical History:  Procedure Laterality Date  . 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 CARDIOVASCULAR STRESS TEST  11-19-2016  dr Stanford Breed   Low risk nuclear study/  normal resting and stress perfusion with no ischemia or infarction/  EF not done due to PVCIs , TID borderline, 1.2 significant LVH  . TRANSTHORACIC ECHOCARDIOGRAM  11-20-2016   dr Stanford Breed   mild LVH,  apical hypertrophic cardiomyopathy, systolic function vigorous,  ef 65-70%,  grade 2 diastolic dysfunction/  mild AR, MR, and  PR/  moderate LAE and RAE/  mild to mod. TR/  moderate increase PASP 27mmHg  . TRANSURETHRAL RESECTION OF PROSTATE N/A 11/24/2016   Procedure: TRANSURETHRAL RESECTION OF THE PROSTATE (TURP);  Surgeon:  Franchot Gallo, MD;  Location: [facility];  Service: Urology;  Laterality: N/A;    Social History   Social History  . Marital status: Single    Spouse name: N/A  . Number of children: N/A  . Years of education: N/A   Occupational History  .  Not on file.   Social History Main Topics  . Smoking status: Current Every Day Smoker    Packs/day: 0.30    Types: Cigarettes  . Smokeless tobacco: Never Used     Comment: two cigs a day  . Alcohol use No  . Drug use: No  . Sexual activity: Not on file   Other Topics Concern  . Not on file   Social History Narrative  . No narrative on file    History reviewed. No pertinent family history.  ROS: no fevers or chills, productive cough, hemoptysis, dysphasia, odynophagia, melena, hematochezia, dysuria, hematuria, rash, seizure activity, orthopnea, PND, pedal edema, claudication. Remaining systems are negative.  Physical Exam: Well-developed thin in no acute distress.  Skin is warm and dry.  HEENT is normal.  Neck is supple.  Chest is clear to auscultation with normal expansion.  Cardiovascular exam is irregular Abdominal exam nontender or distended. No masses palpated. Extremities show no edema. neuro grossly intact  Electrocardiogram today shows atrial fibrillation, left ventricular hypertrophy with repolarization abnormality.  A/P  1 Hypertrophic obstructive cardiomyopathy-continue beta blocker. Patient does not ever wish to pursue ICD.   2 tobacco abuse-patient counseled on discontinuing.   3 hypertension-blood pressure controlled. Continue present medications.  4 hyperlipidemia-continue statin; management per primary care.  5 paroxysmal atrial fibrillation-patient had a recent CVA and was found to have atrial fibrillation/flutter. [name] in atrial fibrillation today. However not symptomatic. We'll plan rate control and anticoagulation. Continue apixaban. Check hemoglobin and renal function. Continue atenolol. He will need lifelong anticoagulation. Check TSH.  Kirk Ruths, MD

## 2017-03-11 NOTE — Patient Instructions (Signed)
Medication Instructions:   NO CHANGE  Labwork:  Your physician recommends that you HAVE LAB WORK TODAY  Follow-Up:  Your physician recommends that you schedule a follow-up appointment in: 3 MONTHS WITH DR CRENSHAW   If you need a refill on your cardiac medications before your next appointment, please call your pharmacy.    

## 2017-03-12 ENCOUNTER — Encounter: Payer: Self-pay | Admitting: *Deleted

## 2017-03-12 LAB — CBC
Hematocrit: 38.8 % (ref 37.5–51.0)
Hemoglobin: 12 g/dL — ABNORMAL LOW (ref 13.0–17.7)
MCH: 28.6 pg (ref 26.6–33.0)
MCHC: 30.9 g/dL — AB (ref 31.5–35.7)
MCV: 92 fL (ref 79–97)
PLATELETS: 151 10*3/uL (ref 150–379)
RBC: 4.2 x10E6/uL (ref 4.14–5.80)
RDW: 15 % (ref 12.3–15.4)
WBC: 3.6 10*3/uL (ref 3.4–10.8)

## 2017-03-12 LAB — BASIC METABOLIC PANEL
BUN / CREAT RATIO: 20 (ref 10–24)
BUN: 28 mg/dL — ABNORMAL HIGH (ref 8–27)
CO2: 27 mmol/L (ref 20–29)
CREATININE: 1.37 mg/dL — AB (ref 0.76–1.27)
Calcium: 9.5 mg/dL (ref 8.6–10.2)
Chloride: 101 mmol/L (ref 96–106)
GFR calc Af Amer: 61 mL/min/{1.73_m2} (ref >59)
GFR calc non Af Amer: 53 mL/min/{1.73_m2} — ABNORMAL LOW (ref >59)
GLUCOSE: 141 mg/dL — AB (ref 65–99)
Potassium: 4.5 mmol/L (ref 3.5–5.2)
SODIUM: 143 mmol/L (ref 134–144)

## 2017-03-12 LAB — TSH: TSH: 1.62 u[IU]/mL (ref 0.450–4.500)

## 2017-03-17 ENCOUNTER — Other Ambulatory Visit (HOSPITAL_COMMUNITY): Payer: Self-pay | Admitting: Interventional Radiology

## 2017-03-17 DIAGNOSIS — I639 Cerebral infarction, unspecified: Secondary | ICD-10-CM

## 2017-03-17 DIAGNOSIS — C61 Malignant neoplasm of prostate: Secondary | ICD-10-CM | POA: Diagnosis not present

## 2017-03-23 ENCOUNTER — Ambulatory Visit: Payer: Medicare Other

## 2017-03-24 NOTE — Addendum Note (Signed)
Addended by: Therisa Doyne on: 03/24/2017 04:08 PM   Modules accepted: Orders

## 2017-03-25 ENCOUNTER — Encounter: Payer: Self-pay | Admitting: Neurology

## 2017-03-25 ENCOUNTER — Ambulatory Visit (INDEPENDENT_AMBULATORY_CARE_PROVIDER_SITE_OTHER): Payer: Medicare Other | Admitting: Neurology

## 2017-03-25 VITALS — BP 120/79 | HR 78 | Ht 72.0 in | Wt 121.4 lb

## 2017-03-25 DIAGNOSIS — I10 Essential (primary) hypertension: Secondary | ICD-10-CM | POA: Diagnosis not present

## 2017-03-25 DIAGNOSIS — F17200 Nicotine dependence, unspecified, uncomplicated: Secondary | ICD-10-CM

## 2017-03-25 DIAGNOSIS — Z7901 Long term (current) use of anticoagulants: Secondary | ICD-10-CM | POA: Insufficient documentation

## 2017-03-25 DIAGNOSIS — I482 Chronic atrial fibrillation, unspecified: Secondary | ICD-10-CM | POA: Insufficient documentation

## 2017-03-25 DIAGNOSIS — I63511 Cerebral infarction due to unspecified occlusion or stenosis of right middle cerebral artery: Secondary | ICD-10-CM | POA: Diagnosis not present

## 2017-03-25 DIAGNOSIS — E785 Hyperlipidemia, unspecified: Secondary | ICD-10-CM

## 2017-03-25 NOTE — Patient Instructions (Addendum)
-   continue eliquis and simvastatin for stroke prevention  - follow up with cardiology - quit smoking - Follow up with your primary care physician for stroke risk factor modification. Recommend maintain blood pressure goal <130/80, diabetes with hemoglobin A1c goal below 7.0% and lipids with LDL cholesterol goal below 70 mg/dL.  - agree with increase daily activity - healthy diet and regular exercise - follow up in 4 months.

## 2017-03-25 NOTE — Progress Notes (Signed)
STROKE NEUROLOGY FOLLOW UP NOTE  NAME: Ian Hill DOB: [birth year]  REASON FOR VISIT: stroke follow up HISTORY FROM: sister and chart  Today we had the pleasure of seeing Ian Hill in follow-up at our Neurology Clinic. Pt was accompanied by sister.   History Summary Mr.Ian Andrewsis a 66 y.o.malewith history ofhypertension, smoker and recent prostate surgeryadmitted on 01/23/17 for left sidedweakness and confusion.Given tPA.  CTA head and neck showed right M2 occlusion, chronic right non-dominant VA occlusion, b/l siphon 50% stenosis. s/p mechanical thrombectomy with TICI3 reperfusion. MRI showed right MCA infarct, punctate right frontal and left occipital infarcts, and old right frontal infarct.  EF 55-60%.  LDL 68 and A1c 5.7.  Patient was found to have sustained A. fib, cardiology consulted, patient started on Eliquis, and continued on Zocor before discharge to CIR.  On discharge, he is to have mild left hemiparesis 4/5.  Interval History During the interval time, the patient has been doing well.  Left hemiparesis much improved, currently able to walk without any device, no significant hemiparetic gait.  Mild right hand dexterity difficulty.  Continue on Eliquis and Zocor without side effect.  However, still smoking.  BP 120/79 clinic.  Not checking at home.  Follow with cardiology, continue on atenolol for hypertrophic obstructive cardiomyopathy. TSH normal.  REVIEW OF SYSTEMS: Full 14 system review of systems performed and notable only for those listed below and in HPI above, all others are negative:  Constitutional: Weight loss Cardiovascular:  Ear/Nose/Throat:   Skin:  Eyes: Blurry vision Respiratory:   Gastroitestinal:   Genitourinary: Urination problems Hematology/Lymphatic:   Endocrine:  Musculoskeletal:   Allergy/Immunology:   Neurological:   Psychiatric:  Sleep:   The following represents the patient's updated allergies and side effects list: No  Known Allergies  The neurologically relevant items on the patient's problem list were reviewed on today's visit.  Neurologic Examination  A problem focused neurological exam (12 or more points of the single system neurologic examination, vital signs counts as 1 point, cranial nerves count for 8 points) was performed.  Blood pressure 120/79, pulse 78, height 6' (1.829 m), weight 121 lb 6.4 oz (55.1 kg).  General - Well nourished, well developed, in no apparent distress.  Ophthalmologic - Fundi not visualized due to noncooperation.  Cardiovascular - irregularly irregular heart rate and rhythm.  Mental Status -  Mild mental retardation, fidget and restless during exam. Level of arousal and orientation to year, age, and person were intact, but not orientated to place and month. Language including expression, naming, repetition, comprehension was assessed and found intact, however, with severe dysarthria and limited content on conversation. Fund of Knowledge was assessed and was impaired.  Cranial Nerves II - XII - II - Visual field intact OU. III, IV, VI - Extraocular movements intact. V - Facial sensation intact bilaterally. VII - Facial movement intact bilaterally. VIII - Hearing & vestibular intact bilaterally. X - Palate elevates symmetrically, severe dysarthria. XI - Chin turning & shoulder shrug intact bilaterally. XII - Tongue protrusion intact.  Motor Strength - The patient's strength was normal in all extremities and pronator drift was absent except mild left hand dexterity difficulty.  Bulk was normal and fasciculations were absent.   Motor Tone - Muscle tone was assessed at the neck and appendages and was normal.  Reflexes - The patient's reflexes were 1+ in all extremities and he had no pathological reflexes.  Sensory - Light touch, temperature/pinprick were assessed and were normal.    Coordination -  The patient had mild left finger to nose dysmetria.  Tremor was  absent.  Gait and Station - The patient's transfers, posture, gait, station, and turns were observed as normal.   Functional score  mRS = 1   0 - No symptoms.   1 - No significant disability. Able to carry out all usual activities, despite some symptoms.   2 - Slight disability. Able to look after own affairs without assistance, but unable to carry out all previous activities.   3 - Moderate disability. Requires some help, but able to walk unassisted.   4 - Moderately severe disability. Unable to attend to own bodily needs without assistance, and unable to walk unassisted.   5 - Severe disability. Requires constant nursing care and attention, bedridden, incontinent.   6 - Dead.   NIH Stroke Scale = 0   Data reviewed: I personally reviewed the images and agree with the radiology interpretations.  Ct Angio Head W Or Wo Contrast Ct Angio Neck W And/or Wo Contrast 01/23/2017 IMPRESSION:  Atherosclerotic disease at both carotid bifurcations. No stenosis on the left. 25% stenosis on the right. Advanced atherosclerotic disease throughout both carotid siphon regions. Stenosis estimated at 50% in both siphon regions. No flow demonstrated in the superior division right M2 branches, consistent with the hyperdense vessel seen at head CT. The patient has had old infarction in the right frontal lobe, but I would assume today's findings or acute. Proximal right vertebral artery stenosis, maximal at 50% 1 cm beyond the vessel origin. Nonvisualization of the distal right vertebral artery which appears to be thrombosed or occluded by embolic disease. The left vertebral artery is the dominant vessel and is widely patent to the basilar. The exact age of the right vertebral artery occlusion cannot be establish with certainty.   Ct Head Wo Contrast 01/23/2017 IMPRESSION:  1. Minimal contrast staining adjacent to the M1 segment of the right middle cerebral artery status post intervention.No  intraparenchymal hematoma or extra-axial collection.  2. Old right frontal lobe infarct.   Dg Chest Port 1 View 01/23/2017 IMPRESSION:  Tube positions as described without pneumothorax. Postoperative change on the left. Bibasilar atelectasis. No edema or consolidation. Heart mildly enlarged. There is aortic atherosclerosis. Aortic Atherosclerosis   Ct Head Code Stroke Wo Contrast 01/23/2017  IMPRESSION:  1. No acute hemorrhage or mass lesion.  2. Hyperdense appearance of the right middle cerebral artery M2/M3 segments may indicate acute thrombus, particularly in the setting of left-sided weakness.Correlation with CTA recommended.  3. Old right frontal infarct.  4. ASPECTS is 10.   Transthoracic Echocardiogram 01/24/2017 Left ventricle: The cavity size was normal. There was hypertrophy of the apex. The appearance was consistent with apical varianthypertrophic cardiomyopathy. Systolic function was normal. Theestimated ejection fraction was in the range of 55% to 60%. Therewas no dynamic obstruction. Wall motion was normal; there were noregional wall motion abnormalities  CEREBRAL ANGIOGRAM 01/23/2017 S/P RT common carotid arteriogram followed complete revascularization of occluded RT MCA inferior and sup divisions with x 1 pass with 35mm x 40 mm solitaire FR retrieval device achieving a TICI 3 reperfusion  Component     Latest Ref Rng & Units 01/24/2017 03/11/2017  Cholesterol     0 - 200 mg/dL 135   Triglycerides     <150 mg/dL 93   HDL Cholesterol     >40 mg/dL 48   Total CHOL/HDL Ratio     RATIO 2.8   VLDL     0 - 40 mg/dL  19   LDL (calc)     0 - 99 mg/dL 68   Hemoglobin A1C     4.8 - 5.6 % 5.7 (H)   Mean Plasma Glucose     mg/dL 116.89   TSH     0.450 - 4.500 uIU/mL  1.620    Assessment: As you may recall, he is a 66 y.o. African American male with PMH of hypertension, smoker and recent prostate surgeryadmitted on 01/23/17 for left sidedweakness and  confusion.Given tPA.  CTA head and neck showed right M2 occlusion, chronic right non-dominant VA occlusion, b/l siphon 50% stenosis. s/p mechanical thrombectomy with TICI3 reperfusion. MRI showed right MCA infarct, punctate right frontal and left occipital infarcts, and old right frontal infarct.  EF 55-60%.  LDL 68 and A1c 5.7.  Patient was found to have sustained A. fib, cardiology consulted, patient started on Eliquis and beta blocker, and continued on Zocor before discharge to CIR. During the interval time, left hemiparesis much improved, now able to walk without any device, no significant hemiparetic gait.  Mild right hand dexterity difficulty.  Continue on Eliquis and Zocor without side effect.  However, still smoking.  Follow with cardiology, continue on atenolol for hypertrophic obstructive cardiomyopathy. TSH normal.  Plan:  - continue eliquis and simvastatin for stroke prevention  - follow up with cardiology - quit smoking - Follow up with your primary care physician for stroke risk factor modification. Recommend maintain blood pressure goal <130/80, diabetes with hemoglobin A1c goal below 7.0% and lipids with LDL cholesterol goal below 70 mg/dL.  - agree with increase daily activity - healthy diet and regular exercise - follow up in 4 months.   I spent more than 25 minutes of face to face time with the patient. Greater than 50% of time was spent in counseling and coordination of care. We discussed continue eliquis, follow up with cardiology, and quit smoking.    No orders of the defined types were placed in this encounter.   No orders of the defined types were placed in this encounter.   Patient Instructions  - continue eliquis and simvastatin for stroke prevention  - follow up with cardiology - quit smoking - Follow up with your primary care physician for stroke risk factor modification. Recommend maintain blood pressure goal <130/80, diabetes with hemoglobin A1c goal below 7.0%  and lipids with LDL cholesterol goal below 70 mg/dL.  - agree with increase daily activity - healthy diet and regular exercise - follow up in 4 months.    Rosalin Hawking, MD PhD [facility] [address] [place], [place] [address] [phone]

## 2017-04-13 ENCOUNTER — Ambulatory Visit (HOSPITAL_COMMUNITY): Admission: RE | Admit: 2017-04-13 | Payer: Medicare Other | Source: Ambulatory Visit

## 2017-04-15 NOTE — Addendum Note (Signed)
Addended by: Vennie Homans on: 04/15/2017 03:28 PM   Modules accepted: Orders

## 2017-04-29 ENCOUNTER — Encounter: Payer: Self-pay | Admitting: Podiatry

## 2017-04-29 ENCOUNTER — Ambulatory Visit (INDEPENDENT_AMBULATORY_CARE_PROVIDER_SITE_OTHER): Payer: Medicare Other | Admitting: Podiatry

## 2017-04-29 DIAGNOSIS — B351 Tinea unguium: Secondary | ICD-10-CM | POA: Diagnosis not present

## 2017-04-29 DIAGNOSIS — M79674 Pain in right toe(s): Secondary | ICD-10-CM | POA: Diagnosis not present

## 2017-04-29 DIAGNOSIS — M79675 Pain in left toe(s): Secondary | ICD-10-CM | POA: Diagnosis not present

## 2017-04-29 NOTE — Progress Notes (Signed)
Complaint:  Visit Type: Patient returns to my office for continued preventative foot care services. Complaint: Patient states" my nails have grown long and thick and become painful to walk and wear shoes" . The patient presents for preventative foot care services. Patient has history of CVA.  Podiatric Exam: Vascular: dorsalis pedis and posterior tibial pulses are palpable bilateral. Capillary return is immediate. Temperature gradient is WNL. Skin turgor WNL  Sensorium: Normal Semmes Weinstein monofilament test. Normal tactile sensation bilaterally. Nail Exam: Pt has thick disfigured discolored nails with subungual debris noted bilateral entire nail hallux through fifth toenails Ulcer Exam: There is no evidence of ulcer or pre-ulcerative changes or infection. Orthopedic Exam: Muscle tone and strength are WNL. No limitations in general ROM. No crepitus or effusions noted. Foot type and digits show no abnormalities. Bony prominences are unremarkable. Skin: No Porokeratosis. No infection or ulcers  Diagnosis:  Onychomycosis, , Pain in right toe, pain in left toes  Treatment & Plan Procedures and Treatment: Consent by patient was obtained for treatment procedures. The patient understood the discussion of treatment and procedures well. All questions were answered thoroughly reviewed. Debridement of mycotic and hypertrophic toenails, 1 through 5 bilateral and clearing of subungual debris. No ulceration, no infection noted.  Return Visit-Office Procedure: Patient instructed to return to the office for a follow up visit 3 months for continued evaluation and treatment.    Gardiner Barefoot DPM

## 2017-04-30 ENCOUNTER — Other Ambulatory Visit: Payer: Self-pay | Admitting: *Deleted

## 2017-05-07 ENCOUNTER — Other Ambulatory Visit: Payer: Self-pay

## 2017-05-07 NOTE — Patient Outreach (Signed)
Telephone outreach to patient to obtain mRS was successfully completed. mRS = 2 

## 2017-05-08 MED ORDER — HYDROCHLOROTHIAZIDE 12.5 MG PO TABS: 12.5000 mg | ORAL_TABLET | Freq: Every day | ORAL | 2 refills | Status: DC

## 2017-05-26 DIAGNOSIS — H2513 Age-related nuclear cataract, bilateral: Secondary | ICD-10-CM | POA: Diagnosis not present

## 2017-05-26 DIAGNOSIS — H53483 Generalized contraction of visual field, bilateral: Secondary | ICD-10-CM | POA: Diagnosis not present

## 2017-05-27 DIAGNOSIS — C61 Malignant neoplasm of prostate: Secondary | ICD-10-CM | POA: Diagnosis not present

## 2017-05-27 DIAGNOSIS — N401 Enlarged prostate with lower urinary tract symptoms: Secondary | ICD-10-CM | POA: Diagnosis not present

## 2017-05-27 DIAGNOSIS — R3914 Feeling of incomplete bladder emptying: Secondary | ICD-10-CM | POA: Diagnosis not present

## 2017-06-05 NOTE — Progress Notes (Deleted)
HPI: FU hypertrophic cardiomyopathy. Abdominal ultrasound in August of 2011 showed no aneurysm. Carotid Dopplers showed 0-39% bilateral stenosis. Holter monitor showed nonsustained ventricular tachycardia (7 beats). Stress echocardiogram showed possible inferior hypokinesis but systolic blood pressure decreased with exercise. We discussed referral for ICD but the family ultimately declined understanding risk of sudden cardiac death. Nuclear study 01-06-17 showed normal perfusion. Echocardiogram 01/06/2017 showed vigorous LV function, grade 2 diastolic dysfunction, apical pattern consistent with apical hypertrophic cardiomyopathy, mild aortic and mitral regurgitation and biatrial enlargement. Mild to moderate tricuspid regurgitation. Patient was admitted September 2018 and had a CVA. Patient was noted to have occluded right vertebral artery and underwent percutaneous intervention.  During that admission he was noted to have atrial fibrillation/flutter. He was placed on apixaban. Echocardiogram September 2018 showed apical variant hypertrophic cardiomyopathy, grade 2 diastolic dysfunction, mild aortic insufficiency, mild to moderate mitral regurgitation, mild biatrial enlargement and mild to moderate tricuspid regurgitation. Since last seen,   Current Outpatient Medications  Medication Sig Dispense Refill  . apixaban (ELIQUIS) 5 MG TABS tablet Take 1 tablet (5 mg total) by mouth 2 (two) times daily. 60 tablet 0  . atenolol (TENORMIN) 50 MG tablet Take 1 tablet (50 mg total) by mouth daily. 30 tablet 0  . finasteride (PROSCAR) 5 MG tablet Take 1 tablet (5 mg total) by mouth daily. 30 tablet 0  . hydrochlorothiazide (HYDRODIURIL) 12.5 MG tablet Take 1 tablet (12.5 mg total) by mouth daily. 90 tablet 2  . simvastatin (ZOCOR) 40 MG tablet TAKE 1 TABLET (40 MG TOTAL) BY MOUTH AT BEDTIME. 90 tablet 2  . tamsulosin (FLOMAX) 0.4 MG CAPS capsule Take 1 capsule (0.4 mg total) by mouth daily. 30 capsule 0    No current facility-administered medications for this visit.      Past Medical History:  Diagnosis Date  . Abnormal ECG   . Hypertensive heart disease   . Stroke [facility])     Past Surgical History:  Procedure Laterality Date  . IR PERCUTANEOUS ART THROMBECTOMY/INFUSION INTRACRANIAL INC DIAG ANGIO  01/23/2017  . RADIOLOGY WITH ANESTHESIA N/A 01/23/2017   Procedure: RADIOLOGY WITH ANESTHESIA;  Surgeon: Luanne Bras, MD;  Location: [place];  Service: Radiology;  Laterality: N/A;    Social History   Socioeconomic History  . Marital status: Single    Spouse name: Not on file  . Number of children: Not on file  . Years of education: Not on file  . Highest education level: Not on file  Social Needs  . Financial resource strain: Not on file  . Food insecurity - worry: Not on file  . Food insecurity - inability: Not on file  . Transportation needs - medical: Not on file  . Transportation needs - non-medical: Not on file  Occupational History  . Not on file  Tobacco Use  . Smoking status: Current Some Day Smoker    Packs/day: 1.00  . Smokeless tobacco: Never Used  Substance and Sexual Activity  . Alcohol use: No    Frequency: Never  . Drug use: No  . Sexual activity: Not on file  Other Topics Concern  . Not on file  Social History Narrative  . Not on file    No family history on file.  ROS: no fevers or chills, productive cough, hemoptysis, dysphasia, odynophagia, melena, hematochezia, dysuria, hematuria, rash, seizure activity, orthopnea, PND, pedal edema, claudication. Remaining systems are negative.  Physical Exam: Well-developed well-nourished in no acute distress.  Skin is  warm and dry.  HEENT is normal.  Neck is supple.  Chest is clear to auscultation with normal expansion.  Cardiovascular exam is regular rate and rhythm.  Abdominal exam nontender or distended. No masses palpated. Extremities show no edema. neuro grossly intact  ECG- personally  reviewed  A/P  1  Kirk Ruths, MD

## 2017-06-15 ENCOUNTER — Ambulatory Visit: Payer: Medicare Other | Admitting: Cardiology

## 2017-06-24 ENCOUNTER — Ambulatory Visit: Payer: Medicare Other | Admitting: Adult Health

## 2017-06-29 ENCOUNTER — Encounter: Payer: Self-pay | Admitting: Physician Assistant

## 2017-06-29 ENCOUNTER — Other Ambulatory Visit: Payer: Self-pay | Admitting: Internal Medicine

## 2017-06-29 ENCOUNTER — Ambulatory Visit (INDEPENDENT_AMBULATORY_CARE_PROVIDER_SITE_OTHER): Payer: Medicare Other | Admitting: Physician Assistant

## 2017-06-29 VITALS — BP 122/66 | HR 74 | Wt 121.6 lb

## 2017-06-29 DIAGNOSIS — E785 Hyperlipidemia, unspecified: Secondary | ICD-10-CM

## 2017-06-29 DIAGNOSIS — I517 Cardiomegaly: Secondary | ICD-10-CM

## 2017-06-29 DIAGNOSIS — Z7901 Long term (current) use of anticoagulants: Secondary | ICD-10-CM | POA: Diagnosis not present

## 2017-06-29 DIAGNOSIS — I481 Persistent atrial fibrillation: Secondary | ICD-10-CM | POA: Diagnosis not present

## 2017-06-29 DIAGNOSIS — I4819 Other persistent atrial fibrillation: Secondary | ICD-10-CM

## 2017-06-29 DIAGNOSIS — I10 Essential (primary) hypertension: Secondary | ICD-10-CM | POA: Diagnosis not present

## 2017-06-29 NOTE — Progress Notes (Signed)
Cardiology Office Note   Date:  06/29/2017   ID:  Ian Hill, DOB [birth year], MRN [id number]  PCP:  [name], MD  Cardiologist: Dr. Stanford Breed, 03/11/2017 Rosaria Ferries, PA-C   Chief Complaint  Patient presents with  . Follow-up    History of Present Illness: Ian Hill is a 67 y.o. male with a history of nl MV 11/2016, mild carotid dz 2011, HCM w/ NSVT on Holter but pt/family declined ICD, CVA 01/2017, PAF>>Eliquis, 01/2017 echo w/ HCM, grade 2 dd, mild AI, mild-mod MR/TR, CKD II, BPH, HLD, HTN, diverticulosis, tob use  03/11/2017 office visit, patient was in atrial fib with good rate control, LVH on ECG  Ian Hill presents for cardiology follow up. His sister is with him.   Does not get light-headed or dizzy. Drinks some water daily, sister is not sure he drinks enough. Supposed to drink 3 bottles of water daily.   His ambulation is slower now, since the CVA. He gets some SOB going up the stairs. He might be getting a little cold. His sister wonders what he can take.   Does not wake w/ LE edema. Denies orthopnea or PND.  No history of chest pain.  No palpitations with no presyncope or syncope.  He will start with [facility] in 2 weeks. He will be there during the days.  This will help a great deal with general medical management.   Past Medical History:  Diagnosis Date  . Abnormal ECG   . HTN (hypertension)   . Hypertensive heart disease   . Hypertrophic cardiomegaly, apical variant   . Stroke ([place]) 01/2017   x 2    Past Surgical History:  Procedure Laterality Date  . IR PERCUTANEOUS ART THROMBECTOMY/INFUSION INTRACRANIAL INC DIAG ANGIO  01/23/2017  . RADIOLOGY WITH ANESTHESIA N/A 01/23/2017   Procedure: RADIOLOGY WITH ANESTHESIA;  Surgeon: Luanne Bras, MD;  Location: [facility];  Service: Radiology;  Laterality: N/A;    Current Outpatient Medications  Medication Sig Dispense Refill  . apixaban (ELIQUIS) 5 MG TABS tablet Take 1 tablet (5 mg  total) by mouth 2 (two) times daily. 60 tablet 0  . atenolol (TENORMIN) 50 MG tablet Take 1 tablet (50 mg total) by mouth daily. 30 tablet 0  . finasteride (PROSCAR) 5 MG tablet Take 1 tablet (5 mg total) by mouth daily. 30 tablet 0  . hydrochlorothiazide (HYDRODIURIL) 12.5 MG tablet Take 1 tablet (12.5 mg total) by mouth daily. 90 tablet 2  . simvastatin (ZOCOR) 40 MG tablet TAKE 1 TABLET (40 MG TOTAL) BY MOUTH AT BEDTIME. 90 tablet 2  . tamsulosin (FLOMAX) 0.4 MG CAPS capsule Take 1 capsule (0.4 mg total) by mouth daily. 30 capsule 0   No current facility-administered medications for this visit.     Allergies:   Patient has no known allergies.    Social History:  The patient  reports that he has been smoking.  He has been smoking about 1.00 pack per day. he has never used smokeless tobacco. He reports that he does not drink alcohol or use drugs.   Family History:  The patient's family history is not on file.    ROS:  Please see the history of present illness. All other systems are reviewed and negative.    PHYSICAL EXAM: VS:  BP 122/66   Pulse 74   Wt 121 lb 9.6 oz (55.2 kg)   SpO2 99%   BMI 16.49 kg/m  , BMI Body mass index is 16.49 kg/m. GEN:  Well nourished, well developed, male in no acute distress  HEENT: normal for age  Neck: minimal JVD, no carotid bruit, no masses Cardiac: RRR; 3/6 murmur, no rubs, or gallops Respiratory: decreased BS bases bilaterally, normal work of breathing GI: soft, nontender, nondistended, + BS MS: no deformity or atrophy; no edema; distal pulses are 2+ in all 4 extremities   Skin: warm and dry, no rash Neuro:  Strength and sensation are intact Psych: euthymic mood, full affect   EKG:  EKG is ordered today. The ekg ordered today demonstrates SR, HR 83, PACs and PVCs. Previous ECG was Afib, morphology otherwise the same.   ECHO: 01/24/2017 - Left ventricle: The cavity size was normal. There was hypertrophy   of the apex. The appearance was  consistent with apical variant   hypertrophic cardiomyopathy. Systolic function was normal. The   estimated ejection fraction was in the range of 55% to 60%. There   was no dynamic obstruction. Wall motion was normal; there were no   regional wall motion abnormalities. Features are consistent with   a pseudonormal left ventricular filling pattern, with concomitant   abnormal relaxation and increased filling pressure (grade 2   diastolic dysfunction). - Aortic valve: There was mild regurgitation directed centrally in   the LVOT. - Mitral valve: There was mild to moderate regurgitation directed   eccentrically and posteriorly. - Left atrium: The atrium was mildly dilated. - Right atrium: The atrium was mildly dilated. - Tricuspid valve: There was mild-moderate regurgitation directed   centrally. - Pulmonary arteries: Systolic pressure was mildly increased. PA   peak pressure: 36 mm Hg (S).  Recent Labs: 01/25/2017: Magnesium 2.0 01/29/2017: ALT 27 03/11/2017: BUN 28; Creatinine, Ser 1.37; Hemoglobin 12.0; Platelets 151; Potassium 4.5; Sodium 143; TSH 1.620    Lipid Panel    Component Value Date/Time   CHOL 135 01/24/2017 0604   TRIG 93 01/24/2017 0604   HDL 48 01/24/2017 0604   CHOLHDL 2.8 01/24/2017 0604   VLDL 19 01/24/2017 0604   LDLCALC 68 01/24/2017 0604     Wt Readings from Last 3 Encounters:  06/29/17 121 lb 9.6 oz (55.2 kg)  03/25/17 121 lb 6.4 oz (55.1 kg)  03/11/17 119 lb (54 kg)     Other studies Reviewed: Additional studies/ records that were reviewed today include: office notes, hospital records and testing.  ASSESSMENT AND PLAN:  1.  HCM: No sx attributable to this. [name] stable from a volume standpoint.  Continue beta-blocker low-dose diuretic.  If he gets lightheaded or dizzy, he is told to drink more water.  He still does not wish to have a defibrillator.  2.  PAF: He has no awareness of the atrial fibrillation and his heart rate is a little irregular at  baseline, so he may not be aware.  Beta-blocker and anticoagulation.  3.  Chronic anticoagulation: CHA2DS2VASc=4 (age x 1, CVA x 2, HTN), continue Eliquis  4. Hyperlipidemia: Continue statin, recheck labs prior to his office visit with Dr. Stanford Breed in October   Current medicines are reviewed at length with the patient today.  The patient does not have concerns regarding medicines.  The following changes have been made:  no change  Labs/ tests ordered today include:   Orders Placed This Encounter  Procedures  . Comprehensive metabolic panel  . Lipid panel  . EKG 12-Lead     Disposition:   FU with Dr. Stanford Breed  Signed, Rosaria Ferries, PA-C  06/29/2017 1:34 PM    [facility] Phone: [phone]; Fax: [phone]  This note was written with the assistance of speech recognition software. Please excuse any transcriptional errors.

## 2017-06-29 NOTE — Patient Instructions (Addendum)
Medication Instructions: Your physician recommends that you continue on your current medications as directed. Please refer to the Current Medication list given to you today.  If you need a refill on your cardiac medications before your next appointment, please call your pharmacy.   Labwork: Your provider would like for you to return in October to have the following labs drawn: FASTING lipid and CMET. Please have this done a week before your follow up with Dr. Stanford Breed. You do not need an appointment for the lab. Once in our office lobby there is a podium where you can sign in and ring the doorbell to alert [place] that you are here. The lab is open from 8:00 am to 4:30 pm; closed for lunch from 12:45pm-1:45pm.   Follow-Up: Your physician wants you to follow-up in: October with Dr. Stanford Breed. You will receive a reminder letter in the mail two months in advance. If you don't receive a letter, please call our office at [phone] to schedule this follow-up appointment.  Special Instructions:  You may take the following- Robitussin DM Plain Mucinex Benadryl  NO PRODUCTS CONTAINING SUDAFED AND PHENYLEPHRINE  Thank you for choosing [facility] at [facility]!!

## 2017-07-08 ENCOUNTER — Encounter: Payer: Medicare Other | Admitting: Internal Medicine

## 2017-07-09 ENCOUNTER — Telehealth: Payer: Self-pay | Admitting: Physician Assistant

## 2017-07-09 NOTE — Telephone Encounter (Signed)
New Message    Pt c/o medication issue:  1. Name of Medication: Eliquis 2. How are you currently taking this medication (dosage and times per day)?   3. Are you having a reaction (difficulty breathing--STAT)? No   4. What is your medication issue? NP Jolly Mango is calling to confirm the dosage that the patient should be taken of the Eliquis. She advises that a detailed message can be left on the secured line.

## 2017-07-09 NOTE — Telephone Encounter (Signed)
Spoke with Arnoldo Hooker NP and patient should be taking the Eliquis 5 mg BID. Per Morey Hummingbird sister has been giving just once daily, stated she would advise sister.

## 2017-07-22 NOTE — Progress Notes (Deleted)
[facility]  PATIENT: Ian Hill DOB: May 29, 1950   REASON FOR VISIT: *** HISTORY FROM:    HISTORY OF PRESENT ILLNESS: Mr.Ian Andrewsis a 67 y.o.malewith history ofhypertension, smoker and recent prostate surgeryadmitted on 01/23/17 for left sidedweakness and confusion.GiventPA. CTA head and neck showed right M2 occlusion, chronic right non-dominant VA occlusion, b/l siphon 50% stenosis. s/p mechanical thrombectomy with TICI3 reperfusion. MRI showed right MCA infarct, punctate right frontal and left occipital infarcts, and old right frontal infarct.  EF 55-60%.  LDL 68 and A1c 5.7.  Patient was found to have sustained A. fib, cardiology consulted, patient started on Eliquis, and continued on Zocor before discharge to [facility].  On discharge, he is to have mild left hemiparesis 4/5.  Interval History11/14/18DrErlinda Hong During the interval time, the patient has been doing well.  Left hemiparesis much improved, currently able to walk without any device, no significant hemiparetic gait.  Mild right hand dexterity difficulty.  Continue on Eliquis and Zocor without side effect.  However, still smoking.  BP 120/79 clinic.  Not checking at home.  Follow with cardiology, continue on atenolol for hypertrophic obstructive cardiomyopathy. TSH normal.   REVIEW OF SYSTEMS: Full 14 system review of systems performed and notable only for those listed, all others are neg:  Constitutional: neg  Cardiovascular: neg Ear/Nose/Throat: neg  Skin: neg Eyes: neg Respiratory: neg Gastroitestinal: neg  Hematology/Lymphatic: neg  Endocrine: neg Musculoskeletal:neg Allergy/Immunology: neg Neurological: neg Psychiatric: neg Sleep : neg   ALLERGIES: No Known Allergies  HOME MEDICATIONS: Outpatient Medications Prior to Visit  Medication Sig Dispense Refill  . apixaban (ELIQUIS) 5 MG TABS tablet Take 1 tablet (5 mg total) by mouth 2 (two) times daily. 60 tablet 0  . atenolol  (TENORMIN) 50 MG tablet TAKE 1 TABLET (50 MG TOTAL) BY MOUTH EVERY MORNING. 30 tablet 4  . finasteride (PROSCAR) 5 MG tablet Take 1 tablet (5 mg total) by mouth daily. 30 tablet 0  . hydrochlorothiazide (HYDRODIURIL) 12.5 MG tablet Take 1 tablet (12.5 mg total) by mouth daily. 90 tablet 2  . simvastatin (ZOCOR) 40 MG tablet TAKE 1 TABLET (40 MG TOTAL) BY MOUTH AT BEDTIME. 90 tablet 2  . tamsulosin (FLOMAX) 0.4 MG CAPS capsule Take 1 capsule (0.4 mg total) by mouth daily. 30 capsule 0   No facility-administered medications prior to visit.     PAST MEDICAL HISTORY: Past Medical History:  Diagnosis Date  . Abnormal ECG   . HTN (hypertension)   . Hypertensive heart disease   . Hypertrophic cardiomegaly, apical variant   . Stroke ([place]) 01/2017   x 2    PAST SURGICAL HISTORY: Past Surgical History:  Procedure Laterality Date  . IR PERCUTANEOUS ART THROMBECTOMY/INFUSION INTRACRANIAL INC DIAG ANGIO  01/23/2017  . RADIOLOGY WITH ANESTHESIA N/A 01/23/2017   Procedure: RADIOLOGY WITH ANESTHESIA;  Surgeon: Luanne Bras, MD;  Location: [facility];  Service: Radiology;  Laterality: N/A;    FAMILY HISTORY: No family history on file.  SOCIAL HISTORY: Social History   Socioeconomic History  . Marital status: Single    Spouse name: Not on file  . Number of children: Not on file  . Years of education: Not on file  . Highest education level: Not on file  Social Needs  . Financial resource strain: Not on file  . Food insecurity - worry: Not on file  . Food insecurity - inability: Not on file  . Transportation needs - medical: Not on file  . Transportation needs - non-medical:  Not on file  Occupational History  . Not on file  Tobacco Use  . Smoking status: Current Some Day Smoker    Packs/day: 1.00  . Smokeless tobacco: Never Used  Substance and Sexual Activity  . Alcohol use: No    Frequency: Never  . Drug use: No  . Sexual activity: Not on file  Other Topics Concern  . Not on  file  Social History Narrative  . Not on file     PHYSICAL EXAM  There were no vitals filed for this visit. There is no height or weight on file to calculate BMI.  Generalized: Well developed, in no acute distress  Head: normocephalic and atraumatic,. Oropharynx benign  Neck: Supple, no carotid bruits  Cardiac: Regular rate rhythm, no murmur  Musculoskeletal: No deformity   Neurological examination   Mentation: Alert oriented to time, place, history taking. Attention span and concentration appropriate. Recent and remote memory intact.  Follows all commands speech and language fluent.   Cranial nerve II-XII: Fundoscopic exam reveals sharp disc margins.Pupils were equal round reactive to light extraocular movements were full, visual field were full on confrontational test. Facial sensation and strength were normal. hearing was intact to finger rubbing bilaterally. Uvula tongue midline. head turning and shoulder shrug were normal and symmetric.Tongue protrusion into cheek strength was normal. Motor: normal bulk and tone, full strength in the BUE, BLE, fine finger movements normal, no pronator drift. No focal weakness Sensory: normal and symmetric to light touch, pinprick, and  Vibration, proprioception  Coordination: finger-nose-finger, heel-to-shin bilaterally, no dysmetria Reflexes: Brachioradialis 2/2, biceps 2/2, triceps 2/2, patellar 2/2, Achilles 2/2, plantar responses were flexor bilaterally. Gait and Station: Rising up from seated position without assistance, normal stance,  moderate stride, good arm swing, smooth turning, able to perform tiptoe, and heel walking without difficulty. Tandem gait is steady  DIAGNOSTIC DATA (LABS, IMAGING, TESTING) - I reviewed patient records, labs, notes, testing and imaging myself where available.  Lab Results  Component Value Date   WBC 3.6 03/11/2017   HGB 12.0 (L) 03/11/2017   HCT 38.8 03/11/2017   MCV 92 03/11/2017   PLT 151 03/11/2017        Component Value Date/Time   NA 143 03/11/2017 1500   K 4.5 03/11/2017 1500   CL 101 03/11/2017 1500   CO2 27 03/11/2017 1500   GLUCOSE 141 (H) 03/11/2017 1500   GLUCOSE 92 01/29/2017 0644   BUN 28 (H) 03/11/2017 1500   CREATININE 1.37 (H) 03/11/2017 1500   CALCIUM 9.5 03/11/2017 1500   PROT 6.9 01/29/2017 0644   ALBUMIN 3.5 01/29/2017 0644   AST 43 (H) 01/29/2017 0644   ALT 27 01/29/2017 0644   ALKPHOS 95 01/29/2017 0644   BILITOT 0.8 01/29/2017 0644   GFRNONAA 53 (L) 03/11/2017 1500   GFRAA 61 03/11/2017 1500   Lab Results  Component Value Date   CHOL 135 01/24/2017   HDL 48 01/24/2017   LDLCALC 68 01/24/2017   TRIG 93 01/24/2017   CHOLHDL 2.8 01/24/2017   Lab Results  Component Value Date   HGBA1C 5.7 (H) 01/24/2017   No results found for: [id number] Lab Results  Component Value Date   TSH 1.620 03/11/2017    ***  ASSESSMENT AND PLAN  67 y.o. year old male  has a past medical history of Abnormal ECG, HTN (hypertension), Hypertensive heart disease, Hypertrophic cardiomegaly, apical variant, and Stroke ([place]) (01/2017). here with ***  67 y.o. African American male with PMH of hypertension,  smoker and recent prostate surgeryadmitted on 01/23/17 for left sidedweakness and confusion.GiventPA. CTA head and neck showed right M2 occlusion, chronic right non-dominant VA occlusion, b/l siphon 50% stenosis. s/p mechanical thrombectomy with TICI3 reperfusion. MRI showed right MCA infarct, punctate right frontal and left occipital infarcts, and old right frontal infarct.  EF 55-60%.  LDL 68 and A1c 5.7.  Patient was found to have sustained A. fib, cardiology consulted, patient started on Eliquis and beta blocker, and continued on Zocor before discharge to [facility]. During the interval time, left hemiparesis much improved, now able to walk without any device, no significant hemiparetic gait.  Mild right hand dexterity difficulty.  Continue on Eliquis and Zocor without side effect.   However, still smoking.  Follow with cardiology, continue on atenolol for hypertrophic obstructive cardiomyopathy. TSH normal.  Plan:  - continue eliquis and simvastatin for stroke prevention  - follow up with cardiology - quit smoking - Follow up with your primary care physician for stroke risk factor modification. Recommend maintain blood pressure goal <130/80, diabetes with hemoglobin A1c goal below 7.0% and lipids with LDL cholesterol goal below 70 mg/dL.  - agree with increase daily activity - healthy diet and regular exercise - follow up in 4 months.    Dennie Bible, [facility], [facility], APRN  [facility] [address] [place], [place] [address] [phone]

## 2017-07-23 ENCOUNTER — Ambulatory Visit: Payer: Self-pay | Admitting: Nurse Practitioner

## 2017-07-23 ENCOUNTER — Telehealth: Payer: Self-pay | Admitting: *Deleted

## 2017-07-23 NOTE — Telephone Encounter (Signed)
Patient was no show for follow up with NP today.  

## 2017-07-24 ENCOUNTER — Encounter: Payer: Self-pay | Admitting: Nurse Practitioner

## 2017-08-18 ENCOUNTER — Telehealth: Payer: Self-pay

## 2017-08-18 NOTE — Telephone Encounter (Signed)
I called pt to offer him a sooner appt with Janett Billow, NP rather than this currently scheduled appt with Hoyle Sauer, NP. No answer, left a message asking him to call me back. If pt calls back, please discuss this with him and schedule as appropriate.

## 2017-08-18 NOTE — Telephone Encounter (Signed)
Thank you Arden!

## 2017-08-25 ENCOUNTER — Encounter: Payer: Self-pay | Admitting: Podiatry

## 2017-09-04 ENCOUNTER — Ambulatory Visit: Payer: Medicaid Other | Admitting: Nurse Practitioner

## 2017-09-04 ENCOUNTER — Telehealth: Payer: Self-pay | Admitting: *Deleted

## 2017-09-04 NOTE — Telephone Encounter (Signed)
Sonya with [facility] called phone staff, stated transportation for patient had not arrived. They are unable to get patient to appointment today on time. Rescheduled with Janett Billow, NP for next week. [facility] understands patient is to arrive 30 minutes before his appointment time.

## 2017-09-07 ENCOUNTER — Encounter: Payer: Self-pay | Admitting: Adult Health

## 2017-09-07 ENCOUNTER — Encounter: Payer: Self-pay | Admitting: Nurse Practitioner

## 2017-09-07 ENCOUNTER — Ambulatory Visit (INDEPENDENT_AMBULATORY_CARE_PROVIDER_SITE_OTHER): Payer: Medicare (Managed Care) | Admitting: Adult Health

## 2017-09-07 VITALS — BP 114/76 | HR 70 | Ht 70.0 in | Wt 117.8 lb

## 2017-09-07 DIAGNOSIS — I63411 Cerebral infarction due to embolism of right middle cerebral artery: Secondary | ICD-10-CM | POA: Diagnosis not present

## 2017-09-07 DIAGNOSIS — Z7901 Long term (current) use of anticoagulants: Secondary | ICD-10-CM

## 2017-09-07 DIAGNOSIS — I482 Chronic atrial fibrillation: Secondary | ICD-10-CM | POA: Diagnosis not present

## 2017-09-07 DIAGNOSIS — I10 Essential (primary) hypertension: Secondary | ICD-10-CM

## 2017-09-07 DIAGNOSIS — E785 Hyperlipidemia, unspecified: Secondary | ICD-10-CM

## 2017-09-07 NOTE — Patient Instructions (Signed)
Continue Eliquis (apixaban) daily  and simvastatin  for secondary stroke prevention  Continue to follow up with [facility] for cholesterol and blood pressure  Continue to follow up with cardiologist for atrial fibrillation  Continue to stay active and eat healthy  Maintain strict control of hypertension with blood pressure goal below 130/90, diabetes with hemoglobin A1c goal below 6.5% and cholesterol with LDL cholesterol (bad cholesterol) goal below 70 mg/dL. I also advised the patient to eat a healthy diet with plenty of whole grains, cereals, fruits and vegetables, exercise regularly and maintain ideal body weight.  Followup in the future with me as needed

## 2017-09-07 NOTE — Progress Notes (Signed)
STROKE NEUROLOGY FOLLOW UP NOTE  NAME: NIKE SOUTHWELL DOB: [birth year]  REASON FOR VISIT: stroke follow up HISTORY FROM: sister and chart  Today we had the pleasure of seeing Larwance Rote in follow-up at our Neurology Clinic. Pt was accompanied by his sister  History Summary Mr.Draven Andrewsis a 67 y.o.malewith history ofhypertension, smoker and recent prostate surgeryadmitted on 01/23/17 for left sidedweakness and confusion.Given tPA.  CTA head and neck showed right M2 occlusion, chronic right non-dominant VA occlusion, b/l siphon 50% stenosis. s/p mechanical thrombectomy with TICI3 reperfusion. MRI showed right MCA infarct, punctate right frontal and left occipital infarcts, and old right frontal infarct.  EF 55-60%.  LDL 68 and A1c 5.7.  Patient was found to have sustained A. fib, cardiology consulted, patient started on Eliquis, and continued on Zocor before discharge to CIR.  On discharge, he is to have mild left hemiparesis 4/5.  03/25/17 visit (JX): During the interval time, the patient has been doing well.  Left hemiparesis much improved, currently able to walk without any device, no significant hemiparetic gait.  Mild right hand dexterity difficulty.  Continue on Eliquis and Zocor without side effect.  However, still smoking.  BP 120/79 clinic.  Not checking at home.  Follow with cardiology, continue on atenolol for hypertrophic obstructive cardiomyopathy. TSH normal.  Update 09/07/17: Patient returns today for follow-up visit accompanied by his sister.  He recently underwent a prostate biopsy on 09/02/2017 and was told to wait until this appointment to get the clearance to restart Eliquis as he stopped this 1 day prior to procedure on 09/01/2017.  Recommended patient can safely restart Eliquis for atrial fibrillation and secondary stroke prevention.  Continues to take simvastatin without myalgias.  Blood pressure satisfactory 114/76.  Currently lives with his sister and  receives nursing care for bathing every Tuesday and Friday.  He does [facility] a week for exercise and interacting with other people.  Denies new or worsening stroke/TIA symptoms.   REVIEW OF SYSTEMS: Full 14 system review of systems performed and notable only for those listed below and in HPI above, all others are negative: Weakness   The following represents the patient's updated allergies and side effects list: No Known Allergies  The neurologically relevant items on the patient's problem list were reviewed on today's visit.  Neurologic Examination  A problem focused neurological exam (12 or more points of the single system neurologic examination, vital signs counts as 1 point, cranial nerves count for 8 points) was performed.  Blood pressure 114/76, pulse 70, height 5\' 10"  (1.778 m), weight 117 lb 12.8 oz (53.4 kg).  General - Well nourished, well developed, in no apparent distress.  Ophthalmologic - Fundi not visualized due to noncooperation.  Cardiovascular - irregularly irregular heart rate and rhythm.  Mental Status -  Mild developmental delay Level of arousal and orientation to year, age, and person were intact, but not orientated to place and month. Language including expression, naming, repetition, comprehension was assessed and found intact, with moderate dysarthria Fund of Knowledge was assessed and was impaired.  Cranial Nerves II - XII - II - Visual field intact OU. III, IV, VI - Extraocular movements intact. V - Facial sensation intact bilaterally. VII - Facial movement intact bilaterally. VIII - Hearing & vestibular intact bilaterally. X - Palate elevates symmetrically, moderate dysarthria. XI - Chin turning & shoulder shrug intact bilaterally. XII - Tongue protrusion intact.  Motor Strength - The patient's strength was normal in all  extremities and pronator drift was absent except mild left hand dexterity difficulty and mild left hand  decreased grip strength.  Bulk was normal and fasciculations were absent.   Motor Tone - Muscle tone was assessed at the neck and appendages and was normal.  Reflexes - The patient's reflexes were 1+ in all extremities and he had no pathological reflexes.  Sensory - Light touch, temperature/pinprick were assessed and were normal.    Coordination - The patient had mild left finger to nose dysmetria.  Tremor was absent.  Gait and Station - The patient's transfers, posture, gait, station, and turns were observed as normal.    Data reviewed: I personally reviewed the images and agree with the radiology interpretations.  Ct Angio Head W Or Wo Contrast Ct Angio Neck W And/or Wo Contrast 01/23/2017 IMPRESSION:  Atherosclerotic disease at both carotid bifurcations. No stenosis on the left. 25% stenosis on the right. Advanced atherosclerotic disease throughout both carotid siphon regions. Stenosis estimated at 50% in both siphon regions. No flow demonstrated in the superior division right M2 branches, consistent with the hyperdense vessel seen at head CT. The patient has had old infarction in the right frontal lobe, but I would assume today's findings or acute. Proximal right vertebral artery stenosis, maximal at 50% 1 cm beyond the vessel origin. Nonvisualization of the distal right vertebral artery which appears to be thrombosed or occluded by embolic disease. The left vertebral artery is the dominant vessel and is widely patent to the basilar. The exact age of the right vertebral artery occlusion cannot be establish with certainty.   Ct Head Wo Contrast 01/23/2017 IMPRESSION:  1. Minimal contrast staining adjacent to the M1 segment of the right middle cerebral artery status post intervention.No intraparenchymal hematoma or extra-axial collection.  2. Old right frontal lobe infarct.   Dg Chest Port 1 View 01/23/2017 IMPRESSION:  Tube positions as described without pneumothorax.  Postoperative change on the left. Bibasilar atelectasis. No edema or consolidation. Heart mildly enlarged. There is aortic atherosclerosis. Aortic Atherosclerosis   Ct Head Code Stroke Wo Contrast 01/23/2017  IMPRESSION:  1. No acute hemorrhage or mass lesion.  2. Hyperdense appearance of the right middle cerebral artery M2/M3 segments may indicate acute thrombus, particularly in the setting of left-sided weakness.Correlation with CTA recommended.  3. Old right frontal infarct.  4. ASPECTS is 10.   Transthoracic Echocardiogram 01/24/2017 Left ventricle: The cavity size was normal. There was hypertrophy of the apex. The appearance was consistent with apical varianthypertrophic cardiomyopathy. Systolic function was normal. Theestimated ejection fraction was in the range of 55% to 60%. Therewas no dynamic obstruction. Wall motion was normal; there were noregional wall motion abnormalities  CEREBRAL ANGIOGRAM 01/23/2017 S/P RT common carotid arteriogram followed complete revascularization of occluded RT MCA inferior and sup divisions with x 1 pass with 26mm x 40 mm solitaire FR retrieval device achieving a TICI 3 reperfusion    Assessment: As you may recall, he is a 67 y.o. African American male with right MCA infarct on 01/23/17 secondary to new onset atrial fibrillation. Patient started on Eliquis. Returns for follow up visit and overall doing well without new complaints or concerns.   Plan:  - continue eliquis and simvastatin for stroke prevention  -Advised patient and sister to restart Eliquis after stopping for as needed biopsy on 09/02/2017 - follow up with cardiology regarding atrial fibrillation management - [facility] with [facility] for stroke risk factor modification. Recommend maintain blood pressure goal <130/80, diabetes with  hemoglobin A1c goal below 7.0% and lipids with LDL cholesterol goal below 70 mg/dL.  -Continue to be active and eat a healthy diet   Follow up or  if new symptoms arise  Greater than 50% time during this 25 minute consultation visit was spent on counseling and coordination of care about a fib, HLD and HTN (risk factors), discussion about risk benefit of anticoagulation and answering questions.   Venancio Poisson, AGNP-BC  [facility] [address] [place], [place] [address]  Phone [phone] Fax [phone]

## 2017-09-11 NOTE — Progress Notes (Signed)
I reviewed above note and agree with the assessment and plan.   Rosalin Hawking, MD PhD Stroke Neurology 09/11/2017 7:02 AM

## 2017-09-24 ENCOUNTER — Encounter: Payer: Self-pay | Admitting: Radiation Oncology

## 2017-09-25 ENCOUNTER — Telehealth: Payer: Self-pay | Admitting: Oncology

## 2017-09-25 NOTE — Telephone Encounter (Signed)
Received a call from [place] at [facility] to cancel the pt's medical oncology appt. Stated the pt only need radonc. Appt cancelled

## 2017-10-14 ENCOUNTER — Ambulatory Visit: Payer: Medicare Other | Admitting: Oncology

## 2017-10-16 ENCOUNTER — Encounter: Payer: Self-pay | Admitting: Radiation Oncology

## 2017-10-16 NOTE — Progress Notes (Signed)
GU Location of Tumor / Histology: prostatic adenocarcinoma  If Prostate Cancer, Gleason Score is (4 + 3) and PSA is (7.86) on 01/14/2017. Prostate volume: 19.93 mL  Ian Hill was diagnosed with prostate cancer in July 2018 following a TURP. PSA was approximately 20. Staging was delayed for several months due to a stroke. Repeat biopsy done 09/02/2017 revealed large volume disease.  Biopsies of prostate (if applicable) revealed:    Past/Anticipated interventions by urology, if any: TURP, prostate biopsy, referral to radiation oncology to discuss combination radiation/ADT  Past/Anticipated interventions by medical oncology, if any: no  Weight changes, if any: no  Bowel/Bladder complaints, if any:  IPSS 1. Denies dysuria, hematuria, urinary leakage or incontinence.   Nausea/Vomiting, if any: no  Pain issues, if any:  no  SAFETY ISSUES:  Prior radiation? no  Pacemaker/ICD? no  Possible current pregnancy? no  Is the patient on methotrexate? no  Current Complaints / other details:  67 year old male. Single. NKDA. [facility]ses [facility]. Stays at [facility] facilities on Tuesday and Thursday to give his sister a break. [facility] nurse goes to their home on Wednesday and Friday to bath him. Patient resides in [place] with his sister.

## 2017-10-19 ENCOUNTER — Encounter: Payer: Self-pay | Admitting: Radiation Oncology

## 2017-10-19 ENCOUNTER — Other Ambulatory Visit: Payer: Self-pay

## 2017-10-19 ENCOUNTER — Encounter: Payer: Self-pay | Admitting: Medical Oncology

## 2017-10-19 ENCOUNTER — Ambulatory Visit
Admission: RE | Admit: 2017-10-19 | Discharge: 2017-10-19 | Disposition: A | Discharge status: Home / Self Care | Payer: Medicare (Managed Care) | Source: Ambulatory Visit | Attending: Radiation Oncology | Admitting: Radiation Oncology

## 2017-10-19 VITALS — BP 143/96 | HR 84 | Temp 98.1°F | Resp 18 | Ht 71.0 in | Wt 116.0 lb

## 2017-10-19 DIAGNOSIS — E785 Hyperlipidemia, unspecified: Secondary | ICD-10-CM | POA: Diagnosis not present

## 2017-10-19 DIAGNOSIS — F17210 Nicotine dependence, cigarettes, uncomplicated: Secondary | ICD-10-CM | POA: Diagnosis not present

## 2017-10-19 DIAGNOSIS — N182 Chronic kidney disease, stage 2 (mild): Secondary | ICD-10-CM | POA: Insufficient documentation

## 2017-10-19 DIAGNOSIS — C61 Malignant neoplasm of prostate: Secondary | ICD-10-CM

## 2017-10-19 DIAGNOSIS — Z8673 Personal history of transient ischemic attack (TIA), and cerebral infarction without residual deficits: Secondary | ICD-10-CM | POA: Diagnosis not present

## 2017-10-19 DIAGNOSIS — I130 Hypertensive heart and chronic kidney disease with heart failure and stage 1 through stage 4 chronic kidney disease, or unspecified chronic kidney disease: Secondary | ICD-10-CM | POA: Diagnosis not present

## 2017-10-19 DIAGNOSIS — Z809 Family history of malignant neoplasm, unspecified: Secondary | ICD-10-CM | POA: Insufficient documentation

## 2017-10-19 DIAGNOSIS — Z7901 Long term (current) use of anticoagulants: Secondary | ICD-10-CM | POA: Insufficient documentation

## 2017-10-19 DIAGNOSIS — Z79899 Other long term (current) drug therapy: Secondary | ICD-10-CM | POA: Insufficient documentation

## 2017-10-19 DIAGNOSIS — I340 Nonrheumatic mitral (valve) insufficiency: Secondary | ICD-10-CM | POA: Insufficient documentation

## 2017-10-19 DIAGNOSIS — I5032 Chronic diastolic (congestive) heart failure: Secondary | ICD-10-CM | POA: Insufficient documentation

## 2017-10-19 HISTORY — DX: Malignant neoplasm of prostate: C61

## 2017-10-19 NOTE — Progress Notes (Signed)
Introduced myself to Mr. Ian Hill and his sister as the prostate nurse navigator and my role. He was diagnosed with prostate cancer in July of 2018.Staging was delayed due to a stroke. In April 2019 he underwent biopsy and he is here today to discuss his treatment options. Due to TURP and stoke he is a good candidate for external beam radiation. [name] which will help with transportation to radiation. I gave his sister my card and asked her to call me with questions or concerns. He will need ADT with Dr. Alan Ripper office. I asked her to call me or Bryson Ha if she has not received an appointment by  Friday. She voiced understanding.

## 2017-10-19 NOTE — Progress Notes (Signed)
Radiation Oncology         [phone] ________________________________  Initial outpatient Consultation  Name: Ian Hill MRN: [id number]  Date: 10/19/2017  DOB: 13-Apr-1951  RN:[id number], Morey Hummingbird, NP  Franchot Gallo, MD   REFERRING PHYSICIAN: Franchot Gallo, MD  DIAGNOSIS: 67 y.o. gentleman with unfavorable intermediate risk Stage T1a adenocarcinoma of the prostate with Gleason Score of 4+3=7, and PSA of 7    ICD-10-CM   1. Prostate cancer [facility]) C61     HISTORY OF PRESENT ILLNESS: Ian Hill is a 67 y.o. male with a diagnosis of prostate cancer. [name] followed by Dr. Dawna Part in primary care and Dr. Diona Fanti in urology. He had a prior biopsy of the prostate in 2011 that was negative, and he's been followed for BPH. He underwent a TURP for LUTS due to obstructing BPH on 11/24/16 with high preoperative PSA of 20. During his procedure, 37 g of tissue was removed and sent to pathology which returned adenocarcinoma in 2% of the specimen, with a Gleason Score of 3+4=7. He underwent CT abd/pel and bone scan on 02/20/17 with both negative for evidence of metastatic prostate cancer. Treatment was delayed for several months due to a stroke. The patient proceeded to transrectal ultrasound with 6 biopsies of the prostate on 09/02/17.  The prostate volume measured 19.93 cc. Out of 6 core biopsies,4 were positive. The maximum Gleason score was 4+3=7, and this was seen in left base. The patient reviewed the biopsy results with his urologist and he has kindly been referred today for discussion of potential radiation treatment options.   PREVIOUS RADIATION THERAPY: No  PAST MEDICAL HISTORY:  Past Medical History:  Diagnosis Date  . Abnormal ECG   . BPH (benign prostatic hyperplasia)   . CKD (chronic kidney disease), stage II   . Diverticulosis   . Dyslipidemia   . Elevated PSA   . HTN (hypertension)   . Hypertension   . Hypertensive heart disease   . Hypertrophic  cardiomegaly, apical variant   . Hypertrophic obstructive cardiomyopathy with diastolic heart failure ([place])   . Mitral regurgitation   . Prostate cancer ([place])   . Stroke ([place]) 01/2017   x 2  . Urine retention       PAST SURGICAL HISTORY: Past Surgical History:  Procedure Laterality Date  . 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 CARDIOVASCULAR STRESS TEST  11-19-2016  dr Stanford Breed   Low risk nuclear study/  normal resting and stress perfusion with no ischemia or infarction/  EF not done due to PVCIs , TID borderline, 1.2 significant LVH  . IR PERCUTANEOUS ART THROMBECTOMY/INFUSION INTRACRANIAL INC DIAG ANGIO  01/23/2017  . RADIOLOGY WITH ANESTHESIA N/A 01/23/2017   Procedure: RADIOLOGY WITH ANESTHESIA;  Surgeon: Luanne Bras, MD;  Location: [facility];  Service: Radiology;  Laterality: N/A;  . TRANSTHORACIC ECHOCARDIOGRAM  11-20-2016   dr Stanford Breed   mild LVH,  apical hypertrophic cardiomyopathy, systolic function vigorous,  ef 65-70%,  grade 2 diastolic dysfunction/  mild AR, MR, and  PR/  moderate LAE and RAE/  mild to mod. TR/  moderate increase PASP 27mHg  . TRANSURETHRAL RESECTION OF PROSTATE N/A 11/24/2016   Procedure: TRANSURETHRAL RESECTION OF THE PROSTATE (TURP);  Surgeon: DFranchot Gallo MD;  [facility]  Service: Urology;  Laterality: N/A;    FAMILY HISTORY:  Family History  Problem Relation Age of Onset  . Cancer Paternal Aunt  unknown    SOCIAL HISTORY:  Social History   Socioeconomic History  . Marital status: Single    Spouse name: Not on file  . Number of children: Not on file  . Years of education: Not on file  . Highest education level: Not on file  Occupational History    Comment: disabled  Social Needs  . Financial resource strain: Not on file  . Food insecurity:    Worry: Not on file    Inability: Not on file  . Transportation needs:    Medical: Not on file    Non-medical: Not on file  Tobacco  Use  . Smoking status: Current Some Day Smoker    Packs/day: 0.50    Years: 51.00    Pack years: 25.50    Types: Cigarettes  . Smokeless tobacco: Never Used  Substance and Sexual Activity  . Alcohol use: No    Frequency: Never  . Drug use: No  . Sexual activity: Not Currently  Lifestyle  . Physical activity:    Days per week: Not on file    Minutes per session: Not on file  . Stress: Not on file  Relationships  . Social connections:    Talks on phone: Not on file    Gets together: Not on file    Attends religious service: Not on file    Active member of club or organization: Not on file    Attends meetings of clubs or organizations: Not on file    Relationship status: Not on file  . Intimate partner violence:    Fear of current or ex partner: Not on file    Emotionally abused: Not on file    Physically abused: Not on file    Forced sexual activity: Not on file  Other Topics Concern  . Not on file  Social History Narrative   ** Merged History Encounter **      Resides with sister, Ian Hill        ALLERGIES: Patient has no known allergies.  MEDICATIONS:  Current Outpatient Medications  Medication Sig Dispense Refill  . apixaban (ELIQUIS) 5 MG TABS tablet Take 1 tablet (5 mg total) by mouth 2 (two) times daily. 60 tablet 0  . atenolol (TENORMIN) 50 MG tablet TAKE 1 TABLET (50 MG TOTAL) BY MOUTH EVERY MORNING. 30 tablet 4  . feeding supplement, ENSURE ENLIVE, (ENSURE ENLIVE) LIQD Take 237 mLs by mouth 2 (two) times daily between meals. 237 mL 12  . finasteride (PROSCAR) 5 MG tablet Take 1 tablet (5 mg total) by mouth daily. 30 tablet 0  . hydrochlorothiazide (HYDRODIURIL) 12.5 MG tablet Take 1 tablet (12.5 mg total) by mouth daily. 90 tablet 2  . polyethylene glycol (MIRALAX / GLYCOLAX) packet Take 17 g by mouth daily.    . simvastatin (ZOCOR) 40 MG tablet TAKE 1 TABLET (40 MG TOTAL) BY MOUTH AT BEDTIME. 90 tablet 2  . tamsulosin (FLOMAX) 0.4 MG CAPS capsule Take 1  capsule (0.4 mg total) by mouth daily. 30 capsule 0   No current facility-administered medications for this encounter.     REVIEW OF SYSTEMS:  On review of systems, the patient reports that he is doing well overall. He denies any chest pain, shortness of breath, cough, fevers, chills, night sweats, unintended weight changes. He denies any bowel disturbances, and denies abdominal pain, nausea or vomiting. He denies any new musculoskeletal or joint aches or pains. His IPSS was 1, indicating mild urinary symptoms. He has not  been sexually active in some time. He does have speech slurring as a result of his  Stroke. He denies difficulty with gait disturbances. A complete review of systems is obtained and is otherwise negative.    PHYSICAL EXAM:  Wt Readings from Last 3 Encounters:  10/19/17 116 lb (52.6 kg)  09/07/17 117 lb 12.8 oz (53.4 kg)  06/29/17 121 lb 9.6 oz (55.2 kg)   Temp Readings from Last 3 Encounters:  10/19/17 98.1 F (36.7 C) (Oral)  02/28/17 97.9 F (36.6 C)  02/25/17 98.2 F (36.8 C) (Oral)   BP Readings from Last 3 Encounters:  10/19/17 (!) 143/96  09/07/17 114/76  06/29/17 122/66   Pulse Readings from Last 3 Encounters:  10/19/17 84  09/07/17 70  06/29/17 74   Pain Assessment Pain Score: 0-No pain/10  In general this is a well appearing African-American male in no acute distress. [name] alert and oriented x4 and appropriate throughout the examination. HEENT reveals that the patient is normocephalic, atraumatic. EOMs are intact. PERRLA. Skin is intact without any evidence of gross lesions. Cardiopulmonary assessment is negative for acute distress and he exhibits normal effort.    KPS = 90  100 - Normal; no complaints; no evidence of disease. 90   - Able to carry on normal activity; minor signs or symptoms of disease. 80   - Normal activity with effort; some signs or symptoms of disease. 52   - Cares for self; unable to carry on normal activity or to do active  work. 60   - Requires occasional assistance, but is able to care for most of his personal needs. 50   - Requires considerable assistance and frequent medical care. 50   - Disabled; requires special care and assistance. 55   - Severely disabled; hospital admission is indicated although death not imminent. 9   - Very sick; hospital admission necessary; active supportive treatment necessary. 10   - Moribund; fatal processes progressing rapidly. 0     - Dead  Karnofsky DA, Abelmann Chester, Craver LS and Burchenal [facility] [phone]) The use of the nitrogen mustards in the palliative treatment of carcinoma: with particular reference to bronchogenic carcinoma Cancer 1 634-56  LABORATORY DATA:  Lab Results  Component Value Date   WBC 3.6 03/11/2017   HGB 12.0 (L) 03/11/2017   HCT 38.8 03/11/2017   MCV 92 03/11/2017   PLT 151 03/11/2017   Lab Results  Component Value Date   NA 143 03/11/2017   K 4.5 03/11/2017   CL 101 03/11/2017   CO2 27 03/11/2017   Lab Results  Component Value Date   ALT 27 01/29/2017   AST 43 (H) 01/29/2017   ALKPHOS 95 01/29/2017   BILITOT 0.8 01/29/2017     RADIOGRAPHY: No results found.    IMPRESSION/PLAN: 1. 67 y.o. gentleman with unfavorable intermediate risk Stage T1a adenocarcinoma of the prostate with Gleason Score of 4+3=7, and PSA of 7. We met with the patient and family about the findings and work-up thus far.  We discussed the natural history of prostate cancer and general treatment, highlighting the role of radiotherapy in the management.  Given comorbidities and his TURP, this patient is not a good candidate for brachytherapy or prostatectomy. Rather, he is a candidate for external beam radiotherapy with the addition of ADT. We discussed external beam radiation therapy  and reviewed the anticipated acute and late sequelae associated with radiation in this setting. We also discussed the need for 8 weeks of  radiation treatment and ADT injections considering his  preTURP PSA of 20. The patient will follow up with Dr. Diona Fanti and proceed with ADT. Following this he will need fiducial markers and spaceOAR gel in the office. In about 2 months from the time of his ADT injection, we will meet back and proceed with simulation. The patient and his sister are in agreement.  I spent 60 minutes minutes face to face with the patient and more than 50% of that time was spent in counseling and/or coordination of care.     Carola Rhine, [facility]   Page Me  Seen with  _____________________________________  Sheral Apley Tammi Klippel, M.D.  This document serves as a record of services personally performed by Tyler Pita, MD and Shona Simpson, PA-C. It was created on their behalf by Linward Natal, a trained medical scribe. The creation of this record is based on the scribe's personal observations and the provider's statements to them. This document has been checked and approved by the attending provider.

## 2017-10-19 NOTE — Progress Notes (Signed)
See progress noted under physician encounter.  

## 2017-10-28 ENCOUNTER — Telehealth: Payer: Self-pay | Admitting: *Deleted

## 2017-10-28 NOTE — Telephone Encounter (Signed)
Called patient to inform of hormone inj. for 11-03-17 @ 10:30 am @ Dr. Alan Ripper Office, spoke with patient's sister- [name] and she is aware of this appt.

## 2017-10-29 ENCOUNTER — Telehealth: Payer: Self-pay | Admitting: Radiation Oncology

## 2017-10-29 NOTE — Telephone Encounter (Signed)
LM for pt's sister to call back about his appt for fiducials/spaceOAR. I called and ADT is scheduled for [facility] 11/03/17 when I called [facility] they will coordinate his fiducials/SpaceOAR. i've asked simulation to coordinate his sim the week of labor day.

## 2017-11-03 ENCOUNTER — Encounter: Payer: Self-pay | Admitting: Medical Oncology

## 2017-11-13 ENCOUNTER — Other Ambulatory Visit: Payer: Self-pay

## 2017-11-13 ENCOUNTER — Emergency Department (HOSPITAL_COMMUNITY): Payer: Medicare (Managed Care)

## 2017-11-13 ENCOUNTER — Inpatient Hospital Stay (HOSPITAL_COMMUNITY)
Admission: EM | Admit: 2017-11-13 | Discharge: 2017-11-15 | DRG: 372 | Disposition: A | Discharge status: Home / Self Care | Payer: Medicare (Managed Care) | Attending: Internal Medicine | Admitting: Internal Medicine

## 2017-11-13 ENCOUNTER — Encounter (HOSPITAL_COMMUNITY): Payer: Self-pay | Admitting: Emergency Medicine

## 2017-11-13 DIAGNOSIS — R197 Diarrhea, unspecified: Secondary | ICD-10-CM | POA: Diagnosis not present

## 2017-11-13 DIAGNOSIS — Z720 Tobacco use: Secondary | ICD-10-CM

## 2017-11-13 DIAGNOSIS — I482 Chronic atrial fibrillation: Secondary | ICD-10-CM

## 2017-11-13 DIAGNOSIS — J181 Lobar pneumonia, unspecified organism: Secondary | ICD-10-CM

## 2017-11-13 DIAGNOSIS — Z8611 Personal history of tuberculosis: Secondary | ICD-10-CM

## 2017-11-13 DIAGNOSIS — Z7901 Long term (current) use of anticoagulants: Secondary | ICD-10-CM

## 2017-11-13 DIAGNOSIS — R1110 Vomiting, unspecified: Secondary | ICD-10-CM | POA: Diagnosis present

## 2017-11-13 DIAGNOSIS — F17210 Nicotine dependence, cigarettes, uncomplicated: Secondary | ICD-10-CM | POA: Diagnosis present

## 2017-11-13 DIAGNOSIS — N400 Enlarged prostate without lower urinary tract symptoms: Secondary | ICD-10-CM | POA: Diagnosis present

## 2017-11-13 DIAGNOSIS — N179 Acute kidney failure, unspecified: Secondary | ICD-10-CM

## 2017-11-13 DIAGNOSIS — I421 Obstructive hypertrophic cardiomyopathy: Secondary | ICD-10-CM | POA: Diagnosis present

## 2017-11-13 DIAGNOSIS — A044 Other intestinal Escherichia coli infections: Secondary | ICD-10-CM | POA: Diagnosis not present

## 2017-11-13 DIAGNOSIS — N182 Chronic kidney disease, stage 2 (mild): Secondary | ICD-10-CM | POA: Diagnosis present

## 2017-11-13 DIAGNOSIS — I130 Hypertensive heart and chronic kidney disease with heart failure and stage 1 through stage 4 chronic kidney disease, or unspecified chronic kidney disease: Secondary | ICD-10-CM | POA: Diagnosis present

## 2017-11-13 DIAGNOSIS — I5032 Chronic diastolic (congestive) heart failure: Secondary | ICD-10-CM | POA: Diagnosis present

## 2017-11-13 DIAGNOSIS — I959 Hypotension, unspecified: Secondary | ICD-10-CM | POA: Diagnosis present

## 2017-11-13 DIAGNOSIS — R9431 Abnormal electrocardiogram [ECG] [EKG]: Secondary | ICD-10-CM | POA: Diagnosis not present

## 2017-11-13 DIAGNOSIS — R1084 Generalized abdominal pain: Secondary | ICD-10-CM | POA: Diagnosis not present

## 2017-11-13 DIAGNOSIS — N289 Disorder of kidney and ureter, unspecified: Secondary | ICD-10-CM

## 2017-11-13 DIAGNOSIS — A4189 Other specified sepsis: Secondary | ICD-10-CM

## 2017-11-13 DIAGNOSIS — I340 Nonrheumatic mitral (valve) insufficiency: Secondary | ICD-10-CM | POA: Diagnosis present

## 2017-11-13 DIAGNOSIS — Z8673 Personal history of transient ischemic attack (TIA), and cerebral infarction without residual deficits: Secondary | ICD-10-CM

## 2017-11-13 DIAGNOSIS — J479 Bronchiectasis, uncomplicated: Secondary | ICD-10-CM

## 2017-11-13 DIAGNOSIS — I422 Other hypertrophic cardiomyopathy: Secondary | ICD-10-CM

## 2017-11-13 DIAGNOSIS — I480 Paroxysmal atrial fibrillation: Secondary | ICD-10-CM | POA: Diagnosis present

## 2017-11-13 DIAGNOSIS — R112 Nausea with vomiting, unspecified: Secondary | ICD-10-CM

## 2017-11-13 DIAGNOSIS — Z902 Acquired absence of lung [part of]: Secondary | ICD-10-CM

## 2017-11-13 DIAGNOSIS — A084 Viral intestinal infection, unspecified: Secondary | ICD-10-CM

## 2017-11-13 DIAGNOSIS — Z8709 Personal history of other diseases of the respiratory system: Secondary | ICD-10-CM

## 2017-11-13 DIAGNOSIS — I110 Hypertensive heart disease with heart failure: Secondary | ICD-10-CM | POA: Diagnosis not present

## 2017-11-13 DIAGNOSIS — E785 Hyperlipidemia, unspecified: Secondary | ICD-10-CM | POA: Diagnosis present

## 2017-11-13 DIAGNOSIS — R7881 Bacteremia: Secondary | ICD-10-CM

## 2017-11-13 DIAGNOSIS — E861 Hypovolemia: Secondary | ICD-10-CM | POA: Diagnosis present

## 2017-11-13 DIAGNOSIS — C61 Malignant neoplasm of prostate: Secondary | ICD-10-CM | POA: Diagnosis present

## 2017-11-13 DIAGNOSIS — Z8546 Personal history of malignant neoplasm of prostate: Secondary | ICD-10-CM

## 2017-11-13 DIAGNOSIS — E872 Acidosis: Secondary | ICD-10-CM | POA: Diagnosis present

## 2017-11-13 DIAGNOSIS — B9620 Unspecified Escherichia coli [E. coli] as the cause of diseases classified elsewhere: Secondary | ICD-10-CM

## 2017-11-13 DIAGNOSIS — I4821 Permanent atrial fibrillation: Secondary | ICD-10-CM

## 2017-11-13 DIAGNOSIS — Z79899 Other long term (current) drug therapy: Secondary | ICD-10-CM

## 2017-11-13 DIAGNOSIS — Z9079 Acquired absence of other genital organ(s): Secondary | ICD-10-CM

## 2017-11-13 DIAGNOSIS — J189 Pneumonia, unspecified organism: Secondary | ICD-10-CM

## 2017-11-13 LAB — COMPREHENSIVE METABOLIC PANEL
ALBUMIN: 3.7 g/dL (ref 3.5–5.0)
ALK PHOS: 90 U/L (ref 38–126)
ALT: 26 U/L (ref 0–44)
ALT: 26 U/L (ref 0–44)
ANION GAP: 8 (ref 5–15)
AST: 34 U/L (ref 15–41)
AST: 37 U/L (ref 15–41)
Albumin: 3.1 g/dL — ABNORMAL LOW (ref 3.5–5.0)
Alkaline Phosphatase: 107 U/L (ref 38–126)
Anion gap: 12 (ref 5–15)
BILIRUBIN TOTAL: 1.2 mg/dL (ref 0.3–1.2)
BILIRUBIN TOTAL: 1.3 mg/dL — AB (ref 0.3–1.2)
BUN: 28 mg/dL — AB (ref 8–23)
BUN: 29 mg/dL — AB (ref 8–23)
CALCIUM: 8.3 mg/dL — AB (ref 8.9–10.3)
CHLORIDE: 103 mmol/L (ref 98–111)
CO2: 20 mmol/L — ABNORMAL LOW (ref 22–32)
CO2: 29 mmol/L (ref 22–32)
Calcium: 9.4 mg/dL (ref 8.9–10.3)
Chloride: 106 mmol/L (ref 98–111)
Creatinine, Ser: 1.4 mg/dL — ABNORMAL HIGH (ref 0.61–1.24)
Creatinine, Ser: 1.43 mg/dL — ABNORMAL HIGH (ref 0.61–1.24)
GFR calc Af Amer: 58 mL/min — ABNORMAL LOW (ref >60)
GFR calc Af Amer: 57 mL/min — ABNORMAL LOW (ref >60)
GFR calc non Af Amer: 50 mL/min — ABNORMAL LOW (ref >60)
GFR, EST NON AFRICAN AMERICAN: 49 mL/min — AB (ref >60)
GLUCOSE: 75 mg/dL (ref 70–99)
Glucose, Bld: 80 mg/dL (ref 70–99)
POTASSIUM: 3.6 mmol/L (ref 3.5–5.1)
Potassium: 4 mmol/L (ref 3.5–5.1)
SODIUM: 140 mmol/L (ref 135–145)
Sodium: 138 mmol/L (ref 135–145)
TOTAL PROTEIN: 5.5 g/dL — AB (ref 6.5–8.1)
TOTAL PROTEIN: 6.6 g/dL (ref 6.5–8.1)

## 2017-11-13 LAB — DIFFERENTIAL
BAND NEUTROPHILS: 0 %
BASOS PCT: 0 %
Basophils Absolute: 0 10*3/uL (ref 0.0–0.1)
Blasts: 0 %
Eosinophils Absolute: 0 10*3/uL (ref 0.0–0.7)
Eosinophils Relative: 1 %
LYMPHS PCT: 9 %
Lymphs Abs: 0.2 10*3/uL — ABNORMAL LOW (ref 0.7–4.0)
METAMYELOCYTES PCT: 0 %
MONO ABS: 0 10*3/uL — AB (ref 0.1–1.0)
MONOS PCT: 1 %
Myelocytes: 0 %
Neutro Abs: 2 10*3/uL (ref 1.7–7.7)
Neutrophils Relative %: 89 %
OTHER: 0 %
Promyelocytes Relative: 0 %
nRBC: 0 /100 WBC

## 2017-11-13 LAB — CBC
HEMATOCRIT: 47.7 % (ref 39.0–52.0)
HEMOGLOBIN: 14.7 g/dL (ref 13.0–17.0)
MCH: 28.7 pg (ref 26.0–34.0)
MCHC: 30.8 g/dL (ref 30.0–36.0)
MCV: 93 fL (ref 78.0–100.0)
Platelets: 135 10*3/uL — ABNORMAL LOW (ref 150–400)
RBC: 5.13 MIL/uL (ref 4.22–5.81)
RDW: 13.7 % (ref 11.5–15.5)
WBC: 2.2 10*3/uL — ABNORMAL LOW (ref 4.0–10.5)

## 2017-11-13 LAB — URINALYSIS, ROUTINE W REFLEX MICROSCOPIC
BACTERIA UA: NONE SEEN
BILIRUBIN URINE: NEGATIVE
Glucose, UA: NEGATIVE mg/dL
KETONES UR: NEGATIVE mg/dL
Leukocytes, UA: NEGATIVE
NITRITE: NEGATIVE
Protein, ur: NEGATIVE mg/dL
SPECIFIC GRAVITY, URINE: 1.045 — AB (ref 1.005–1.030)
pH: 5 (ref 5.0–8.0)

## 2017-11-13 LAB — BLOOD CULTURE ID PANEL (REFLEXED)
Acinetobacter baumannii: NOT DETECTED
CANDIDA KRUSEI: NOT DETECTED
CANDIDA PARAPSILOSIS: NOT DETECTED
CANDIDA TROPICALIS: NOT DETECTED
Candida albicans: NOT DETECTED
Candida glabrata: NOT DETECTED
Carbapenem resistance: NOT DETECTED
ENTEROBACTERIACEAE SPECIES: DETECTED — AB
ENTEROCOCCUS SPECIES: NOT DETECTED
ESCHERICHIA COLI: DETECTED — AB
Enterobacter cloacae complex: NOT DETECTED
Haemophilus influenzae: NOT DETECTED
KLEBSIELLA OXYTOCA: NOT DETECTED
Klebsiella pneumoniae: NOT DETECTED
Listeria monocytogenes: NOT DETECTED
Neisseria meningitidis: NOT DETECTED
PROTEUS SPECIES: NOT DETECTED
Pseudomonas aeruginosa: NOT DETECTED
SERRATIA MARCESCENS: NOT DETECTED
STAPHYLOCOCCUS SPECIES: NOT DETECTED
Staphylococcus aureus (BCID): NOT DETECTED
Streptococcus agalactiae: NOT DETECTED
Streptococcus pneumoniae: NOT DETECTED
Streptococcus pyogenes: NOT DETECTED
Streptococcus species: NOT DETECTED

## 2017-11-13 LAB — CBC WITH DIFFERENTIAL/PLATELET
BASOS ABS: 0 10*3/uL (ref 0.0–0.1)
Basophils Relative: 0 %
EOS PCT: 0 %
Eosinophils Absolute: 0 10*3/uL (ref 0.0–0.7)
HEMATOCRIT: 44.7 % (ref 39.0–52.0)
Hemoglobin: 13.8 g/dL (ref 13.0–17.0)
Lymphocytes Relative: 2 %
Lymphs Abs: 0.3 10*3/uL — ABNORMAL LOW (ref 0.7–4.0)
MCH: 29 pg (ref 26.0–34.0)
MCHC: 30.9 g/dL (ref 30.0–36.0)
MCV: 93.9 fL (ref 78.0–100.0)
MONO ABS: 0 10*3/uL — AB (ref 0.1–1.0)
Monocytes Relative: 0 %
Neutro Abs: 12.7 10*3/uL — ABNORMAL HIGH (ref 1.7–7.7)
Neutrophils Relative %: 98 %
Platelets: 127 10*3/uL — ABNORMAL LOW (ref 150–400)
RBC: 4.76 MIL/uL (ref 4.22–5.81)
RDW: 13.9 % (ref 11.5–15.5)
WBC: 13 10*3/uL — AB (ref 4.0–10.5)

## 2017-11-13 LAB — LACTIC ACID, PLASMA
LACTIC ACID, VENOUS: 3 mmol/L — AB (ref 0.5–1.9)
LACTIC ACID, VENOUS: 1.7 mmol/L (ref 0.5–1.9)

## 2017-11-13 LAB — I-STAT TROPONIN, ED: TROPONIN I, POC: 0.05 ng/mL (ref 0.00–0.08)

## 2017-11-13 LAB — I-STAT CG4 LACTIC ACID, ED
Lactic Acid, Venous: 3.79 mmol/L (ref 0.5–1.9)
Lactic Acid, Venous: 3.91 mmol/L (ref 0.5–1.9)

## 2017-11-13 LAB — LIPASE, BLOOD: LIPASE: 49 U/L (ref 11–51)

## 2017-11-13 LAB — HIV ANTIBODY (ROUTINE TESTING W REFLEX): HIV Screen 4th Generation wRfx: NONREACTIVE

## 2017-11-13 MED ORDER — IOPAMIDOL (ISOVUE-370) INJECTION 76%
100.0000 mL | Freq: Once | INTRAVENOUS | Status: AC | PRN
  Administered 2017-11-13: 100 mL via INTRAVENOUS

## 2017-11-13 MED ORDER — ONDANSETRON HCL 4 MG PO TABS: 4.0000 mg | ORAL_TABLET | Freq: Four times a day (QID) | ORAL | Status: DC | PRN

## 2017-11-13 MED ORDER — ONDANSETRON HCL 4 MG/2ML IJ SOLN
4.0000 mg | Freq: Four times a day (QID) | INTRAMUSCULAR | Status: DC | PRN
  Administered 2017-11-13 (×2): 4 mg via INTRAVENOUS
  Filled 2017-11-13 (×2): qty 2

## 2017-11-13 MED ORDER — FENTANYL CITRATE (PF) 100 MCG/2ML IJ SOLN
50.0000 ug | Freq: Once | INTRAMUSCULAR | Status: DC
  Filled 2017-11-13: qty 2

## 2017-11-13 MED ORDER — IOPAMIDOL (ISOVUE-370) INJECTION 76%
INTRAVENOUS | Status: AC
  Filled 2017-11-13: qty 100

## 2017-11-13 MED ORDER — SODIUM CHLORIDE 0.9 % IV SOLN
Freq: Once | INTRAVENOUS | Status: AC
  Administered 2017-11-13: 02:00:00 via INTRAVENOUS

## 2017-11-13 MED ORDER — SODIUM CHLORIDE 0.9 % IV SOLN: 500.0000 mg | INTRAVENOUS | Status: DC

## 2017-11-13 MED ORDER — APIXABAN 5 MG PO TABS
5.0000 mg | ORAL_TABLET | Freq: Two times a day (BID) | ORAL | Status: DC
  Administered 2017-11-13 – 2017-11-15 (×5): 5 mg via ORAL
  Filled 2017-11-13 (×5): qty 1

## 2017-11-13 MED ORDER — ONDANSETRON HCL 4 MG/2ML IJ SOLN
4.0000 mg | Freq: Once | INTRAMUSCULAR | Status: DC
  Filled 2017-11-13: qty 2

## 2017-11-13 MED ORDER — ENSURE ENLIVE PO LIQD
1.0000 | Freq: Three times a day (TID) | ORAL | Status: DC
  Administered 2017-11-13 – 2017-11-14 (×4): 237 mL via ORAL

## 2017-11-13 MED ORDER — SODIUM CHLORIDE 0.9 % IV BOLUS
500.0000 mL | Freq: Once | INTRAVENOUS | Status: AC
  Administered 2017-11-13: 500 mL via INTRAVENOUS

## 2017-11-13 MED ORDER — SODIUM CHLORIDE 0.9 % IV SOLN: 2.0000 g | INTRAVENOUS | Status: DC

## 2017-11-13 MED ORDER — ACETAMINOPHEN 650 MG RE SUPP: 650.0000 mg | Freq: Four times a day (QID) | RECTAL | Status: DC | PRN

## 2017-11-13 MED ORDER — SODIUM CHLORIDE 0.9 % IV BOLUS (SEPSIS): 1000.0000 mL | Freq: Once | INTRAVENOUS | Status: DC

## 2017-11-13 MED ORDER — SODIUM CHLORIDE 0.9 % IV BOLUS (SEPSIS): 500.0000 mL | Freq: Once | INTRAVENOUS | Status: DC

## 2017-11-13 MED ORDER — SODIUM CHLORIDE 0.9 % IV SOLN
2.0000 g | INTRAVENOUS | Status: DC
  Administered 2017-11-13 – 2017-11-14 (×2): 2 g via INTRAVENOUS
  Filled 2017-11-13 (×3): qty 20

## 2017-11-13 MED ORDER — SODIUM CHLORIDE 0.9 % IV BOLUS
1000.0000 mL | Freq: Once | INTRAVENOUS | Status: AC
  Administered 2017-11-13: 1000 mL via INTRAVENOUS

## 2017-11-13 MED ORDER — SODIUM CHLORIDE 0.9 % IV BOLUS (SEPSIS)
250.0000 mL | Freq: Once | INTRAVENOUS | Status: AC
  Administered 2017-11-13: 250 mL via INTRAVENOUS

## 2017-11-13 MED ORDER — SIMVASTATIN 40 MG PO TABS
40.0000 mg | ORAL_TABLET | Freq: Every day | ORAL | Status: DC
  Administered 2017-11-13 – 2017-11-15 (×3): 40 mg via ORAL
  Filled 2017-11-13 (×3): qty 1

## 2017-11-13 MED ORDER — LEVOFLOXACIN IN D5W 500 MG/100ML IV SOLN
500.0000 mg | Freq: Once | INTRAVENOUS | Status: AC
  Administered 2017-11-13: 500 mg via INTRAVENOUS
  Filled 2017-11-13: qty 100

## 2017-11-13 MED ORDER — ACETAMINOPHEN 325 MG PO TABS
650.0000 mg | ORAL_TABLET | Freq: Four times a day (QID) | ORAL | Status: DC | PRN
  Administered 2017-11-14: 650 mg via ORAL
  Filled 2017-11-13: qty 2

## 2017-11-13 NOTE — ED Provider Notes (Signed)
Orders for meds placed by me per Dr. Randal Buba to expedite treatment.   Montine Circle, PA-C 11/13/17 Marciano Sequin, April, MD 11/13/17 [phone]

## 2017-11-13 NOTE — Discharge Instructions (Addendum)
It was a pleasure taking care of you at your recent visit at [place] were treated for a bacterial infection of your bowels, that was causing your diarrhea. We treated you with antibiotics and fluids.   Please take the antibiotic pill we send home with you, Bactrim, for 7 more days. Your last day of taking the antibiotic will be July 14th. Please take all of your pills, even if you start to feel better.

## 2017-11-13 NOTE — Plan of Care (Signed)
Offer ensure to give nutrition patient has poor appetite.

## 2017-11-13 NOTE — ED Provider Notes (Signed)
[facility]  SURGICAL Provider Note   CSN: [id number] Arrival date & time: 11/13/17  0025     History   Chief Complaint Chief Complaint  Patient presents with  . Abdominal Pain    HPI Ian Hill is a 67 y.o. male.  The history is provided by the patient and a relative.  Abdominal Pain   This is a new problem. The current episode started 3 to 5 hours ago. The problem occurs constantly. The problem has not changed since onset.The pain is associated with an unknown factor. The pain is located in the generalized abdominal region. The quality of the pain is cramping. The pain is at a severity of 5/10. The pain is moderate. Associated symptoms include diarrhea, nausea and vomiting. Pertinent negatives include anorexia, fever, constipation, dysuria and headaches. Nothing aggravates the symptoms. Nothing relieves the symptoms. Past workup does not include barium enema. His past medical history does not include PUD or irritable bowel syndrome.  Diarrhea   This is a new problem. The current episode started 3 to 5 hours ago. The problem occurs 2 to 4 times per day. The problem has not changed since onset.The stool consistency is described as watery. There has been no fever. Associated symptoms include abdominal pain and vomiting. Pertinent negatives include no headaches. He has tried nothing for the symptoms. The treatment provided no relief. His past medical history does not include irritable bowel syndrome.    Past Medical History:  Diagnosis Date  . Abnormal ECG   . BPH (benign prostatic hyperplasia)   . CKD (chronic kidney disease), stage II   . Diverticulosis   . Dyslipidemia   . Elevated PSA   . HTN (hypertension)   . Hypertension   . Hypertensive heart disease   . Hypertrophic cardiomegaly, apical variant   . Hypertrophic obstructive cardiomyopathy with diastolic heart failure ([place])   . Mitral regurgitation   . Prostate cancer ([place])   . Stroke  ([place]) 01/2017   x 2  . Urine retention     Patient Active Problem List   Diagnosis Date Noted  . Emesis 11/13/2017  . Hypertrophic cardiomegaly, apical variant   . Smoker 03/25/2017  . Chronic anticoagulation 03/25/2017  . Chronic atrial fibrillation ([place]) 03/25/2017  . Prostate cancer ([place]) 02/26/2017  . Cerebrovascular accident (CVA) due to embolic occlusion of right middle cerebral artery ([place]) 02/25/2017  . Left-sided neglect 01/30/2017  . Gait disturbance, post-stroke 01/30/2017  . Left hemiparesis ([place])   . PAF (paroxysmal atrial fibrillation) ([place])   . Benign prostatic hyperplasia   . Hyperlipidemia   . Thrombocytopenia ([place])   . Protein-calorie malnutrition, severe 01/24/2017  . Acute right arterial ischemic stroke, middle cerebral artery (MCA) ([place]) 01/23/2017  . Acute respiratory failure ([place]) 01/23/2017  . Essential hypertension 01/23/2017  . Acute metabolic encephalopathy 56/81/2751  . Endotracheal tube present   . Cerebrovascular accident (CVA) due to occlusion of right middle cerebral artery ([place]) - Right middle cerebral artery branch infarct do to right M1 occlusion treated with IV TPA and mechanical embolect   . Stage 3 chronic kidney disease ([place])   . Enlarged prostate with urinary retention 11/24/2016  . Lung nodule 09/16/2016  . Volume overload 09/10/2016  . Urinary retention 09/10/2016  . BPH (benign prostatic hyperplasia) 09/01/2016  . Periumbilical abdominal pain 09/01/2016  . Cerumen impaction 02/16/2016  . Decreased dorsalis pedis pulse 02/16/2016  . CKD (chronic kidney disease), stage II 02/20/2012  . Preventative health  care 06/26/2010  . Other symptoms involving cardiovascular system 11/19/2009  . Hypertrophic obstructive cardiomyopathy ([place]) 10/10/2009  . Hyperlipidemia 04/30/2006  . TOBACCO ABUSE 04/30/2006  . Essential hypertension 04/30/2006  . ELEVATED PROSTATE SPECIFIC ANTIGEN 04/30/2006  . DIVERTICULOSIS, COLON 12/18/2005    Past  Surgical History:  Procedure Laterality Date  . 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 CARDIOVASCULAR STRESS TEST  11-19-2016  dr Stanford Breed   Low risk nuclear study/  normal resting and stress perfusion with no ischemia or infarction/  EF not done due to PVCIs , TID borderline, 1.2 significant LVH  . IR PERCUTANEOUS ART THROMBECTOMY/INFUSION INTRACRANIAL INC DIAG ANGIO  01/23/2017  . RADIOLOGY WITH ANESTHESIA N/A 01/23/2017   Procedure: RADIOLOGY WITH ANESTHESIA;  Surgeon: Luanne Bras, MD;  Location: [facility];  Service: Radiology;  Laterality: N/A;  . TRANSTHORACIC ECHOCARDIOGRAM  11-20-2016   dr Stanford Breed   mild LVH,  apical hypertrophic cardiomyopathy, systolic function vigorous,  ef 65-70%,  grade 2 diastolic dysfunction/  mild AR, MR, and  PR/  moderate LAE and RAE/  mild to mod. TR/  moderate increase PASP 55mmHg  . TRANSURETHRAL RESECTION OF PROSTATE N/A 11/24/2016   Procedure: TRANSURETHRAL RESECTION OF THE PROSTATE (TURP);  Surgeon: Franchot Gallo, MD;  Location: [facility];  Service: Urology;  Laterality: N/A;        Home Medications    Prior to Admission medications   Medication Sig Start Date End Date Taking? Authorizing Provider  apixaban (ELIQUIS) 5 MG TABS tablet Take 1 tablet (5 mg total) by mouth 2 (two) times daily. 02/03/17   Angiulli, Lavon Paganini, PA-C  atenolol (TENORMIN) 50 MG tablet TAKE 1 TABLET (50 MG TOTAL) BY MOUTH EVERY MORNING. 06/30/17   [name], MD  feeding supplement, ENSURE ENLIVE, (ENSURE ENLIVE) LIQD Take 237 mLs by mouth 2 (two) times daily between meals. 02/25/17   Velna Ochs, MD  finasteride (PROSCAR) 5 MG tablet Take 1 tablet (5 mg total) by mouth daily. 02/03/17   Angiulli, Lavon Paganini, PA-C  hydrochlorothiazide (HYDRODIURIL) 12.5 MG tablet Take 1 tablet (12.5 mg total) by mouth daily. 05/08/17   [name], MD  polyethylene glycol (MIRALAX / Floria Raveling) packet Take 17 g by mouth daily.    [provider]  simvastatin (ZOCOR) 40 MG tablet TAKE 1 TABLET (40 MG TOTAL) BY MOUTH AT BEDTIME. 03/11/17   [name], MD  tamsulosin (FLOMAX) 0.4 MG CAPS capsule Take 1 capsule (0.4 mg total) by mouth daily. 02/03/17   Angiulli, Lavon Paganini, PA-C    Family History Family History  Problem Relation Age of Onset  . Cancer Paternal Aunt        unknown    Social History Social History   Tobacco Use  . Smoking status: Current Some Day Smoker    Packs/day: 0.50    Years: 51.00    Pack years: 25.50    Types: Cigarettes  . Smokeless tobacco: Never Used  Substance Use Topics  . Alcohol use: No    Frequency: Never  . Drug use: No     Allergies   Patient has no known allergies.   Review of Systems Review of Systems  Constitutional: Negative for diaphoresis and fever.  Eyes: Negative for visual disturbance.  Respiratory: Negative for wheezing.   Cardiovascular: Negative for chest pain, palpitations and leg swelling.  Gastrointestinal: Positive for abdominal pain, diarrhea, nausea and vomiting. Negative for anorexia and constipation.  Genitourinary: Negative for dysuria  and flank pain.  Musculoskeletal: Negative for back pain.  Skin: Negative for rash.  Neurological: Negative for speech difficulty and headaches.  Psychiatric/Behavioral: The patient is not nervous/anxious.   All other systems reviewed and are negative.    Physical Exam Updated Vital Signs BP 101/69 (BP Location: Left Arm)   Pulse 77   Temp 98.3 F (36.8 C) (Oral)   Resp 16   SpO2 100%   Physical Exam  Constitutional: [name] oriented to person, place, and time. He appears well-developed and well-nourished.  HENT:  Head: Normocephalic and atraumatic.  Mouth/Throat: No oropharyngeal exudate.  Eyes: Pupils are equal, round, and reactive to light. Conjunctivae are normal.  Neck: Normal range of motion. Neck supple. No JVD present.  Cardiovascular: Normal rate, regular rhythm, normal heart sounds and  intact distal pulses.  Pulmonary/Chest: No stridor. He has decreased breath sounds.  Abdominal: Bowel sounds are normal. He exhibits no mass. There is tenderness. There is no rebound. No hernia.  Musculoskeletal: He exhibits no edema.  Neurological: [name] alert and oriented to person, place, and time. He displays normal reflexes.  Skin: Skin is warm and dry. Capillary refill takes less than 2 seconds. [name] not diaphoretic.  Psychiatric: He has a normal mood and affect.     ED Treatments / Results  Labs (all labs ordered are listed, but only abnormal results are displayed) Results for orders placed or performed during the hospital encounter of 11/13/17  Lipase, blood  Result Value Ref Range   Lipase 49 11 - 51 U/L  Comprehensive metabolic panel  Result Value Ref Range   Sodium 140 135 - 145 mmol/L   Potassium 3.6 3.5 - 5.1 mmol/L   Chloride 103 98 - 111 mmol/L   CO2 29 22 - 32 mmol/L   Glucose, Bld 75 70 - 99 mg/dL   BUN 28 (H) 8 - 23 mg/dL   Creatinine, Ser 1.40 (H) 0.61 - 1.24 mg/dL   Calcium 9.4 8.9 - 10.3 mg/dL   Total Protein 6.6 6.5 - 8.1 g/dL   Albumin 3.7 3.5 - 5.0 g/dL   AST 34 15 - 41 U/L   ALT 26 0 - 44 U/L   Alkaline Phosphatase 107 38 - 126 U/L   Total Bilirubin 1.3 (H) 0.3 - 1.2 mg/dL   GFR calc non Af Amer 50 (L) >60 mL/min   GFR calc Af Amer 58 (L) >60 mL/min   Anion gap 8 5 - 15  CBC  Result Value Ref Range   WBC 2.2 (L) 4.0 - 10.5 K/uL   RBC 5.13 4.22 - 5.81 MIL/uL   Hemoglobin 14.7 13.0 - 17.0 g/dL   HCT 47.7 39.0 - 52.0 %   MCV 93.0 78.0 - 100.0 fL   MCH 28.7 26.0 - 34.0 pg   MCHC 30.8 30.0 - 36.0 g/dL   RDW 13.7 11.5 - 15.5 %   Platelets 135 (L) 150 - 400 K/uL  Differential  Result Value Ref Range   Neutrophils Relative % 89 %   Lymphocytes Relative 9 %   Monocytes Relative 1 %   Eosinophils Relative 1 %   Basophils Relative 0 %   Band Neutrophils 0 %   Metamyelocytes Relative 0 %   Myelocytes 0 %   Promyelocytes Relative 0 %   Blasts 0 %    nRBC 0 0 /100 WBC   Other 0 %   Neutro Abs 2.0 1.7 - 7.7 K/uL   Lymphs Abs 0.2 (L)  0.7 - 4.0 K/uL   Monocytes Absolute 0.0 (L) 0.1 - 1.0 K/uL   Eosinophils Absolute 0.0 0.0 - 0.7 K/uL   Basophils Absolute 0.0 0.0 - 0.1 K/uL   Smear Review MORPHOLOGY UNREMARKABLE   I-stat troponin, ED  Result Value Ref Range   Troponin i, poc 0.05 0.00 - 0.08 ng/mL   Comment 3          I-Stat CG4 Lactic Acid, ED  Result Value Ref Range   Lactic Acid, Venous 3.79 (HH) 0.5 - 1.9 mmol/L   Comment NOTIFIED PHYSICIAN   I-Stat CG4 Lactic Acid, ED  Result Value Ref Range   Lactic Acid, Venous 3.91 (HH) 0.5 - 1.9 mmol/L   Comment NOTIFIED PHYSICIAN    Dg Chest Portable 1 View  Result Date: 11/13/2017 CLINICAL DATA:  Possible free air EXAM: PORTABLE CHEST 1 VIEW COMPARISON:  01/26/2017, 01/25/2017 FINDINGS: Stable enlarged cardiomediastinal silhouette with left postsurgical changes. Scarring at the left lung base with slight elevation of the diaphragm. No acute airspace disease. No pneumothorax. Linear scarring at the right base. No definitive lucency beneath the imaged diaphragms. IMPRESSION: 1. No definitive lucency beneath the imaged right diaphragms to suggest free air 2. Postsurgical changes on the left with scarring at the bases 3. Stable cardiomegaly Electronically Signed   By: Donavan Foil M.D.   On: 11/13/2017 02:21   Ct Angio Chest/abd/pel For Dissection W And/or Wo Contrast  Result Date: 11/13/2017 CLINICAL DATA:  Sudden onset nausea, vomiting, and diarrhea with abdominal pain tonight. Hypertensive. EXAM: CT ANGIOGRAPHY CHEST, ABDOMEN AND PELVIS TECHNIQUE: Multidetector CT imaging through the chest, abdomen and pelvis was performed using the standard protocol during bolus administration of intravenous contrast. Multiplanar reconstructed images and MIPs were obtained and reviewed to evaluate the vascular anatomy. CONTRAST:  184mL ISOVUE-370 IOPAMIDOL (ISOVUE-370) INJECTION 76% COMPARISON:  CT abdomen and  pelvis 02/20/2017 FINDINGS: CTA CHEST FINDINGS Cardiovascular: Noncontrast CT images of the chest demonstrate scattered calcifications in the aorta. Calcification in the coronary arteries. Calcified lymph nodes in the left mediastinum and left hilum consistent with postinflammatory changes. Calcified granulomas in the left lung. No evidence of intramural hematoma. Images obtained during arterial phase after contrast administration demonstrate normal caliber thoracic aorta. No aneurysm or dissection. Great vessel origins are patent. Central pulmonary arteries are well opacified. No evidence of significant pulmonary embolus. Cardiac enlargement with particular enlargement of the right atrium. Reflux of contrast material into the hepatic veins suggest right heart failure. Mediastinum/Nodes: No significant lymphadenopathy in the chest. Upper esophagus is mildly dilated with debris in the esophagus. This may be due to reflux or dysmotility. Lungs/Pleura: Motion artifact limits examination. Patchy airspace infiltrates in both lung bases, greater on the left suggesting infectious or inflammatory process. Opacification of left lower lung bronchi. This could be due to mucous plugging or aspiration. No pleural effusions. No pneumothorax. Musculoskeletal: No chest wall abnormality. No acute or significant osseous findings. Coalition of left posterior ribs may be congenital or postoperative. Review of the MIP images confirms the above findings. CTA ABDOMEN AND PELVIS FINDINGS VASCULAR Aorta: Normal caliber abdominal aorta. Scattered calcification and noncalcified plaque formation. No dissection. Celiac: Patent without evidence of aneurysm, dissection, vasculitis or significant stenosis. SMA: Patent without evidence of aneurysm, dissection, vasculitis or significant stenosis. Renals: Both renal arteries are patent without evidence of aneurysm, dissection, vasculitis, fibromuscular dysplasia or significant stenosis. IMA: Patent  without evidence of aneurysm, dissection, vasculitis or significant stenosis. Inflow: There areas of calcific stenosis demonstrated in the right  common iliac and external iliac arteries with probable high-grade stenosis at the origin of the right femoral artery. Flow is demonstrated distal to the area of stenosis. Veins: No obvious venous abnormality within the limitations of this arterial phase study. Review of the MIP images confirms the above findings. NON-VASCULAR Examination is technically limited due to motion artifact, streak artifact, and lack of abdominal fat. Hepatobiliary: No focal liver abnormality is seen. No gallstones, gallbladder wall thickening, or biliary dilatation. Pancreas: Unremarkable. No pancreatic ductal dilatation or surrounding inflammatory changes. Spleen: Normal in size without focal abnormality. Adrenals/Urinary Tract: Adrenal glands are unremarkable. Kidneys are normal, without renal calculi, focal lesion, or hydronephrosis. Bladder is unremarkable. Stomach/Bowel: Stomach, small bowel, and colon are not abnormally distended. No wall thickening is appreciated. Appendix is not identified. Lymphatic: No significant lymphadenopathy. Reproductive: Prostate gland is enlarged. Other: No free air or free fluid in the abdomen. Abdominal wall musculature appears intact. Musculoskeletal: No acute or significant osseous findings. Review of the MIP images confirms the above findings. IMPRESSION: 1. No evidence of aneurysm or dissection involving the thoracic or abdominal aorta. 2. Aortic atherosclerosis. 3. Several areas of focal stenosis demonstrated in the right external iliac artery with high-grade stenosis at the origin of the right common femoral artery. 4. Patchy infiltrates in the lungs with opacification of left lower lobe bronchi. This may represent multifocal pneumonia or aspiration. 5. Retained debris in the upper esophagus without distention may be due to reflux or dysmotility.  Electronically Signed   By: Lucienne Capers M.D.   On: 11/13/2017 03:21    EKG EKG Interpretation  Date/Time:  Friday November 13 2017 02:36:21 EDT Ventricular Rate:  90 PR Interval:    QRS Duration: 106 QT Interval:  413 QTC Calculation: 506 R Axis:   58 Text Interpretation:  Atrial fibrillation RSR' in V1 or V2, right VCD or RVH LVH with secondary repolarization abnormality Prolonged QT interval Confirmed by Dory Horn) on 11/13/2017 3:04:43 AM   Radiology Dg Chest Portable 1 View  Result Date: 11/13/2017 CLINICAL DATA:  Possible free air EXAM: PORTABLE CHEST 1 VIEW COMPARISON:  01/26/2017, 01/25/2017 FINDINGS: Stable enlarged cardiomediastinal silhouette with left postsurgical changes. Scarring at the left lung base with slight elevation of the diaphragm. No acute airspace disease. No pneumothorax. Linear scarring at the right base. No definitive lucency beneath the imaged diaphragms. IMPRESSION: 1. No definitive lucency beneath the imaged right diaphragms to suggest free air 2. Postsurgical changes on the left with scarring at the bases 3. Stable cardiomegaly Electronically Signed   By: Donavan Foil M.D.   On: 11/13/2017 02:21   Ct Angio Chest/abd/pel For Dissection W And/or Wo Contrast  Result Date: 11/13/2017 CLINICAL DATA:  Sudden onset nausea, vomiting, and diarrhea with abdominal pain tonight. Hypertensive. EXAM: CT ANGIOGRAPHY CHEST, ABDOMEN AND PELVIS TECHNIQUE: Multidetector CT imaging through the chest, abdomen and pelvis was performed using the standard protocol during bolus administration of intravenous contrast. Multiplanar reconstructed images and MIPs were obtained and reviewed to evaluate the vascular anatomy. CONTRAST:  124mL ISOVUE-370 IOPAMIDOL (ISOVUE-370) INJECTION 76% COMPARISON:  CT abdomen and pelvis 02/20/2017 FINDINGS: CTA CHEST FINDINGS Cardiovascular: Noncontrast CT images of the chest demonstrate scattered calcifications in the aorta. Calcification in the  coronary arteries. Calcified lymph nodes in the left mediastinum and left hilum consistent with postinflammatory changes. Calcified granulomas in the left lung. No evidence of intramural hematoma. Images obtained during arterial phase after contrast administration demonstrate normal caliber thoracic aorta. No aneurysm or dissection. [employer]  vessel origins are patent. Central pulmonary arteries are well opacified. No evidence of significant pulmonary embolus. Cardiac enlargement with particular enlargement of the right atrium. Reflux of contrast material into the hepatic veins suggest right heart failure. Mediastinum/Nodes: No significant lymphadenopathy in the chest. Upper esophagus is mildly dilated with debris in the esophagus. This may be due to reflux or dysmotility. Lungs/Pleura: Motion artifact limits examination. Patchy airspace infiltrates in both lung bases, greater on the left suggesting infectious or inflammatory process. Opacification of left lower lung bronchi. This could be due to mucous plugging or aspiration. No pleural effusions. No pneumothorax. Musculoskeletal: No chest wall abnormality. No acute or significant osseous findings. Coalition of left posterior ribs may be congenital or postoperative. Review of the MIP images confirms the above findings. CTA ABDOMEN AND PELVIS FINDINGS VASCULAR Aorta: Normal caliber abdominal aorta. Scattered calcification and noncalcified plaque formation. No dissection. Celiac: Patent without evidence of aneurysm, dissection, vasculitis or significant stenosis. SMA: Patent without evidence of aneurysm, dissection, vasculitis or significant stenosis. Renals: Both renal arteries are patent without evidence of aneurysm, dissection, vasculitis, fibromuscular dysplasia or significant stenosis. IMA: Patent without evidence of aneurysm, dissection, vasculitis or significant stenosis. Inflow: There areas of calcific stenosis demonstrated in the right common iliac and external  iliac arteries with probable high-grade stenosis at the origin of the right femoral artery. Flow is demonstrated distal to the area of stenosis. Veins: No obvious venous abnormality within the limitations of this arterial phase study. Review of the MIP images confirms the above findings. NON-VASCULAR Examination is technically limited due to motion artifact, streak artifact, and lack of abdominal fat. Hepatobiliary: No focal liver abnormality is seen. No gallstones, gallbladder wall thickening, or biliary dilatation. Pancreas: Unremarkable. No pancreatic ductal dilatation or surrounding inflammatory changes. Spleen: Normal in size without focal abnormality. Adrenals/Urinary Tract: Adrenal glands are unremarkable. Kidneys are normal, without renal calculi, focal lesion, or hydronephrosis. Bladder is unremarkable. Stomach/Bowel: Stomach, small bowel, and colon are not abnormally distended. No wall thickening is appreciated. Appendix is not identified. Lymphatic: No significant lymphadenopathy. Reproductive: Prostate gland is enlarged. Other: No free air or free fluid in the abdomen. Abdominal wall musculature appears intact. Musculoskeletal: No acute or significant osseous findings. Review of the MIP images confirms the above findings. IMPRESSION: 1. No evidence of aneurysm or dissection involving the thoracic or abdominal aorta. 2. Aortic atherosclerosis. 3. Several areas of focal stenosis demonstrated in the right external iliac artery with high-grade stenosis at the origin of the right common femoral artery. 4. Patchy infiltrates in the lungs with opacification of left lower lobe bronchi. This may represent multifocal pneumonia or aspiration. 5. Retained debris in the upper esophagus without distention may be due to reflux or dysmotility. Electronically Signed   By: Lucienne Capers M.D.   On: 11/13/2017 03:21    Procedures Procedures (including critical care time)  Medications Ordered in ED Medications    fentaNYL (SUBLIMAZE) injection 50 mcg (50 mcg Intravenous Not Given 11/13/17 0409)  ondansetron (ZOFRAN) injection 4 mg (4 mg Intravenous Not Given 11/13/17 0409)  sodium chloride 0.9 % bolus 1,000 mL (1,000 mLs Intravenous Not Given 11/13/17 0502)    And  sodium chloride 0.9 % bolus 500 mL (500 mLs Intravenous Not Given 11/13/17 0503)    And  sodium chloride 0.9 % bolus 250 mL (0 mLs Intravenous Stopped 11/13/17 0515)  simvastatin (ZOCOR) tablet 40 mg (has no administration in time range)  apixaban (ELIQUIS) tablet 5 mg (has no administration in time range)  acetaminophen (TYLENOL) tablet 650 mg (has no administration in time range)    Or  acetaminophen (TYLENOL) suppository 650 mg (has no administration in time range)  ondansetron (ZOFRAN) tablet 4 mg (has no administration in time range)    Or  ondansetron (ZOFRAN) injection 4 mg (has no administration in time range)  sodium chloride 0.9 % bolus 500 mL (0 mLs Intravenous Stopped 11/13/17 0230)  0.9 %  sodium chloride infusion ( Intravenous New Bag/Given 11/13/17 0152)  sodium chloride 0.9 % bolus 1,000 mL (0 mLs Intravenous Stopped 11/13/17 0426)  iopamidol (ISOVUE-370) 76 % injection 100 mL (100 mLs Intravenous Contrast Given 11/13/17 0251)  levofloxacin (LEVAQUIN) IVPB 500 mg (500 mg Intravenous New Bag/Given 11/13/17 0456)      Final Clinical Impressions(s) / ED Diagnoses   Final diagnoses:  Community acquired pneumonia of left lower lobe of lung ([place])  Abnormal EKG  Sepsis due to other etiology ([place])  Nausea vomiting and diarrhea    Will admit to internal medicine residency    [place], Fumie Fiallo, MD 11/13/17 [phone]

## 2017-11-13 NOTE — ED Notes (Signed)
Attempted report x1. 

## 2017-11-13 NOTE — ED Notes (Signed)
dublicate blood cultures order.  Nurse will cancel.

## 2017-11-13 NOTE — H&P (Signed)
Date: 11/13/2017               Patient Name:  Ian Hill MRN: [id number]  DOB: July 15, 1950 Age / Sex: 67 y.o., male   PCP: Linda Hedges, NP         Medical Service: Internal Medicine Teaching Service         Attending Physician: Dr. Oval Linsey, MD    First Contact: Dr. Myrtie Hawk Pager: [phone]  Second Contact: Dr. Reesa Chew Pager: [phone]       After Hours (After 5p/  First Contact Pager: [phone]  weekends / holidays): Second Contact Pager: [phone]   Chief Complaint: Stomach pain  History of Present Illness: This is a 67 year old male with a history of prostate cancer S/P Turp, getting androgen deprivation shots, CVA in September 2018 (right middle cerebral artery, got tPA and had a thrombectomy), HTN, HLD, HCOM with diastolic heart failure, and atrial fibrillation presenting with a 1 day history of abdominal pain. He provided some of the information and the sister supplemented some. He states that the pain started yesterday, was located near where his ADT shots were given, pointing to his left and right lower quadrants, reports that it did not radiate any where, and was sharp in nature. He reports non-bloody, non-bilious vomiting x1 and diarrhea. He also reports some SOB that has been going on for about 1 week which he attributes to his smoking. He denies any fever, chest pain, diaphoresis, or cough. He denies any recent antibiotic use.   On arrival to the ED, he was hypertensive at 235/211, HR of 87, RR of 18. He was hypertensive on arrival but this quickly dropped after repeat readings. He reported that his abdominal pain radiated to his back at that time. His CXR showed stable cardiomegaly, and no free air. A CTA scan to check for dissection was done which showed no evidence of aneurysm or dissection, and patchy infiltrates in the lungs with left lower lobe bronchi which may represent multifocal pneumonia or aspiration. His EKG showed atrial fibrillation with some ST  depressions in v5 and v6. His labs showed a lactic acidosis of 3.79, leukopenia of 2.2, and a BUN of 28. He was given a bolus of NS and started on levofloxacin.   Meds:  No current facility-administered medications on file prior to encounter.    Current Outpatient Medications on File Prior to Encounter  Medication Sig Dispense Refill  . apixaban (ELIQUIS) 5 MG TABS tablet Take 1 tablet (5 mg total) by mouth 2 (two) times daily. 60 tablet 0  . atenolol (TENORMIN) 50 MG tablet TAKE 1 TABLET (50 MG TOTAL) BY MOUTH EVERY MORNING. 30 tablet 4  . feeding supplement, ENSURE ENLIVE, (ENSURE ENLIVE) LIQD Take 237 mLs by mouth 2 (two) times daily between meals. 237 mL 12  . finasteride (PROSCAR) 5 MG tablet Take 1 tablet (5 mg total) by mouth daily. 30 tablet 0  . hydrochlorothiazide (HYDRODIURIL) 12.5 MG tablet Take 1 tablet (12.5 mg total) by mouth daily. 90 tablet 2  . polyethylene glycol (MIRALAX / GLYCOLAX) packet Take 17 g by mouth daily.    . simvastatin (ZOCOR) 40 MG tablet TAKE 1 TABLET (40 MG TOTAL) BY MOUTH AT BEDTIME. 90 tablet 2  . tamsulosin (FLOMAX) 0.4 MG CAPS capsule Take 1 capsule (0.4 mg total) by mouth daily. 30 capsule 0     Allergies: Allergies as of 11/13/2017  . (No Known Allergies)   Past Medical History:  Diagnosis Date  . Abnormal ECG   . BPH (benign prostatic hyperplasia)   . CKD (chronic kidney disease), stage II   . Diverticulosis   . Dyslipidemia   . Elevated PSA   . HTN (hypertension)   . Hypertension   . Hypertensive heart disease   . Hypertrophic cardiomegaly, apical variant   . Hypertrophic obstructive cardiomyopathy with diastolic heart failure ([place])   . Mitral regurgitation   . Prostate cancer ([place])   . Stroke ([place]) 01/2017   x 2  . Urine retention     Family History: Denies any history of cancer.   Social History: Smokes 2 cigarettes per day, denies EtOH or other drug use. Lives at home with his sister.    Review of Systems: A complete ROS  was negative except as per HPI.   Physical Exam: Blood pressure (!) 80/67, pulse 69, temperature 98.3 F (36.8 C), temperature source Oral, resp. rate (!) 28, SpO2 100 %. General: cachectic and severe distress , appears stated age 84: PERRLA, no conjunctival pallor Heart: S1, S2 normal, no murmur, rub or gallop, normal rate, irregular rhythm Lungs: clear to auscultation and no wheezes or rales, some dyspnea Abdomen: Generalized moderate tenderness to palpation, no guarding, no scars, bowel sounds present Extremities: extremities normal, atraumatic, no cyanosis or edema Musculoskeletal: no joint tenderness, deformity or swelling  Neurology: PERLA, moves all extremities Psychiatry: normal mood and affect   EKG: personally reviewed my interpretation is atrial fibrillation with ST depressions in V5 and V6.   CXR: personally reviewed my interpretation is evidence of chronic cardiomegaly, signs of previous thoracic surgery, no bone fractures, clear costodiaphragmatic angles, normal lung fields.   Assessment & Plan by Problem:  This is a 67 year old male with a history of prostate cancer, CVA of right middle cerebral artery in September 2018, HTN, HLD, HCOM with diastolic heart failure, and atrial fibrillation presenting with a 1 day history of abdominal pain. This has been associated with nausea, 1  episode of non-bilious non-bloody emesis, and 3 episodes of diarrhea. He also reported some SOB for the past week. He denied any fevers or cough. He also denied any recent antibiotic use.  On arrival to the ED he was hypertensive at first, becoming hypotensive after subsequent checks, afebrile, and had a pulse of 69. He had generalized abdominal tenderness to palpation without guarding. He was found to have a lactic acid of 3.79 and leukopenia of 2.2. His CXR showed stable cardiomegaly, and no free air. A CTA scan showed no evidence of aneurysm or dissection, and patchy infiltrates in the lungs with  left lower lobe bronchi which may represent multifocal pneumonia or aspiration.   #Acute viral gastroenteritis: Patient has been having generalized abdominal pain, associated with nausea and vomiting x 1 day. [name] tender to palpation with no guarding. His labs showed an elevated lactic acid and leukopenia. No recent antibiotic use or hospitalizations. Given the sudden onset this is likely a viral gastroenteritis. Other possible causes include C. Diff, bacterial gastroenteritis, or food-bourne illness.  - Gave 1750 NS bolus, started on maintenance fluids at 125 cc/hr - Monitor vitals - Zofran 4 mg PRN - Blood culture x2 - Trend lactic acid - Repeat CBC   #HTN patient: Patient was hypertensive on arrival with subsequent BP becoming hypotensive. Taking atenolol 50 mg daily at home. - Will hold antihypertensives - Monitor vitals  #Renal dysfunction: unclear baseline, BUN 28 and Cr 1.4 on arrival. Previously CKD stage 2.  -  IV fluids - Monitor labs  #Possible aspiration: evidence of aspiration seen on CT in the setting of emesis. No clinical signs or symptoms of pneumonia other than some dyspnea.  - Monitor for now, low threshold for starting Unasyn if febrile - Given 1 dose of levaquin in ED  #Prostate cancer: Adenocarcinoma s/p TURP. Currently getting ADT injections last receiving on 6/25.  - [facility]btain med rec from [facility] today  #Atrial fibrillation (chronic): EKG showed currently in atrial fibrillation but rate controlled with ventricular rate in the 90s.  - Holding bb due to hypotension - Continue elequis  #CVA: Right MCA in Sept 2018 requiring tPA and a medical throbectomy. Appears to have minimal residual deficits.  - Continue simvastatin - Continue eliquis  #HCOM - Holding BB, volume resuscitating.   FEN: 125cc/hr, replete lytes prn, red diet as tolerated VTE ppx: Eliquis Code Status: FULL    Dispo: Admit patient to Observation with expected length of stay less than 2  midnights.  Signed: Asencion Noble, MD 11/13/2017, 5:06 AM  Pager: [phone]

## 2017-11-13 NOTE — Progress Notes (Signed)
   Subjective:  Pt this morning reports he feels "100% better" and is back to his baseline. He denies abdominal pain, nausea, and vomiting since being in the hospital. He reports he has not had diarrhea since being here, however per night sign out he had diarrhea x2 since arrival. Pt has not tried to eat, but has tolerated water. [name] excited to drink Ensure, since that is what he often drinks at home. Pt states he ate collard greens without meat yesterday and believes that may be the culprit, however his sister also ate the collards and did not get sick.  Objective:  Vital signs in last 24 hours: Vitals:   11/13/17 0430 11/13/17 0500 11/13/17 0510 11/13/17 0544  BP: (!) 80/67 93/63 94/72  101/69  Pulse: 69  (!) 31 77  Resp: (!) 28 14 (!) 24 16  Temp:    98.3 F (36.8 C)  TempSrc:    Oral  SpO2: 100%  98% 100%  Weight:    55.3 kg (122 lb)  Height:    6' (1.829 m)   Gen: cachectic, NAD, non-toxic Cardio: RRR, S1, split S2, no murmurs, 2+ equal radial pulses Resp: LCAB, no increased WOB Abdominal: soft, non-tender to palpation throughout, no rebound, no guarding, no CVA tenderness +BS Extremities: warm, no pitting edema Pysch: cooperative, pleasant, congruent affect  Assessment/Plan:  Mr. Fridman is a 67yo male with PMHx significant for HTN, HCOM, hx embolic R MCA CVA 2/2 Afib on Eliquis, CKD II, and prostate CA s/p TURP on ADT injections who presented with abdominal pain, nausea, vomiting, and diarrhea with hypotension, leukocytosis, and lactic acidosis most likely secondary to viral gastroenteritis resulting in hypovolemia, hypotension and Type A lactic acidosis.    #nasuea, vomiting, diarrhea -working diagnosis viral gastroenteritis resulting in hypovolemia -DDx: Bacterial gastroenteritis is possible, especially considering pts leukocytosis with left shift. Pts sister also eat what the patient ate yesterday and did not get sick, making this less likely. Mesenteric ischemia is less  likely considering pts benign abdominal exam and overall non-toxic clinical appearance. CT without abscess or pneumoperitoneum, making abdominal / renal infection or bowel rupture unlikely. Plan: -continue to trend lactic acid, expect downtrend with fluid resuscitation, if uptrending we will consider alternative diagnoses -supportive care with IV fluids, Zofran -po trial with Ensure  #Paroxysmal Afib Plan: -continue Eliquis 5mg  BID  #Hypertrophic Cardiomyopathy -beta blocker held d/t hypotension in the ED Plan:  -restart atenolol as BP improves  #CKD II -Cr 1.4 which is within pts baseline 1.2-1.6   Dispo: Estimated inpatient hospital stay <24 hrs for gastroenteritis resulting in hypovolemia.  Coffer, Earnest Conroy, Medical Student 11/13/2017, 11:27 AM  Pager: [phone]  Attestation for Student Documentation:  I personally was present and performed or re-performed the history, physical exam and medical decision-making activities of this service and have verified that the service and findings are accurately documented in the student's note.  Lorella Nimrod, MD 11/13/2017, 3:13 PM

## 2017-11-13 NOTE — Discharge Summary (Signed)
Name: Ian Hill MRN: [id number] DOB: 1951/01/30 67 y.o. PCP: Linda Hedges, NP  Date of Admission: 11/13/2017 12:26 AM Date of Discharge: 11/15/17 Attending Physician: No att. providers found  Discharge Diagnosis: 1. Bacterial Gastroenteritis 2. Bronchiectasis without complication  Discharge Medications: Allergies as of 11/15/2017   No Known Allergies     Medication List    TAKE these medications   apixaban 5 MG Tabs tablet Commonly known as:  ELIQUIS Take 1 tablet (5 mg total) by mouth 2 (two) times daily.   atenolol 50 MG tablet Commonly known as:  TENORMIN TAKE 1 TABLET (50 MG TOTAL) BY MOUTH EVERY MORNING.   feeding supplement (ENSURE ENLIVE) Liqd Take 237 mLs by mouth 2 (two) times daily between meals. What changed:  Another medication with the same name was added. Make sure you understand how and when to take each.   feeding supplement (ENSURE ENLIVE) Liqd Take 237 mLs by mouth 3 (three) times daily. What changed:  You were already taking a medication with the same name, and this prescription was added. Make sure you understand how and when to take each.   finasteride 5 MG tablet Commonly known as:  PROSCAR Take 1 tablet (5 mg total) by mouth daily.   hydrochlorothiazide 12.5 MG tablet Commonly known as:  HYDRODIURIL Take 1 tablet (12.5 mg total) by mouth daily. What changed:  how much to take   polyethylene glycol packet Commonly known as:  MIRALAX / GLYCOLAX Take 17 g by mouth daily as needed. What changed:    when to take this  reasons to take this   simvastatin 40 MG tablet Commonly known as:  ZOCOR TAKE 1 TABLET (40 MG TOTAL) BY MOUTH AT BEDTIME.   tamsulosin 0.4 MG Caps capsule Commonly known as:  FLOMAX Take 1 capsule (0.4 mg total) by mouth daily.   Vitamin D 2000 units Caps Take 2,000 Units by mouth daily.     ASK your doctor about these medications   sulfamethoxazole-trimethoprim 800-160 MG tablet Commonly known as:  BACTRIM  DS,SEPTRA DS Take 1 tablet by mouth 2 (two) times daily for 7 days. Ask about: Should I take this medication?       Disposition and follow-up:   [name] was discharged from [facility] in stable condition.  At the hospital follow up visit please address:  1.  Please ensure patient completes his 7 day course of Bactrim.  2.  Labs / imaging needed at time of follow-up: NA  3.  Pending labs/ test needing follow-up: Please follow up Quantiferon gold due to high suspicion of latent TB. If positive, we recommend considering treatment with 67mo course of Isoniazid since patient is at high risk of reactivation d/t age and malnutrition. If negative, please follow up with PPD x 2, 10 days apart to rule out potential false negative Quantiferon Gold.   Follow-up Appointments: Follow-up [employer], [place]. Schedule an appointment as soon as possible for a visit in 1 week(s).   Why:  Please call to make an appointment to see your doctor at [facility] within the next week.  Contact information: [place]  [address] [place] [address] by problem list: 1. Bacterial Gastroenteritis Pt presented with abdominal pain, nausea, vomiting, and diarrhea with borderline hypotension, leukocytosis, and lactic acidosis initially thought to be d/t viral gastroenteritis, however pts blood cultures grew  E. Coli and he was subsequently placed on ceftriaxone. Pt received supportive treatment with fluids and symptom management. Pt remains afebrile, normotensive, has downtrending leukocytosis, lactate WNL, and is tolerating po upon discharge.  2. 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 Pt does not recall any hisotry of TB and denies any previous treatment. We are screening for latent TB because pt is at higher  risk of reactivation compared to general population d/t older age and malnutrition.    Discharge Vitals:   BP (!) 128/93 (BP Location: Left Arm)   Pulse 79   Temp 97.9 F (36.6 C) (Oral)   Resp 18   Ht 6' (1.829 m)   Wt 122 lb (55.3 kg)   SpO2 100%   BMI 16.55 kg/m   Pertinent Labs, Studies, and Procedures:  Lactate 3.7 --> 3.9 --> 1.7 WBC 19.3 --> 12.7 Cr 1.37 - within limits of pts baseline 1.2-1.6  Micro: 7/5 blood cx positive - E. Coli resistant to Ampicillin and ciprofloxacin   CTA Chest / Abdomen / Pelvis: no pneumoperitoneum, no dissection, aortic atherosclerosis, high grade stenosis of R common femoral artery with perfusion distal to stenosis. Lungs with patchy lung bilateral infiltrates greatest in LLL, significant for bronchiectasis.  Discharge Instructions: Please take your antibiotic pills for 7 days. Your last day of taking your antibiotic will be July 14. Please finish your bottle, even if you start to feel better. Discharge Instructions    Diet - low sodium heart healthy   Complete by:  As directed    Discharge instructions   Complete by:  As directed    It was pleasure taking care of you. Please take your antibiotics called Bactrim, twice a day every day for the next 7 days. Keep yourself well-hydrated. Please make an appointment with your primary care doctor to be seen within next few days for your follow-up appointment.   Increase activity slowly   Complete by:  As directed     Please make sure you are eating and drinking plenty of food and water to stay hydrated.   SignedLorella Nimrod, MD 11/24/2017, 6:17 PM   Pager: [phone]

## 2017-11-13 NOTE — Progress Notes (Signed)
PHARMACY - PHYSICIAN COMMUNICATION CRITICAL VALUE ALERT - BLOOD CULTURE IDENTIFICATION (BCID)  Ian Hill is an 67 y.o. male who presented to [facility] on 11/13/2017 with a chief complaint of sudden onset of nausea, vomiting and diarrhea.  Assessment: BCID revealed E.coli bacteremia.  Received one dose of Levaquin earlier today.  Afebrile, WBC 13 and LA came down to 3.  Name of physician (or Provider) Contacted: IMTS  Current antibiotics:  None  Changes to prescribed antibiotics recommended:  Recommendations accepted by provider >> start Rocephin 2gm IV Q24H  Results for orders placed or performed during the hospital encounter of 11/13/17  Blood Culture ID Panel (Reflexed) (Collected: 11/13/2017  4:22 AM)  Result Value Ref Range   Enterococcus species NOT DETECTED NOT DETECTED   Listeria monocytogenes NOT DETECTED NOT DETECTED   Staphylococcus species NOT DETECTED NOT DETECTED   Staphylococcus aureus NOT DETECTED NOT DETECTED   Streptococcus species NOT DETECTED NOT DETECTED   Streptococcus agalactiae NOT DETECTED NOT DETECTED   Streptococcus pneumoniae NOT DETECTED NOT DETECTED   Streptococcus pyogenes NOT DETECTED NOT DETECTED   Acinetobacter baumannii NOT DETECTED NOT DETECTED   Enterobacteriaceae species DETECTED (A) NOT DETECTED   Enterobacter cloacae complex NOT DETECTED NOT DETECTED   Escherichia coli DETECTED (A) NOT DETECTED   Klebsiella oxytoca NOT DETECTED NOT DETECTED   Klebsiella pneumoniae NOT DETECTED NOT DETECTED   Proteus species NOT DETECTED NOT DETECTED   Serratia marcescens NOT DETECTED NOT DETECTED   Carbapenem resistance NOT DETECTED NOT DETECTED   Haemophilus influenzae NOT DETECTED NOT DETECTED   Neisseria meningitidis NOT DETECTED NOT DETECTED   Pseudomonas aeruginosa NOT DETECTED NOT DETECTED   Candida albicans NOT DETECTED NOT DETECTED   Candida glabrata NOT DETECTED NOT DETECTED   Candida krusei NOT DETECTED NOT DETECTED   Candida  parapsilosis NOT DETECTED NOT DETECTED   Candida tropicalis NOT DETECTED NOT DETECTED     Terrell Shimko D. Mina Marble, PharmD, BCPS, [place] 11/13/2017, 8:02 PM

## 2017-11-13 NOTE — ED Notes (Signed)
Patient transported to CT 

## 2017-11-13 NOTE — ED Triage Notes (Addendum)
Pt states sudden onset nausea, vomiting, diarrhea with abdominal pain at 2300 tonight.  Was feeling fine until then. Abdominal pain radiates to back. Pt is extremely hypertensive in triage. Compliant with all BP meds per spouse.  Supposed to begin radiation for prostate CA in august.

## 2017-11-13 NOTE — ED Notes (Signed)
Patient given sip of water. Tolerating well. Denies abdominal pain at this time.

## 2017-11-14 DIAGNOSIS — I480 Paroxysmal atrial fibrillation: Secondary | ICD-10-CM

## 2017-11-14 DIAGNOSIS — C61 Malignant neoplasm of prostate: Secondary | ICD-10-CM | POA: Diagnosis present

## 2017-11-14 DIAGNOSIS — Z7901 Long term (current) use of anticoagulants: Secondary | ICD-10-CM | POA: Diagnosis not present

## 2017-11-14 DIAGNOSIS — I130 Hypertensive heart and chronic kidney disease with heart failure and stage 1 through stage 4 chronic kidney disease, or unspecified chronic kidney disease: Secondary | ICD-10-CM | POA: Diagnosis present

## 2017-11-14 DIAGNOSIS — Z9079 Acquired absence of other genital organ(s): Secondary | ICD-10-CM | POA: Diagnosis not present

## 2017-11-14 DIAGNOSIS — R9431 Abnormal electrocardiogram [ECG] [EKG]: Secondary | ICD-10-CM | POA: Diagnosis present

## 2017-11-14 DIAGNOSIS — E861 Hypovolemia: Secondary | ICD-10-CM | POA: Diagnosis present

## 2017-11-14 DIAGNOSIS — J479 Bronchiectasis, uncomplicated: Secondary | ICD-10-CM | POA: Diagnosis present

## 2017-11-14 DIAGNOSIS — N400 Enlarged prostate without lower urinary tract symptoms: Secondary | ICD-10-CM | POA: Diagnosis present

## 2017-11-14 DIAGNOSIS — I422 Other hypertrophic cardiomyopathy: Secondary | ICD-10-CM | POA: Diagnosis not present

## 2017-11-14 DIAGNOSIS — N182 Chronic kidney disease, stage 2 (mild): Secondary | ICD-10-CM

## 2017-11-14 DIAGNOSIS — Z8673 Personal history of transient ischemic attack (TIA), and cerebral infarction without residual deficits: Secondary | ICD-10-CM | POA: Diagnosis not present

## 2017-11-14 DIAGNOSIS — A044 Other intestinal Escherichia coli infections: Secondary | ICD-10-CM

## 2017-11-14 DIAGNOSIS — Z902 Acquired absence of lung [part of]: Secondary | ICD-10-CM | POA: Diagnosis not present

## 2017-11-14 DIAGNOSIS — I421 Obstructive hypertrophic cardiomyopathy: Secondary | ICD-10-CM | POA: Diagnosis present

## 2017-11-14 DIAGNOSIS — I5032 Chronic diastolic (congestive) heart failure: Secondary | ICD-10-CM | POA: Diagnosis present

## 2017-11-14 DIAGNOSIS — Z8611 Personal history of tuberculosis: Secondary | ICD-10-CM | POA: Diagnosis not present

## 2017-11-14 DIAGNOSIS — I959 Hypotension, unspecified: Secondary | ICD-10-CM | POA: Diagnosis present

## 2017-11-14 DIAGNOSIS — E872 Acidosis: Secondary | ICD-10-CM | POA: Diagnosis present

## 2017-11-14 DIAGNOSIS — F17210 Nicotine dependence, cigarettes, uncomplicated: Secondary | ICD-10-CM | POA: Diagnosis present

## 2017-11-14 DIAGNOSIS — B9620 Unspecified Escherichia coli [E. coli] as the cause of diseases classified elsewhere: Secondary | ICD-10-CM

## 2017-11-14 DIAGNOSIS — I340 Nonrheumatic mitral (valve) insufficiency: Secondary | ICD-10-CM | POA: Diagnosis present

## 2017-11-14 DIAGNOSIS — R7881 Bacteremia: Secondary | ICD-10-CM

## 2017-11-14 DIAGNOSIS — E785 Hyperlipidemia, unspecified: Secondary | ICD-10-CM | POA: Diagnosis present

## 2017-11-14 LAB — BASIC METABOLIC PANEL
ANION GAP: 7 (ref 5–15)
BUN: 32 mg/dL — ABNORMAL HIGH (ref 8–23)
CALCIUM: 8.8 mg/dL — AB (ref 8.9–10.3)
CO2: 27 mmol/L (ref 22–32)
Chloride: 105 mmol/L (ref 98–111)
Creatinine, Ser: 1.37 mg/dL — ABNORMAL HIGH (ref 0.61–1.24)
GFR, EST AFRICAN AMERICAN: 60 mL/min — AB (ref >60)
GFR, EST NON AFRICAN AMERICAN: 51 mL/min — AB (ref >60)
Glucose, Bld: 67 mg/dL — ABNORMAL LOW (ref 70–99)
Potassium: 4.1 mmol/L (ref 3.5–5.1)
SODIUM: 139 mmol/L (ref 135–145)

## 2017-11-14 LAB — CBC
HCT: 37 % — ABNORMAL LOW (ref 39.0–52.0)
Hemoglobin: 11.7 g/dL — ABNORMAL LOW (ref 13.0–17.0)
MCH: 28.8 pg (ref 26.0–34.0)
MCHC: 31.6 g/dL (ref 30.0–36.0)
MCV: 91.1 fL (ref 78.0–100.0)
Platelets: 107 10*3/uL — ABNORMAL LOW (ref 150–400)
RBC: 4.06 MIL/uL — ABNORMAL LOW (ref 4.22–5.81)
RDW: 14.1 % (ref 11.5–15.5)
WBC: 19.3 10*3/uL — ABNORMAL HIGH (ref 4.0–10.5)

## 2017-11-14 MED ORDER — ATENOLOL 50 MG PO TABS
50.0000 mg | ORAL_TABLET | Freq: Every day | ORAL | Status: DC
  Administered 2017-11-14 – 2017-11-15 (×2): 50 mg via ORAL
  Filled 2017-11-14 (×2): qty 1

## 2017-11-14 MED ORDER — TAMSULOSIN HCL 0.4 MG PO CAPS
0.4000 mg | ORAL_CAPSULE | Freq: Every day | ORAL | Status: DC
  Administered 2017-11-14 – 2017-11-15 (×2): 0.4 mg via ORAL
  Filled 2017-11-14 (×2): qty 1

## 2017-11-14 NOTE — Progress Notes (Signed)
   Subjective: Patient was feeling better when seen this morning.  He has no new complaints.  Denies any more diarrhea, nausea or vomiting.  Remained afebrile.  Objective:  Vital signs in last 24 hours: Vitals:   11/13/17 2121 11/14/17 0532 11/14/17 0613 11/14/17 1312  BP: 101/72 115/75 115/71 123/81  Pulse: 85 93 81 94  Resp: 20  16 16   Temp: 97.7 F (36.5 C)  98.3 F (36.8 C) (!) 97.1 F (36.2 C)  TempSrc: Oral  Oral Oral  SpO2: 99%  100% 100%  Weight:      Height:       General.  Well-developed, cachectic appearing gentleman, in no acute distress. Lungs.  Normal work of breathing, clear bilaterally. CV.  Regular rate and rhythm. Abdomen.  Soft, nontender, bowel sounds positive. Extremities.  No edema, no cyanosis, pulses intact and symmetrical.  Assessment/Plan:  67 year old gentleman came with symptoms of gastroenteritis, found to have E. coli bacteremia.  E. coli bacteremia.  E. coli grew in both aerobic and anaerobic bottles drawn from right hand.  Both bottles from left hand with no growth. Patient remained afebrile.  Developed leukocytosis. -Continue ceftriaxone-will be switched to p.o. antibiotic after sensitivity report.   Gastroenteritis.  Symptoms resolved.  Paroxysmal Afib.  His home dose of atenolol was held because of softer blood pressure initially.  Normotensive today.  Heart rate in 90s. -Restart home dose of atenolol 50 mg daily. -Continue Eliquis.  Hypertrophic Cardiomyopathy.  Remained asymptomatic. -Restart atenolol.  CKD II. creatinine 1.37, appears at baseline.  Dispo: Anticipated discharge in approximately 1 day(s).   Lorella Nimrod, MD 11/14/2017, 2:39 PM Pager: [phone]

## 2017-11-14 NOTE — Progress Notes (Signed)
md on call paged patient had 5 beats of Vtach asymptomatic bp 116/76 Hr 93. Arthor Captain LPN

## 2017-11-14 NOTE — Plan of Care (Signed)
  Problem: Education: Goal: Knowledge of General Education information will improve Outcome: Progressing   Problem: Clinical Measurements: Goal: Ability to maintain clinical measurements within normal limits will improve Outcome: Progressing   

## 2017-11-14 NOTE — Progress Notes (Signed)
Initial Nutrition Assessment  DOCUMENTATION CODES:   Underweight, Severe malnutrition in context of chronic illness  INTERVENTION:   - Continue Ensure Enlive po TID, each supplement provides 350 kcal and 20 grams of protein  NUTRITION DIAGNOSIS:   Severe Malnutrition related to chronic illness, cancer and cancer related treatments(prostate cancer currently on androgen deprivation therapy, CHF) as evidenced by severe muscle depletion, moderate muscle depletion, severe fat depletion, moderate fat depletion.  GOAL:   Patient will meet greater than or equal to 90% of their needs  MONITOR:   PO intake, Supplement acceptance, Labs, Weight trends  REASON FOR ASSESSMENT:   Malnutrition Screening Tool    ASSESSMENT:   67 year old pt who presented to the ED with abdominal pain, diarrhea, nausea, and vomiting. 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  Discussed pt with RN.  Pt was able to provide some diet history but did not provide details. No family at bedside so unable to obtain a detailed diet and weight history.  RD obtained bed weight at time of visit: 123.9 lbs. Pt with 2 extra blankets on bed. Pt's weight is likely less than bed weight obtained. Per weight history in chart, pt has maintained his weight between 117-121 lbs since 02/25/17. Noted pt with weight of 135 lbs on 09/10/17. Suspect gradual weight loss; however, not significant for timeframe. When asked about weight loss, pt states "I've been sick." Pt reports his UBW as 160 lbs but is unsure when he last weighed this.  Pt states that he does not have an appetite and that this "just started." Pt states he ate "good this morning" and had bacon, eggs, sausage, and toast. Pt  reports that he typically eats 2 meals and drinks 2 Ensure daily at home. Ensure Enlive TID ordered. Will continue. Encouraged pt to increase to 3 Ensure daily after discharge. Pt states he will try.  Pt reports that a typical breakfast includes sausage, toast, and orange juice. Pt reports that a typical lunch includes a peanut butter and jelly sandwich and an Ensure.  Meal Completion: 90% today  Medications reviewed and include: Ensure Enlive TID  Labs reviewed: BUN 32 (H), creatinine 1.37 (H), hemoglobin 11.7 (L), HCT 37.0 (L)  NUTRITION - FOCUSED PHYSICAL EXAM:    Most Recent Value  Orbital Region  Severe depletion  Upper Arm Region  Severe depletion  Thoracic and Lumbar Region  Severe depletion  Buccal Region  Moderate depletion  Temple Region  Moderate depletion  Clavicle Bone Region  Severe depletion  Clavicle and Acromion Bone Region  Severe depletion  Scapular Bone Region  Unable to assess  Dorsal Hand  Moderate depletion  Patellar Region  Severe depletion  Anterior Thigh Region  Severe depletion  Posterior Calf Region  Severe depletion  Edema (RD Assessment)  None  Hair  Reviewed  Eyes  Reviewed  Mouth  Reviewed  Skin  Reviewed  Nails  Reviewed       Diet Order:   Diet Order           Diet regular Room service appropriate? Yes; Fluid consistency: Thin  Diet effective now          EDUCATION NEEDS:   No education needs have been identified at this time  Skin:  Skin Assessment: Reviewed RN Assessment  Last BM:  11/12/17  Height:   Ht Readings from Last 1 Encounters:  11/13/17 6' (1.829 m)    Weight:   Wt Readings from Last 1 Encounters:  11/13/17 122 lb (55.3 kg)    Ideal Body Weight:  80.91 kg  BMI:  Body mass index is 16.55 kg/m.  Estimated Nutritional Needs:   Kcal:  1650-1850 kcal/day  Protein:  70-85 grams/day  Fluid:  1.7 L/day    Gaynell Face, MS, RD, LDN Pager: [phone] Weekend/After Hours: [phone]

## 2017-11-14 NOTE — Progress Notes (Signed)
Internal Medicine Attending  Date: 11/14/2017  Patient name: [name] Medical record number: [id number] Date of birth: 15-Nov-1950 Age: 67 y.o. Gender: male  I saw and evaluated the patient. I reviewed the resident's note by Dr. Reesa Chew and I agree with the resident's findings and plans as documented in her progress note.  When seen on rounds this morning [name] continued to feel well. He denied any nausea, vomiting, or diarrhea. Interestingly, the blood cultures from his right hand grew Escherichia coli yesterday evening. The cause of his Escherichia coli bacteremia is unclear but likely a GI source given his presenting symptoms. I am not sure if it was related to some kind of food poisoning associated with his collard green meal. We are waiting the sensitivities for the Escherichia coli before converting to an oral regimen. Our plan is to treat for a total of 10 days of antibiotics to cover his Escherichia coli bacteremia. I anticipate, assuming the sensitivities are back, that he will be stable for discharge home tomorrow.

## 2017-11-15 LAB — CULTURE, BLOOD (ROUTINE X 2)
SPECIAL REQUESTS: ADEQUATE
Special Requests: ADEQUATE

## 2017-11-15 LAB — CBC
HCT: 39 % (ref 39.0–52.0)
Hemoglobin: 12.7 g/dL — ABNORMAL LOW (ref 13.0–17.0)
MCH: 29.3 pg (ref 26.0–34.0)
MCHC: 32.6 g/dL (ref 30.0–36.0)
MCV: 89.9 fL (ref 78.0–100.0)
PLATELETS: 116 10*3/uL — AB (ref 150–400)
RBC: 4.34 MIL/uL (ref 4.22–5.81)
RDW: 13.8 % (ref 11.5–15.5)
WBC: 12.8 10*3/uL — ABNORMAL HIGH (ref 4.0–10.5)

## 2017-11-15 MED ORDER — POLYETHYLENE GLYCOL 3350 17 G PO PACK: 17.0000 g | PACK | Freq: Every day | ORAL | 0 refills | Status: AC | PRN

## 2017-11-15 MED ORDER — ENSURE ENLIVE PO LIQD: 1.0000 | Freq: Three times a day (TID) | ORAL | 12 refills | Status: AC

## 2017-11-15 MED ORDER — LACTATED RINGERS IV SOLN
INTRAVENOUS | Status: AC
  Administered 2017-11-15: 09:00:00 via INTRAVENOUS

## 2017-11-15 MED ORDER — SODIUM CHLORIDE 0.9 % IV SOLN
2.0000 g | INTRAVENOUS | Status: DC
  Administered 2017-11-15: 17:00:00 via INTRAVENOUS
  Filled 2017-11-15 (×2): qty 20

## 2017-11-15 MED ORDER — SULFAMETHOXAZOLE-TRIMETHOPRIM 800-160 MG PO TABS: 1.0000 | ORAL_TABLET | Freq: Two times a day (BID) | ORAL | 0 refills | Status: AC

## 2017-11-15 NOTE — Progress Notes (Signed)
   Subjective: Pt endorses watery diarrhea overnight, unable to quantify. Denies hematochezia / melena. He endorses anorexia this morning, but had previously been able to tolerate Ensure and solid foods including hot dog and french fries yesterday. He denies abdominal pain, nausea, vomiting. Endorses possible fever and chills. Denies CP, SOB, cough.  Objective:  Vital signs in last 24 hours: Vitals:   11/14/17 0613 11/14/17 1312 11/14/17 2053 11/15/17 0537  BP: 115/71 123/81 119/71 120/81  Pulse: 81 94 95 72  Resp: 16 16 16 16   Temp: 98.3 F (36.8 C) (!) 97.1 F (36.2 C) 98 F (36.7 C) 98.9 F (37.2 C)  TempSrc: Oral Oral Oral Oral  SpO2: 100% 100% 100% 100%  Weight:      Height:       Physical Exam: Gen: cachectic, NAD, pleasant Cardio: RRR, S1, split S2 increased with inspiration, no murmur, 2+ radial and dorsalis pedis pulses bilaterally Resp: LCAB, no increased WOB Abd: soft, no rebound, no guarding, non-tender, +BS Neuro: A&O x 3, EOMI, normal speech  Micro:  -7/5 blood cx: E. Coli resistant to Ampicillin and ciprofloxacin   Assessment/Plan: [name] is a 67yo male with PMHx significant for HTN, HCOM, hx embolic R MCA CVA 2/2 Afib on Eliquis, CKD II, and prostate CA s/p TURP on ADT injections who presented with abdominal pain, nausea, vomiting, and diarrhea with hypotension, leukocytosis, and lactic acidosis thought to be d/t viral gastroenteritis, now with +E. Coli blood cultures.  #nasuea, vomiting, diarrhea -working diagnosis bacterial gastroenteritis with initial SIRs response -today - lactate WNL, normotensive, afebrile -positive E. Coli bacteremia -CT abd 7/5 negative for acute intraabdominal process Plan: -continue ceftriaxone day 3/10 -switch to po Cefazolin or Bactrim upon discharge to complete a total of 10 day course -1L LR over 4 hrs this morning before discharge -continue po as tolerated  #E.coli bacteremia -DDx: most likely related to  gastroenteritis and possible food poisoning, less likely UTI with negative UA on arrival, respiratory source is unlikely with CXR on arrival negative for infiltrate and no clinical resp sxs. Plan: -continue abx as above for presumed bacterial gastroenteritis   #Paroxysmal Afib Plan: -continue Eliquis 5mg  BID  #Hypertrophic Cardiomyopathy -continue atenolol   #CKD II -Cr 1.37, within pts baseline (1.2-1.6)  #Bronchiectasis without complication -hx of aspergilloma resection, possible hx TB - unclear if pt has ever received prophylaxis for latent TB Plan: -Quant Gold pending -if positive, will treat with 46mo Isoniazid since pt is at increased risk of reactivation d/t older age and malnutrition -if negative, recommend placing PPD x 2, 10 days apart to be sure to exclude a false negative quant gold   Dispo: Estimated inpatient hospital stay <24 hrs for gastroenteritis resulting in hypovolemia.   Ian Hill, Earnest Conroy, Medical Student 11/15/2017, 7:37 AM  Pager [phone]

## 2017-11-15 NOTE — Progress Notes (Signed)
Ian Hill to be D/C'd  per MD order. Discussed with the patient and all questions fully answered.  VSS, Skin clean, dry and intact without evidence of skin break down, no evidence of skin tears noted.  IV catheter discontinued intact. Site without signs and symptoms of complications. Dressing and pressure applied.  An After Visit Summary was printed and given to the patient. Patient received prescription.  D/c education completed with patient/family including follow up instructions, medication list, d/c activities limitations if indicated, with other d/c instructions as indicated by MD - patient able to verbalize understanding, all questions fully answered.   Patient instructed to return to ED, call 911, or call MD for any changes in condition.   Patient to be escorted via [place], and D/C home via private auto.

## 2017-11-18 LAB — CULTURE, BLOOD (ROUTINE X 2): CULTURE: NO GROWTH

## 2017-11-18 LAB — QUANTIFERON-TB GOLD PLUS (RQFGPL)
QUANTIFERON TB2 AG VALUE: 0.01 [IU]/mL
QuantiFERON Mitogen Value: 0.08 IU/mL
QuantiFERON Nil Value: 0.01 IU/mL
QuantiFERON TB1 Ag Value: 0.01 IU/mL

## 2017-11-18 LAB — QUANTIFERON-TB GOLD PLUS: QuantiFERON-TB Gold Plus: UNDETERMINED

## 2017-12-09 ENCOUNTER — Other Ambulatory Visit: Payer: Self-pay | Admitting: Urology

## 2017-12-09 DIAGNOSIS — C61 Malignant neoplasm of prostate: Secondary | ICD-10-CM

## 2018-01-14 ENCOUNTER — Ambulatory Visit: Payer: Medicare (Managed Care) | Admitting: Radiation Oncology

## 2018-01-16 IMAGING — NM NM BONE WHOLE BODY
2 series · 2 of 2 positions shown · non-contrast
Comparison: [HOSPITAL] CT Abdomen and Pelvis 7731
hours today.

CLINICAL DATA: 65-year-old male with prostate cancer.

EXAM:
NUCLEAR MEDICINE WHOLE BODY BONE SCAN
TECHNIQUE: Whole body anterior and posterior images were obtained approximately
3 hours after intravenous injection of radiopharmaceutical.
RADIOPHARMACEUTICALS:  20.2 mCi Jechnetium-GGm MDP IV

[Series 1: whole body · 2.66mm/px · 1 of 1 slices shown (1 of 2)]
[im 1/1]
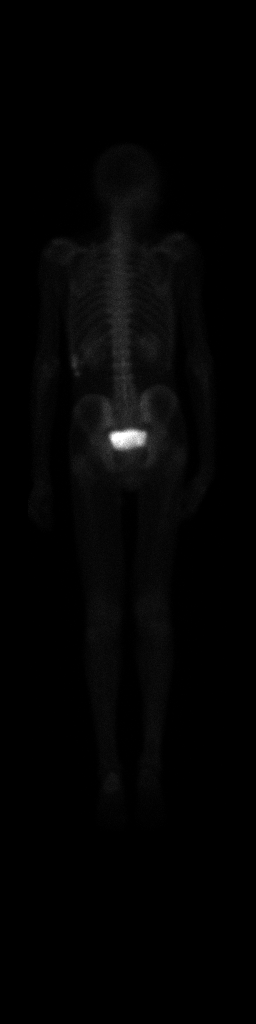

[Series 1: whole body · 2.66mm/px · 1 of 1 slices shown (2 of 2)]
[im 1/1]
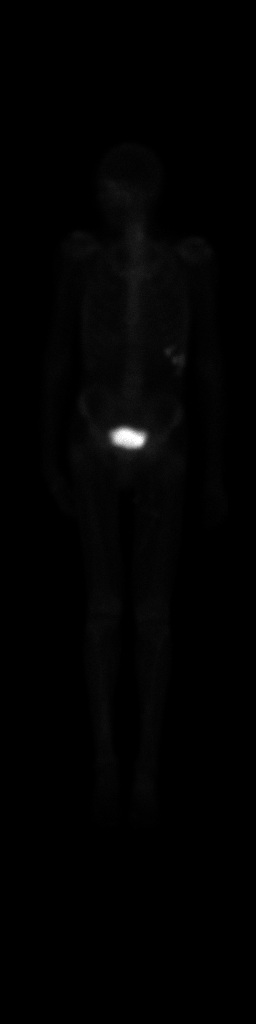

[2 of 2 positions shown; findings below may reference images not displayed]

FINDINGS: Expected radiotracer activity in both kidneys in the urinary
bladder.

Moderate to intense increased radiotracer activity along the left
anterolateral lower ribs.

Healing fractures of the left anterolateral seventh, eighth, ninth,
and tenth ribs are demonstrated on the CT comparison today and
appear benign (posttraumatic).

Otherwise homogeneous radiotracer activity throughout the axial
skeleton, including the skull.

Asymmetric increased radiotracer activity in the left posterior foot
or calcaneus appears degenerative in nature. Subtle asymmetric
increased activity at the right wrist, also appears degenerative.
Otherwise homogeneous radiotracer activity throughout the
appendicular skeleton.
IMPRESSION: 1.  No findings specific for metastatic disease to bone.
2. Healing benign left lower rib fractures.
3. Mild degenerative related activity in the appendicular skeleton.

## 2018-01-19 ENCOUNTER — Telehealth: Payer: Self-pay | Admitting: Radiation Oncology

## 2018-01-20 NOTE — Telephone Encounter (Signed)
I called and spoke with Ms. Owens Shark, the patient's sister and she reports that her brother was unable to have fiducial markers and spaceoar in the office and has to have this done in the OR. This is in the process of being scheduled. I have called and left a message for Dr. Alan Ripper scheduler to find out when this will take place.

## 2018-02-10 ENCOUNTER — Other Ambulatory Visit: Payer: Self-pay | Admitting: Urology

## 2018-03-04 ENCOUNTER — Telehealth: Payer: Self-pay | Admitting: Physician Assistant

## 2018-03-04 NOTE — Telephone Encounter (Signed)
New Message      [facility] Pre-operative Risk Assessment    Request for surgical clearance:  1. What type of surgery is being performed? Gold seed markers placed in prostate to start radiation   2. When is this surgery scheduled? 03/10/2018  3. What type of clearance is required (medical clearance vs. Pharmacy clearance to hold med vs. Both)? both  4. Are there any medications that need to be held prior to surgery and how long? eliquis 48 hrs prior to surgery  5. Practice name and name of physician performing surgery? [facility]/ Dr. Remo Lipps dahlstedt  6. What is your office phone number [phone] ext [phone]   7.   What is your office fax number [phone]  8.   Anesthesia type (None, local, MAC, general) ? General   Nicholes Stairs 03/04/2018, 12:08 PM  _________________________________________________________________   (provider comments below)

## 2018-03-04 NOTE — Telephone Encounter (Signed)
Patient with diagnosis of atrial fibrillation on Eliquis for anticoagulation.    Procedure: gold seed marker placement for prostate radiation Date of procedure: 03/10/18  CHADS2-VASc score of  5 (CHF, HTN, AGE, , stroke/tia x 2,)  CrCl 39  Platelet count 116  Per office protocol will need to be reviewed by primary cardiologist due to patient low body weight (< 55 kg). Patient has history of stroke (Sept 2018).   [facility] is asking to hold Eliquis 48 hrs prior to procedure, scheduled for 03/10/18

## 2018-03-04 NOTE — Progress Notes (Signed)
Spoke with Dr. Linna Caprice anesthesiologist for [facility] review. Patient with history of hypertrophic cardiomyopathy and afib/flutter. Patient has not been seen by his cardiologist since 02/2017. Dr. Linna Caprice would like for patient to be seen by cardiologist for clearance before his procedure next Wednesday.left message with [place] surgery scheduler with [facility]. Awaiting return phone call.

## 2018-03-04 NOTE — Telephone Encounter (Signed)
   Primary Cardiologist: Kirk Ruths, MD  Chart reviewed as part of pre-operative protocol coverage. Patient was contacted 03/04/2018 in reference to pre-operative risk assessment for pending surgery as outlined below.  Ian Hill was last seen on 06/2017 by Rosaria Ferries. H/o HCM with NSVt on Holter but pt/family declined ICD, normal nuc 11/2016, mild carotid dz 2011, stroke, PAF, CKD II, HTN, HLD, BPH. Revised cardiac risk index calculated at 0.9% indicating low risk of cardiac complications. Previously cleared for TURP in 2018 by Dr. Stanford Breed after stable cardiac testing. Spoke with patient on the phone along with his sister who is listed on DPR. They both feel he has not had any interim change in cardiac status with new CP, SOB or syncope. Therefore, based on ACC/AHA guidelines, the patient would be at acceptable risk for the planned procedure without further cardiovascular testing.   We are asked if patient can hold Eliquis for 48 hours. Dr. Stanford Breed has reviewed chart and states "Hold apixaban 2 days prior to procedure and resume after when ok with urology." I relayed this to his sister who says she thinks her form says 5 days so she will be calling Dr. Alan Ripper office to check final operation instructions. As above, we would recommend holding only 2 days.  I will route this recommendation to the requesting party via Epic fax function and remove from pre-op pool.  Please call with questions.  Charlie Pitter, PA-C 03/04/2018, 3:47 PM

## 2018-03-04 NOTE — Telephone Encounter (Signed)
Hold apixaban 2 days prior to procedure and resume after when ok with urology Kirk Ruths

## 2018-03-05 ENCOUNTER — Encounter (HOSPITAL_BASED_OUTPATIENT_CLINIC_OR_DEPARTMENT_OTHER): Payer: Self-pay | Admitting: *Deleted

## 2018-03-05 ENCOUNTER — Other Ambulatory Visit: Payer: Self-pay

## 2018-03-05 NOTE — Progress Notes (Addendum)
SPOKE WITH JULIA BROWN SISTER PER PATIENT REQUEST NPO AFTER MIDNIGHT ARRIVE 630 AM [place] [facility] TO TAKE SIP OF WATER: ATENOLOL, FINASTERIDE, TUASULOSIN RECORDS ON CHART/EPIC: CARDIAC CLEARANCE NOTE DAYNA DUNN PA [id number], EKG 11-13-17 ECHO 01-24-17,  DRIVER SISTER JULIA BROWN CELL [phone] NEEDS I STAT 8 SURGERY ORDERS IN EPIC

## 2018-03-10 ENCOUNTER — Ambulatory Visit (HOSPITAL_BASED_OUTPATIENT_CLINIC_OR_DEPARTMENT_OTHER)
Admission: RE | Admit: 2018-03-10 | Discharge: 2018-03-10 | Disposition: A | Discharge status: Home / Self Care | Payer: Medicare (Managed Care) | Source: Ambulatory Visit | Attending: Urology | Admitting: Urology

## 2018-03-10 ENCOUNTER — Other Ambulatory Visit: Payer: Self-pay

## 2018-03-10 ENCOUNTER — Encounter (HOSPITAL_BASED_OUTPATIENT_CLINIC_OR_DEPARTMENT_OTHER): Payer: Self-pay

## 2018-03-10 ENCOUNTER — Encounter (HOSPITAL_BASED_OUTPATIENT_CLINIC_OR_DEPARTMENT_OTHER)
Admission: RE | Disposition: A | Discharge status: Home / Self Care | Payer: Self-pay | Source: Ambulatory Visit | Attending: Urology

## 2018-03-10 ENCOUNTER — Ambulatory Visit (HOSPITAL_BASED_OUTPATIENT_CLINIC_OR_DEPARTMENT_OTHER): Payer: Medicare (Managed Care) | Admitting: Anesthesiology

## 2018-03-10 ENCOUNTER — Telehealth: Payer: Self-pay | Admitting: *Deleted

## 2018-03-10 DIAGNOSIS — C61 Malignant neoplasm of prostate: Secondary | ICD-10-CM | POA: Diagnosis not present

## 2018-03-10 DIAGNOSIS — R972 Elevated prostate specific antigen [PSA]: Secondary | ICD-10-CM | POA: Diagnosis not present

## 2018-03-10 DIAGNOSIS — N182 Chronic kidney disease, stage 2 (mild): Secondary | ICD-10-CM | POA: Diagnosis not present

## 2018-03-10 DIAGNOSIS — Z8673 Personal history of transient ischemic attack (TIA), and cerebral infarction without residual deficits: Secondary | ICD-10-CM | POA: Insufficient documentation

## 2018-03-10 DIAGNOSIS — N401 Enlarged prostate with lower urinary tract symptoms: Secondary | ICD-10-CM | POA: Insufficient documentation

## 2018-03-10 DIAGNOSIS — I5032 Chronic diastolic (congestive) heart failure: Secondary | ICD-10-CM | POA: Insufficient documentation

## 2018-03-10 DIAGNOSIS — E785 Hyperlipidemia, unspecified: Secondary | ICD-10-CM | POA: Diagnosis not present

## 2018-03-10 DIAGNOSIS — I130 Hypertensive heart and chronic kidney disease with heart failure and stage 1 through stage 4 chronic kidney disease, or unspecified chronic kidney disease: Secondary | ICD-10-CM | POA: Insufficient documentation

## 2018-03-10 DIAGNOSIS — R339 Retention of urine, unspecified: Secondary | ICD-10-CM | POA: Insufficient documentation

## 2018-03-10 DIAGNOSIS — Z79899 Other long term (current) drug therapy: Secondary | ICD-10-CM | POA: Diagnosis not present

## 2018-03-10 DIAGNOSIS — F17210 Nicotine dependence, cigarettes, uncomplicated: Secondary | ICD-10-CM | POA: Insufficient documentation

## 2018-03-10 DIAGNOSIS — Z8546 Personal history of malignant neoplasm of prostate: Secondary | ICD-10-CM

## 2018-03-10 HISTORY — PX: SPACE OAR INSTILLATION: SHX6769

## 2018-03-10 HISTORY — PX: GOLD SEED IMPLANT: SHX6343

## 2018-03-10 LAB — POCT I-STAT, CHEM 8
BUN: 25 mg/dL — ABNORMAL HIGH (ref 8–23)
CALCIUM ION: 1.31 mmol/L (ref 1.15–1.40)
Chloride: 102 mmol/L (ref 98–111)
Creatinine, Ser: 1.1 mg/dL (ref 0.61–1.24)
Glucose, Bld: 96 mg/dL (ref 70–99)
HCT: 47 % (ref 39.0–52.0)
Hemoglobin: 16 g/dL (ref 13.0–17.0)
Potassium: 4 mmol/L (ref 3.5–5.1)
SODIUM: 141 mmol/L (ref 135–145)
TCO2: 29 mmol/L (ref 22–32)

## 2018-03-10 SURGERY — INSERTION, GOLD SEEDS
Anesthesia: General | Site: Prostate

## 2018-03-10 MED ORDER — PROPOFOL 10 MG/ML IV BOLUS
INTRAVENOUS | Status: DC | PRN
  Administered 2018-03-10: 110 mg via INTRAVENOUS
  Administered 2018-03-10: 40 mg via INTRAVENOUS

## 2018-03-10 MED ORDER — DEXAMETHASONE SODIUM PHOSPHATE 10 MG/ML IJ SOLN
INTRAMUSCULAR | Status: DC | PRN
  Administered 2018-03-10: 10 mg via INTRAVENOUS

## 2018-03-10 MED ORDER — CEFAZOLIN SODIUM-DEXTROSE 2-4 GM/100ML-% IV SOLN
INTRAVENOUS | Status: AC
  Filled 2018-03-10: qty 100

## 2018-03-10 MED ORDER — ONDANSETRON HCL 4 MG/2ML IJ SOLN
4.0000 mg | Freq: Once | INTRAMUSCULAR | Status: DC | PRN
  Filled 2018-03-10: qty 2

## 2018-03-10 MED ORDER — FENTANYL CITRATE (PF) 100 MCG/2ML IJ SOLN
INTRAMUSCULAR | Status: AC
  Filled 2018-03-10: qty 2

## 2018-03-10 MED ORDER — OXYCODONE HCL 5 MG PO TABS
5.0000 mg | ORAL_TABLET | Freq: Once | ORAL | Status: DC | PRN
  Filled 2018-03-10: qty 1

## 2018-03-10 MED ORDER — MEPERIDINE HCL 25 MG/ML IJ SOLN
6.2500 mg | INTRAMUSCULAR | Status: DC | PRN
  Filled 2018-03-10: qty 1

## 2018-03-10 MED ORDER — SODIUM CHLORIDE 0.9 % IJ SOLN
INTRAMUSCULAR | Status: DC | PRN
  Administered 2018-03-10: 10 mL

## 2018-03-10 MED ORDER — LIDOCAINE 2% (20 MG/ML) 5 ML SYRINGE
INTRAMUSCULAR | Status: AC
  Filled 2018-03-10: qty 5

## 2018-03-10 MED ORDER — ACETAMINOPHEN 325 MG PO TABS
325.0000 mg | ORAL_TABLET | ORAL | Status: DC | PRN
  Filled 2018-03-10: qty 2

## 2018-03-10 MED ORDER — OXYCODONE HCL 5 MG/5ML PO SOLN
5.0000 mg | Freq: Once | ORAL | Status: DC | PRN
  Filled 2018-03-10: qty 5

## 2018-03-10 MED ORDER — DEXAMETHASONE SODIUM PHOSPHATE 10 MG/ML IJ SOLN
INTRAMUSCULAR | Status: AC
  Filled 2018-03-10: qty 1

## 2018-03-10 MED ORDER — LIDOCAINE 2% (20 MG/ML) 5 ML SYRINGE
INTRAMUSCULAR | Status: DC | PRN
  Administered 2018-03-10: 70 mg via INTRAVENOUS

## 2018-03-10 MED ORDER — ATROPINE SULFATE 0.4 MG/ML IJ SOLN
INTRAMUSCULAR | Status: AC
  Filled 2018-03-10: qty 1

## 2018-03-10 MED ORDER — ONDANSETRON HCL 4 MG/2ML IJ SOLN
INTRAMUSCULAR | Status: AC
  Filled 2018-03-10: qty 2

## 2018-03-10 MED ORDER — CEFAZOLIN SODIUM-DEXTROSE 1-4 GM/50ML-% IV SOLN
1.0000 g | INTRAVENOUS | Status: AC
  Administered 2018-03-10: 1 g via INTRAVENOUS
  Filled 2018-03-10: qty 50

## 2018-03-10 MED ORDER — PROPOFOL 10 MG/ML IV BOLUS
INTRAVENOUS | Status: AC
  Filled 2018-03-10: qty 20

## 2018-03-10 MED ORDER — FENTANYL CITRATE (PF) 100 MCG/2ML IJ SOLN
25.0000 ug | INTRAMUSCULAR | Status: DC | PRN
  Filled 2018-03-10: qty 1

## 2018-03-10 MED ORDER — KETOROLAC TROMETHAMINE 15 MG/ML IJ SOLN
15.0000 mg | Freq: Once | INTRAMUSCULAR | Status: DC
  Filled 2018-03-10: qty 1

## 2018-03-10 MED ORDER — MIDAZOLAM HCL 2 MG/2ML IJ SOLN
INTRAMUSCULAR | Status: AC
  Filled 2018-03-10: qty 2

## 2018-03-10 MED ORDER — LACTATED RINGERS IV SOLN
INTRAVENOUS | Status: DC
  Administered 2018-03-10: 1000 mL via INTRAVENOUS
  Filled 2018-03-10: qty 1000

## 2018-03-10 MED ORDER — ONDANSETRON HCL 4 MG/2ML IJ SOLN
INTRAMUSCULAR | Status: DC | PRN
  Administered 2018-03-10: 4 mg via INTRAVENOUS

## 2018-03-10 MED ORDER — ACETAMINOPHEN 160 MG/5ML PO SOLN
325.0000 mg | ORAL | Status: DC | PRN
  Filled 2018-03-10: qty 20.3

## 2018-03-10 SURGICAL SUPPLY — 13 items
DRSG TEGADERM 4X4.75 (GAUZE/BANDAGES/DRESSINGS) ×3 IMPLANT
DRSG TEGADERM 8X12 (GAUZE/BANDAGES/DRESSINGS) ×6 IMPLANT
GAUZE SPONGE 4X4 12PLY STRL LF (GAUZE/BANDAGES/DRESSINGS) ×3 IMPLANT
GLOVE BIO SURGEON STRL SZ8 (GLOVE) ×3 IMPLANT
GLOVE ECLIPSE 8.0 STRL XLNG CF (GLOVE) ×3 IMPLANT
GOWN STRL REUS W/TWL XL LVL3 (GOWN DISPOSABLE) ×3 IMPLANT
IMPL SPACEOAR SYSTEM 10ML (Spacer) ×3 IMPLANT
IMPLANT SPACEOAR SYSTEM 10ML (Spacer) ×9 IMPLANT
KIT TURNOVER CYSTO (KITS) ×3 IMPLANT
MARKER GOLD PRELOAD 1.2X3 (Urological Implant) ×3 IMPLANT
PACK CYSTO (CUSTOM PROCEDURE TRAY) ×3 IMPLANT
SEED GOLD PRELOAD 1.2X3 (Urological Implant) ×9 IMPLANT
UNDERPAD 30X30 (UNDERPADS AND DIAPERS) ×6 IMPLANT

## 2018-03-10 NOTE — Anesthesia Procedure Notes (Signed)
Procedure Name: LMA Insertion Date/Time: 03/10/2018 8:59 AM Performed by: Wanita Chamberlain, CRNA Pre-anesthesia Checklist: Patient identified, Timeout performed, Emergency Drugs available, Suction available and Patient being monitored Patient Re-evaluated:Patient Re-evaluated prior to induction Oxygen Delivery Method: Circle system utilized Preoxygenation: Pre-oxygenation with 100% oxygen Induction Type: IV induction Ventilation: Mask ventilation without difficulty LMA: LMA inserted LMA Size: 4.0 Number of attempts: 1 Placement Confirmation: CO2 detector,  positive ETCO2 and breath sounds checked- equal and bilateral Tube secured with: Tape Dental Injury: Teeth and Oropharynx as per pre-operative assessment

## 2018-03-10 NOTE — H&P (Signed)
H&P  Chief Complaint: Prostate cancer  History of Present Illness:  67 year old male with high-risk prostate cancer, having initiated androgen deprivation therapy, presents at this time for ultrasound placement of fiducial markers as well as placement of Space OAR.  This was unsuccessfully attempted in the office.  Past Medical History:  Diagnosis Date  . Abnormal ECG   . BPH (benign prostatic hyperplasia)   . CKD (chronic kidney disease), stage II   . Diverticulosis   . Dyslipidemia   . Elevated PSA   . HTN (hypertension)   . Hypertension   . Hypertensive heart disease   . Hypertrophic cardiomegaly, apical variant   . Hypertrophic obstructive cardiomyopathy with diastolic heart failure ([place])   . Mitral regurgitation   . Prostate cancer ([place]) DX 2019  . Stroke ([place]) 01/2017   X1  . Urine retention     Past Surgical History:  Procedure Laterality Date  . 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 CARDIOVASCULAR STRESS TEST  11-19-2016  dr Stanford Breed   Low risk nuclear study/  normal resting and stress perfusion with no ischemia or infarction/  EF not done due to PVCIs , TID borderline, 1.2 significant LVH  . IR PERCUTANEOUS ART THROMBECTOMY/INFUSION INTRACRANIAL INC DIAG ANGIO  01/23/2017  . RADIOLOGY WITH ANESTHESIA N/A 01/23/2017   Procedure: RADIOLOGY WITH ANESTHESIA;  Surgeon: Luanne Bras, MD;  Location: [facility];  Service: Radiology;  Laterality: N/A;  . TRANSTHORACIC ECHOCARDIOGRAM  11-20-2016   dr Stanford Breed   mild LVH,  apical hypertrophic cardiomyopathy, systolic function vigorous,  ef 65-70%,  grade 2 diastolic dysfunction/  mild AR, MR, and  PR/  moderate LAE and RAE/  mild to mod. TR/  moderate increase PASP 62mmHg  . TRANSURETHRAL RESECTION OF PROSTATE N/A 11/24/2016   Procedure: TRANSURETHRAL RESECTION OF THE PROSTATE (TURP);  Surgeon: Franchot Gallo, MD;  Location: [facility];  Service: Urology;  Laterality:  N/A;    Home Medications:    Allergies: No Known Allergies  Family History  Problem Relation Age of Onset  . Cancer Paternal Aunt        unknown    Social History:  reports that he has been smoking cigarettes. He has a 25.50 pack-year smoking history. He has never used smokeless tobacco. He reports that he does not drink alcohol or use drugs.  ROS: A complete review of systems was performed.  All systems are negative except for pertinent findings as noted.  Physical Exam:  Vital signs in last 24 hours: Temp:  [97.5 F (36.4 C)] 97.5 F (36.4 C) (10/30 0555) Pulse Rate:  [57] 57 (10/30 0555) Resp:  [16] 16 (10/30 0555) BP: (133)/(80) 133/80 (10/30 0555) SpO2:  [99 %] 99 % (10/30 0555) Weight:  [56.1 kg] 56.1 kg (10/30 0555) Constitutional:  Alert and oriented, No acute distress Cardiovascular: Regular rate  Respiratory: Normal respiratory effort GI: Abdomen is soft, nontender, nondistended, no abdominal masses Genitourinary: No CVAT. Normal male phallus, testes are descended bilaterally and non-tender and without masses, scrotum is normal in appearance without lesions or masses, perineum is normal on inspection. Lymphatic: No lymphadenopathy Neurologic: Grossly intact, no focal deficits Psychiatric: Normal mood and affect  Laboratory Data:  Recent Labs    03/10/18 0744  HGB 16.0  HCT 47.0    Recent Labs    03/10/18 0744  NA 141  K 4.0  CL 102  GLUCOSE 96  BUN 25*  CREATININE  1.10     Results for orders placed or performed during the hospital encounter of 03/10/18 (from the past 24 hour(s))  I-STAT, chem 8     Status: Abnormal   Collection Time: 03/10/18  7:44 AM  Result Value Ref Range   Sodium 141 135 - 145 mmol/L   Potassium 4.0 3.5 - 5.1 mmol/L   Chloride 102 98 - 111 mmol/L   BUN 25 (H) 8 - 23 mg/dL   Creatinine, Ser 1.10 0.61 - 1.24 mg/dL   Glucose, Bld 96 70 - 99 mg/dL   Calcium, Ion 1.31 1.15 - 1.40 mmol/L   TCO2 29 22 - 32 mmol/L    Hemoglobin 16.0 13.0 - 17.0 g/dL   HCT 47.0 39.0 - 52.0 %   No results found for this or any previous visit (from the past 240 hour(s)).  Renal Function: Recent Labs    03/10/18 0744  CREATININE 1.10   Estimated Creatinine Clearance: 51 mL/min (by C-G formula based on SCr of 1.1 mg/dL).  Radiologic Imaging: No results found.  Impression/Assessment:    High-risk adenocarcinoma of the prostate  Plan:  placement of fiducial markers and Space OAR

## 2018-03-10 NOTE — Anesthesia Preprocedure Evaluation (Addendum)
Anesthesia Evaluation  Patient identified by MRN, date of birth, ID band Patient awake    Reviewed: Allergy & Precautions, NPO status , Patient's Chart, lab work & pertinent test results, Unable to perform ROS - Chart review onlyPreop documentation limited or incomplete due to emergent nature of procedure.  Airway Mallampati: II  TM Distance: >3 FB Neck ROM: Limited   Comment: C collar Dental  (+) Missing, Edentulous Upper, Edentulous Lower   Pulmonary Current Smoker,    Pulmonary exam normal breath sounds clear to auscultation       Cardiovascular hypertension, Pt. on medications and Pt. on home beta blockers + dysrhythmias Atrial Fibrillation  Rhythm:Regular Rate:Normal  Admission ekg with st depression/twave inversion and few PVCs   Neuro/Psych Acute MCA s/p TPA with left hemiparesis  CVA, Residual Symptoms negative psych ROS   GI/Hepatic negative GI ROS,   Endo/Other  negative endocrine ROS  Renal/GU Acute vs chronic renal failure, Cr 1.5-1.65 on admission     Musculoskeletal negative musculoskeletal ROS (+)   Abdominal   Peds  Hematology negative hematology ROS (+)   Anesthesia Other Findings Ian Hill  ECHO COMPLETE WO IMAGING ENHANCING AGENT  Order# [id number]  Reading physician: Sanda Klein, MD Ordering physician: Aroor, Lanice Schwab, MD Study date: 01/24/17 Study Result   Result status: Final result                             *[facility]*                         [address], [place] [address]                            [phone]  ------------------------------------------------------------------- Transthoracic Echocardiography  Patient:    Ian Hill, Ian Hill MR #:       [id number] Study Date: 01/24/2017 Gender:     M Age:        67 Height:     177.8 cm Weight:     42.3 kg BSA:        1.42 m^2 Pt.  Status: Room:       4N32C   SONOGRAPHER  Johny Chess, RDCS, CCT  PERFORMING   [facility], Inpatient  ATTENDING    Tegeler, Evelena Asa  ADMITTING    Aroor, Karena Addison R  ORDERING     Aroor, Karena Addison R  cc:  ------------------------------------------------------------------- LV EF: 55% -   60%  ------------------------------------------------------------------- Indications:      CVA 11.  ------------------------------------------------------------------- History:   Risk factors:  Chronic kidney disease. Hypertension.  ------------------------------------------------------------------- Study Conclusions  - Left ventricle: The cavity size was normal. There was hypertrophy   of the apex. The appearance was consistent with apical variant   hypertrophic cardiomyopathy. Systolic function was normal. The   estimated ejection fraction was in the range of 55% to 60%. There   was no dynamic obstruction. Wall motion was normal; there were no   regional wall motion abnormalities. Features are consistent with   a pseudonormal left ventricular filling pattern, with concomitant   abnormal relaxation and increased filling pressure (  grade 2   diastolic dysfunction). - Aortic valve: There was mild regurgitation directed centrally in   the LVOT. - Mitral valve: There was mild to moderate regurgitation directed   eccentrically and posteriorly. - Left atrium: The atrium was mildly dilated. - Right atrium: The atrium was mildly dilated. - Tricuspid valve: There was mild-moderate regurgitation directed   centrally. - Pulmonary arteries: Systolic pressure was mildly increased. PA   peak pressure: 36 mm Hg (S).  ------------------------------------------------------------------- Study data:  No prior study was available for comparison.  Study status:  Routine.  Procedure:  The patient was unable to respond to pain level query due to mechanical ventilation. The patient would not grade their  pain level when asked, but did not appear in distress pre or post test. Transthoracic echocardiography. Image quality was fair.  Study completion:  There were no complications.         Transthoracic echocardiography.  M-mode, complete 2D, spectral Doppler, and color Doppler.  Birthdate:  Patient birthdate: 1950/11/26.  Age:  Patient is 67 yr old.  Sex:  Gender: male.    BMI: 13.4 kg/m^2.  Blood pressure:     103/64  Patient status:  Inpatient.  Study date:  Study date: 01/24/2017. Study time: 03:49 PM.  Location:  Bedside.  -------------------------------------------------------------------  ------------------------------------------------------------------- Left ventricle:  The cavity size was normal. There was hypertrophy of the apex. The appearance was consistent with apical variant hypertrophic cardiomyopathy. Systolic function was normal. The estimated ejection fraction was in the range of 55% to 60%. There was no dynamic obstruction. Wall motion was normal; there were no regional wall motion abnormalities. Features are consistent with a pseudonormal left ventricular filling pattern, with concomitant abnormal relaxation and increased filling pressure (grade 2 diastolic dysfunction).  ------------------------------------------------------------------- Aortic valve:   Trileaflet; normal thickness leaflets. Mobility was not restricted.  Doppler:  Transvalvular velocity was within the normal range. There was no stenosis. There was mild regurgitation directed centrally in the LVOT.  ------------------------------------------------------------------- Aorta:  Aortic root: The aortic root was normal in size.  ------------------------------------------------------------------- Mitral valve:   Mildly thickened leaflets . Mobility was not restricted. There was no systolic anterior motion.  Doppler: Transvalvular velocity was within the normal range. There was no evidence for  stenosis. There was mild to moderate regurgitation directed eccentrically and posteriorly.  ------------------------------------------------------------------- Left atrium:  The atrium was mildly dilated.  ------------------------------------------------------------------- Right ventricle:  The cavity size was normal. Wall thickness was normal. Systolic function was normal.  ------------------------------------------------------------------- Pulmonic valve:    Structurally normal valve.   Cusp separation was normal.  Doppler:  Transvalvular velocity was within the normal range. There was no evidence for stenosis. There was trivial regurgitation.  ------------------------------------------------------------------- Tricuspid valve:   Structurally normal valve.    Doppler: Transvalvular velocity was within the normal range. There was mild-moderate regurgitation directed centrally.  ------------------------------------------------------------------- Pulmonary artery:   The main pulmonary artery was normal-sized. Systolic pressure was mildly increased.  ------------------------------------------------------------------- Right atrium:  The atrium was mildly dilated.  ------------------------------------------------------------------- Pericardium:  There was no pericardial effusion.  ------------------------------------------------------------------- Systemic veins: Inferior vena cava: The vessel was dilated. The respirophasic diameter changes were blunted (< 50%), consistent with elevated central venous pressure.  ------------------------------------------------------------------- Measurements   Left ventricle                         Value        Reference  LV ID, ED, PLAX chordal        (  L)     42.9  mm     43 - 52  LV ID, ES, PLAX chordal                29.5  mm     23 - 38  LV fx shortening, PLAX chordal         31    %      >=29  LV PW thickness, ED                     12.4  mm     ----------  IVS/LV PW ratio, ED                    0.89         <=1.3  Stroke volume, 2D                      62    ml     ----------  Stroke volume/bsa, 2D                  44    ml/m^2 ----------    Ventricular septum                     Value        Reference  IVS thickness, ED                      11    mm     ----------    LVOT                                   Value        Reference  LVOT ID, S                             23    mm     ----------  LVOT area                              4.15  cm^2   ----------  LVOT peak velocity, S                  73.2  cm/s   ----------  LVOT mean velocity, S                  42.6  cm/s   ----------  LVOT VTI, S                            14.9  cm     ----------    Aorta                                  Value        Reference  Aortic root ID, ED                     33    mm     ----------  Ascending aorta ID, A-P, S             32    mm     ----------  Left atrium                            Value        Reference  LA ID, A-P, ES                         39    mm     ----------  LA ID/bsa, A-P                 (H)     2.75  cm/m^2 <=2.2  LA volume, S                           99.5  ml     ----------  LA volume/bsa, S                       70.2  ml/m^2 ----------  LA volume, ES, 1-p A4C                 79.4  ml     ----------  LA volume/bsa, ES, 1-p A4C             56    ml/m^2 ----------  LA volume, ES, 1-p A2C                 117   ml     ----------  LA volume/bsa, ES, 1-p A2C             82.6  ml/m^2 ----------    Pulmonary arteries                     Value        Reference  PA pressure, S, DP             (H)     36    mm Hg  <=30    Tricuspid valve                        Value        Reference  Tricuspid regurg peak velocity         288   cm/s   ----------  Tricuspid peak RV-RA gradient          33    mm Hg  ----------    Right atrium                           Value        Reference  RA ID, S-I, ES, A4C            (H)      58    mm     34 - 49  RA area, ES, A4C               (H)     23.9  cm^2   8.3 - 19.5  RA volume, ES, A/L                     80    ml     ----------  RA volume/bsa, ES, A/L                 56.5  ml/m^2 ----------    Systemic veins  Value        Reference  Estimated CVP                          3     mm Hg  ----------    Right ventricle                        Value        Reference  TAPSE                                  20.7  mm     ----------  RV pressure, S, DP             (H)     36    mm Hg  <=30  RV s&', lateral, S                      9.37  cm/s   ----------  Legend: (L)  and  (H)  mark values outside specified reference range.  ------------------------------------------------------------------- Prepared and Electronically Authenticated by  Sanda Klein, MD 2018-09-15T17:13:39 [facility] Images   Show images for ECHOCARDIOGRAM COMPLETE Patient Information   Patient Name Ian Hill, Ian Hill Sex Male DOB 1950/06/22 SSN [id number] Reason for Exam  Priority: Routine  Comments:  Surgical History   Surgical History    No past medical history on file.  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 SISTER NOT SURE OF THIS Provider CARDIOVASCULAR STRESS TEST  11-19-2016 dr Stanford Breed Low risk nuclear study/ normal resting and stress perfusion with no ischemia or infarction/ EF not done due to PVCIs , TID borderline, 1.2 significant LVH Provider IR PERCUTANEOUS ART THROMBECTOMY/INFUSION INTRACRANIAL INC DIAG ANGIO  01/23/2017  Provider RADIOLOGY WITH ANESTHESIA N/A 01/23/2017 Procedure: RADIOLOGY WITH ANESTHESIA; Surgeon: Luanne Bras, MD; Location: [facility]; Service: Radiology; Laterality: N/A; Provider TRANSTHORACIC ECHOCARDIOGRAM  11-20-2016  dr Stanford Breed mild LVH, apical hypertrophic cardiomyopathy, systolic function vigorous, ef 02-40%, grade 2 diastolic  dysfunction/ mild AR, MR, and PR/ moderate LAE and RAE/ mild to mod. TR/ moderate increase PASP 50mmHg Provider TRANSURETHRAL RESECTION OF PROSTATE N/A 11/24/2016 Procedure: TRANSURETHRAL RESECTION OF THE PROSTATE (TURP); Surgeon: Franchot Gallo, MD; Location: [facility]; Service: Urology; Laterality: N/A; Provider  Patient Data   Height  70 in  BP  103/64 mmHg    Performing Technologist/Nurse   Performing Technologist/Nurse: Kenney Houseman, RDCS  2D Measurements    LV PW d  12.4 mm (Range: 0.6 - 1.1)  FS  31 % (Range: 28 - 44)  LA ID, A-P, ES  39 mm  LA diam end sys  39 mm  LA vol  99.5 mL  LA vol index  70.2 mL/m2  IVS/LV PW RATIO, ED  .89   FS  31 % (Range: 28 - 44)    Right Ventricle Measurements    Lateral S' vel  9.37 cm/sec  TAPSE  20.7 mm  RV sys press  36 mmHg    Aortic Valve Measurements    Stenosis LVOT diameter  23 mm  LVOT area  4.15 cm2  LVOT VTI  14.9 cm  LVOT peak vel  73.2 cm/s       Aortic Root Measurements - End Diastolic    Ao-asc  32  cm    Left Atrium Measurements    LA ID, A-P, ES  39 mm  LA vol  99.5 mL  LA vol index  70.2 mL/m2  LA vol A4C  79.4 ml  LVOT SV  62 mL     Tricuspid Valve Measurements    PISA TR max vel  288 cm/s        Implants    No active implants to display in this view. Order-Level Documents - 01/23/2017:   Scan on 01/23/2017 9:41 PM by Dennie Maizes on 01/23/2017 9:41 PM by Haywood Lasso Documents - 01/23/2017:   Scan on 02/16/2017 2:53 PM by Default, Provider, MDScan on 02/16/2017 2:53 PM by Default, Provider, MD  Scan on 02/16/2017 2:51 PM by Default, Provider, MDScan on 02/16/2017 2:51 PM by Default, Provider, MD  Scan on 01/28/2017 5:42 PM by Victory Dakin on 01/28/2017 5:42 PM by Lindwood Qua  Scan on 01/28/2017 5:42 PM by Victory Dakin on 01/28/2017 5:42 PM by Lindwood Qua   Scan on 01/28/2017 11:48 AM by Default, Provider, MDScan on 01/28/2017 11:48 AM by Default, Provider, MD  Document on 01/27/2017 10:05 PM by Leafy Kindle: ED PB Summary  Scan on 01/28/2017 10:36 AM by Default, Provider, MDScan on 01/28/2017 10:36 AM by Default, Provider, MD  Scan on 01/28/2017 9:08 AM by Default, Provider, MDScan on 01/28/2017 9:08 AM by Default, Provider, MD    Signed   Electronically signed by Sanda Klein, MD on 01/24/17 at 1714 EDT Printable Result Report    Result Report  External Result Report    External Result Report     Reproductive/Obstetrics                           Anesthesia Physical  Anesthesia Plan  ASA: III  Anesthesia Plan: General   Post-op Pain Management:    Induction:   PONV Risk Score and Plan: 2 and Ondansetron  Airway Management Planned: LMA  Additional Equipment: Arterial line  Intra-op Plan:   Post-operative Plan: Extubation in OR  Informed Consent: I have reviewed the patients History and Physical, chart, labs and discussed the procedure including the risks, benefits and alternatives for the proposed anesthesia with the patient or authorized representative who has indicated his/her understanding and acceptance.   Dental advisory given  Plan Discussed with: CRNA and Surgeon  Anesthesia Plan Comments:         Anesthesia Quick Evaluation

## 2018-03-10 NOTE — Transfer of Care (Signed)
Immediate Anesthesia Transfer of Care Note  Patient: Ian Hill  Procedure(s) Performed: GOLD SEED IMPLANT (N/A Prostate) SPACE OAR INSTILLATION (N/A Prostate)  Patient Location: PACU  Anesthesia Type:General  Level of Consciousness: awake, alert , oriented and patient cooperative  Airway & Oxygen Therapy: Patient Spontanous Breathing and Patient connected to nasal cannula oxygen  Post-op Assessment: Report given to RN and Post -op Vital signs reviewed and stable  Post vital signs: Reviewed and stable  Last Vitals:  Vitals Value Taken Time  BP 122/71 03/10/2018  9:24 AM  Temp    Pulse    Resp 23 03/10/2018  9:25 AM  SpO2    Vitals shown include unvalidated device data.  Last Pain:  Vitals:   03/10/18 0721  TempSrc:   PainSc: 0-No pain      Patients Stated Pain Goal: 4 (00/17/49 4496)  Complications: No apparent anesthesia complications

## 2018-03-10 NOTE — Anesthesia Postprocedure Evaluation (Signed)
Anesthesia Post Note  Patient: Ian Hill  Procedure(s) Performed: GOLD SEED IMPLANT (N/A Prostate) SPACE OAR INSTILLATION (N/A Prostate)     Patient location during evaluation: PACU Anesthesia Type: General Level of consciousness: awake Pain management: pain level controlled Vital Signs Assessment: post-procedure vital signs reviewed and stable Respiratory status: spontaneous breathing Cardiovascular status: stable Postop Assessment: no apparent nausea or vomiting Anesthetic complications: no    Last Vitals:  Vitals:   03/10/18 0945 03/10/18 1000  BP: 134/70 139/82  Pulse: (!) 57 (!) 53  Resp: (!) 21 20  Temp:    SpO2: 91% 92%    Last Pain:  Vitals:   03/10/18 1015  TempSrc:   PainSc: 0-No pain   Pain Goal: Patients Stated Pain Goal: 4 (03/10/18 0721)               Huston Foley

## 2018-03-10 NOTE — Interval H&P Note (Signed)
History and Physical Interval Note:  03/10/2018 8:15 AM  Ian Hill  has presented today for surgery, with the diagnosis of PROSTATE CANCER  The various methods of treatment have been discussed with the patient and family. After consideration of risks, benefits and other options for treatment, the patient has consented to  Procedure(s): GOLD SEED IMPLANT (N/A) SPACE OAR INSTILLATION (N/A) as a surgical intervention .  The patient's history has been reviewed, patient examined, no change in status, stable for surgery.  I have reviewed the patient's chart and labs.  Questions were answered to the patient's satisfaction.     Lillette Boxer Enora Trillo

## 2018-03-10 NOTE — Telephone Encounter (Signed)
CALLED PATIENT TO INFORM OF SIM FOR 03-12-18 AND HIS MRI ON 03-13-18 - ARRIVAL TIME - 9:30 AM @ [facility], NO RESTRICTIONS TO TEST, SPOKE WITH PATIENT'S SISTER- JULIA BROWN AND SHE IS AWARE OF THESE APPTS.

## 2018-03-10 NOTE — Op Note (Signed)
Preoperative diagnosis:  High-risk prostate cancer, with expectant external beam radiotherapy   Postoperative diagnosis:  Same  Principal procedure:  Transrectal ultrasound, guidance for placement of fiducial markers x3, placement of Space OAR  Surgeon:  Debrah Granderson  Radiation oncologist: Tammi Klippel   Estimated blood loss:  Less than 25 mL   Complications: None   Anesthesia: General with LMA  Indications: 67 year old male with high risk prostate cancer.  He has initiated long-term androgen deprivation therapy.  He presents at this time for placement of fiducial markers in Space OAR in advance of external beam radiotherapy   Description of procedure:  The patient was properly identified in the holding area.  He received preoperative IV antibiotics.  He was taken to the operating room where  General anesthetic was administered.  He was placed in the dorsal lithotomy position.  Genitalia and perineum were prepped and draped, scrotum was retracted anteriorly.  Proper time-out was performed.  Transrectal ultrasound probe was advanced into the rectum.  Scanning of the prostate was performed.  Fiducial markers were placed  Through a transperineal approach times 3-1 in the right base of the prostate, 1 in the right apex, and 1 in the left mid lateral prostate.  Following this, the Space OAR needle was passed  Through a perineal approach into the  space between the prostate and rectum/ Denonvieler's  Fascia.  Once this was identified to be in the midline and in the mid prostate, the Space OAR was injected over time period of approximately 10 seconds.  At this point, the needle  Was removed and the procedure terminated.  The patient was then awakened and taken to the PACU in stable condition, having tolerated the procedure well.

## 2018-03-10 NOTE — Discharge Instructions (Signed)

## 2018-03-12 ENCOUNTER — Ambulatory Visit
Admission: RE | Admit: 2018-03-12 | Discharge: 2018-03-12 | Disposition: A | Discharge status: Home / Self Care | Payer: Medicare (Managed Care) | Source: Ambulatory Visit | Attending: Radiation Oncology | Admitting: Radiation Oncology

## 2018-03-12 ENCOUNTER — Encounter (HOSPITAL_BASED_OUTPATIENT_CLINIC_OR_DEPARTMENT_OTHER): Payer: Self-pay | Admitting: Urology

## 2018-03-12 DIAGNOSIS — C61 Malignant neoplasm of prostate: Secondary | ICD-10-CM | POA: Diagnosis present

## 2018-03-12 DIAGNOSIS — Z510 Encounter for antineoplastic radiation therapy: Secondary | ICD-10-CM | POA: Insufficient documentation

## 2018-03-12 NOTE — Progress Notes (Signed)
  Radiation Oncology         [phone] ________________________________  Name: Ian Hill MRN: [id number]  Date: 03/12/2018  DOB: 26-Jan-1951  SIMULATION AND TREATMENT PLANNING NOTE    ICD-10-CM   1. Prostate cancer ([name]) C61     DIAGNOSIS:  67 y.o. gentleman with unfavorable intermediate risk Stage T1a adenocarcinoma of the prostate with Gleason Score of 4+3=7, and PSA of 7  NARRATIVE:  The patient was brought to the [facility].  Identity was confirmed.  All relevant records and images related to the planned course of therapy were reviewed.  The patient freely provided informed written consent to proceed with treatment after reviewing the details related to the planned course of therapy. The consent form was witnessed and verified by the simulation staff.  Then, the patient was set-up in a stable reproducible supine position for radiation therapy.  A vacuum lock pillow device was custom fabricated to position his legs in a reproducible immobilized position.  Then, I performed a urethrogram under sterile conditions to identify the prostatic apex.  CT images were obtained.  Surface markings were placed.  The CT images were loaded into the planning software.  Then the prostate target and avoidance structures including the rectum, bladder, bowel and hips were contoured.  Treatment planning then occurred.  The radiation prescription was entered and confirmed.  A total of one complex treatment devices was fabricated. I have requested : Intensity Modulated Radiotherapy (IMRT) is medically necessary for this case for the following reason:  Rectal sparing.Marland Kitchen  PLAN:  The patient will receive 70 Gy in 28 fractions.  ________________________________  Sheral Apley Tammi Klippel, M.D.

## 2018-03-13 ENCOUNTER — Ambulatory Visit (HOSPITAL_COMMUNITY): Payer: Medicare (Managed Care)

## 2018-03-17 ENCOUNTER — Ambulatory Visit (HOSPITAL_COMMUNITY)
Admission: RE | Admit: 2018-03-17 | Discharge: 2018-03-17 | Disposition: A | Discharge status: Home / Self Care | Payer: Medicare (Managed Care) | Source: Ambulatory Visit | Attending: Urology | Admitting: Urology

## 2018-03-17 DIAGNOSIS — C61 Malignant neoplasm of prostate: Secondary | ICD-10-CM | POA: Insufficient documentation

## 2018-03-21 NOTE — Addendum Note (Signed)
Encounter addended by: Tyler Pita, MD on: 03/21/2018 5:43 PM  Actions taken: Medication List reviewed, Problem List reviewed, Allergies reviewed

## 2018-03-22 DIAGNOSIS — Z510 Encounter for antineoplastic radiation therapy: Secondary | ICD-10-CM | POA: Diagnosis not present

## 2018-03-23 ENCOUNTER — Ambulatory Visit
Admission: RE | Admit: 2018-03-23 | Discharge: 2018-03-23 | Disposition: A | Discharge status: Home / Self Care | Payer: Medicare (Managed Care) | Source: Ambulatory Visit | Attending: Radiation Oncology | Admitting: Radiation Oncology

## 2018-03-23 DIAGNOSIS — Z510 Encounter for antineoplastic radiation therapy: Secondary | ICD-10-CM | POA: Diagnosis not present

## 2018-03-24 ENCOUNTER — Ambulatory Visit
Admission: RE | Admit: 2018-03-24 | Discharge: 2018-03-24 | Disposition: A | Discharge status: Home / Self Care | Payer: Medicare (Managed Care) | Source: Ambulatory Visit | Attending: Radiation Oncology | Admitting: Radiation Oncology

## 2018-03-24 DIAGNOSIS — Z510 Encounter for antineoplastic radiation therapy: Secondary | ICD-10-CM | POA: Diagnosis not present

## 2018-03-25 ENCOUNTER — Ambulatory Visit
Admission: RE | Admit: 2018-03-25 | Discharge: 2018-03-25 | Disposition: A | Discharge status: Home / Self Care | Payer: Medicare (Managed Care) | Source: Ambulatory Visit | Attending: Radiation Oncology | Admitting: Radiation Oncology

## 2018-03-25 DIAGNOSIS — Z510 Encounter for antineoplastic radiation therapy: Secondary | ICD-10-CM | POA: Diagnosis not present

## 2018-03-26 ENCOUNTER — Ambulatory Visit
Admission: RE | Admit: 2018-03-26 | Discharge: 2018-03-26 | Disposition: A | Discharge status: Home / Self Care | Payer: Medicare (Managed Care) | Source: Ambulatory Visit | Attending: Radiation Oncology | Admitting: Radiation Oncology

## 2018-03-26 DIAGNOSIS — Z510 Encounter for antineoplastic radiation therapy: Secondary | ICD-10-CM | POA: Diagnosis not present

## 2018-03-29 ENCOUNTER — Ambulatory Visit
Admission: RE | Admit: 2018-03-29 | Discharge: 2018-03-29 | Disposition: A | Discharge status: Home / Self Care | Payer: Medicare (Managed Care) | Source: Ambulatory Visit | Attending: Radiation Oncology | Admitting: Radiation Oncology

## 2018-03-29 DIAGNOSIS — Z510 Encounter for antineoplastic radiation therapy: Secondary | ICD-10-CM | POA: Diagnosis not present

## 2018-03-30 ENCOUNTER — Ambulatory Visit
Admission: RE | Admit: 2018-03-30 | Discharge: 2018-03-30 | Disposition: A | Discharge status: Home / Self Care | Payer: Medicare (Managed Care) | Source: Ambulatory Visit | Attending: Radiation Oncology | Admitting: Radiation Oncology

## 2018-03-30 DIAGNOSIS — Z510 Encounter for antineoplastic radiation therapy: Secondary | ICD-10-CM | POA: Diagnosis not present

## 2018-03-31 ENCOUNTER — Ambulatory Visit
Admission: RE | Admit: 2018-03-31 | Discharge: 2018-03-31 | Disposition: A | Discharge status: Home / Self Care | Payer: Medicare (Managed Care) | Source: Ambulatory Visit | Attending: Radiation Oncology | Admitting: Radiation Oncology

## 2018-03-31 DIAGNOSIS — Z510 Encounter for antineoplastic radiation therapy: Secondary | ICD-10-CM | POA: Diagnosis not present

## 2018-04-01 ENCOUNTER — Ambulatory Visit
Admission: RE | Admit: 2018-04-01 | Discharge: 2018-04-01 | Disposition: A | Discharge status: Home / Self Care | Payer: Medicare (Managed Care) | Source: Ambulatory Visit | Attending: Radiation Oncology | Admitting: Radiation Oncology

## 2018-04-01 DIAGNOSIS — Z510 Encounter for antineoplastic radiation therapy: Secondary | ICD-10-CM | POA: Diagnosis not present

## 2018-04-02 ENCOUNTER — Ambulatory Visit
Admission: RE | Admit: 2018-04-02 | Discharge: 2018-04-02 | Disposition: A | Discharge status: Home / Self Care | Payer: Medicare (Managed Care) | Source: Ambulatory Visit | Attending: Radiation Oncology | Admitting: Radiation Oncology

## 2018-04-02 DIAGNOSIS — Z510 Encounter for antineoplastic radiation therapy: Secondary | ICD-10-CM | POA: Diagnosis not present

## 2018-04-05 ENCOUNTER — Ambulatory Visit
Admission: RE | Admit: 2018-04-05 | Discharge: 2018-04-05 | Disposition: A | Discharge status: Home / Self Care | Payer: Medicare (Managed Care) | Source: Ambulatory Visit | Attending: Radiation Oncology | Admitting: Radiation Oncology

## 2018-04-05 DIAGNOSIS — Z510 Encounter for antineoplastic radiation therapy: Secondary | ICD-10-CM | POA: Diagnosis not present

## 2018-04-06 ENCOUNTER — Ambulatory Visit
Admission: RE | Admit: 2018-04-06 | Discharge: 2018-04-06 | Disposition: A | Discharge status: Home / Self Care | Payer: Medicare (Managed Care) | Source: Ambulatory Visit | Attending: Radiation Oncology | Admitting: Radiation Oncology

## 2018-04-06 DIAGNOSIS — Z510 Encounter for antineoplastic radiation therapy: Secondary | ICD-10-CM | POA: Diagnosis not present

## 2018-04-07 ENCOUNTER — Ambulatory Visit
Admission: RE | Admit: 2018-04-07 | Discharge: 2018-04-07 | Disposition: A | Discharge status: Home / Self Care | Payer: Medicare (Managed Care) | Source: Ambulatory Visit | Attending: Radiation Oncology | Admitting: Radiation Oncology

## 2018-04-07 DIAGNOSIS — Z510 Encounter for antineoplastic radiation therapy: Secondary | ICD-10-CM | POA: Diagnosis not present

## 2018-04-12 ENCOUNTER — Ambulatory Visit
Admission: RE | Admit: 2018-04-12 | Discharge: 2018-04-12 | Disposition: A | Discharge status: Home / Self Care | Payer: Medicare (Managed Care) | Source: Ambulatory Visit | Attending: Radiation Oncology | Admitting: Radiation Oncology

## 2018-04-12 DIAGNOSIS — Z510 Encounter for antineoplastic radiation therapy: Secondary | ICD-10-CM | POA: Diagnosis not present

## 2018-04-12 DIAGNOSIS — C61 Malignant neoplasm of prostate: Secondary | ICD-10-CM | POA: Diagnosis present

## 2018-04-13 ENCOUNTER — Ambulatory Visit
Admission: RE | Admit: 2018-04-13 | Discharge: 2018-04-13 | Disposition: A | Discharge status: Home / Self Care | Payer: Medicare (Managed Care) | Source: Ambulatory Visit | Attending: Radiation Oncology | Admitting: Radiation Oncology

## 2018-04-13 DIAGNOSIS — Z510 Encounter for antineoplastic radiation therapy: Secondary | ICD-10-CM | POA: Diagnosis not present

## 2018-04-14 ENCOUNTER — Ambulatory Visit
Admission: RE | Admit: 2018-04-14 | Discharge: 2018-04-14 | Disposition: A | Discharge status: Home / Self Care | Payer: Medicare (Managed Care) | Source: Ambulatory Visit | Attending: Radiation Oncology | Admitting: Radiation Oncology

## 2018-04-14 DIAGNOSIS — Z510 Encounter for antineoplastic radiation therapy: Secondary | ICD-10-CM | POA: Diagnosis not present

## 2018-04-15 ENCOUNTER — Ambulatory Visit
Admission: RE | Admit: 2018-04-15 | Discharge: 2018-04-15 | Disposition: A | Discharge status: Home / Self Care | Payer: Medicare (Managed Care) | Source: Ambulatory Visit | Attending: Radiation Oncology | Admitting: Radiation Oncology

## 2018-04-15 DIAGNOSIS — Z510 Encounter for antineoplastic radiation therapy: Secondary | ICD-10-CM | POA: Diagnosis not present

## 2018-04-16 ENCOUNTER — Ambulatory Visit
Admission: RE | Admit: 2018-04-16 | Discharge: 2018-04-16 | Disposition: A | Discharge status: Home / Self Care | Payer: Medicare (Managed Care) | Source: Ambulatory Visit | Attending: Radiation Oncology | Admitting: Radiation Oncology

## 2018-04-16 DIAGNOSIS — Z510 Encounter for antineoplastic radiation therapy: Secondary | ICD-10-CM | POA: Diagnosis not present

## 2018-04-19 ENCOUNTER — Ambulatory Visit
Admission: RE | Admit: 2018-04-19 | Discharge: 2018-04-19 | Disposition: A | Discharge status: Home / Self Care | Payer: Medicare (Managed Care) | Source: Ambulatory Visit | Attending: Radiation Oncology | Admitting: Radiation Oncology

## 2018-04-19 DIAGNOSIS — Z510 Encounter for antineoplastic radiation therapy: Secondary | ICD-10-CM | POA: Diagnosis not present

## 2018-04-20 ENCOUNTER — Ambulatory Visit
Admission: RE | Admit: 2018-04-20 | Discharge: 2018-04-20 | Disposition: A | Discharge status: Home / Self Care | Payer: Medicare (Managed Care) | Source: Ambulatory Visit | Attending: Radiation Oncology | Admitting: Radiation Oncology

## 2018-04-20 DIAGNOSIS — Z510 Encounter for antineoplastic radiation therapy: Secondary | ICD-10-CM | POA: Diagnosis not present

## 2018-04-21 ENCOUNTER — Ambulatory Visit
Admission: RE | Admit: 2018-04-21 | Discharge: 2018-04-21 | Disposition: A | Discharge status: Home / Self Care | Payer: Medicare (Managed Care) | Source: Ambulatory Visit | Attending: Radiation Oncology | Admitting: Radiation Oncology

## 2018-04-21 DIAGNOSIS — Z510 Encounter for antineoplastic radiation therapy: Secondary | ICD-10-CM | POA: Diagnosis not present

## 2018-04-22 ENCOUNTER — Ambulatory Visit
Admission: RE | Admit: 2018-04-22 | Discharge: 2018-04-22 | Disposition: A | Discharge status: Home / Self Care | Payer: Medicare (Managed Care) | Source: Ambulatory Visit | Attending: Radiation Oncology | Admitting: Radiation Oncology

## 2018-04-22 DIAGNOSIS — Z510 Encounter for antineoplastic radiation therapy: Secondary | ICD-10-CM | POA: Diagnosis not present

## 2018-04-23 ENCOUNTER — Ambulatory Visit
Admission: RE | Admit: 2018-04-23 | Discharge: 2018-04-23 | Disposition: A | Discharge status: Home / Self Care | Payer: Medicare (Managed Care) | Source: Ambulatory Visit | Attending: Radiation Oncology | Admitting: Radiation Oncology

## 2018-04-23 DIAGNOSIS — Z510 Encounter for antineoplastic radiation therapy: Secondary | ICD-10-CM | POA: Diagnosis not present

## 2018-04-26 ENCOUNTER — Ambulatory Visit
Admission: RE | Admit: 2018-04-26 | Discharge: 2018-04-26 | Disposition: A | Discharge status: Home / Self Care | Payer: Medicare (Managed Care) | Source: Ambulatory Visit | Attending: Radiation Oncology | Admitting: Radiation Oncology

## 2018-04-26 DIAGNOSIS — Z510 Encounter for antineoplastic radiation therapy: Secondary | ICD-10-CM | POA: Diagnosis not present

## 2018-04-27 ENCOUNTER — Ambulatory Visit
Admission: RE | Admit: 2018-04-27 | Discharge: 2018-04-27 | Disposition: A | Discharge status: Home / Self Care | Payer: Medicare (Managed Care) | Source: Ambulatory Visit | Attending: Radiation Oncology | Admitting: Radiation Oncology

## 2018-04-27 DIAGNOSIS — Z510 Encounter for antineoplastic radiation therapy: Secondary | ICD-10-CM | POA: Diagnosis not present

## 2018-04-28 ENCOUNTER — Ambulatory Visit
Admission: RE | Admit: 2018-04-28 | Discharge: 2018-04-28 | Disposition: A | Discharge status: Home / Self Care | Payer: Medicare (Managed Care) | Source: Ambulatory Visit | Attending: Radiation Oncology | Admitting: Radiation Oncology

## 2018-04-28 DIAGNOSIS — Z510 Encounter for antineoplastic radiation therapy: Secondary | ICD-10-CM | POA: Diagnosis not present

## 2018-04-29 ENCOUNTER — Ambulatory Visit
Admission: RE | Admit: 2018-04-29 | Discharge: 2018-04-29 | Disposition: A | Discharge status: Home / Self Care | Payer: Medicare (Managed Care) | Source: Ambulatory Visit | Attending: Radiation Oncology | Admitting: Radiation Oncology

## 2018-04-29 DIAGNOSIS — Z510 Encounter for antineoplastic radiation therapy: Secondary | ICD-10-CM | POA: Diagnosis not present

## 2018-04-30 ENCOUNTER — Ambulatory Visit
Admission: RE | Admit: 2018-04-30 | Discharge: 2018-04-30 | Disposition: A | Discharge status: Home / Self Care | Payer: Medicare (Managed Care) | Source: Ambulatory Visit | Attending: Radiation Oncology | Admitting: Radiation Oncology

## 2018-04-30 DIAGNOSIS — Z510 Encounter for antineoplastic radiation therapy: Secondary | ICD-10-CM | POA: Diagnosis not present

## 2018-05-03 ENCOUNTER — Encounter: Payer: Self-pay | Admitting: Radiation Oncology

## 2018-05-03 ENCOUNTER — Ambulatory Visit
Admission: RE | Admit: 2018-05-03 | Discharge: 2018-05-03 | Disposition: A | Discharge status: Home / Self Care | Payer: Medicare (Managed Care) | Source: Ambulatory Visit | Attending: Radiation Oncology | Admitting: Radiation Oncology

## 2018-05-03 DIAGNOSIS — Z510 Encounter for antineoplastic radiation therapy: Secondary | ICD-10-CM | POA: Diagnosis not present

## 2018-05-09 NOTE — Progress Notes (Signed)
  Radiation Oncology         [phone] ________________________________  Name: Ian Hill MRN: [id number]  Date: 05/03/2018  DOB: [birth year]  End of Treatment Note  Diagnosis:    67 y.o. gentleman with unfavorable intermediate risk Stage T1a adenocarcinoma of the prostate with Gleason Score of 4+3=7, and PSA of 7     Indication for treatment:  Curative, Definitive Radiotherapy       Radiation treatment dates:   03/23/18-05/03/18  Site/dose:   The prostate was treated to 70 Gy in 28 fractions of 2.5 Gy  Beams/energy:   The patient was treated with IMRT using volumetric arc therapy delivering 6 MV X-rays to clockwise and counterclockwise circumferential arcs with a 90 degree collimator offset to avoid dose scalloping.  Image guidance was performed with daily cone beam CT prior to each fraction to align to gold markers in the prostate and assure proper bladder and rectal fill volumes.  Immobilization was achieved with BodyFix custom mold.  Narrative: The patient tolerated radiation treatment relatively well.   The patient experienced some minor urinary irritation and modest fatigue.  Plan: The patient has completed radiation treatment. He will return to radiation oncology clinic for routine followup in one month. I advised him to call or return sooner if he has any questions or concerns related to his recovery or treatment. ________________________________  Sheral Apley. Tammi Klippel, M.D.

## 2018-06-04 ENCOUNTER — Ambulatory Visit: Payer: Self-pay | Admitting: Urology

## 2018-06-09 NOTE — Progress Notes (Signed)
Radiation Oncology         [phone] ________________________________  Name: TAIJON VINK MRN: [id number]  Date: 06/10/2018  DOB: [birth year]  Post Treatment Note  CC: Linda Hedges, NP  Franchot Gallo, MD  Diagnosis:   68 y.o.gentleman with unfavorable intermediate riskStage T1aadenocarcinoma of the prostate with Gleason Score of4+3=7, and PSA of7.  Interval Since Last Radiation:  5 weeks  03/23/18-05/03/18: The prostate was treated to 70 Gy in 28 fractions of 2.5 Gy  Narrative:  The patient returns today for routine follow-up. He tolerated radiation treatment relatively well.   The patient experienced some minor urinary irritation and modest fatigue.  He was maintained on ADT throughout his course of radiotherapy which he continued to tolerate well.                           On review of systems, the patient states that he is doing very well overall.  He has had almost complete resolution of his LUTS with a current IPSS score of 6 indicating mild symptoms only.  He continues with mild increased daytime frequency and nocturia x3 which is almost back to his baseline.  He specifically denies dysuria, gross hematuria, straining, weak stream, urgency, incomplete bladder emptying or incontinence.  He reports a healthy appetite and is maintaining his weight.  He denies abdominal pain, nausea, vomiting, diarrhea or constipation.  He continues to tolerate his ADT very well with occasional mild hot flashes and mild fatigue.  Overall, he is quite pleased with his progress to date.  ALLERGIES:  has No Known Allergies.  Meds: Current Outpatient Medications  Medication Sig Dispense Refill  . apixaban (ELIQUIS) 5 MG TABS tablet Take 1 tablet (5 mg total) by mouth 2 (two) times daily. 60 tablet 0  . atenolol (TENORMIN) 50 MG tablet TAKE 1 TABLET (50 MG TOTAL) BY MOUTH EVERY MORNING. 30 tablet 4  . Cholecalciferol (VITAMIN D) 2000 units CAPS Take 2,000 Units by mouth daily.    .  feeding supplement, ENSURE ENLIVE, (ENSURE ENLIVE) LIQD Take 237 mLs by mouth 2 (two) times daily between meals. 237 mL 12  . feeding supplement, ENSURE ENLIVE, (ENSURE ENLIVE) LIQD Take 237 mLs by mouth 3 (three) times daily. 237 mL 12  . finasteride (PROSCAR) 5 MG tablet Take 1 tablet (5 mg total) by mouth daily. 30 tablet 0  . hydrochlorothiazide (HYDRODIURIL) 12.5 MG tablet Take 1 tablet (12.5 mg total) by mouth daily. (Patient taking differently: Take 25 mg by mouth daily. ) 90 tablet 2  . polyethylene glycol (MIRALAX / GLYCOLAX) packet Take 17 g by mouth daily as needed. 14 each 0  . simvastatin (ZOCOR) 40 MG tablet TAKE 1 TABLET (40 MG TOTAL) BY MOUTH AT BEDTIME. 90 tablet 2  . tamsulosin (FLOMAX) 0.4 MG CAPS capsule Take 1 capsule (0.4 mg total) by mouth daily. 30 capsule 0   No current facility-administered medications for this encounter.     Physical Findings:  weight is 128 lb (58.1 kg). His oral temperature is 97.7 F (36.5 C). His blood pressure is 120/91 (abnormal) and his pulse is 75. His respiration is 18 and oxygen saturation is 100%.  Pain Assessment Pain Score: 0-No pain/10 In general this is a well appearing African-American male in no acute distress. He's alert and oriented x4 and appropriate throughout the examination. Cardiopulmonary assessment is negative for acute distress and he exhibits normal effort.   Lab Findings: Lab Results  Component  Value Date   WBC 12.8 (H) 11/15/2017   HGB 16.0 03/10/2018   HCT 47.0 03/10/2018   MCV 89.9 11/15/2017   PLT 116 (L) 11/15/2017     Radiographic Findings: No results found.  Impression/Plan: 1. 68 y.o.gentleman with unfavorable intermediate riskStage T1aadenocarcinoma of the prostate with Gleason Score of4+3=7, and PSA of7. He will continue to follow up with urology for ongoing PSA determinations and has an appointment scheduled with Dr. Diona Fanti on 07/16/2018.  He continues to tolerate the ADT very well.  He  understands what to expect with regards to PSA monitoring going forward. I will look forward to following his response to treatment via correspondence with urology, and would be happy to continue to participate in his care if clinically indicated. I talked to the patient about what to expect in the future, including his risk for erectile dysfunction and rectal bleeding. I encouraged him to call or return to the office if he has any questions regarding his previous radiation or possible radiation side effects. He was comfortable with this plan and will follow up as needed.    Nicholos Johns, PA-C

## 2018-06-10 ENCOUNTER — Ambulatory Visit
Admission: RE | Admit: 2018-06-10 | Discharge: 2018-06-10 | Disposition: A | Discharge status: Home / Self Care | Payer: Medicare (Managed Care) | Source: Ambulatory Visit | Attending: Urology | Admitting: Urology

## 2018-06-10 ENCOUNTER — Encounter: Payer: Self-pay | Admitting: Urology

## 2018-06-10 ENCOUNTER — Other Ambulatory Visit: Payer: Self-pay

## 2018-06-10 VITALS — BP 120/91 | HR 75 | Temp 97.7°F | Resp 18 | Wt 128.0 lb

## 2018-06-10 DIAGNOSIS — Z923 Personal history of irradiation: Secondary | ICD-10-CM | POA: Diagnosis not present

## 2018-06-10 DIAGNOSIS — C61 Malignant neoplasm of prostate: Secondary | ICD-10-CM | POA: Insufficient documentation

## 2018-06-10 DIAGNOSIS — Z79899 Other long term (current) drug therapy: Secondary | ICD-10-CM | POA: Diagnosis not present

## 2018-06-10 DIAGNOSIS — Z7901 Long term (current) use of anticoagulants: Secondary | ICD-10-CM | POA: Diagnosis not present

## 2018-06-10 DIAGNOSIS — R351 Nocturia: Secondary | ICD-10-CM | POA: Insufficient documentation

## 2018-06-10 DIAGNOSIS — R5383 Other fatigue: Secondary | ICD-10-CM | POA: Diagnosis not present

## 2018-06-10 DIAGNOSIS — R232 Flushing: Secondary | ICD-10-CM | POA: Diagnosis not present

## 2018-10-09 IMAGING — CT CT ANGIO CHEST-ABD-PELV FOR DISSECTION W/ AND WO/W CM
2 of 7 series · 12 of 46 positions shown, 14 images · IV contrast (OMNI 350)
Comparison: CT abdomen and pelvis 02/20/2017

CLINICAL DATA: Sudden onset nausea, vomiting, and diarrhea with
abdominal pain tonight. Hypertensive.

EXAM:
CT ANGIOGRAPHY CHEST, ABDOMEN AND PELVIS
TECHNIQUE: Multidetector CT imaging through the chest, abdomen and pelvis was
performed using the standard protocol during bolus administration of
intravenous contrast. Multiplanar reconstructed images and MIPs were
obtained and reviewed to evaluate the vascular anatomy.
CONTRAST:  100mL 67YQZA-AG1 IOPAMIDOL (67YQZA-AG1) INJECTION 76%

[Series 7: dissection 2mm · axial · 0.75mm/px · z∈[+770,+1382]mm · 9 of 346 slices shown, 11 images]
[im 20/346  soft-tissue]
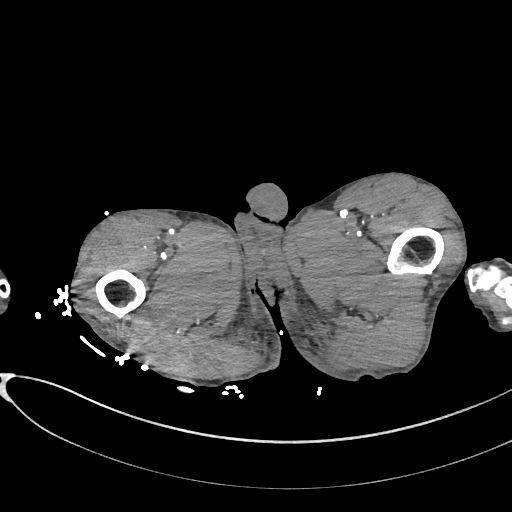
[im 20/346  bone]
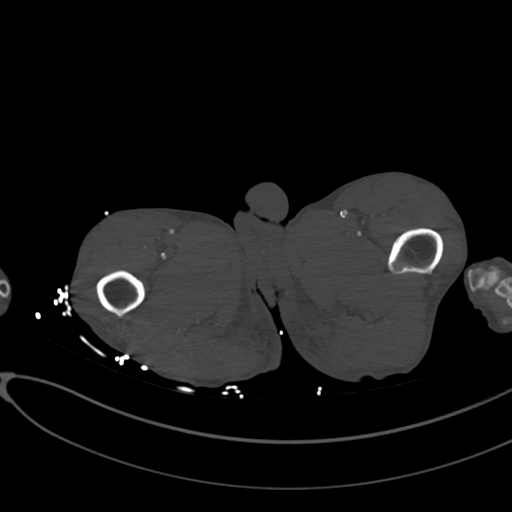
[im 58/346  soft-tissue]
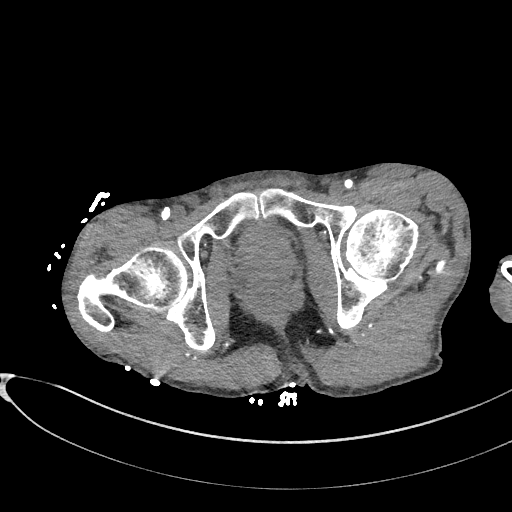
[im 96/346  soft-tissue]
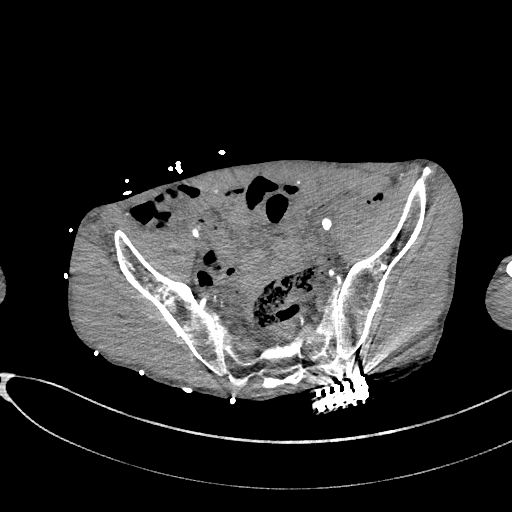
[im 135/346  soft-tissue]
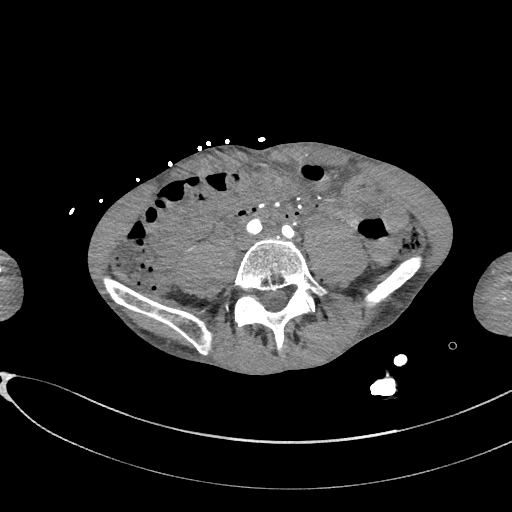
[im 173/346  soft-tissue]
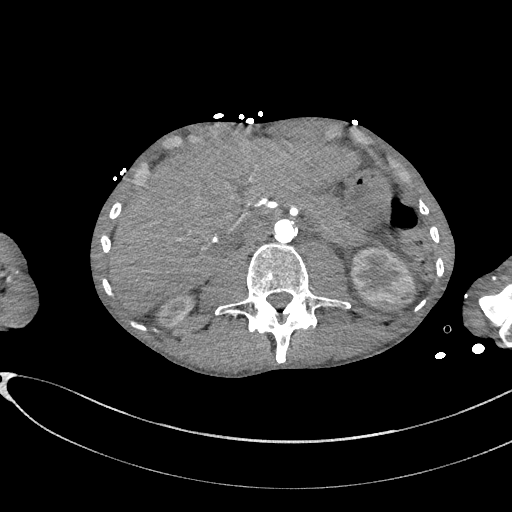
[im 211/346  soft-tissue]
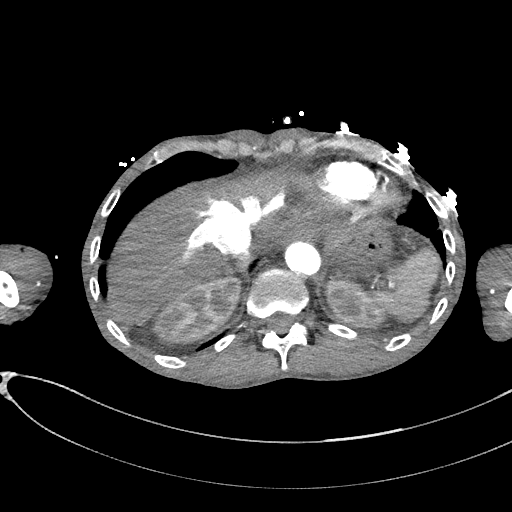
[im 250/346  soft-tissue]
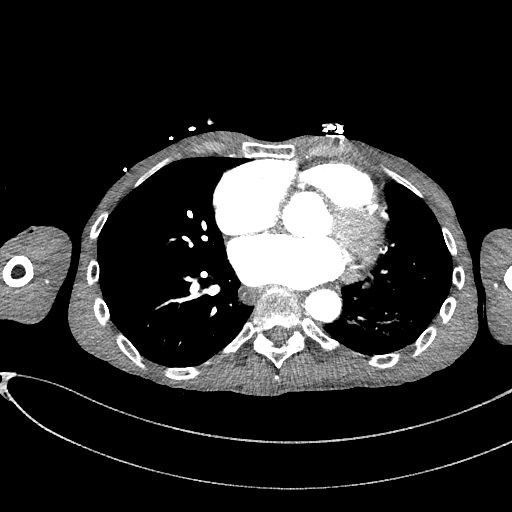
[im 288/346  soft-tissue]
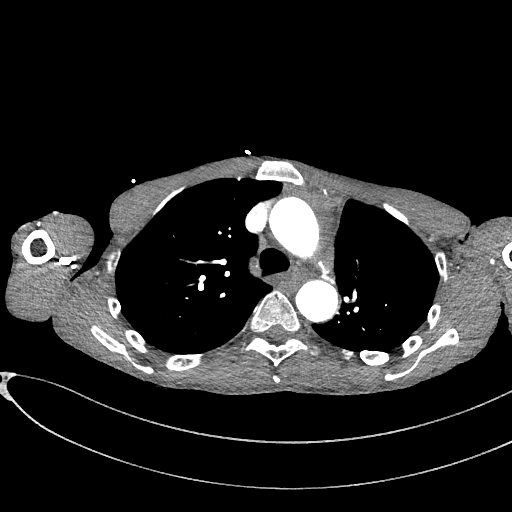
[im 326/346  soft-tissue]
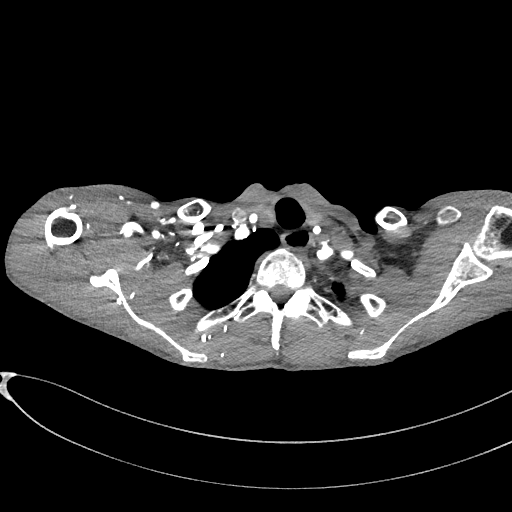
[im 326/346  bone]
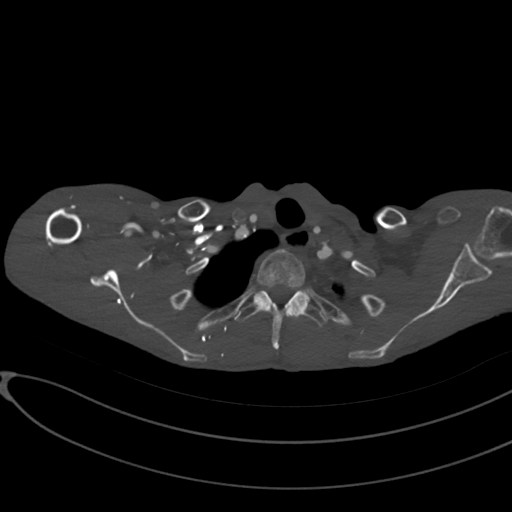

[Series 10: dissection 2mm cor · coronal · 0.72mm/px · 3 of 140 slices shown]
[im 35/140  soft-tissue]
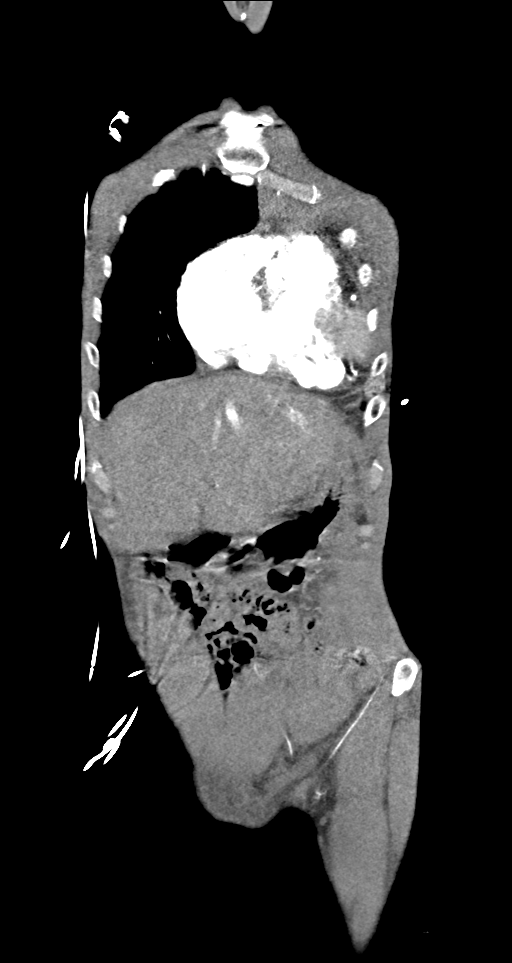
[im 70/140  soft-tissue]
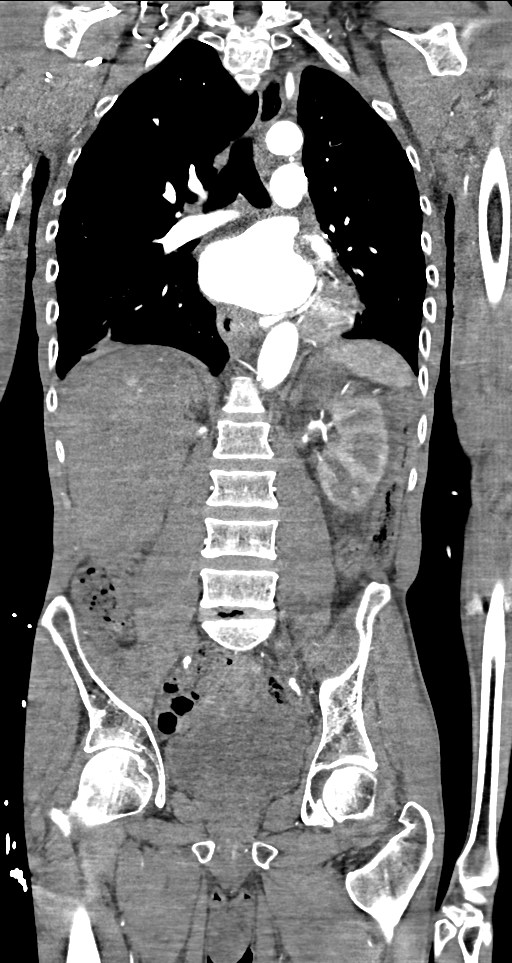
[im 105/140  soft-tissue]
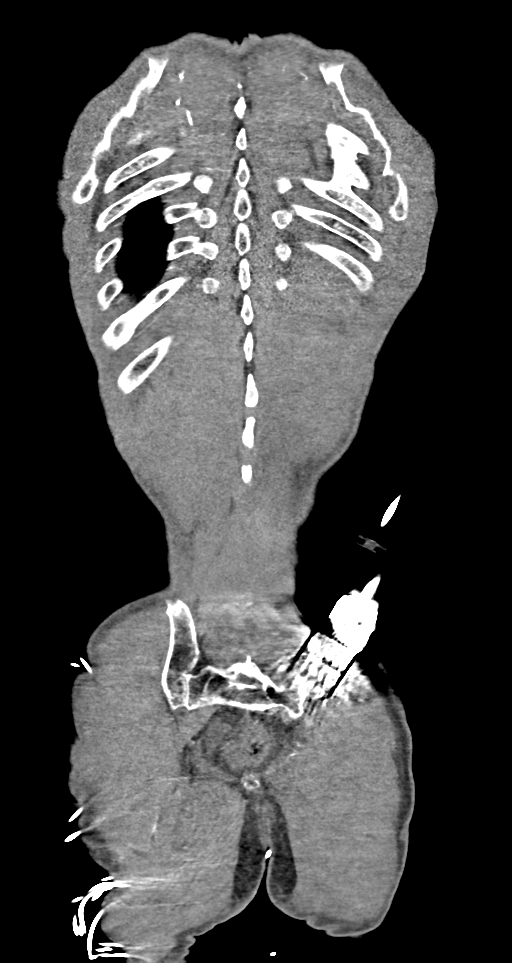

[12 of 46 positions shown; findings below may reference images not displayed]

FINDINGS: CTA CHEST FINDINGS

Cardiovascular: Noncontrast CT images of the chest demonstrate
scattered calcifications in the aorta. Calcification in the coronary
arteries. Calcified lymph nodes in the left mediastinum and left
hilum consistent with postinflammatory changes. Calcified granulomas
in the left lung. No evidence of intramural hematoma.

Images obtained during arterial phase after contrast administration
demonstrate normal caliber thoracic aorta. No aneurysm or
dissection. Great vessel origins are patent. Central pulmonary
arteries are well opacified. No evidence of significant pulmonary
embolus. Cardiac enlargement with particular enlargement of the
right atrium. Reflux of contrast material into the hepatic veins
suggest right heart failure.

Mediastinum/Nodes: No significant lymphadenopathy in the chest.
Upper esophagus is mildly dilated with debris in the esophagus. This
may be due to reflux or dysmotility.

Lungs/Pleura: Motion artifact limits examination. Patchy airspace
infiltrates in both lung bases, greater on the left suggesting
infectious or inflammatory process. Opacification of left lower lung
bronchi. This could be due to mucous plugging or aspiration. No
pleural effusions. No pneumothorax.

Musculoskeletal: No chest wall abnormality. No acute or significant
osseous findings. Coalition of left posterior ribs may be congenital
or postoperative.

Review of the MIP images confirms the above findings.

CTA ABDOMEN AND PELVIS FINDINGS

VASCULAR

Aorta: Normal caliber abdominal aorta. Scattered calcification and
noncalcified plaque formation. No dissection.

Celiac: Patent without evidence of aneurysm, dissection, vasculitis
or significant stenosis.

SMA: Patent without evidence of aneurysm, dissection, vasculitis or
significant stenosis.

Renals: Both renal arteries are patent without evidence of aneurysm,
dissection, vasculitis, fibromuscular dysplasia or significant
stenosis.

IMA: Patent without evidence of aneurysm, dissection, vasculitis or
significant stenosis.

Inflow: There areas of calcific stenosis demonstrated in the right
common iliac and external iliac arteries with probable high-grade
stenosis at the origin of the right femoral artery. Flow is
demonstrated distal to the area of stenosis.

Veins: No obvious venous abnormality within the limitations of this
arterial phase study.

Review of the MIP images confirms the above findings.

NON-VASCULAR

Examination is technically limited due to motion artifact, streak
artifact, and lack of abdominal fat.

Hepatobiliary: No focal liver abnormality is seen. No gallstones,
gallbladder wall thickening, or biliary dilatation.

Pancreas: Unremarkable. No pancreatic ductal dilatation or
surrounding inflammatory changes.

Spleen: Normal in size without focal abnormality.

Adrenals/Urinary Tract: Adrenal glands are unremarkable. Kidneys are
normal, without renal calculi, focal lesion, or hydronephrosis.
Bladder is unremarkable.

Stomach/Bowel: Stomach, small bowel, and colon are not abnormally
distended. No wall thickening is appreciated. Appendix is not
identified.

Lymphatic: No significant lymphadenopathy.

Reproductive: Prostate gland is enlarged.

Other: No free air or free fluid in the abdomen. Abdominal wall
musculature appears intact.

Musculoskeletal: No acute or significant osseous findings.

Review of the MIP images confirms the above findings.
IMPRESSION: 1. No evidence of aneurysm or dissection involving the thoracic or
abdominal aorta.
2. Aortic atherosclerosis.
3. Several areas of focal stenosis demonstrated in the right
external iliac artery with high-grade stenosis at the origin of the
right common femoral artery.
4. Patchy infiltrates in the lungs with opacification of left lower
lobe bronchi. This may represent multifocal pneumonia or aspiration.
5. Retained debris in the upper esophagus without distention may be
due to reflux or dysmotility.

## 2018-12-01 ENCOUNTER — Other Ambulatory Visit: Payer: Self-pay | Admitting: Urology

## 2018-12-06 ENCOUNTER — Other Ambulatory Visit (HOSPITAL_COMMUNITY)
Admission: RE | Admit: 2018-12-06 | Discharge: 2018-12-06 | Disposition: A | Discharge status: Home / Self Care | Payer: Medicare (Managed Care) | Source: Ambulatory Visit | Attending: Urology | Admitting: Urology

## 2018-12-06 DIAGNOSIS — Z20828 Contact with and (suspected) exposure to other viral communicable diseases: Secondary | ICD-10-CM | POA: Insufficient documentation

## 2018-12-06 LAB — SARS CORONAVIRUS 2 (TAT 6-24 HRS): SARS Coronavirus 2: NEGATIVE

## 2018-12-07 ENCOUNTER — Encounter (HOSPITAL_BASED_OUTPATIENT_CLINIC_OR_DEPARTMENT_OTHER): Payer: Self-pay | Admitting: *Deleted

## 2018-12-07 ENCOUNTER — Other Ambulatory Visit: Payer: Self-pay

## 2018-12-07 NOTE — Progress Notes (Addendum)
Spoke w/ pt sister, Gregary Signs, via phone for pre-op interview.  Sister verbalized understanding for pt to be npo after mn w/ exception clear liquids until 0800 then nothing by mouth, she verbalized understanding.  Arrive at 1200.  Needs istat and ekg.  Pt obtains his medication thru mail in packet's for am / pm meds and sister does not know which pill is what, so will not take meds moring of surgery, since unsure about atenolol.   Medication list was completed yesterday with nurse at [place] whom pt receives services from, [employer].  Per Izora Gala pt told to stop eliquis Tuesday 12-07-2018 from dr daglstedt office.  Per sister last dose eliquis evening of 12-06-2018.  eliquis is managed by pt cardiologist, dr Stanford Breed, have not received clearance.  Called and lvm for selita, or scheduler for for dahlstedt, cardiac clearance.   Chart to be reviewed by anesthesia , Konrad Felix PA.  ADDENDUM:  Received clearance for eliquis via fax from dr dahlstedt, placed in chart.  Noted pt eliquis managed by [facility]. Per anesthesia, Konrad Felix PA, ok to proceed.

## 2018-12-08 NOTE — Anesthesia Preprocedure Evaluation (Addendum)
Anesthesia Evaluation  Patient identified by MRN, date of birth, ID band Patient confused    Reviewed: Allergy & Precautions, NPO status , Patient's Chart, lab work & pertinent test results, reviewed documented beta blocker date and time , Unable to perform ROS - Chart review only  Airway Mallampati: I       Dental  (+) Edentulous Upper, Edentulous Lower   Pulmonary Current Smoker,    Pulmonary exam normal        Cardiovascular hypertension, Pt. on home beta blockers  Rhythm:Irregular Rate:Normal     Neuro/Psych CVA, Residual Symptoms    GI/Hepatic Neg liver ROS,   Endo/Other    Renal/GU Renal InsufficiencyRenal diseaseCKD II     Musculoskeletal   Abdominal Normal abdominal exam  (+)   Peds  Hematology negative hematology ROS (+)   Anesthesia Other Findings   Reproductive/Obstetrics                                                             Anesthesia Evaluation  Patient identified by MRN, date of birth, ID band Patient awake    Reviewed: Allergy & Precautions, NPO status , Patient's Chart, lab work & pertinent test results, Unable to perform ROS - Chart review onlyPreop documentation limited or incomplete due to emergent nature of procedure.  Airway Mallampati: II  TM Distance: >3 FB Neck ROM: Limited   Comment: C collar Dental  (+) Missing, Edentulous Upper, Edentulous Lower   Pulmonary Current Smoker,    Pulmonary exam normal breath sounds clear to auscultation       Cardiovascular hypertension, Pt. on medications and Pt. on home beta blockers + dysrhythmias Atrial Fibrillation  Rhythm:Regular Rate:Normal  Admission ekg with st depression/twave inversion and few PVCs   Neuro/Psych Acute MCA s/p TPA with left hemiparesis  CVA, Residual Symptoms negative psych ROS   GI/Hepatic negative GI ROS,   Endo/Other  negative endocrine ROS  Renal/GU Acute vs chronic  renal failure, Cr 1.5-1.65 on admission     Musculoskeletal negative musculoskeletal ROS (+)   Abdominal   Peds  Hematology negative hematology ROS (+)   Anesthesia Other Findings Ian Hill  ECHO COMPLETE WO IMAGING ENHANCING AGENT  Order# [id number]  Reading physician: Sanda Klein, MD Ordering physician: Aroor, Lanice Schwab, MD Study date: 01/24/17 Study Result   Result status: Final result                             *[facility]*                         [address], [place] [address]                            [phone]  ------------------------------------------------------------------- Transthoracic Echocardiography  Patient:    Ian Hill, Ian Hill MR #:       [id number] Study Date: 01/24/2017 Gender:  M Age:        68 Height:     177.8 cm Weight:     42.3 kg BSA:        1.42 m^2 Pt. Status: Room:       4N32C   SONOGRAPHER  Johny Chess, RDCS, CCT  PERFORMING   [facility], Inpatient  ATTENDING    Tegeler, Evelena Asa  ADMITTING    Aroor, Karena Addison R  ORDERING     Aroor, Karena Addison R  cc:  ------------------------------------------------------------------- LV EF: 55% -   60%  ------------------------------------------------------------------- Indications:      CVA 96.  ------------------------------------------------------------------- History:   Risk factors:  Chronic kidney disease. Hypertension.  ------------------------------------------------------------------- Study Conclusions  - Left ventricle: The cavity size was normal. There was hypertrophy   of the apex. The appearance was consistent with apical variant   hypertrophic cardiomyopathy. Systolic function was normal. The   estimated ejection fraction was in the range of 55% to 60%. There   was no dynamic obstruction. Wall motion was normal; there were no   regional wall motion abnormalities.  Features are consistent with   a pseudonormal left ventricular filling pattern, with concomitant   abnormal relaxation and increased filling pressure (grade 2   diastolic dysfunction). - Aortic valve: There was mild regurgitation directed centrally in   the LVOT. - Mitral valve: There was mild to moderate regurgitation directed   eccentrically and posteriorly. - Left atrium: The atrium was mildly dilated. - Right atrium: The atrium was mildly dilated. - Tricuspid valve: There was mild-moderate regurgitation directed   centrally. - Pulmonary arteries: Systolic pressure was mildly increased. PA   peak pressure: 36 mm Hg (S).  ------------------------------------------------------------------- Study data:  No prior study was available for comparison.  Study status:  Routine.  Procedure:  The patient was unable to respond to pain level query due to mechanical ventilation. The patient would not grade their pain level when asked, but did not appear in distress pre or post test. Transthoracic echocardiography. Image quality was fair.  Study completion:  There were no complications.         Transthoracic echocardiography.  M-mode, complete 2D, spectral Doppler, and color Doppler.  Birthdate:  Patient birthdate: July 19, 1950.  Age:  Patient is 68 yr old.  Sex:  Gender: male.    BMI: 13.4 kg/m^2.  Blood pressure:     103/64  Patient status:  Inpatient.  Study date:  Study date: 01/24/2017. Study time: 03:49 PM.  Location:  Bedside.  -------------------------------------------------------------------  ------------------------------------------------------------------- Left ventricle:  The cavity size was normal. There was hypertrophy of the apex. The appearance was consistent with apical variant hypertrophic cardiomyopathy. Systolic function was normal. The estimated ejection fraction was in the range of 55% to 60%. There was no dynamic obstruction. Wall motion was normal; there were  no regional wall motion abnormalities. Features are consistent with a pseudonormal left ventricular filling pattern, with concomitant abnormal relaxation and increased filling pressure (grade 2 diastolic dysfunction).  ------------------------------------------------------------------- Aortic valve:   Trileaflet; normal thickness leaflets. Mobility was not restricted.  Doppler:  Transvalvular velocity was within the normal range. There was no stenosis. There was mild regurgitation directed centrally in the LVOT.  ------------------------------------------------------------------- Aorta:  Aortic root: The aortic root was normal in size.  ------------------------------------------------------------------- Mitral valve:   Mildly thickened leaflets . Mobility was not restricted. There was no systolic anterior motion.  Doppler: Transvalvular velocity was within the normal range. There was no evidence for stenosis. There was mild to moderate  regurgitation directed eccentrically and posteriorly.  ------------------------------------------------------------------- Left atrium:  The atrium was mildly dilated.  ------------------------------------------------------------------- Right ventricle:  The cavity size was normal. Wall thickness was normal. Systolic function was normal.  ------------------------------------------------------------------- Pulmonic valve:    Structurally normal valve.   Cusp separation was normal.  Doppler:  Transvalvular velocity was within the normal range. There was no evidence for stenosis. There was trivial regurgitation.  ------------------------------------------------------------------- Tricuspid valve:   Structurally normal valve.    Doppler: Transvalvular velocity was within the normal range. There was mild-moderate regurgitation directed centrally.  ------------------------------------------------------------------- Pulmonary artery:   The  main pulmonary artery was normal-sized. Systolic pressure was mildly increased.  ------------------------------------------------------------------- Right atrium:  The atrium was mildly dilated.  ------------------------------------------------------------------- Pericardium:  There was no pericardial effusion.  ------------------------------------------------------------------- Systemic veins: Inferior vena cava: The vessel was dilated. The respirophasic diameter changes were blunted (< 50%), consistent with elevated central venous pressure.  ------------------------------------------------------------------- Measurements   Left ventricle                         Value        Reference  LV ID, ED, PLAX chordal        (L)     42.9  mm     43 - 52  LV ID, ES, PLAX chordal                29.5  mm     23 - 38  LV fx shortening, PLAX chordal         31    %      >=29  LV PW thickness, ED                    12.4  mm     ----------  IVS/LV PW ratio, ED                    0.89         <=1.3  Stroke volume, 2D                      62    ml     ----------  Stroke volume/bsa, 2D                  44    ml/m^2 ----------    Ventricular septum                     Value        Reference  IVS thickness, ED                      11    mm     ----------    LVOT                                   Value        Reference  LVOT ID, S                             23    mm     ----------  LVOT area                              4.15  cm^2   ----------  LVOT peak velocity, S                  73.2  cm/s   ----------  LVOT mean velocity, S                  42.6  cm/s   ----------  LVOT VTI, S                            14.9  cm     ----------    Aorta                                  Value        Reference  Aortic root ID, ED                     33    mm     ----------  Ascending aorta ID, A-P, S             32    mm     ----------    Left atrium                            Value        Reference  LA  ID, A-P, ES                         39    mm     ----------  LA ID/bsa, A-P                 (H)     2.75  cm/m^2 <=2.2  LA volume, S                           99.5  ml     ----------  LA volume/bsa, S                       70.2  ml/m^2 ----------  LA volume, ES, 1-p A4C                 79.4  ml     ----------  LA volume/bsa, ES, 1-p A4C             56    ml/m^2 ----------  LA volume, ES, 1-p A2C                 117   ml     ----------  LA volume/bsa, ES, 1-p A2C             82.6  ml/m^2 ----------    Pulmonary arteries                     Value        Reference  PA pressure, S, DP             (H)     36    mm Hg  <=30    Tricuspid valve                        Value        Reference  Tricuspid regurg peak velocity  288   cm/s   ----------  Tricuspid peak RV-RA gradient          33    mm Hg  ----------    Right atrium                           Value        Reference  RA ID, S-I, ES, A4C            (H)     58    mm     34 - 49  RA area, ES, A4C               (H)     23.9  cm^2   8.3 - 19.5  RA volume, ES, A/L                     80    ml     ----------  RA volume/bsa, ES, A/L                 56.5  ml/m^2 ----------    Systemic veins                         Value        Reference  Estimated CVP                          3     mm Hg  ----------    Right ventricle                        Value        Reference  TAPSE                                  20.7  mm     ----------  RV pressure, S, DP             (H)     36    mm Hg  <=30  RV s&', lateral, S                      9.37  cm/s   ----------  Legend: (L)  and  (H)  mark values outside specified reference range.  ------------------------------------------------------------------- Prepared and Electronically Authenticated by  Sanda Klein, MD 2018-09-15T17:13:39 [facility] Images   Show images for ECHOCARDIOGRAM COMPLETE Patient Information   Patient Name Ian Hill, Ian Hill Sex Male DOB 19-Jun-1950 SSN [id number] Reason for Exam  Priority: Routine  Comments:  Surgical History   Surgical History    No past medical history on file.  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 SISTER NOT SURE OF THIS Provider CARDIOVASCULAR STRESS TEST  11-19-2016 dr Stanford Breed Low risk nuclear study/ normal resting and stress perfusion with no ischemia or infarction/ EF not done due to PVCIs , TID borderline, 1.2 significant LVH Provider IR PERCUTANEOUS ART THROMBECTOMY/INFUSION INTRACRANIAL INC DIAG ANGIO  01/23/2017  Provider RADIOLOGY WITH ANESTHESIA N/A 01/23/2017 Procedure: RADIOLOGY WITH ANESTHESIA; Surgeon: Luanne Bras, MD; Location: [place]; Service: Radiology; Laterality: N/A; Provider TRANSTHORACIC ECHOCARDIOGRAM  11-20-2016  dr Stanford Breed mild LVH, apical hypertrophic cardiomyopathy, systolic function vigorous, ef 65-70%, grade 2  diastolic dysfunction/ mild AR, MR, and PR/ moderate LAE and RAE/ mild to mod. TR/ moderate increase PASP 6mmHg Provider TRANSURETHRAL RESECTION OF PROSTATE N/A 11/24/2016 Procedure: TRANSURETHRAL RESECTION OF THE PROSTATE (TURP); Surgeon: Franchot Gallo, MD; Location: [facility]; Service: Urology; Laterality: N/A; Provider  Patient Data   Height  70 in  BP  103/64 mmHg    Performing Technologist/Nurse   Performing Technologist/Nurse: Kenney Houseman, RDCS  2D Measurements    LV PW d  12.4 mm (Range: 0.6 - 1.1)  FS  31 % (Range: 28 - 44)  LA ID, A-P, ES  39 mm  LA diam end sys  39 mm  LA vol  99.5 mL  LA vol index  70.2 mL/m2  IVS/LV PW RATIO, ED  .89   FS  31 % (Range: 28 - 44)    Right Ventricle Measurements    Lateral S' vel  9.37 cm/sec  TAPSE  20.7 mm  RV sys press  36 mmHg    Aortic Valve Measurements    Stenosis LVOT diameter  23 mm  LVOT area  4.15  cm2  LVOT VTI  14.9 cm  LVOT peak vel  73.2 cm/s       Aortic Root Measurements - End Diastolic    Ao-asc  32 cm    Left Atrium Measurements    LA ID, A-P, ES  39 mm  LA vol  99.5 mL  LA vol index  70.2 mL/m2  LA vol A4C  79.4 ml  LVOT SV  62 mL     Tricuspid Valve Measurements    PISA TR max vel  288 cm/s        Implants    No active implants to display in this view. Order-Level Documents - 01/23/2017:   Scan on 01/23/2017 9:41 PM by Dennie Maizes on 01/23/2017 9:41 PM by Haywood Lasso Documents - 01/23/2017:   Scan on 02/16/2017 2:53 PM by Default, Provider, MDScan on 02/16/2017 2:53 PM by Default, Provider, MD  Scan on 02/16/2017 2:51 PM by Default, Provider, MDScan on 02/16/2017 2:51 PM by Default, Provider, MD  Scan on 01/28/2017 5:42 PM by Victory Dakin on 01/28/2017 5:42 PM by Lindwood Qua  Scan on 01/28/2017 5:42 PM by Victory Dakin on 01/28/2017 5:42 PM by Lindwood Qua  Scan on 01/28/2017 11:48 AM by Default, Provider, MDScan on 01/28/2017 11:48 AM by Default, Provider, MD  Document on 01/27/2017 10:05 PM by Leafy Kindle: ED PB Summary  Scan on 01/28/2017 10:36 AM by Default, Provider, MDScan on 01/28/2017 10:36 AM by Default, Provider, MD  Scan on 01/28/2017 9:08 AM by Default, Provider, MDScan on 01/28/2017 9:08 AM by Default, Provider, MD    Signed   Electronically signed by Sanda Klein, MD on 01/24/17 at 1714 EDT Printable Result Report    Result Report  External Result Report    External Result Report     Reproductive/Obstetrics                           Anesthesia Physical  Anesthesia Plan  ASA: III  Anesthesia Plan: General   Post-op Pain Management:    Induction:   PONV Risk Score and Plan: 2 and Ondansetron  Airway Management Planned: LMA  Additional Equipment: Arterial line  Intra-op Plan:   Post-operative Plan: Extubation in  OR  Informed Consent: I have reviewed the patients  History and Physical, chart, labs and discussed the procedure including the risks, benefits and alternatives for the proposed anesthesia with the patient or authorized representative who has indicated his/her understanding and acceptance.   Dental advisory given  Plan Discussed with: CRNA and Surgeon  Anesthesia Plan Comments:         Anesthesia Quick Evaluation  Anesthesia Physical Anesthesia Plan  ASA: III  Anesthesia Plan: General   Post-op Pain Management:    Induction: Intravenous  PONV Risk Score and Plan: Treatment may vary due to age or medical condition  Airway Management Planned: LMA and Oral ETT  Additional Equipment: None  Intra-op Plan:   Post-operative Plan: Extubation in OR  Informed Consent: I have reviewed the patients History and Physical, chart, labs and discussed the procedure including the risks, benefits and alternatives for the proposed anesthesia with the patient or authorized representative who has indicated his/her understanding and acceptance.     Consent reviewed with POA  Plan Discussed with:   Anesthesia Plan Comments: (See PAT note 12/08/2018, Konrad Felix, PA-C)      Anesthesia Quick Evaluation

## 2018-12-08 NOTE — Progress Notes (Addendum)
Anesthesia Chart Review   Case: [id number] Date/Time: 12/09/18 1345   Procedure: TRANSURETHRAL RESECTION OF BLADDER TUMOR WITH MITOMYCIN-C (N/A ) - 30 MINS   Anesthesia type: General   Pre-op diagnosis: BLADDER TUMOR   Location: [facility] OR ROOM 2 / [place]   Surgeon: Ian Gallo, MD      DISCUSSION:67 y.o. current some day smoker (22.5 pack years) with h/o HTN, CKD Stage II, hypertrophic obstructive cardiomyopathy with diastolic heart failure, A-fib (on Eliquis), CVA 01/2017 with residual left sided weakness, prostate cancer, bladder tumor scheduled for above procedure 12/09/2018 with Ian. Franchot Hill.   S/p gold seed implant 03/10/18.  No anesthesia complications noted. Prior to this procedure cleared by cardiology.  Per Ian Copa, PA-C, "Ian Hill was last seen on 06/2017 by Ian Hill. H/o HCM with NSVt on Holter but pt/family declined ICD, normal nuc 11/2016, mild carotid dz 2011, stroke, PAF, CKD II, HTN, HLD, BPH. Revised cardiac risk index calculated at 0.9% indicating low risk of cardiac complications. Previously cleared for TURP in 2018 by Ian. Stanford Hill after stable cardiac testing. Spoke with patient on the phone along with his sister who is listed on DPR. They both feel he has not had any interim change in cardiac status with new CP, SOB or syncope. Therefore, based on ACC/AHA guidelines, the patient would be at acceptable risk for the planned procedure without further cardiovascular testing."    Advised to hold Eliquis 48 hours prior to procedure.   Discussed with Ian. Tobias Hill.  Anticipate pt can proceed with planned procedure barring acute status change.   VS: Ht 6' (1.829 m)   Wt 57.6 kg   BMI 17.22 kg/m   PROVIDERS: Ian Hedges, NP is PCP   Ian Ruths, MD is Cardiologist  LABS: SDW (all labs ordered are listed, but only abnormal results are displayed)  Labs Reviewed - No data to display   IMAGES:   EKG:   CV: Echo  01/24/17 Study Conclusions  - Left ventricle: The cavity size was normal. There was hypertrophy   of the apex. The appearance was consistent with apical variant   hypertrophic cardiomyopathy. Systolic function was normal. The   estimated ejection fraction was in the range of 55% to 60%. There   was no dynamic obstruction. Wall motion was normal; there were no   regional wall motion abnormalities. Features are consistent with   a pseudonormal left ventricular filling pattern, with concomitant   abnormal relaxation and increased filling pressure (grade 2   diastolic dysfunction). - Aortic valve: There was mild regurgitation directed centrally in   the LVOT. - Mitral valve: There was mild to moderate regurgitation directed   eccentrically and posteriorly. - Left atrium: The atrium was mildly dilated. - Right atrium: The atrium was mildly dilated. - Tricuspid valve: There was mild-moderate regurgitation directed   centrally. - Pulmonary arteries: Systolic pressure was mildly increased. PA   peak pressure: 36 mm Hg (S).  Stress Test 11/19/16  There was no ST segment deviation noted during stress.  The study is normal.  This is a low risk study.   Normal resting and stress perfusion. No ischemia or infarction EF Not done due to PVCls TID borderline 1.2 Significant LVH Past Medical History:  Diagnosis Date  . Anticoagulant long-term use    eliquis  . Bladder tumor   . BPH (benign prostatic hyperplasia)   . CKD (chronic kidney disease), stage II   . Diverticulosis   . Dyslipidemia   .  History of CVA with residual deficit 01/28/2017   acute right MCA infarc with M2 embolic occlusion w/p  tPA and mechnical thrombectomy---  residual left side weakness  . History of external beam radiation therapy    03-23-2018  to 05-03-2018  prostate cancer  . Hypertension   . Hypertensive heart disease   . Hypertrophic cardiomegaly, apical variant   . Hypertrophic obstructive cardiomyopathy  with diastolic heart failure Ian Hill)    cardiologist-- Ian Ian Hill  . Mitral regurgitation   . Other problems related to education and literacy    can not read  . Prostate cancer [facility]) urologist-  Ian Hill/  oncologsit-- Ian Hill   dx 07/ 2018---  Stage T1a,  Gleason 4+3--- completed external radiation therapy 05-03-2018    Past Surgical History:  Procedure Laterality Date  . 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 CARDIOVASCULAR STRESS TEST  11-19-2016  Ian Ian Hill   Low risk nuclear study/  normal resting and stress perfusion with no ischemia or infarction/  EF not done due to PVCIs , TID borderline, 1.2 significant LVH  . GOLD SEED IMPLANT N/A 03/10/2018   Procedure: GOLD SEED IMPLANT;  Surgeon: Ian Gallo, MD;  Location: [facility];  Service: Urology;  Laterality: N/A;  . IR PERCUTANEOUS ART THROMBECTOMY/INFUSION INTRACRANIAL INC DIAG ANGIO  01/23/2017  . RADIOLOGY WITH ANESTHESIA N/A 01/23/2017   Procedure: RADIOLOGY WITH ANESTHESIA;  Surgeon: Ian Bras, MD;  Location: [place];  Service: Radiology;  Laterality: N/A;  . SPACE OAR INSTILLATION N/A 03/10/2018   Procedure: SPACE OAR INSTILLATION;  Surgeon: Ian Gallo, MD;  Location: [facility];  Service: Urology;  Laterality: N/A;  . TRANSTHORACIC ECHOCARDIOGRAM  11-20-2016   Ian Ian Hill   mild LVH,  apical hypertrophic cardiomyopathy, systolic function vigorous,  ef 65-70%,  grade 2 diastolic dysfunction/  mild AR, MR, and  PR/  moderate LAE and RAE/  mild to mod. TR/  moderate increase PASP 71mmHg  . TRANSURETHRAL RESECTION OF PROSTATE N/A 11/24/2016   Procedure: TRANSURETHRAL RESECTION OF THE PROSTATE (TURP);  Surgeon: Ian Gallo, MD;  Location: [facility];  Service: Urology;  Laterality: N/A;    MEDICATIONS: No current facility-administered medications for this encounter.    Ian Hill SALINE NASAL NA  . Calcium  Carbonate-Simethicone (TUMS GAS RELIEF CHEWY BITES PO)  . nicotine polacrilex (NICORETTE) 2 MG gum  . phenazopyridine (PYRIDIUM) 200 MG tablet  . polyethylene glycol (MIRALAX / GLYCOLAX) packet  . rosuvastatin (CRESTOR) 20 MG tablet  . tamsulosin (FLOMAX) 0.4 MG CAPS capsule  . apixaban (ELIQUIS) 5 MG TABS tablet  . atenolol (TENORMIN) 50 MG tablet  . Cholecalciferol (VITAMIN D) 2000 units CAPS  . feeding supplement, ENSURE ENLIVE, (ENSURE ENLIVE) LIQD  . finasteride (PROSCAR) 5 MG tablet    Maia Plan [facility] Pre-Surgical Testing [phone] 12/08/18  2:05 PM

## 2018-12-09 ENCOUNTER — Encounter (HOSPITAL_BASED_OUTPATIENT_CLINIC_OR_DEPARTMENT_OTHER): Payer: Self-pay

## 2018-12-09 ENCOUNTER — Ambulatory Visit (HOSPITAL_BASED_OUTPATIENT_CLINIC_OR_DEPARTMENT_OTHER): Payer: Medicare (Managed Care) | Admitting: Physician Assistant

## 2018-12-09 ENCOUNTER — Encounter (HOSPITAL_BASED_OUTPATIENT_CLINIC_OR_DEPARTMENT_OTHER)
Admission: RE | Disposition: A | Discharge status: Home / Self Care | Payer: Self-pay | Source: Home / Self Care | Attending: Urology

## 2018-12-09 ENCOUNTER — Ambulatory Visit (HOSPITAL_BASED_OUTPATIENT_CLINIC_OR_DEPARTMENT_OTHER)
Admission: RE | Admit: 2018-12-09 | Discharge: 2018-12-09 | Disposition: A | Discharge status: Home / Self Care | Payer: Medicare (Managed Care) | Attending: Urology | Admitting: Urology

## 2018-12-09 DIAGNOSIS — F17210 Nicotine dependence, cigarettes, uncomplicated: Secondary | ICD-10-CM | POA: Diagnosis not present

## 2018-12-09 DIAGNOSIS — I5032 Chronic diastolic (congestive) heart failure: Secondary | ICD-10-CM | POA: Insufficient documentation

## 2018-12-09 DIAGNOSIS — D090 Carcinoma in situ of bladder: Secondary | ICD-10-CM | POA: Diagnosis not present

## 2018-12-09 DIAGNOSIS — Z923 Personal history of irradiation: Secondary | ICD-10-CM | POA: Insufficient documentation

## 2018-12-09 DIAGNOSIS — Z8546 Personal history of malignant neoplasm of prostate: Secondary | ICD-10-CM | POA: Insufficient documentation

## 2018-12-09 DIAGNOSIS — C672 Malignant neoplasm of lateral wall of bladder: Secondary | ICD-10-CM

## 2018-12-09 DIAGNOSIS — I130 Hypertensive heart and chronic kidney disease with heart failure and stage 1 through stage 4 chronic kidney disease, or unspecified chronic kidney disease: Secondary | ICD-10-CM | POA: Diagnosis not present

## 2018-12-09 DIAGNOSIS — E785 Hyperlipidemia, unspecified: Secondary | ICD-10-CM | POA: Diagnosis not present

## 2018-12-09 DIAGNOSIS — I421 Obstructive hypertrophic cardiomyopathy: Secondary | ICD-10-CM | POA: Diagnosis not present

## 2018-12-09 DIAGNOSIS — N182 Chronic kidney disease, stage 2 (mild): Secondary | ICD-10-CM | POA: Insufficient documentation

## 2018-12-09 DIAGNOSIS — F79 Unspecified intellectual disabilities: Secondary | ICD-10-CM | POA: Insufficient documentation

## 2018-12-09 DIAGNOSIS — Z7901 Long term (current) use of anticoagulants: Secondary | ICD-10-CM | POA: Diagnosis not present

## 2018-12-09 DIAGNOSIS — D494 Neoplasm of unspecified behavior of bladder: Secondary | ICD-10-CM | POA: Diagnosis present

## 2018-12-09 DIAGNOSIS — I69354 Hemiplegia and hemiparesis following cerebral infarction affecting left non-dominant side: Secondary | ICD-10-CM | POA: Diagnosis not present

## 2018-12-09 DIAGNOSIS — Z79899 Other long term (current) drug therapy: Secondary | ICD-10-CM | POA: Insufficient documentation

## 2018-12-09 HISTORY — DX: Neoplasm of unspecified behavior of bladder: D49.4

## 2018-12-09 HISTORY — PX: TRANSURETHRAL RESECTION OF BLADDER TUMOR WITH MITOMYCIN-C: SHX6459

## 2018-12-09 HISTORY — DX: Long term (current) use of anticoagulants: Z79.01

## 2018-12-09 HISTORY — DX: Personal history of irradiation: Z92.3

## 2018-12-09 HISTORY — DX: Other problems related to education and literacy: Z55.8

## 2018-12-09 LAB — POCT I-STAT, CHEM 8
BUN: 27 mg/dL — ABNORMAL HIGH (ref 8–23)
Calcium, Ion: 1.25 mmol/L (ref 1.15–1.40)
Chloride: 107 mmol/L (ref 98–111)
Creatinine, Ser: 1.1 mg/dL (ref 0.61–1.24)
Glucose, Bld: 58 mg/dL — ABNORMAL LOW (ref 70–99)
HCT: 39 % (ref 39.0–52.0)
Hemoglobin: 13.3 g/dL (ref 13.0–17.0)
Potassium: 4.5 mmol/L (ref 3.5–5.1)
Sodium: 143 mmol/L (ref 135–145)
TCO2: 23 mmol/L (ref 22–32)

## 2018-12-09 SURGERY — TRANSURETHRAL RESECTION OF BLADDER TUMOR WITH MITOMYCIN-C
Anesthesia: General | Site: Renal

## 2018-12-09 MED ORDER — DEXAMETHASONE SODIUM PHOSPHATE 10 MG/ML IJ SOLN
INTRAMUSCULAR | Status: AC
  Filled 2018-12-09: qty 1

## 2018-12-09 MED ORDER — ONDANSETRON HCL 4 MG/2ML IJ SOLN
INTRAMUSCULAR | Status: AC
  Filled 2018-12-09: qty 2

## 2018-12-09 MED ORDER — GEMCITABINE CHEMO FOR BLADDER INSTILLATION 2000 MG
2000.0000 mg | Freq: Once | INTRAVENOUS | Status: AC
  Administered 2018-12-09: 16:00:00 2000 mg via INTRAVESICAL
  Filled 2018-12-09: qty 2000
  Filled 2018-12-09: qty 52.6

## 2018-12-09 MED ORDER — FENTANYL CITRATE (PF) 100 MCG/2ML IJ SOLN
25.0000 ug | INTRAMUSCULAR | Status: DC | PRN
  Administered 2018-12-09 (×2): 25 ug via INTRAVENOUS
  Administered 2018-12-09 (×2): 50 ug via INTRAVENOUS
  Filled 2018-12-09: qty 1

## 2018-12-09 MED ORDER — CEFAZOLIN SODIUM-DEXTROSE 2-4 GM/100ML-% IV SOLN
2.0000 g | INTRAVENOUS | Status: AC
  Administered 2018-12-09: 2 g via INTRAVENOUS
  Filled 2018-12-09: qty 100

## 2018-12-09 MED ORDER — PHENYLEPHRINE HCL (PRESSORS) 10 MG/ML IV SOLN
INTRAVENOUS | Status: DC | PRN
  Administered 2018-12-09: 80 ug via INTRAVENOUS

## 2018-12-09 MED ORDER — MIDAZOLAM HCL 2 MG/2ML IJ SOLN
INTRAMUSCULAR | Status: AC
  Filled 2018-12-09: qty 2

## 2018-12-09 MED ORDER — GLYCOPYRROLATE PF 0.2 MG/ML IJ SOSY
PREFILLED_SYRINGE | INTRAMUSCULAR | Status: AC
  Filled 2018-12-09: qty 1

## 2018-12-09 MED ORDER — GEMCITABINE CHEMO FOR BLADDER INSTILLATION 2000 MG: 2000.0000 mg | Freq: Once | INTRAVENOUS | Status: DC

## 2018-12-09 MED ORDER — SODIUM CHLORIDE 0.9 % IR SOLN
Status: DC | PRN
  Administered 2018-12-09 (×2): 3000 mL

## 2018-12-09 MED ORDER — LIDOCAINE 2% (20 MG/ML) 5 ML SYRINGE
INTRAMUSCULAR | Status: AC
  Filled 2018-12-09: qty 5

## 2018-12-09 MED ORDER — LIDOCAINE HCL (CARDIAC) PF 100 MG/5ML IV SOSY
PREFILLED_SYRINGE | INTRAVENOUS | Status: DC | PRN
  Administered 2018-12-09: 80 mg via INTRAVENOUS

## 2018-12-09 MED ORDER — FENTANYL CITRATE (PF) 100 MCG/2ML IJ SOLN
INTRAMUSCULAR | Status: AC
  Filled 2018-12-09: qty 2

## 2018-12-09 MED ORDER — PROPOFOL 10 MG/ML IV BOLUS
INTRAVENOUS | Status: DC | PRN
  Administered 2018-12-09: 50 mg via INTRAVENOUS
  Administered 2018-12-09: 100 mg via INTRAVENOUS

## 2018-12-09 MED ORDER — CEFAZOLIN SODIUM-DEXTROSE 2-4 GM/100ML-% IV SOLN
INTRAVENOUS | Status: AC
  Filled 2018-12-09: qty 100

## 2018-12-09 MED ORDER — OXYCODONE HCL 5 MG/5ML PO SOLN
5.0000 mg | Freq: Once | ORAL | Status: DC | PRN
  Filled 2018-12-09: qty 5

## 2018-12-09 MED ORDER — MEPERIDINE HCL 25 MG/ML IJ SOLN
6.2500 mg | INTRAMUSCULAR | Status: DC | PRN
  Filled 2018-12-09: qty 1

## 2018-12-09 MED ORDER — LACTATED RINGERS IV SOLN
INTRAVENOUS | Status: DC
  Administered 2018-12-09 (×2): via INTRAVENOUS
  Filled 2018-12-09: qty 1000

## 2018-12-09 MED ORDER — PROPOFOL 10 MG/ML IV BOLUS
INTRAVENOUS | Status: AC
  Filled 2018-12-09: qty 20

## 2018-12-09 MED ORDER — FENTANYL CITRATE (PF) 100 MCG/2ML IJ SOLN
INTRAMUSCULAR | Status: DC | PRN
  Administered 2018-12-09 (×2): 25 ug via INTRAVENOUS

## 2018-12-09 MED ORDER — OXYCODONE HCL 5 MG PO TABS
5.0000 mg | ORAL_TABLET | Freq: Once | ORAL | Status: DC | PRN
  Filled 2018-12-09: qty 1

## 2018-12-09 MED ORDER — GLYCOPYRROLATE 0.2 MG/ML IJ SOLN
INTRAMUSCULAR | Status: DC | PRN
  Administered 2018-12-09: 0.1 mg via INTRAVENOUS

## 2018-12-09 MED ORDER — ONDANSETRON HCL 4 MG/2ML IJ SOLN
INTRAMUSCULAR | Status: DC | PRN
  Administered 2018-12-09: 4 mg via INTRAVENOUS

## 2018-12-09 SURGICAL SUPPLY — 35 items
BAG DRAIN URO-CYSTO SKYTR STRL (DRAIN) ×3 IMPLANT
BAG URINE DRAINAGE (UROLOGICAL SUPPLIES) ×2 IMPLANT
BAG URINE LEG 19OZ MD ST LTX (BAG) IMPLANT
CATH FOLEY 2WAY SLVR  5CC 18FR (CATHETERS) ×2
CATH FOLEY 2WAY SLVR  5CC 20FR (CATHETERS)
CATH FOLEY 2WAY SLVR  5CC 22FR (CATHETERS)
CATH FOLEY 2WAY SLVR 5CC 18FR (CATHETERS) IMPLANT
CATH FOLEY 2WAY SLVR 5CC 20FR (CATHETERS) IMPLANT
CATH FOLEY 2WAY SLVR 5CC 22FR (CATHETERS) IMPLANT
CATH FOLEY 3WAY 30CC 22F (CATHETERS) IMPLANT
CATH INTERMIT  6FR 70CM (CATHETERS) ×2 IMPLANT
CLOTH BEACON ORANGE TIMEOUT ST (SAFETY) ×3 IMPLANT
ELECT REM PT RETURN 9FT ADLT (ELECTROSURGICAL)
ELECTRODE REM PT RTRN 9FT ADLT (ELECTROSURGICAL) ×1 IMPLANT
EVACUATOR MICROVAS BLADDER (UROLOGICAL SUPPLIES) IMPLANT
GLOVE BIO SURGEON STRL SZ 6.5 (GLOVE) ×1 IMPLANT
GLOVE BIO SURGEON STRL SZ8 (GLOVE) ×3 IMPLANT
GLOVE BIO SURGEONS STRL SZ 6.5 (GLOVE) ×1
GLOVE BIOGEL PI IND STRL 6.5 (GLOVE) IMPLANT
GLOVE BIOGEL PI INDICATOR 6.5 (GLOVE) ×2
GOWN STRL REUS W/TWL XL LVL3 (GOWN DISPOSABLE) ×5 IMPLANT
GUIDEWIRE STR DUAL SENSOR (WIRE) ×2 IMPLANT
HOLDER FOLEY CATH W/STRAP (MISCELLANEOUS) ×2 IMPLANT
IV NS IRRIG 3000ML ARTHROMATIC (IV SOLUTION) ×3 IMPLANT
KIT TURNOVER CYSTO (KITS) ×3 IMPLANT
LOOP CUT BIPOLAR 24F LRG (ELECTROSURGICAL) ×5 IMPLANT
MANIFOLD NEPTUNE II (INSTRUMENTS) ×3 IMPLANT
NS IRRIG 500ML POUR BTL (IV SOLUTION) ×3 IMPLANT
PACK CYSTO (CUSTOM PROCEDURE TRAY) ×3 IMPLANT
PLUG CATH AND CAP STER (CATHETERS) ×2 IMPLANT
SYRINGE IRR TOOMEY STRL 70CC (SYRINGE) ×3 IMPLANT
TUBE CONNECTING 12'X1/4 (SUCTIONS) ×1
TUBE CONNECTING 12X1/4 (SUCTIONS) ×2 IMPLANT
TUBING UROLOGY SET (TUBING) IMPLANT
WATER STERILE IRR 500ML POUR (IV SOLUTION) ×2 IMPLANT

## 2018-12-09 NOTE — Anesthesia Postprocedure Evaluation (Deleted)
Anesthesia Post Note  Patient: Ian Hill  Procedure(s) Performed: TRANSURETHRAL RESECTION OF BLADDER TUMOR WITH GEMCITABINE GIVEN IN PACU (N/A Renal)     Anesthesia Post Evaluation  Last Vitals:  Vitals:   12/09/18 1545 12/09/18 1600  BP: (!) 154/93 (!) 188/100  Pulse: 92 89  Resp: (!) 23 10  Temp:    SpO2: 100% 100%    Last Pain:  Vitals:   12/09/18 1600  TempSrc:   PainSc: 0-No pain                 Osric Klopf

## 2018-12-09 NOTE — Op Note (Signed)
Preoperative diagnosis: Left bladder wall tumor, probable urothelial carcinoma, 2 cm in size  Postoperative diagnosis: Same  Principal procedure: Cystoscopy, transurethral resection of 2 cm left bladder wall tumor, placement of 2 g gemcitabine intravesically postoperatively  Surgeon: Clement Deneault  Anesthesia: General with LMA  Complications: None  Specimen: Bladder tumor fragments, to pathology  Drains: 24 French Foley catheter  Indications: 68 year old male who has had external beam radiotherapy for prostate cancer.  Recent gross hematuria prompted cystoscopy which revealed a 2 cm left trigonal region papillary lesion, most likely urothelial carcinoma.  I discussed TURBT with the patient (he is mentally disabled) as well as a sister Gregary Signs, who is his power of attorney.  I have discussed the procedure, expected outcomes, risks and complications as well as placement of the gemcitabine.  They understand the procedure and desire to proceed.  Findings: Urethra was normal.  Prostate nonobstructive.  Urothelial lesion noted lateral to the left ureteral orifice, approximately 2 cm in size.  It was papillary in nature, fairly low-lying without any nodular elements.  The remaining urothelium was normal.  Ureteral orifice ease were normal in configuration location although somewhat obscured due to the patient having had prior prostate radiation.  Description of procedure: The patient was properly identified in the holding area.  He received preoperative IV antibiotics.  Was then taken to the operating room where general anesthetic was administered with the LMA.  He was placed in the dorsolithotomy position, genitalia and perineum were prepped and draped, proper timeout performed.  A 21 French panendoscope was advanced through the urethra and into the bladder with careful inspection performed.  It was difficult to recognize/locate the ureteral orifice on the left, but this was found fairly medial to the  patient's bladder tumor.  Following this, the 8 French resectoscope sheath was passed with the visual obturator.  The resectoscope and cutting loop were placed.  The tumor was resected down into the muscle layer with the cutting loop.  The entire tumor was removed.  The base and edges were cauterized with electrocautery.  The fragments were irrigated from the bladder.  They were passed off as "bladder tumor" to pathology for permanent pathology.  At this point, there being no bleeding, the scope was removed and a an 48 [place] Foley catheter placed in the balloon filled with 10 cc of water.  The bladder was fully drained and the catheter plug placed.  At this point, the patient was awakened, taken to the PACU in stable condition.  He tolerated the procedure well.  In the PACU, 2 g of gemcitabine was placed.  It would be left indwelling for 1 hour and then drained.

## 2018-12-09 NOTE — Transfer of Care (Signed)
  Last Vitals:  Vitals Value Taken Time  BP 144/86 12/09/18 1504  Temp    Pulse    Resp 19 12/09/18 1507  SpO2    Vitals shown include unvalidated device data.  Last Pain:  Vitals:   12/09/18 1312  TempSrc:   PainSc: 0-No pain      Patients Stated Pain Goal: 5 (12/09/18 1312) Immediate Anesthesia Transfer of Care Note  Patient: Ian Hill  Procedure(s) Performed: Procedure(s) (LRB): TRANSURETHRAL RESECTION OF BLADDER TUMOR WITH GEMCITABINE GIVEN IN PACU (N/A)  Patient Location: PACU  Anesthesia Type: General  Level of Consciousness: awake, alert  and oriented  Airway & Oxygen Therapy: Patient Spontanous Breathing and Patient connected to nasal cannula oxygen  Post-op Assessment: Report given to PACU RN and Post -op Vital signs reviewed and stable  Post vital signs: Reviewed and stable  Complications: No apparent anesthesia complications

## 2018-12-09 NOTE — Addendum Note (Signed)
Addendum  created 12/09/18 1615 by Oleta Mouse, MD   Clinical Note Signed, Delete clinical note

## 2018-12-09 NOTE — H&P (Signed)
H&P  Chief Complaint: Bladder cancer  History of Present Illness: 68 year old male status post radiotherapy for intermediate/high risk prostate cancer.  He has had recent gross hematuria and cystoscopy revealed a left sided bladder tumor.  He presents at this time for TURBT and placement of gemcitabine postoperatively.  Past Medical History:  Diagnosis Date  . Anticoagulant long-term use    eliquis  . Bladder tumor   . BPH (benign prostatic hyperplasia)   . CKD (chronic kidney disease), stage II   . Diverticulosis   . Dyslipidemia   . History of CVA with residual deficit 01/28/2017   acute right MCA infarc with M2 embolic occlusion w/p  tPA and mechnical thrombectomy---  residual left side weakness  . History of external beam radiation therapy    03-23-2018  to 05-03-2018  prostate cancer  . Hypertension   . Hypertensive heart disease   . Hypertrophic cardiomegaly, apical variant   . Hypertrophic obstructive cardiomyopathy with diastolic heart failure [facility])    cardiologist-- dr Stanford Breed  . Mitral regurgitation   . Other problems related to education and literacy    can not read  . Prostate cancer [facility]) urologist-  dr Johaan Ryser/  oncologsit-- dr Tammi Klippel   dx 07/ 2018---  Stage T1a,  Gleason 4+3--- completed external radiation therapy 05-03-2018    Past Surgical History:  Procedure Laterality Date  . 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 CARDIOVASCULAR STRESS TEST  11-19-2016  dr Stanford Breed   Low risk nuclear study/  normal resting and stress perfusion with no ischemia or infarction/  EF not done due to PVCIs , TID borderline, 1.2 significant LVH  . GOLD SEED IMPLANT N/A 03/10/2018   Procedure: GOLD SEED IMPLANT;  Surgeon: Franchot Gallo, MD;  Location: [facility];  Service: Urology;  Laterality: N/A;  . IR PERCUTANEOUS ART THROMBECTOMY/INFUSION INTRACRANIAL INC DIAG ANGIO  01/23/2017  . RADIOLOGY WITH ANESTHESIA N/A  01/23/2017   Procedure: RADIOLOGY WITH ANESTHESIA;  Surgeon: Luanne Bras, MD;  Location: [facility];  Service: Radiology;  Laterality: N/A;  . SPACE OAR INSTILLATION N/A 03/10/2018   Procedure: SPACE OAR INSTILLATION;  Surgeon: Franchot Gallo, MD;  Location: [facility];  Service: Urology;  Laterality: N/A;  . TRANSTHORACIC ECHOCARDIOGRAM  11-20-2016   dr Stanford Breed   mild LVH,  apical hypertrophic cardiomyopathy, systolic function vigorous,  ef 65-70%,  grade 2 diastolic dysfunction/  mild AR, MR, and  PR/  moderate LAE and RAE/  mild to mod. TR/  moderate increase PASP 52mmHg  . TRANSURETHRAL RESECTION OF PROSTATE N/A 11/24/2016   Procedure: TRANSURETHRAL RESECTION OF THE PROSTATE (TURP);  Surgeon: Franchot Gallo, MD;  Location: [facility];  Service: Urology;  Laterality: N/A;    Home Medications:    Allergies: No Known Allergies  Family History  Problem Relation Age of Onset  . Cancer Paternal Aunt        unknown    Social History:  reports that he has been smoking cigarettes. He has a 25.50 pack-year smoking history. He has never used smokeless tobacco. He reports that he does not drink alcohol or use drugs.  ROS: A complete review of systems was performed.  All systems are negative except for pertinent findings as noted.  Physical Exam:  Vital signs in last 24 hours: Temp:  [98.7 F (37.1 C)] 98.7 F (37.1 C) (07/30 1201) Pulse Rate:  [87] 87 (07/30 1201) Resp:  [  14] 14 (07/30 1201) BP: (157)/(85) 157/85 (07/30 1201) SpO2:  [98 %] 98 % (07/30 1201) Weight:  [54.1 kg] 54.1 kg (07/30 1201) Constitutional:  Alert and oriented, No acute distress Cardiovascular: Regular rate  Respiratory: Normal respiratory effort GI: Abdomen is soft, nontender, nondistended, no abdominal masses. No CVAT.  Genitourinary: Normal male phallus, testes are descended bilaterally and non-tender and without masses, scrotum is normal in appearance without lesions  or masses, perineum is normal on inspection. Lymphatic: No lymphadenopathy Neurologic: Grossly intact, no focal deficits Psychiatric: Normal mood and affect  Laboratory Data:  Recent Labs    12/09/18 1335  HGB 13.3  HCT 39.0    Recent Labs    12/09/18 1335  NA 143  K 4.5  CL 107  GLUCOSE 58*  BUN 27*  CREATININE 1.10     Results for orders placed or performed during the hospital encounter of 12/09/18 (from the past 24 hour(s))  I-STAT, chem 8     Status: Abnormal   Collection Time: 12/09/18  1:35 PM  Result Value Ref Range   Sodium 143 135 - 145 mmol/L   Potassium 4.5 3.5 - 5.1 mmol/L   Chloride 107 98 - 111 mmol/L   BUN 27 (H) 8 - 23 mg/dL   Creatinine, Ser 1.10 0.61 - 1.24 mg/dL   Glucose, Bld 58 (L) 70 - 99 mg/dL   Calcium, Ion 1.25 1.15 - 1.40 mmol/L   TCO2 23 22 - 32 mmol/L   Hemoglobin 13.3 13.0 - 17.0 g/dL   HCT 39.0 39.0 - 52.0 %   Recent Results (from the past 240 hour(s))  SARS Coronavirus 2 (Performed in [place] hospital lab)     Status: None   Collection Time: 12/06/18 11:17 AM   Specimen: Nasal Swab  Result Value Ref Range Status   SARS Coronavirus 2 NEGATIVE NEGATIVE Final    Comment: (NOTE) SARS-CoV-2 target nucleic acids are NOT DETECTED. The SARS-CoV-2 RNA is generally detectable in upper and lower respiratory specimens during the acute phase of infection. Negative results do not preclude SARS-CoV-2 infection, do not rule out co-infections with other pathogens, and should not be used as the sole basis for treatment or other patient management decisions. Negative results must be combined with clinical observations, patient history, and epidemiological information. The expected result is Negative. Fact Sheet for Patients: SugarRoll.be Fact Sheet for Healthcare Providers: https://www.woods-mathews.com/ This test is not yet approved or cleared by the [place] FDA and  has been authorized for  detection and/or diagnosis of SARS-CoV-2 by FDA under an Emergency Use Authorization (EUA). This EUA will remain  in effect (meaning this test can be used) for the duration of the COVID-19 declaration under Section 56 4(b)(1) of the Act, 21 U.S.C. section 360bbb-3(b)(1), unless the authorization is terminated or revoked sooner. Performed at [facility], [place] [address], [place], [place] [address]     Renal Function: Recent Labs    12/09/18 1335  CREATININE 1.10   Estimated Creatinine Clearance: 48.5 mL/min (by C-G formula based on SCr of 1.1 mg/dL).  Radiologic Imaging: No results found.  Impression/Assessment:  Left bladder wall tumor  Plan:  TURBT, placement of intravesical gemcitabine

## 2018-12-09 NOTE — Discharge Instructions (Signed)
1. You may see some blood in the urine and may have some burning with urination for 48-72 hours. You also may notice that you have to urinate more frequently or urgently after your procedure which is normal.  2. You should call should you develop an inability urinate, fever > 101, persistent nausea and vomiting that prevents you from eating or drinking to stay hydrated.  3. If you have a catheter, you will be taught how to take care of the catheter by the nursing staff prior to discharge from the hospital.  You may periodically feel a strong urge to void with the catheter in place.  This is a bladder spasm and most often can occur when having a bowel movement or moving around. It is typically self-limited and usually will stop after a few minutes.  You may use some Vaseline or Neosporin around the tip of the catheter to reduce friction at the tip of the penis. You may also see some blood in the urine.  A very small amount of blood can make the urine look quite red.  As long as the catheter is draining well, there usually is not a problem.  However, if the catheter is not draining well and is bloody, you should call the office [phone]) to notify us.   Post Anesthesia Home Care Instructions  Activity: Get plenty of rest for the remainder of the day. A responsible individual must stay with you for 24 hours following the procedure.  For the next 24 hours, DO NOT: -Drive a car -Paediatric nurse -Drink alcoholic beverages -Take any medication unless instructed by your physician -Make any legal decisions or sign important papers.  Meals: Start with liquid foods such as gelatin or soup. Progress to regular foods as tolerated. Avoid greasy, spicy, heavy foods. If nausea and/or vomiting occur, drink only clear liquids until the nausea and/or vomiting subsides. Call your physician if vomiting continues.  Special Instructions/Symptoms: Your throat may feel dry or sore from the anesthesia or the  breathing tube placed in your throat during surgery. If this causes discomfort, gargle with warm salt water. The discomfort should disappear within 24 hours.

## 2018-12-09 NOTE — Anesthesia Procedure Notes (Signed)
Procedure Name: LMA Insertion Date/Time: 12/09/2018 2:28 PM Performed by: Bufford Spikes, CRNA Pre-anesthesia Checklist: Patient identified, Emergency Drugs available, Suction available and Patient being monitored Patient Re-evaluated:Patient Re-evaluated prior to induction Oxygen Delivery Method: Circle system utilized Preoxygenation: Pre-oxygenation with 100% oxygen Induction Type: IV induction Ventilation: Mask ventilation without difficulty LMA: LMA inserted LMA Size: 4.0 Number of attempts: 1 Airway Equipment and Method: Bite block Placement Confirmation: positive ETCO2 Tube secured with: Tape Dental Injury: Teeth and Oropharynx as per pre-operative assessment

## 2018-12-09 NOTE — Anesthesia Postprocedure Evaluation (Signed)
Anesthesia Post Note  Patient: Ian Hill  Procedure(s) Performed: TRANSURETHRAL RESECTION OF BLADDER TUMOR WITH GEMCITABINE GIVEN IN PACU (N/A Renal)     Patient location during evaluation: PACU Anesthesia Type: General Level of consciousness: awake and patient cooperative Pain management: pain level controlled Vital Signs Assessment: post-procedure vital signs reviewed and stable Respiratory status: spontaneous breathing, nonlabored ventilation, respiratory function stable and patient connected to nasal cannula oxygen Cardiovascular status: blood pressure returned to baseline and stable Postop Assessment: no apparent nausea or vomiting Anesthetic complications: no    Last Vitals:  Vitals:   12/09/18 1545 12/09/18 1600  BP: (!) 154/93 (!) 188/100  Pulse: 92 89  Resp: (!) 23 10  Temp:    SpO2: 100% 100%    Last Pain:  Vitals:   12/09/18 1600  TempSrc:   PainSc: 0-No pain                 Garik Diamant

## 2018-12-10 ENCOUNTER — Encounter (HOSPITAL_BASED_OUTPATIENT_CLINIC_OR_DEPARTMENT_OTHER): Payer: Self-pay | Admitting: Urology

## 2019-02-17 ENCOUNTER — Ambulatory Visit: Payer: Medicare (Managed Care) | Admitting: Cardiology

## 2019-02-23 ENCOUNTER — Other Ambulatory Visit (HOSPITAL_COMMUNITY): Payer: Self-pay | Admitting: *Deleted

## 2019-02-24 ENCOUNTER — Ambulatory Visit (HOSPITAL_COMMUNITY)
Admission: RE | Admit: 2019-02-24 | Discharge: 2019-02-24 | Disposition: A | Discharge status: Home / Self Care | Payer: Medicare (Managed Care) | Source: Ambulatory Visit | Attending: Gerontology | Admitting: Gerontology

## 2019-02-24 ENCOUNTER — Other Ambulatory Visit: Payer: Self-pay

## 2019-02-24 DIAGNOSIS — D500 Iron deficiency anemia secondary to blood loss (chronic): Secondary | ICD-10-CM | POA: Insufficient documentation

## 2019-02-24 DIAGNOSIS — D509 Iron deficiency anemia, unspecified: Secondary | ICD-10-CM | POA: Diagnosis present

## 2019-02-24 MED ORDER — SODIUM CHLORIDE 0.9 % IV SOLN
510.0000 mg | INTRAVENOUS | Status: DC
  Administered 2019-02-24: 13:00:00 510 mg via INTRAVENOUS
  Filled 2019-02-24: qty 17

## 2019-02-24 NOTE — Discharge Instructions (Signed)

## 2019-02-24 NOTE — Progress Notes (Signed)
Spoke with patient's sisiter about him saying he has been "peeing blood".  According to patient's sister he has been seen by Dr. Vernie Shanks.

## 2019-03-03 ENCOUNTER — Telehealth: Payer: Self-pay | Admitting: Cardiology

## 2019-03-03 ENCOUNTER — Ambulatory Visit (INDEPENDENT_AMBULATORY_CARE_PROVIDER_SITE_OTHER): Payer: Medicare (Managed Care) | Admitting: Cardiology

## 2019-03-03 ENCOUNTER — Encounter: Payer: Self-pay | Admitting: Cardiology

## 2019-03-03 ENCOUNTER — Other Ambulatory Visit: Payer: Self-pay

## 2019-03-03 VITALS — BP 116/60 | HR 72 | Ht 72.0 in | Wt 118.4 lb

## 2019-03-03 DIAGNOSIS — R310 Gross hematuria: Secondary | ICD-10-CM

## 2019-03-03 DIAGNOSIS — I480 Paroxysmal atrial fibrillation: Secondary | ICD-10-CM | POA: Diagnosis not present

## 2019-03-03 DIAGNOSIS — C61 Malignant neoplasm of prostate: Secondary | ICD-10-CM

## 2019-03-03 DIAGNOSIS — I10 Essential (primary) hypertension: Secondary | ICD-10-CM | POA: Diagnosis not present

## 2019-03-03 DIAGNOSIS — Z8673 Personal history of transient ischemic attack (TIA), and cerebral infarction without residual deficits: Secondary | ICD-10-CM

## 2019-03-03 DIAGNOSIS — I340 Nonrheumatic mitral (valve) insufficiency: Secondary | ICD-10-CM

## 2019-03-03 DIAGNOSIS — I421 Obstructive hypertrophic cardiomyopathy: Secondary | ICD-10-CM

## 2019-03-03 DIAGNOSIS — R319 Hematuria, unspecified: Secondary | ICD-10-CM | POA: Insufficient documentation

## 2019-03-03 NOTE — Assessment & Plan Note (Signed)
[facility] CVA 2018- TPA followed by surgery

## 2019-03-03 NOTE — Assessment & Plan Note (Signed)
Normal LVF with apical variant hypertrophy, grade 2 DD-Sept 2018

## 2019-03-03 NOTE — Patient Instructions (Signed)
Medication Instructions:  HOLD ELIQUIS FOR 5 (FIVE) DAYS  RESUME IF URINE CLEARS UP BUT IF URINE DOES NOT CLEAR UP OR YOU CONTINUE TO HAVE BLEEDING PLEASE CONTACT OUR OFFICE *If you need a refill on your cardiac medications before your next appointment, please call your pharmacy*  Lab Work: NONE  If you have labs (blood work) drawn today and your tests are completely normal, you will receive your results only by: Marland Kitchen MyChart Message (if you have MyChart) OR . A paper copy in the mail If you have any lab test that is abnormal or we need to change your treatment, we will call you to review the results.  Testing/Procedures: NONE   Follow-Up: At [facility], you and your health needs are our priority.  As part of our continuing mission to provide you with exceptional heart care, we have created designated Provider Care Teams.  These Care Teams include your primary Cardiologist (physician) and Advanced Practice Providers (APPs -  Physician Assistants and Nurse Practitioners) who all work together to provide you with the care you need, when you need it.  Your next appointment:   6 months  The format for your next appointment:   In Person  Provider:   Kirk Ruths, MD  Other Instructions

## 2019-03-03 NOTE — Assessment & Plan Note (Signed)
EKG today looks like flutter, he was in AF in July 2020

## 2019-03-03 NOTE — Progress Notes (Signed)
Cardiology Office Note:    Date:  03/03/2019   ID:  Ian Hill, DOB 1951-03-26, MRN [id number]  PCP:  Linda Hedges, NP  Cardiologist:  Kirk Ruths, MD  Electrophysiologist:  None   Referring MD: Linda Hedges, NP   C/o gross hematuria  History of Present Illness:    Ian Hill is a 68 y.o. male with a hx of HCM.  He had NSVT in the past.  ICD was discussed but the pt's family ultimatly declined (the patient has some degree of cognitive impairment).    The patient had a R MCA stroke Sept 2018 treated with TPA followed by surgical revascularization of an occluded right MCA. Cardiology was consulted 01/24/17 for + troponin and abnormal EKG. Also with runs of NSVT on telemetry and documented PAF.  Echo Sept 2018 showed apical variant hypertrophic cardiomyopathy with grade 2 diastolic dysfunction, mild aortic insufficiency, and mild to moderate mitral regurgitation.  The patient has been maintained on Eliquis and atenolol.  He has not been seen in the office by Dr. Stanford Breed since October 2018.  He was cleared for prostate cancer surgery and TUR back in October 2019 with Dr. Diona Fanti.  The patient had another procedure in July of this year with Dr. Diona Fanti.  The patient tells me that he initially had some hematuria but it cleared up but now has recurred.  He describes gross dark red hematuria with each voiding.  Past Medical History:  Diagnosis Date  . Anticoagulant long-term use    eliquis  . Bladder tumor   . BPH (benign prostatic hyperplasia)   . CKD (chronic kidney disease), stage II   . Diverticulosis   . Dyslipidemia   . History of CVA with residual deficit 01/28/2017   acute right MCA infarc with M2 embolic occlusion w/p  tPA and mechnical thrombectomy---  residual left side weakness  . History of external beam radiation therapy    03-23-2018  to 05-03-2018  prostate cancer  . Hypertension   . Hypertensive heart disease   . Hypertrophic cardiomegaly,  apical variant   . Hypertrophic obstructive cardiomyopathy with diastolic heart failure [facility])    cardiologist-- dr Stanford Breed  . Mitral regurgitation   . Other problems related to education and literacy    can not read  . Prostate cancer [facility]) urologist-  dr dahlstedt/  oncologsit-- dr Tammi Klippel   dx 07/ 2018---  Stage T1a,  Gleason 4+3--- completed external radiation therapy 05-03-2018    Past Surgical History:  Procedure Laterality Date  . 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 CARDIOVASCULAR STRESS TEST  11-19-2016  dr Stanford Breed   Low risk nuclear study/  normal resting and stress perfusion with no ischemia or infarction/  EF not done due to PVCIs , TID borderline, 1.2 significant LVH  . GOLD SEED IMPLANT N/A 03/10/2018   Procedure: GOLD SEED IMPLANT;  Surgeon: Franchot Gallo, MD;  Location: [facility];  Service: Urology;  Laterality: N/A;  . IR PERCUTANEOUS ART THROMBECTOMY/INFUSION INTRACRANIAL INC DIAG ANGIO  01/23/2017  . RADIOLOGY WITH ANESTHESIA N/A 01/23/2017   Procedure: RADIOLOGY WITH ANESTHESIA;  Surgeon: Luanne Bras, MD;  Location: [place];  Service: Radiology;  Laterality: N/A;  . SPACE OAR INSTILLATION N/A 03/10/2018   Procedure: SPACE OAR INSTILLATION;  Surgeon: Franchot Gallo, MD;  Location: [facility];  Service: Urology;  Laterality: N/A;  . TRANSTHORACIC ECHOCARDIOGRAM  11-20-2016   dr Stanford Breed  mild LVH,  apical hypertrophic cardiomyopathy, systolic function vigorous,  ef 65-70%,  grade 2 diastolic dysfunction/  mild AR, MR, and  PR/  moderate LAE and RAE/  mild to mod. TR/  moderate increase PASP 30mmHg  . TRANSURETHRAL RESECTION OF BLADDER TUMOR WITH MITOMYCIN-C N/A 12/09/2018   Procedure: TRANSURETHRAL RESECTION OF BLADDER TUMOR WITH GEMCITABINE GIVEN IN PACU;  Surgeon: Franchot Gallo, MD;  Location: [facility];  Service: Urology;  Laterality: N/A;  . TRANSURETHRAL  RESECTION OF PROSTATE N/A 11/24/2016   Procedure: TRANSURETHRAL RESECTION OF THE PROSTATE (TURP);  Surgeon: Franchot Gallo, MD;  Location: [facility];  Service: Urology;  Laterality: N/A;    Current Medications: Current Meds  Medication Sig  . apixaban (ELIQUIS) 5 MG TABS tablet Take 1 tablet (5 mg total) by mouth 2 (two) times daily.  Marland Kitchen atenolol (TENORMIN) 50 MG tablet TAKE 1 TABLET (50 MG TOTAL) BY MOUTH EVERY MORNING.  [name] SALINE NASAL NA Place 2 sprays into the nose 4 (four) times daily as needed.  . Calcium Carbonate-Simethicone (TUMS GAS RELIEF CHEWY BITES PO) Take by mouth as needed.  . Cholecalciferol (VITAMIN D) 2000 units CAPS Take 2,000 Units by mouth daily.  . feeding supplement, ENSURE ENLIVE, (ENSURE ENLIVE) LIQD Take 237 mLs by mouth 3 (three) times daily.  . finasteride (PROSCAR) 5 MG tablet Take 1 tablet (5 mg total) by mouth daily.  . nicotine polacrilex (NICORETTE) 2 MG gum Take 2 mg by mouth as needed for smoking cessation.  . phenazopyridine (PYRIDIUM) 200 MG tablet Take 200 mg by mouth 2 (two) times daily as needed for pain.  . polyethylene glycol (MIRALAX / GLYCOLAX) packet Take 17 g by mouth daily as needed.  . rosuvastatin (CRESTOR) 20 MG tablet Take 20 mg by mouth at bedtime.  . tamsulosin (FLOMAX) 0.4 MG CAPS capsule Take 1 capsule (0.4 mg total) by mouth daily.     Allergies:   Patient has no known allergies.   Social History   Socioeconomic History  . Marital status: Single    Spouse name: Not on file  . Number of children: Not on file  . Years of education: Not on file  . Highest education level: Not on file  Occupational History    Comment: disabled  Social Needs  . Financial resource strain: Not on file  . Food insecurity    Worry: Not on file    Inability: Not on file  . Transportation needs    Medical: Not on file    Non-medical: Not on file  Tobacco Use  . Smoking status: Current Some Day Smoker    Packs/day: 0.50     Years: 51.00    Pack years: 25.50    Types: Cigarettes  . Smokeless tobacco: Never Used  Substance and Sexual Activity  . Alcohol use: No    Frequency: Never  . Drug use: No  . Sexual activity: Not Currently  Lifestyle  . Physical activity    Days per week: Not on file    Minutes per session: Not on file  . Stress: Not on file  Relationships  . Social Herbalist on phone: Not on file    Gets together: Not on file    Attends religious service: Not on file    Active member of club or organization: Not on file    Attends meetings of clubs or organizations: Not on file    Relationship status: Not on file  Other Topics Concern  . Not on file  Social History Narrative   ** Merged History Encounter **      Resides with sister, Osvaldo Angst         Family History: The patient's family history includes Cancer in his paternal aunt.  ROS:   Please see the history of present illness.     All other systems reviewed and are negative.  EKGs/Labs/Other Studies Reviewed:    The following studies were reviewed today: Echo sept 2018  EKG:  EKG is ordered today.  The ekg ordered today demonstrates what appears to be atrial flutter with VR 72.  Previous EKG July 2020 clearly showed atrial fibrillation with CVR. He also has LVH with marked TW changes  Recent Labs: 12/09/2018: BUN 27; Creatinine, Ser 1.10; Hemoglobin 13.3; Potassium 4.5; Sodium 143  Recent Lipid Panel    Component Value Date/Time   CHOL 135 01/24/2017 0604   TRIG 93 01/24/2017 0604   HDL 48 01/24/2017 0604   CHOLHDL 2.8 01/24/2017 0604   VLDL 19 01/24/2017 0604   LDLCALC 68 01/24/2017 0604    Physical Exam:    VS:  BP 116/60   Pulse 72   Ht 6' (1.829 m)   Wt 118 lb 6.4 oz (53.7 kg)   BMI 16.06 kg/m     Wt Readings from Last 3 Encounters:  03/03/19 118 lb 6.4 oz (53.7 kg)  02/24/19 122 lb 4.8 oz (55.5 kg)  12/09/18 119 lb 4.8 oz (54.1 kg)     GEN: Thin AA male, well developed in no acute  distress HEENT: Normal NECK: No JVD; No carotid bruits LYMPHATICS: No lymphadenopathy CARDIAC: RRR, 2/6 MR murmur, no rubs, gallops RESPIRATORY:  Clear to auscultation without rales, wheezing or rhonchi  ABDOMEN: Soft, non-tender, non-distended MUSCULOSKELETAL:  No edema; No deformity  SKIN: Warm and dry NEUROLOGIC:  Alert and oriented x 3 PSYCHIATRIC:  Normal affect   ASSESSMENT:    Hematuria Will hold Eliquis for 5 days. If hematuria resumes after this we may need to consider stopping it though he would be high risk for recurrent stroke   Hypertrophic obstructive cardiomyopathy (HCC) Normal LVF with apical variant hypertrophy, grade 2 DD-Sept 2018  PAF (paroxysmal atrial fibrillation) ([place]) EKG today looks like flutter, he was in AF in July 2020  Essential hypertension Controlled  History of CVA (cerebrovascular accident) RMCA CVA 2018- TPA followed by surgery  Prostate cancer [facility]) Surgery July 2018, Oct 2019, July 2020  Mitral regurgitation Moderate- asymptomatic.   PLAN:    Pt is having gross hematuria. I feel we have to hold his Eliquis for a few days to see inf this clears.  If not he may need to stop anticoagulation but would be high risk for recurrent stroke.    Medication Adjustments/Labs and Tests Ordered: Current medicines are reviewed at length with the patient today.  Concerns regarding medicines are outlined above.  Orders Placed This Encounter  Procedures  . EKG 12-Lead   No orders of the defined types were placed in this encounter.   Patient Instructions  Medication Instructions:  HOLD ELIQUIS FOR 5 (FIVE) DAYS  RESUME IF URINE CLEARS UP BUT IF URINE DOES NOT CLEAR UP OR YOU CONTINUE TO HAVE BLEEDING PLEASE CONTACT OUR OFFICE *If you need a refill on your cardiac medications before your next appointment, please call your pharmacy*  Lab Work: NONE  If you have labs (blood work) drawn today and your tests are completely normal, you will  receive  your results only by: Marland Kitchen MyChart Message (if you have MyChart) OR . A paper copy in the mail If you have any lab test that is abnormal or we need to change your treatment, we will call you to review the results.  Testing/Procedures: NONE   Follow-Up: At [facility], you and your health needs are our priority.  As part of our continuing mission to provide you with exceptional heart care, we have created designated Provider Care Teams.  These Care Teams include your primary Cardiologist (physician) and Advanced Practice Providers (APPs -  Physician Assistants and Nurse Practitioners) who all work together to provide you with the care you need, when you need it.  Your next appointment:   6 months  The format for your next appointment:   In Person  Provider:   Kirk Ruths, MD  Other Instructions     Signed, Kerin Ransom, PA-C  03/03/2019 12:21 PM    [place]

## 2019-03-03 NOTE — Assessment & Plan Note (Signed)
Will hold Eliquis for 5 days. If hematuria resumes after this we may need to consider stopping it though he would be high risk for recurrent stroke

## 2019-03-03 NOTE — Assessment & Plan Note (Signed)
Surgery July 2018, Oct 2019, July 2020

## 2019-03-03 NOTE — Assessment & Plan Note (Signed)
Controlled.  

## 2019-03-03 NOTE — Assessment & Plan Note (Signed)
Moderate- asymptomatic.

## 2019-03-03 NOTE — Telephone Encounter (Signed)
I tried to call the patient's sister on cell and home number- left message on home number.  The patient doesn't really know why he is in the office today.  Kerin Ransom PA-C 03/03/2019 11:46 AM

## 2019-03-08 ENCOUNTER — Emergency Department (HOSPITAL_COMMUNITY): Payer: Medicare (Managed Care)

## 2019-03-08 ENCOUNTER — Other Ambulatory Visit: Payer: Self-pay

## 2019-03-08 ENCOUNTER — Encounter (HOSPITAL_COMMUNITY)
Admission: RE | Admit: 2019-03-08 | Discharge: 2019-03-08 | Disposition: A | Discharge status: Home / Self Care | Payer: Medicare (Managed Care) | Source: Ambulatory Visit | Attending: Gerontology | Admitting: Gerontology

## 2019-03-08 ENCOUNTER — Inpatient Hospital Stay (HOSPITAL_COMMUNITY)
Admission: EM | Admit: 2019-03-08 | Discharge: 2019-03-14 | DRG: 915 | Disposition: A | Discharge status: Home Health | Payer: Medicare (Managed Care) | Source: Other Acute Inpatient Hospital | Attending: Internal Medicine | Admitting: Internal Medicine

## 2019-03-08 DIAGNOSIS — E785 Hyperlipidemia, unspecified: Secondary | ICD-10-CM | POA: Diagnosis present

## 2019-03-08 DIAGNOSIS — I083 Combined rheumatic disorders of mitral, aortic and tricuspid valves: Secondary | ICD-10-CM | POA: Diagnosis present

## 2019-03-08 DIAGNOSIS — Z79899 Other long term (current) drug therapy: Secondary | ICD-10-CM

## 2019-03-08 DIAGNOSIS — I472 Ventricular tachycardia: Secondary | ICD-10-CM | POA: Diagnosis not present

## 2019-03-08 DIAGNOSIS — I130 Hypertensive heart and chronic kidney disease with heart failure and stage 1 through stage 4 chronic kidney disease, or unspecified chronic kidney disease: Secondary | ICD-10-CM | POA: Diagnosis present

## 2019-03-08 DIAGNOSIS — Y848 Other medical procedures as the cause of abnormal reaction of the patient, or of later complication, without mention of misadventure at the time of the procedure: Secondary | ICD-10-CM | POA: Diagnosis present

## 2019-03-08 DIAGNOSIS — Z9079 Acquired absence of other genital organ(s): Secondary | ICD-10-CM

## 2019-03-08 DIAGNOSIS — N400 Enlarged prostate without lower urinary tract symptoms: Secondary | ICD-10-CM | POA: Diagnosis present

## 2019-03-08 DIAGNOSIS — I361 Nonrheumatic tricuspid (valve) insufficiency: Secondary | ICD-10-CM | POA: Diagnosis not present

## 2019-03-08 DIAGNOSIS — Z906 Acquired absence of other parts of urinary tract: Secondary | ICD-10-CM

## 2019-03-08 DIAGNOSIS — F0390 Unspecified dementia without behavioral disturbance: Secondary | ICD-10-CM | POA: Diagnosis present

## 2019-03-08 DIAGNOSIS — I4892 Unspecified atrial flutter: Secondary | ICD-10-CM | POA: Diagnosis not present

## 2019-03-08 DIAGNOSIS — T782XXA Anaphylactic shock, unspecified, initial encounter: Secondary | ICD-10-CM | POA: Diagnosis not present

## 2019-03-08 DIAGNOSIS — D72819 Decreased white blood cell count, unspecified: Secondary | ICD-10-CM | POA: Diagnosis not present

## 2019-03-08 DIAGNOSIS — I469 Cardiac arrest, cause unspecified: Secondary | ICD-10-CM | POA: Diagnosis not present

## 2019-03-08 DIAGNOSIS — I421 Obstructive hypertrophic cardiomyopathy: Secondary | ICD-10-CM | POA: Diagnosis present

## 2019-03-08 DIAGNOSIS — R000 Tachycardia, unspecified: Secondary | ICD-10-CM | POA: Diagnosis not present

## 2019-03-08 DIAGNOSIS — I340 Nonrheumatic mitral (valve) insufficiency: Secondary | ICD-10-CM | POA: Diagnosis not present

## 2019-03-08 DIAGNOSIS — I959 Hypotension, unspecified: Secondary | ICD-10-CM

## 2019-03-08 DIAGNOSIS — R451 Restlessness and agitation: Secondary | ICD-10-CM | POA: Diagnosis not present

## 2019-03-08 DIAGNOSIS — T886XXA Anaphylactic reaction due to adverse effect of correct drug or medicament properly administered, initial encounter: Principal | ICD-10-CM | POA: Diagnosis present

## 2019-03-08 DIAGNOSIS — Z20828 Contact with and (suspected) exposure to other viral communicable diseases: Secondary | ICD-10-CM | POA: Diagnosis present

## 2019-03-08 DIAGNOSIS — I5032 Chronic diastolic (congestive) heart failure: Secondary | ICD-10-CM | POA: Diagnosis present

## 2019-03-08 DIAGNOSIS — T454X5A Adverse effect of iron and its compounds, initial encounter: Secondary | ICD-10-CM | POA: Diagnosis present

## 2019-03-08 DIAGNOSIS — K0889 Other specified disorders of teeth and supporting structures: Secondary | ICD-10-CM | POA: Diagnosis present

## 2019-03-08 DIAGNOSIS — I69319 Unspecified symptoms and signs involving cognitive functions following cerebral infarction: Secondary | ICD-10-CM | POA: Diagnosis not present

## 2019-03-08 DIAGNOSIS — N179 Acute kidney failure, unspecified: Secondary | ICD-10-CM | POA: Diagnosis not present

## 2019-03-08 DIAGNOSIS — Z8546 Personal history of malignant neoplasm of prostate: Secondary | ICD-10-CM

## 2019-03-08 DIAGNOSIS — I272 Pulmonary hypertension, unspecified: Secondary | ICD-10-CM | POA: Diagnosis present

## 2019-03-08 DIAGNOSIS — N182 Chronic kidney disease, stage 2 (mild): Secondary | ICD-10-CM | POA: Diagnosis present

## 2019-03-08 DIAGNOSIS — D500 Iron deficiency anemia secondary to blood loss (chronic): Secondary | ICD-10-CM | POA: Diagnosis not present

## 2019-03-08 DIAGNOSIS — R069 Unspecified abnormalities of breathing: Secondary | ICD-10-CM

## 2019-03-08 DIAGNOSIS — Z923 Personal history of irradiation: Secondary | ICD-10-CM

## 2019-03-08 DIAGNOSIS — I442 Atrioventricular block, complete: Secondary | ICD-10-CM | POA: Diagnosis not present

## 2019-03-08 DIAGNOSIS — I480 Paroxysmal atrial fibrillation: Secondary | ICD-10-CM | POA: Diagnosis present

## 2019-03-08 DIAGNOSIS — R001 Bradycardia, unspecified: Secondary | ICD-10-CM | POA: Diagnosis not present

## 2019-03-08 DIAGNOSIS — F17210 Nicotine dependence, cigarettes, uncomplicated: Secondary | ICD-10-CM | POA: Diagnosis present

## 2019-03-08 DIAGNOSIS — D649 Anemia, unspecified: Secondary | ICD-10-CM | POA: Diagnosis not present

## 2019-03-08 DIAGNOSIS — E872 Acidosis: Secondary | ICD-10-CM | POA: Diagnosis present

## 2019-03-08 DIAGNOSIS — D509 Iron deficiency anemia, unspecified: Secondary | ICD-10-CM | POA: Diagnosis present

## 2019-03-08 DIAGNOSIS — I952 Hypotension due to drugs: Secondary | ICD-10-CM | POA: Diagnosis not present

## 2019-03-08 DIAGNOSIS — I499 Cardiac arrhythmia, unspecified: Secondary | ICD-10-CM | POA: Diagnosis not present

## 2019-03-08 DIAGNOSIS — Z7901 Long term (current) use of anticoagulants: Secondary | ICD-10-CM

## 2019-03-08 DIAGNOSIS — N183 Chronic kidney disease, stage 3 unspecified: Secondary | ICD-10-CM | POA: Diagnosis not present

## 2019-03-08 DIAGNOSIS — R7989 Other specified abnormal findings of blood chemistry: Secondary | ICD-10-CM | POA: Diagnosis not present

## 2019-03-08 DIAGNOSIS — I69354 Hemiplegia and hemiparesis following cerebral infarction affecting left non-dominant side: Secondary | ICD-10-CM | POA: Diagnosis not present

## 2019-03-08 DIAGNOSIS — R579 Shock, unspecified: Secondary | ICD-10-CM | POA: Diagnosis not present

## 2019-03-08 DIAGNOSIS — I422 Other hypertrophic cardiomyopathy: Secondary | ICD-10-CM | POA: Diagnosis not present

## 2019-03-08 DIAGNOSIS — Z781 Physical restraint status: Secondary | ICD-10-CM

## 2019-03-08 DIAGNOSIS — N189 Chronic kidney disease, unspecified: Secondary | ICD-10-CM | POA: Diagnosis not present

## 2019-03-08 LAB — CBC
HCT: 33.6 % — ABNORMAL LOW (ref 39.0–52.0)
Hemoglobin: 10.1 g/dL — ABNORMAL LOW (ref 13.0–17.0)
MCH: 26.9 pg (ref 26.0–34.0)
MCHC: 30.1 g/dL (ref 30.0–36.0)
MCV: 89.4 fL (ref 80.0–100.0)
Platelets: 132 10*3/uL — ABNORMAL LOW (ref 150–400)
RBC: 3.76 MIL/uL — ABNORMAL LOW (ref 4.22–5.81)
RDW: 25.5 % — ABNORMAL HIGH (ref 11.5–15.5)
WBC: 2.8 10*3/uL — ABNORMAL LOW (ref 4.0–10.5)
nRBC: 1.8 % — ABNORMAL HIGH (ref 0.0–0.2)

## 2019-03-08 LAB — HIV ANTIBODY (ROUTINE TESTING W REFLEX): HIV Screen 4th Generation wRfx: NONREACTIVE

## 2019-03-08 LAB — COMPREHENSIVE METABOLIC PANEL
ALT: 21 U/L (ref 0–44)
AST: 33 U/L (ref 15–41)
Albumin: 2.7 g/dL — ABNORMAL LOW (ref 3.5–5.0)
Alkaline Phosphatase: 98 U/L (ref 38–126)
Anion gap: 8 (ref 5–15)
BUN: 19 mg/dL (ref 8–23)
CO2: 21 mmol/L — ABNORMAL LOW (ref 22–32)
Calcium: 8.4 mg/dL — ABNORMAL LOW (ref 8.9–10.3)
Chloride: 110 mmol/L (ref 98–111)
Creatinine, Ser: 1.5 mg/dL — ABNORMAL HIGH (ref 0.61–1.24)
GFR calc Af Amer: 54 mL/min — ABNORMAL LOW (ref >60)
GFR calc non Af Amer: 47 mL/min — ABNORMAL LOW (ref >60)
Glucose, Bld: 117 mg/dL — ABNORMAL HIGH (ref 70–99)
Potassium: 4.3 mmol/L (ref 3.5–5.1)
Sodium: 139 mmol/L (ref 135–145)
Total Bilirubin: 1.1 mg/dL (ref 0.3–1.2)
Total Protein: 5.3 g/dL — ABNORMAL LOW (ref 6.5–8.1)

## 2019-03-08 LAB — TROPONIN I (HIGH SENSITIVITY): Troponin I (High Sensitivity): 166 ng/L (ref <18)

## 2019-03-08 LAB — LACTIC ACID, PLASMA
Lactic Acid, Venous: 4.5 mmol/L (ref 0.5–1.9)
Lactic Acid, Venous: 2.8 mmol/L (ref 0.5–1.9)
Lactic Acid, Venous: 2.1 mmol/L (ref 0.5–1.9)

## 2019-03-08 LAB — GLUCOSE, CAPILLARY
Glucose-Capillary: 89 mg/dL (ref 70–99)
Glucose-Capillary: 89 mg/dL (ref 70–99)

## 2019-03-08 LAB — MRSA PCR SCREENING: MRSA by PCR: NEGATIVE

## 2019-03-08 LAB — SARS CORONAVIRUS 2 (TAT 6-24 HRS): SARS Coronavirus 2: NEGATIVE

## 2019-03-08 MED ORDER — SODIUM CHLORIDE 0.9 % IV SOLN
Freq: Once | INTRAVENOUS | Status: AC
  Administered 2019-03-08: 14:00:00 via INTRAVENOUS

## 2019-03-08 MED ORDER — ROSUVASTATIN CALCIUM 5 MG PO TABS
10.0000 mg | ORAL_TABLET | Freq: Every day | ORAL | Status: DC
  Administered 2019-03-08 – 2019-03-13 (×6): 10 mg via ORAL
  Filled 2019-03-08 (×6): qty 2

## 2019-03-08 MED ORDER — FINASTERIDE 5 MG PO TABS
5.0000 mg | ORAL_TABLET | Freq: Every day | ORAL | Status: DC
  Administered 2019-03-08 – 2019-03-14 (×7): 5 mg via ORAL
  Filled 2019-03-08 (×8): qty 1

## 2019-03-08 MED ORDER — ONDANSETRON HCL 4 MG/2ML IJ SOLN
INTRAMUSCULAR | Status: AC
  Filled 2019-03-08: qty 2

## 2019-03-08 MED ORDER — SODIUM CHLORIDE 0.9 % IV BOLUS
1000.0000 mL | Freq: Once | INTRAVENOUS | Status: AC
  Administered 2019-03-08: 13:00:00 1000 mL via INTRAVENOUS

## 2019-03-08 MED ORDER — POLYETHYLENE GLYCOL 3350 17 G PO PACK: 17.0000 g | PACK | Freq: Every day | ORAL | Status: DC | PRN

## 2019-03-08 MED ORDER — APIXABAN 5 MG PO TABS
5.0000 mg | ORAL_TABLET | Freq: Two times a day (BID) | ORAL | Status: DC
  Administered 2019-03-08 – 2019-03-10 (×5): 5 mg via ORAL
  Filled 2019-03-08 (×6): qty 1

## 2019-03-08 MED ORDER — SODIUM CHLORIDE 0.9 % IV BOLUS
1000.0000 mL | Freq: Once | INTRAVENOUS | Status: AC
  Administered 2019-03-08: 12:00:00 1000 mL via INTRAVENOUS

## 2019-03-08 MED ORDER — EPINEPHRINE PF 1 MG/ML IJ SOLN
INTRAMUSCULAR | Status: AC
  Filled 2019-03-08: qty 3

## 2019-03-08 MED ORDER — EPINEPHRINE 0.3 MG/0.3ML IJ SOAJ
INTRAMUSCULAR | Status: AC
  Administered 2019-03-08: 10:00:00
  Filled 2019-03-08: qty 0.3

## 2019-03-08 MED ORDER — SODIUM CHLORIDE 0.9 % IV SOLN
510.0000 mg | INTRAVENOUS | Status: DC
  Administered 2019-03-08: 510 mg via INTRAVENOUS
  Filled 2019-03-08: qty 510

## 2019-03-08 MED ORDER — HEPARIN SODIUM (PORCINE) 5000 UNIT/ML IJ SOLN: 5000.0000 [IU] | Freq: Three times a day (TID) | INTRAMUSCULAR | Status: DC

## 2019-03-08 MED ORDER — METHYLPREDNISOLONE SODIUM SUCC 125 MG IJ SOLR
60.0000 mg | Freq: Four times a day (QID) | INTRAMUSCULAR | Status: DC
  Administered 2019-03-08 – 2019-03-09 (×3): 60 mg via INTRAVENOUS
  Filled 2019-03-08 (×3): qty 2

## 2019-03-08 MED ORDER — ONDANSETRON HCL 4 MG/2ML IJ SOLN
4.0000 mg | Freq: Once | INTRAMUSCULAR | Status: AC
  Administered 2019-03-08: 10:00:00 4 mg via INTRAVENOUS

## 2019-03-08 MED ORDER — EPINEPHRINE 0.3 MG/0.3ML IJ SOAJ
INTRAMUSCULAR | Status: AC
  Administered 2019-03-08: 15:00:00 1 mg
  Filled 2019-03-08: qty 0.3

## 2019-03-08 MED ORDER — DIPHENHYDRAMINE HCL 50 MG/ML IJ SOLN
12.5000 mg | Freq: Four times a day (QID) | INTRAMUSCULAR | Status: DC
  Administered 2019-03-08 – 2019-03-09 (×3): 12.5 mg via INTRAVENOUS
  Filled 2019-03-08 (×3): qty 1

## 2019-03-08 MED ORDER — VITAMIN D 25 MCG (1000 UNIT) PO TABS
2000.0000 [IU] | ORAL_TABLET | Freq: Every day | ORAL | Status: DC
  Administered 2019-03-09 – 2019-03-14 (×6): 2000 [IU] via ORAL
  Filled 2019-03-08 (×6): qty 2

## 2019-03-08 MED ORDER — METHYLPREDNISOLONE SODIUM SUCC 125 MG IJ SOLR
120.0000 mg | Freq: Once | INTRAMUSCULAR | Status: AC
  Administered 2019-03-08: 10:00:00 120 mg via INTRAVENOUS

## 2019-03-08 MED ORDER — NOREPINEPHRINE 4 MG/250ML-% IV SOLN: 2.0000 ug/min | INTRAVENOUS | Status: DC

## 2019-03-08 MED ORDER — PHENYLEPHRINE HCL-NACL 10-0.9 MG/250ML-% IV SOLN
25.0000 ug/min | INTRAVENOUS | Status: DC
  Administered 2019-03-08: 17:00:00 20 ug/min via INTRAVENOUS
  Administered 2019-03-09: 01:00:00 25 ug/min via INTRAVENOUS
  Filled 2019-03-08: qty 250

## 2019-03-08 MED ORDER — FAMOTIDINE IN NACL 20-0.9 MG/50ML-% IV SOLN
20.0000 mg | INTRAVENOUS | Status: DC
  Administered 2019-03-08: 20 mg via INTRAVENOUS
  Filled 2019-03-08: qty 50

## 2019-03-08 MED ORDER — SODIUM CHLORIDE 0.9 % IV SOLN
250.0000 mL | INTRAVENOUS | Status: DC
  Administered 2019-03-08: 250 mL via INTRAVENOUS

## 2019-03-08 MED ORDER — METHYLPREDNISOLONE SODIUM SUCC 125 MG IJ SOLR
INTRAMUSCULAR | Status: AC
  Administered 2019-03-08: 120 mg via INTRAVENOUS
  Filled 2019-03-08: qty 2

## 2019-03-08 NOTE — Progress Notes (Signed)
CRITICAL VALUE ALERT  Critical Value:  166 Troponin I  Date & Time Notied:  10/27  2100  Provider Notified: Dr. Genevive Bi  Orders Received/Actions taken: awaiting orders

## 2019-03-08 NOTE — ED Provider Notes (Signed)
[facility] Emergency Department Provider Note MRN:  [id number]  Arrival date & time: 03/08/19     Chief Complaint   Hypotension History of Present Illness   Ian Hill is a 68 y.o. year-old male with a history of hypertension, CKD, HOCM presenting to the ED with chief complaint of hypertension.  Patient arriving from iron infusion, where he suddenly developed nausea, vomiting, diarrhea, hypotension, general malaise.  Given Solu-Medrol prior to arrival.  Review of Systems  Positive for hypertension, nausea, vomiting, diarrhea.  Patient's Health History    Past Medical History:  Diagnosis Date  . Anticoagulant long-term use    eliquis  . Bladder tumor   . BPH (benign prostatic hyperplasia)   . CKD (chronic kidney disease), stage II   . Diverticulosis   . Dyslipidemia   . History of CVA with residual deficit 01/28/2017   acute right MCA infarc with M2 embolic occlusion w/p  tPA and mechnical thrombectomy---  residual left side weakness  . History of external beam radiation therapy    03-23-2018  to 05-03-2018  prostate cancer  . Hypertension   . Hypertensive heart disease   . Hypertrophic cardiomegaly, apical variant   . Hypertrophic obstructive cardiomyopathy with diastolic heart failure [facility])    cardiologist-- dr Stanford Breed  . Mitral regurgitation   . Other problems related to education and literacy    can not read  . Prostate cancer [facility]) urologist-  dr dahlstedt/  oncologsit-- dr Tammi Klippel   dx 07/ 2018---  Stage T1a,  Gleason 4+3--- completed external radiation therapy 05-03-2018    Past Surgical History:  Procedure Laterality Date  . 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 CARDIOVASCULAR STRESS TEST  11-19-2016  dr Stanford Breed   Low risk nuclear study/  normal resting and stress perfusion with no ischemia or infarction/  EF not done due to PVCIs , TID borderline, 1.2 significant LVH  . GOLD SEED  IMPLANT N/A 03/10/2018   Procedure: GOLD SEED IMPLANT;  Surgeon: Franchot Gallo, MD;  Location: [facility];  Service: Urology;  Laterality: N/A;  . IR PERCUTANEOUS ART THROMBECTOMY/INFUSION INTRACRANIAL INC DIAG ANGIO  01/23/2017  . RADIOLOGY WITH ANESTHESIA N/A 01/23/2017   Procedure: RADIOLOGY WITH ANESTHESIA;  Surgeon: Luanne Bras, MD;  Location: [place];  Service: Radiology;  Laterality: N/A;  . SPACE OAR INSTILLATION N/A 03/10/2018   Procedure: SPACE OAR INSTILLATION;  Surgeon: Franchot Gallo, MD;  [facility];  Service: Urology;  Laterality: N/A;  . TRANSTHORACIC ECHOCARDIOGRAM  11-20-2016   dr Stanford Breed   mild LVH,  apical hypertrophic cardiomyopathy, systolic function vigorous,  ef 65-70%,  grade 2 diastolic dysfunction/  mild AR, MR, and  PR/  moderate LAE and RAE/  mild to mod. TR/  moderate increase PASP 71mmHg  . TRANSURETHRAL RESECTION OF BLADDER TUMOR WITH MITOMYCIN-C N/A 12/09/2018   Procedure: TRANSURETHRAL RESECTION OF BLADDER TUMOR WITH GEMCITABINE GIVEN IN PACU;  Surgeon: Franchot Gallo, MD;  Location: [facility];  Service: Urology;  Laterality: N/A;  . TRANSURETHRAL RESECTION OF PROSTATE N/A 11/24/2016   Procedure: TRANSURETHRAL RESECTION OF THE PROSTATE (TURP);  Surgeon: Franchot Gallo, MD;  Location: [facility];  Service: Urology;  Laterality: N/A;    Family History  Problem Relation Age of Onset  . Cancer Paternal Aunt        unknown    Social History   Socioeconomic History  .  Marital status: Single    Spouse name: Not on file  . Number of children: Not on file  . Years of education: Not on file  . Highest education level: Not on file  Occupational History    Comment: disabled  Social Needs  . Financial resource strain: Not on file  . Food insecurity    Worry: Not on file    Inability: Not on file  . Transportation needs    Medical: Not on file    Non-medical: Not on  file  Tobacco Use  . Smoking status: Current Some Day Smoker    Packs/day: 0.50    Years: 51.00    Pack years: 25.50    Types: Cigarettes  . Smokeless tobacco: Never Used  Substance and Sexual Activity  . Alcohol use: No    Frequency: Never  . Drug use: No  . Sexual activity: Not Currently  Lifestyle  . Physical activity    Days per week: Not on file    Minutes per session: Not on file  . Stress: Not on file  Relationships  . Social Herbalist on phone: Not on file    Gets together: Not on file    Attends religious service: Not on file    Active member of club or organization: Not on file    Attends meetings of clubs or organizations: Not on file    Relationship status: Not on file  . Intimate partner violence    Fear of current or ex partner: Not on file    Emotionally abused: Not on file    Physically abused: Not on file    Forced sexual activity: Not on file  Other Topics Concern  . Not on file  Social History Narrative   ** Merged History Encounter **      Resides with sister, Osvaldo Angst         Physical Exam  Vital Signs and Nursing Notes reviewed Vitals:   03/08/19 1445 03/08/19 1454  BP: (!) 79/56 (!) 91/54  Pulse:    Resp: (!) 23 20  Temp:    SpO2:      CONSTITUTIONAL: Ill-appearing, in moderate distress due to discomfort NEURO:  Alert and oriented x 3, no focal deficits EYES:  eyes equal and reactive ENT/NECK:  no LAD, no JVD CARDIO: Regular rate, well-perfused, normal S1 and S2 PULM:  CTAB no wheezing or rhonchi, tachypneic GI/GU:  normal bowel sounds, non-distended, non-tender MSK/SPINE:  No gross deformities, no edema SKIN:  no rash, atraumatic PSYCH:  Appropriate speech and behavior  Diagnostic and Interventional Summary    EKG Interpretation  Date/Time:  Tuesday March 08 2019 10:44:10 EDT Ventricular Rate:  99 PR Interval:    QRS Duration: 93 QT Interval:  350 QTC Calculation: 450 R Axis:   55 Text Interpretation:  Atrial fibrillation RSR' in V1 or V2, probably normal variant Repol abnrm suggests ischemia, anterolateral Artifact in lead(s) V1 V4 V5 Confirmed by Gerlene Fee [phone]) on 03/08/2019 3:23:09 PM      Labs Reviewed  CBC - Abnormal; Notable for the following components:      Result Value   WBC 2.8 (*)    RBC 3.76 (*)    Hemoglobin 10.1 (*)    HCT 33.6 (*)    RDW 25.5 (*)    Platelets 132 (*)    nRBC 1.8 (*)    All other components within normal limits  COMPREHENSIVE METABOLIC PANEL - Abnormal; Notable for the  following components:   CO2 21 (*)    Glucose, Bld 117 (*)    Creatinine, Ser 1.50 (*)    Calcium 8.4 (*)    Total Protein 5.3 (*)    Albumin 2.7 (*)    GFR calc non Af Amer 47 (*)    GFR calc Af Amer 54 (*)    All other components within normal limits  LACTIC ACID, PLASMA - Abnormal; Notable for the following components:   Lactic Acid, Venous 4.5 (*)    All other components within normal limits  LACTIC ACID, PLASMA - Abnormal; Notable for the following components:   Lactic Acid, Venous 2.8 (*)    All other components within normal limits  SARS CORONAVIRUS 2 (TAT 6-24 HRS)    DG Chest Port 1 View  Final Result      Medications  EPINEPHrine (EPI-PEN) 0.3 mg/0.3 mL injection (  Given 03/08/19 1025)  sodium chloride 0.9 % bolus 1,000 mL (0 mLs Intravenous Stopped 03/08/19 1157)  sodium chloride 0.9 % bolus 1,000 mL (0 mLs Intravenous Stopped 03/08/19 1403)  EPINEPHrine (EPI-PEN) 0.3 mg/0.3 mL injection (1 mg  Given 03/08/19 1446)  0.9 %  sodium chloride infusion ( Intravenous New Bag/Given 03/08/19 1404)     Procedures  /  Critical Care Ultrasound ED Peripheral IV (Provider)  Date/Time: 03/08/2019 11:58 AM Performed by: Maudie Flakes, MD Authorized by: Maudie Flakes, MD   Procedure details:    Indications: multiple failed IV attempts     Skin Prep: chlorhexidine gluconate     Location: Right brachial vein.   Angiocath:  20 G   Bedside Ultrasound Guided:  Yes     Patient tolerated procedure without complications: Yes     Dressing applied: Yes    .Critical Care Performed by: Maudie Flakes, MD Authorized by: Maudie Flakes, MD   Critical care provider statement:    Critical care time (minutes):  45   Critical care time was exclusive of:  Separately billable procedures and treating other patients   Critical care was necessary to treat or prevent imminent or life-threatening deterioration of the following conditions:  Circulatory failure and shock (Anaphylaxis)   Critical care was time spent personally by me on the following activities:  Discussions with consultants, evaluation of patient's response to treatment, examination of patient, ordering and performing treatments and interventions, ordering and review of laboratory studies, ordering and review of radiographic studies, pulse oximetry, re-evaluation of patient's condition, obtaining history from patient or surrogate and review of old charts    ED Course and Medical Decision Making  I have reviewed the triage vital signs and the nursing notes.  Pertinent labs & imaging results that were available during my care of the patient were reviewed by me and considered in my medical decision making (see below for details).  Concern for anaphylaxis, profoundly hypotensive on arrival to the emergency department with blood pressure in the [id number] systolic, no radial pulse.  Patient with continued GI disturbance, no evident rash, protecting airway, no stridor.  Provided intramuscular epinephrine with significant improvement in clinical condition and blood pressure.  We will continue to monitor.  Patient continued to appear well clinically.  His blood pressure improved after first dose of intramuscular epinephrine but then began to decrease again.  Blood pressure is consistently in the 80s and now 70s.  Given additional dose of intramuscular epinephrine with improvement.  Will admit to intensivist service  for close monitoring.  Barth Kirks. Sedonia Small, MD  [facility] Emergency Medicine [place] [email or web address]  Final Clinical Impressions(s) / ED Diagnoses     ICD-10-CM   1. Anaphylactic shock  T78.2XXA   2. Hypotension  I95.9 DG Chest Port 1 View    DG Chest Port 1 View    ED Discharge Orders    None      Discharge Instructions Discussed with and Provided to Patient: Discharge Instructions   None       Maudie Flakes, MD 03/08/19 1529

## 2019-03-08 NOTE — H&P (Signed)
NAME:  Ian Hill, MRN:  [id number], DOB:  05/29/1950, LOS: 0 ADMISSION DATE:  03/08/2019, CONSULTATION DATE:  10/27 REFERRING MD:  Sedonia Small, CHIEF COMPLAINT:   Anaphylaxis   Brief History   68 year old male patient who is in the outpatient clinic receiving Feraheme developed acute hypotension, nausea vomiting and altered mental status was admitted with working diagnosis of anaphylaxis on 10/27.  History of present illness   68 year old male patient with a history of hypertension, HOCM, CKD and iron deficiency anemia.  He was a [name] on 10/27 for second dose of Feraheme.  After receiving almost his entire dose on 10/27 he developed acute nausea, vomiting, decreased mental status and systolic blood pressure down into the 40s.  He was immediately administered fluid volume challenge, placed in Trendelenburg position, given Solu-Medrol supplemental oxygen and transferred to the emergency room with a working diagnosis of anaphylaxis.  He received 2 doses of intramuscular epinephrine with improved mental status and overall blood pressure however he continued to remain hypotensive intermittently and because of this critical care was asked to admit.  Past Medical History  HOCM, CKD stage II,Hypertension, prior MCA stroke 2018, prior nonsustained ventricular tachycardia. Chronic diastolic heart failure.  Mitral regurg.  History of prostate cancer. Significant Hospital Events   10/27 admitted  Consults:  Not applicable  Procedures:    Significant Diagnostic Tests:    Micro Data:    Antimicrobials:     Interim history/subjective:  Feels much better  Objective   Blood pressure (Abnormal) 86/48, pulse 95, temperature (Abnormal) 97 F (36.1 C), temperature source Axillary, resp. rate 20, SpO2 95 %.        Intake/Output Summary (Last 24 hours) at 03/08/2019 1532 Last data filed at 03/08/2019 1403 Gross per 24 hour  Intake 2000 ml  Output no documentation   Net 2000 ml   There were no vitals filed for this visit.  Examination: General: Pleasant 68 year old male patient currently resting in bed he is in no acute distress on exam HENT: Normocephalic atraumatic no jugular venous distention mucous membranes moist Lungs: Clear to auscultation Cardiovascular: Regular rate and rhythm Abdomen: Soft nontender Extremities: Warm dry brisk cap refill Neuro: Awake alert moves all extremities GU: Voids  Resolved Hospital Problem list    Assessment & Plan:   Circulatory shock.  Presumed anaphylaxis secondary to allergic reaction to [facility].  Hemostasis briefly improves with epinephrine then slowly trends back down Denies chest pain or shortness of breath no upper airway stridor or wheezing Plan Admit to the intensive care Peripheral norepinephrine Start scheduled Solu-Medrol and H2 blockade List Feraheme as allergy Hold all home antihypertensives Continue crystalloid resuscitation  History of hypertrophic cardiomyopathy with diastolic heart dysfunction, paroxysmal atrial fibrillation Plan Continuing telemetry monitoring Holding antihypertensives Continue Eliquis  CKD D stage II, with mild elevated serum creatinine suggesting acute on chronic injury Plan Mean arterial pressure goal greater than 65 Renal dose medications Strict intake and output A.m. chemistry   Mild post shock lactic acidosis I do not think this is due to sepsis Plan Repeat lactate  prior CVA.  Does have cognitive delay Plan Supportive care We will needed notify [facility]   Best practice:  Diet: npo Pain/Anxiety/Delirium protocol (if indicated): na VAP protocol (if indicated): na DVT prophylaxis: eliquis  GI prophylaxis: na Glucose control: na Mobility: br Code Status: full code  Family Communication: pending  Disposition: admit to ICU   Labs   CBC: Recent Labs  Lab 03/08/19  1201  WBC 2.8*  HGB 10.1*  HCT 33.6*  MCV 89.4  PLT 132*     Basic Metabolic Panel: Recent Labs  Lab 03/08/19 1201  NA 139  K 4.3  CL 110  CO2 21*  GLUCOSE 117*  BUN 19  CREATININE 1.50*  CALCIUM 8.4*   GFR: Estimated Creatinine Clearance: 37.3 mL/min (A) (by C-G formula based on SCr of 1.5 mg/dL (H)). Recent Labs  Lab 03/08/19 1201 03/08/19 1400  WBC 2.8*  --   LATICACIDVEN 4.5* 2.8*    Liver Function Tests: Recent Labs  Lab 03/08/19 1201  AST 33  ALT 21  ALKPHOS 98  BILITOT 1.1  PROT 5.3*  ALBUMIN 2.7*   No results for input(s): LIPASE, AMYLASE in the last 168 hours. No results for input(s): AMMONIA in the last 168 hours.  ABG    Component Value Date/Time   PHART 7.385 01/23/2017 2359   PCO2ART 41.3 01/23/2017 2359   PO2ART 248 (H) 01/23/2017 2359   HCO3 24.2 01/23/2017 2359   TCO2 23 12/09/2018 1335   ACIDBASEDEF 0.2 01/23/2017 2359   O2SAT 99.4 01/23/2017 2359     Coagulation Profile: No results for input(s): INR, PROTIME in the last 168 hours.  Cardiac Enzymes: No results for input(s): CKTOTAL, CKMB, CKMBINDEX, TROPONINI in the last 168 hours.  HbA1C: Hgb A1c MFr Bld  Date/Time Value Ref Range Status  01/24/2017 06:03 AM 5.7 (H) 4.8 - 5.6 % Final    Comment:    (NOTE) Pre diabetes:          5.7%-6.4% Diabetes:              >6.4% Glycemic control for   <7.0% adults with diabetes     CBG: Recent Labs  Lab 03/08/19 0956  GLUCAP 89    Review of Systems:   Not able   Past Medical History  He,  has a past medical history of Anticoagulant long-term use, Bladder tumor, BPH (benign prostatic hyperplasia), CKD (chronic kidney disease), stage II, Diverticulosis, Dyslipidemia, History of CVA with residual deficit (01/28/2017), History of external beam radiation therapy, Hypertension, Hypertensive heart disease, Hypertrophic cardiomegaly, apical variant, Hypertrophic obstructive cardiomyopathy with diastolic heart failure ([place]), Mitral regurgitation, Other problems related to education and literacy, and  Prostate cancer [facility]) (urologist-  dr dahlstedt/  oncologsit-- dr Tammi Klippel).   Surgical History    Past Surgical History:  Procedure Laterality Date  . 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 CARDIOVASCULAR STRESS TEST  11-19-2016  dr Stanford Breed   Low risk nuclear study/  normal resting and stress perfusion with no ischemia or infarction/  EF not done due to PVCIs , TID borderline, 1.2 significant LVH  . GOLD SEED IMPLANT N/A 03/10/2018   Procedure: GOLD SEED IMPLANT;  Surgeon: Franchot Gallo, MD;  Location: [facility];  Service: Urology;  Laterality: N/A;  . IR PERCUTANEOUS ART THROMBECTOMY/INFUSION INTRACRANIAL INC DIAG ANGIO  01/23/2017  . RADIOLOGY WITH ANESTHESIA N/A 01/23/2017   Procedure: RADIOLOGY WITH ANESTHESIA;  Surgeon: Luanne Bras, MD;  Location: [facility];  Service: Radiology;  Laterality: N/A;  . SPACE OAR INSTILLATION N/A 03/10/2018   Procedure: SPACE OAR INSTILLATION;  Surgeon: Franchot Gallo, MD;  Location: [facility];  Service: Urology;  Laterality: N/A;  . TRANSTHORACIC ECHOCARDIOGRAM  11-20-2016   dr Stanford Breed   mild LVH,  apical hypertrophic cardiomyopathy, systolic function vigorous,  ef 65-70%,  grade 2 diastolic  dysfunction/  mild AR, MR, and  PR/  moderate LAE and RAE/  mild to mod. TR/  moderate increase PASP 59mmHg  . TRANSURETHRAL RESECTION OF BLADDER TUMOR WITH MITOMYCIN-C N/A 12/09/2018   Procedure: TRANSURETHRAL RESECTION OF BLADDER TUMOR WITH GEMCITABINE GIVEN IN PACU;  Surgeon: Franchot Gallo, MD;  Location: [facility];  Service: Urology;  Laterality: N/A;  . TRANSURETHRAL RESECTION OF PROSTATE N/A 11/24/2016   Procedure: TRANSURETHRAL RESECTION OF THE PROSTATE (TURP);  Surgeon: Franchot Gallo, MD;  Location: [facility];  Service: Urology;  Laterality: N/A;     Social History   reports that he has been smoking cigarettes. He has a 25.50  pack-year smoking history. He has never used smokeless tobacco. He reports that he does not drink alcohol or use drugs.   Family History   His family history includes Cancer in his paternal aunt.   Allergies Allergies  Allergen Reactions  . Feraheme [Ferumoxytol] Anaphylaxis     Home Medications  Prior to Admission medications   Medication Sig Start Date End Date Taking? Authorizing Provider  atenolol (TENORMIN) 50 MG tablet TAKE 1 TABLET (50 MG TOTAL) BY MOUTH EVERY MORNING. Patient taking differently: Take 50 mg by mouth daily.  06/30/17  Yes [name], MD  AYR SALINE NASAL NA Place 2 sprays into the nose 4 (four) times daily as needed.   Yes [provider]  Calcium Carbonate-Simethicone (TUMS GAS RELIEF CHEWY BITES PO) Take 1 tablet by mouth daily as needed (for gas).    Yes [provider]  Cholecalciferol (VITAMIN D) 2000 units CAPS Take 2,000 Units by mouth daily.   Yes [provider]  feeding supplement, ENSURE ENLIVE, (ENSURE ENLIVE) LIQD Take 237 mLs by mouth 3 (three) times daily. 11/15/17  Yes Lorella Nimrod, MD  finasteride (PROSCAR) 5 MG tablet Take 1 tablet (5 mg total) by mouth daily. 02/03/17  Yes Angiulli, Lavon Paganini, PA-C  nicotine polacrilex (NICORETTE) 2 MG gum Take 2 mg by mouth as needed for smoking cessation.   Yes [provider]  phenazopyridine (PYRIDIUM) 200 MG tablet Take 200 mg by mouth 2 (two) times daily as needed for pain.   Yes [provider]  polyethylene glycol (MIRALAX / GLYCOLAX) packet Take 17 g by mouth daily as needed. 11/15/17  Yes Lorella Nimrod, MD  rosuvastatin (CRESTOR) 10 MG tablet Take 10 mg by mouth at bedtime.    Yes [provider]  tamsulosin (FLOMAX) 0.4 MG CAPS capsule Take 1 capsule (0.4 mg total) by mouth daily. 02/03/17  Yes Angiulli, Lavon Paganini, PA-C  apixaban (ELIQUIS) 5 MG TABS tablet Take 1 tablet (5 mg total) by mouth 2 (two) times daily. 02/03/17   Cathlyn Parsons, PA-C      Critical care time: 32 minutes    Erick Colace ACNP-BC [facility] Pager # [phone] OR # [phone] if no answer

## 2019-03-08 NOTE — Significant Event (Signed)
Rapid Response Event Note  Overview: Time Called: 0946 Arrival Time: 0950 Event Type: Hypotension  Called by Medical Day RN for pt with reaction after receiving Feraheme infusion. Per RN, pt became nauseous and vomited with hypotension present.    Initial Focused Assessment: On arrival, pt laying in bed moaning but able to arouse and state his name and that he needed to use the "commode". PT BP 60/32.   Interventions: 759ml bolus zofran Solumedrol AC called Pt transferred to ED   Event Summary:  called at  0946   Event ended at  [address]

## 2019-03-08 NOTE — Progress Notes (Signed)
Pt here today for second dose of IV Feraheme.  He received feraheme last week and did well with it. He received almost the entire dose today when he began to have nausea and vomiting, he became difficult to arouse, was moaning, urinated on himself, and his Blood pressure was in the [id number] systolic.  We ran Normal saline in wide open, put the patient on his side and in trendelenburg, placed him on 2 liters of oxygen, called the nurse practioner for him as well as rapid response, gave Zofran IV and solumedrol IV, he received 500cc of normal saline and still BP was in the 60's but he was more alert and oriented to his name, and I assisted the RR nurse to the ER for further care.

## 2019-03-08 NOTE — ED Notes (Signed)
[facility] patient: [phone]

## 2019-03-08 NOTE — ED Triage Notes (Signed)
Pt here to get second iron infusion, during infusion pt became difficult to arouse, vomited, and blood pressure dropped to 60s. Pt mumbling incoherently. Pt given 0.3 mg epi emergently on arrival.

## 2019-03-08 NOTE — Progress Notes (Signed)
[place] Progress Note Patient Name: Ian Hill DOB: 11/16/1950 MRN: [id number]   Date of Service  03/08/2019  HPI/Events of Note  Notified of Trop 1 166, lactate 2.1 from 2.8, creatinine 1.5  eICU Interventions  Elevated troponin likely from demand ischemia from hypotension. Repeat in 6 hours     Intervention Category Major Interventions: Other:  Judd Lien 03/08/2019, 9:26 PM

## 2019-03-09 ENCOUNTER — Inpatient Hospital Stay (HOSPITAL_COMMUNITY): Payer: Medicare (Managed Care)

## 2019-03-09 DIAGNOSIS — I340 Nonrheumatic mitral (valve) insufficiency: Secondary | ICD-10-CM

## 2019-03-09 DIAGNOSIS — T782XXA Anaphylactic shock, unspecified, initial encounter: Secondary | ICD-10-CM

## 2019-03-09 DIAGNOSIS — I361 Nonrheumatic tricuspid (valve) insufficiency: Secondary | ICD-10-CM

## 2019-03-09 LAB — CBC
HCT: 25.2 % — ABNORMAL LOW (ref 39.0–52.0)
HCT: 29.3 % — ABNORMAL LOW (ref 39.0–52.0)
Hemoglobin: 7.6 g/dL — ABNORMAL LOW (ref 13.0–17.0)
Hemoglobin: 8.7 g/dL — ABNORMAL LOW (ref 13.0–17.0)
MCH: 26.4 pg (ref 26.0–34.0)
MCH: 26.6 pg (ref 26.0–34.0)
MCHC: 30.2 g/dL (ref 30.0–36.0)
MCHC: 29.7 g/dL — ABNORMAL LOW (ref 30.0–36.0)
MCV: 87.5 fL (ref 80.0–100.0)
MCV: 89.6 fL (ref 80.0–100.0)
Platelets: 148 10*3/uL — ABNORMAL LOW (ref 150–400)
Platelets: 127 10*3/uL — ABNORMAL LOW (ref 150–400)
RBC: 2.88 MIL/uL — ABNORMAL LOW (ref 4.22–5.81)
RBC: 3.27 MIL/uL — ABNORMAL LOW (ref 4.22–5.81)
RDW: 25.2 % — ABNORMAL HIGH (ref 11.5–15.5)
RDW: 25.4 % — ABNORMAL HIGH (ref 11.5–15.5)
WBC: 7.4 10*3/uL (ref 4.0–10.5)
WBC: 7.8 10*3/uL (ref 4.0–10.5)
nRBC: 0 % (ref 0.0–0.2)
nRBC: 0.4 % — ABNORMAL HIGH (ref 0.0–0.2)

## 2019-03-09 LAB — BASIC METABOLIC PANEL
Anion gap: 10 (ref 5–15)
BUN: 29 mg/dL — ABNORMAL HIGH (ref 8–23)
CO2: 19 mmol/L — ABNORMAL LOW (ref 22–32)
Calcium: 8.2 mg/dL — ABNORMAL LOW (ref 8.9–10.3)
Chloride: 108 mmol/L (ref 98–111)
Creatinine, Ser: 1.76 mg/dL — ABNORMAL HIGH (ref 0.61–1.24)
GFR calc Af Amer: 45 mL/min — ABNORMAL LOW (ref >60)
GFR calc non Af Amer: 39 mL/min — ABNORMAL LOW (ref >60)
Glucose, Bld: 220 mg/dL — ABNORMAL HIGH (ref 70–99)
Potassium: 4.8 mmol/L (ref 3.5–5.1)
Sodium: 137 mmol/L (ref 135–145)

## 2019-03-09 LAB — RENAL FUNCTION PANEL
Albumin: 2.8 g/dL — ABNORMAL LOW (ref 3.5–5.0)
Anion gap: 7 (ref 5–15)
BUN: 24 mg/dL — ABNORMAL HIGH (ref 8–23)
CO2: 20 mmol/L — ABNORMAL LOW (ref 22–32)
Calcium: 8.1 mg/dL — ABNORMAL LOW (ref 8.9–10.3)
Chloride: 110 mmol/L (ref 98–111)
Creatinine, Ser: 1.5 mg/dL — ABNORMAL HIGH (ref 0.61–1.24)
GFR calc Af Amer: 54 mL/min — ABNORMAL LOW (ref >60)
GFR calc non Af Amer: 47 mL/min — ABNORMAL LOW (ref >60)
Glucose, Bld: 213 mg/dL — ABNORMAL HIGH (ref 70–99)
Phosphorus: 3.6 mg/dL (ref 2.5–4.6)
Potassium: 4.8 mmol/L (ref 3.5–5.1)
Sodium: 137 mmol/L (ref 135–145)

## 2019-03-09 LAB — LIPID PANEL
Cholesterol: 102 mg/dL (ref 0–200)
HDL: 53 mg/dL (ref >40)
LDL Cholesterol: 43 mg/dL (ref 0–99)
Total CHOL/HDL Ratio: 1.9 RATIO
Triglycerides: 32 mg/dL (ref <150)
VLDL: 6 mg/dL (ref 0–40)

## 2019-03-09 LAB — ECHOCARDIOGRAM COMPLETE
Height: 72 in
Weight: 1925.94 oz

## 2019-03-09 LAB — TROPONIN I (HIGH SENSITIVITY)
Troponin I (High Sensitivity): 176 ng/L (ref <18)
Troponin I (High Sensitivity): 204 ng/L (ref <18)

## 2019-03-09 LAB — LACTIC ACID, PLASMA: Lactic Acid, Venous: 2.9 mmol/L (ref 0.5–1.9)

## 2019-03-09 LAB — MAGNESIUM: Magnesium: 2.2 mg/dL (ref 1.7–2.4)

## 2019-03-09 MED ORDER — ATENOLOL 50 MG PO TABS: 50.0000 mg | ORAL_TABLET | Freq: Every day | ORAL | Status: DC

## 2019-03-09 MED ORDER — CHLORHEXIDINE GLUCONATE CLOTH 2 % EX PADS
6.0000 | MEDICATED_PAD | Freq: Every day | CUTANEOUS | Status: DC
Start: 2019-03-09 — End: 2019-03-13
  Administered 2019-03-09 – 2019-03-11 (×2): 6 via TOPICAL

## 2019-03-09 MED ORDER — TAMSULOSIN HCL 0.4 MG PO CAPS
0.4000 mg | ORAL_CAPSULE | Freq: Every day | ORAL | Status: DC
  Administered 2019-03-09 – 2019-03-14 (×6): 0.4 mg via ORAL
  Filled 2019-03-09 (×6): qty 1

## 2019-03-09 MED ORDER — ORAL CARE MOUTH RINSE
15.0000 mL | Freq: Two times a day (BID) | OROMUCOSAL | Status: DC
  Administered 2019-03-09 – 2019-03-10 (×4): 15 mL via OROMUCOSAL

## 2019-03-09 NOTE — Progress Notes (Addendum)
NAME:  Ian Hill, MRN:  [id number], DOB:  1950-09-26, LOS: 1 ADMISSION DATE:  03/08/2019, CONSULTATION DATE:  10/27 REFERRING MD:  Sedonia Small, CHIEF COMPLAINT:   Anaphylaxis   Brief History   68 year old male patient who is in the outpatient clinic receiving Feraheme developed acute hypotension, nausea vomiting and altered mental status was admitted with working diagnosis of anaphylaxis on 10/27.  History of present illness   68 year old male patient with a history of hypertension, HOCM, CKD and iron deficiency anemia.  He was a [name] on 10/27 for second dose of Feraheme.  After receiving almost his entire dose on 10/27 he developed acute nausea, vomiting, decreased mental status and systolic blood pressure down into the 40s.  He was immediately administered fluid volume challenge, placed in Trendelenburg position, given Solu-Medrol supplemental oxygen and transferred to the emergency room with a working diagnosis of anaphylaxis.  He received 2 doses of intramuscular epinephrine with improved mental status and overall blood pressure however he continued to remain hypotensive intermittently and because of this critical care was asked to admit.  Past Medical History  HOCM, CKD stage II,Hypertension, prior MCA stroke 2018, prior nonsustained ventricular tachycardia. Chronic diastolic heart failure.  Mitral regurg.  History of prostate cancer. Significant Hospital Events   10/27 admitted  Consults:  Not applicable  Procedures:    Significant Diagnostic Tests:    Micro Data:  MRSA surveillance negative SARS Coronavirus 2 negative   Antimicrobials:     Interim history/subjective:  Feels much better.  Wants to eat.  No SOB.  VSS.   Objective   Blood pressure 116/72, pulse 71, temperature (!) 97.1 F (36.2 C), temperature source Axillary, resp. rate 18, height 6' (1.829 m), weight 54.6 kg, SpO2 92 %.        Intake/Output Summary (Last 24 hours) at  03/09/2019 0741 Last data filed at 03/09/2019 0615 Gross per 24 hour  Intake 2462.27 ml  Output 430 ml  Net 2032.27 ml   Filed Weights   03/08/19 1740  Weight: 54.6 kg    Examination: General: Elderly, delightful, thin, Well-developed sitting up in chair. NAD HENT: Normocephalic, PERRL. Moist mucus membranes. Poor dentition.  Neck: No JVD. Trachea midline. No thyromegaly, no lymphadenopathy CV: RRR. S1S2. No MRG. +2 distal pulses Lungs: BBS present, clear, FNL, symmetrical ABD: +BS x4. SNT/ND. No masses, guarding or rigidity GU: No Foley EXT: MAE well. No edema Skin: PWD. In tact. No rashes or lesions Neuro: A&Ox3. CN II-XII in tact. No focal deficits. Delay in speech Psych: Appropriate mood, insight and judgment for time and situation     Resolved Hospital Problem list    Assessment & Plan:   Circulatory shock.  Presumed anaphylaxis secondary to allergic reaction to [facility]. Resolved.  VSS.  No pressors.  Denies chest pain, shortness of breath no upper airway stridor or wheezing Plan D/C steroids, famotidine, diphenhydramine Transfer to medical floor   History of hypertrophic cardiomyopathy with diastolic heart dysfunction, paroxysmal atrial fibrillation Plan Continuing telemetry monitoring Holding antihypertensives for now-resume as appropriate  Continue Eliquis  CKD D stage II, with mild elevated serum creatinine suggesting acute on chronic injury Plan VSS Renal dose medications Strict intake and output Follow BMP    Mild post shock lactic acidosis-down trending  I do not think this is due to sepsis Plan Repeat lactate  Anemia-likely dilutional Plan Repeat in am or F/u with PCP  Elevated troponin-likely demand ischemia 2/2 shock  No CP Plan Repeat  EKG Repeat troponin   Prior CVA.  With baseline cognitive delay Plan Supportive care [facility] notified    Best practice:  Diet: Regular diet  Pain/Anxiety/Delirium protocol (if indicated):  na VAP protocol (if indicated): na DVT prophylaxis: eliquis  GI prophylaxis: na Glucose control: na Mobility: as tolerated  Code Status: full code  Family Communication: will update Disposition: transfer to medical floor. Can possibly be d/c today pending labs/EKG.   Labs   CBC: Recent Labs  Lab 03/08/19 1201 03/09/19 0344  WBC 2.8* 7.4  HGB 10.1* 7.6*  HCT 33.6* 25.2*  MCV 89.4 87.5  PLT 132* 148*    Basic Metabolic Panel: Recent Labs  Lab 03/08/19 1201 03/09/19 0344  NA 139 137  K 4.3 4.8  CL 110 110  CO2 21* 20*  GLUCOSE 117* 213*  BUN 19 24*  CREATININE 1.50* 1.50*  CALCIUM 8.4* 8.1*  PHOS  --  3.6   GFR: Estimated Creatinine Clearance: 35.9 mL/min (A) (by C-G formula based on SCr of 1.5 mg/dL (H)). Recent Labs  Lab 03/08/19 1201 03/08/19 1400 03/08/19 1939 03/09/19 0344  WBC 2.8*  --   --  7.4  LATICACIDVEN 4.5* 2.8* 2.1*  --     Liver Function Tests: Recent Labs  Lab 03/08/19 1201 03/09/19 0344  AST 33  --   ALT 21  --   ALKPHOS 98  --   BILITOT 1.1  --   PROT 5.3*  --   ALBUMIN 2.7* 2.8*     CBG: Recent Labs  Lab 03/08/19 0956 03/08/19 1748  GLUCAP [address] [place], MSN, AGACNP   Pulmonary & Critical Care

## 2019-03-09 NOTE — Progress Notes (Signed)
  Echocardiogram 2D Echocardiogram has been performed.  Ian Hill 03/09/2019, 11:12 AM

## 2019-03-09 NOTE — Progress Notes (Addendum)
eLink Physician-Brief Progress Note Patient Name: Ian Hill DOB: Jul 11, 1950 MRN: [id number]   Date of Service  03/09/2019  HPI/Events of Note  Notified of 12 beat run of VT then irregular rhythm, looked like afib and then back to sinus rhythm.   eICU Interventions  Check EKG and check electrolytes and replete as needed.     Intervention Category Intermediate Interventions: Arrhythmia - evaluation and management  Elsie Lincoln 03/09/2019, 10:09 PM   11:20 PM EKG shows probable accelerated junctional rhythm.   K 4.4, calcium 8.2, corrected for albumin is 9.2, Mg 2.2.  Plan> Electrolytes within normal limits.  No AV nodal blocking agents.  Continue to monitor for now.

## 2019-03-10 DIAGNOSIS — I421 Obstructive hypertrophic cardiomyopathy: Secondary | ICD-10-CM

## 2019-03-10 DIAGNOSIS — R7989 Other specified abnormal findings of blood chemistry: Secondary | ICD-10-CM

## 2019-03-10 DIAGNOSIS — I472 Ventricular tachycardia: Secondary | ICD-10-CM

## 2019-03-10 DIAGNOSIS — I480 Paroxysmal atrial fibrillation: Secondary | ICD-10-CM

## 2019-03-10 DIAGNOSIS — I4892 Unspecified atrial flutter: Secondary | ICD-10-CM

## 2019-03-10 DIAGNOSIS — D649 Anemia, unspecified: Secondary | ICD-10-CM

## 2019-03-10 LAB — BASIC METABOLIC PANEL
Anion gap: 9 (ref 5–15)
BUN: 28 mg/dL — ABNORMAL HIGH (ref 8–23)
CO2: 21 mmol/L — ABNORMAL LOW (ref 22–32)
Calcium: 8.2 mg/dL — ABNORMAL LOW (ref 8.9–10.3)
Chloride: 108 mmol/L (ref 98–111)
Creatinine, Ser: 1.7 mg/dL — ABNORMAL HIGH (ref 0.61–1.24)
GFR calc Af Amer: 47 mL/min — ABNORMAL LOW (ref >60)
GFR calc non Af Amer: 40 mL/min — ABNORMAL LOW (ref >60)
Glucose, Bld: 171 mg/dL — ABNORMAL HIGH (ref 70–99)
Potassium: 4.6 mmol/L (ref 3.5–5.1)
Sodium: 138 mmol/L (ref 135–145)

## 2019-03-10 LAB — CBC
HCT: 25.7 % — ABNORMAL LOW (ref 39.0–52.0)
Hemoglobin: 7.8 g/dL — ABNORMAL LOW (ref 13.0–17.0)
MCH: 26.4 pg (ref 26.0–34.0)
MCHC: 30.4 g/dL (ref 30.0–36.0)
MCV: 87.1 fL (ref 80.0–100.0)
Platelets: 141 10*3/uL — ABNORMAL LOW (ref 150–400)
RBC: 2.95 MIL/uL — ABNORMAL LOW (ref 4.22–5.81)
RDW: 25.4 % — ABNORMAL HIGH (ref 11.5–15.5)
WBC: 7.8 10*3/uL (ref 4.0–10.5)
nRBC: 1.8 % — ABNORMAL HIGH (ref 0.0–0.2)

## 2019-03-10 LAB — TROPONIN I (HIGH SENSITIVITY)
Troponin I (High Sensitivity): 158 ng/L (ref <18)
Troponin I (High Sensitivity): 173 ng/L (ref <18)

## 2019-03-10 LAB — LACTIC ACID, PLASMA: Lactic Acid, Venous: 3.4 mmol/L (ref 0.5–1.9)

## 2019-03-10 LAB — GLUCOSE, CAPILLARY: Glucose-Capillary: 178 mg/dL — ABNORMAL HIGH (ref 70–99)

## 2019-03-10 LAB — MAGNESIUM: Magnesium: 2.3 mg/dL (ref 1.7–2.4)

## 2019-03-10 MED ORDER — OLANZAPINE 10 MG IM SOLR
5.0000 mg | Freq: Once | INTRAMUSCULAR | Status: AC | PRN
  Administered 2019-03-10: 5 mg via INTRAMUSCULAR
  Filled 2019-03-10: qty 10

## 2019-03-10 MED ORDER — ATROPINE SULFATE 1 MG/10ML IJ SOSY
1.0000 mg | PREFILLED_SYRINGE | INTRAMUSCULAR | Status: DC | PRN
  Filled 2019-03-10: qty 10

## 2019-03-10 NOTE — Progress Notes (Signed)
[place] Progress Note Patient Name: Ian Hill DOB: May 19, 1950 MRN: [id number]   Date of Service  03/10/2019  HPI/Events of Note  Notified of unresponsiveness.    RN rushed into the room when the monitor displayed asystole.  HR was in the 40s when she was in the room and pt was not responsive.    Glucose 170s.  Shortly thereafter, pt was waking up and back to his baseline. HR in the 80s.   Pt became very agitated and started swinging at staff.   eICU Interventions  Suspect this is related to syncope.  Pt was noted to have junctional rhythm on EKG earlier..  Repeat EKG again with accelerated junctional rhythm.   Discontinue atenolol.   Give Zyprexa 5mg  IM.     Intervention Category Intermediate Interventions: Arrhythmia - evaluation and management  Elsie Lincoln 03/10/2019, 2:33 AM

## 2019-03-10 NOTE — Progress Notes (Signed)
Spoke with his sister, Gregary Signs, at length.  She does not think he nor his family would want a defibrillator, as previously discussed. They are open to PPM, but think he would do poorly with the healing and initial restrictions from a traditional PPM.  She will come tomorrow am to discuss options, including leadless pacemaker in person to help explain things to her brother. I confirmed with floor staff that family aren't allowed up a ny earlier than 10 am, even in this setting.  Legrand Como [address] [place], [place]

## 2019-03-10 NOTE — Progress Notes (Signed)
PT ringing asystole on monitor. Upon entering room, PT unresponsive to voice and sternal rub. Carotid pulse palpable; monitor then read HR 40. BP stable; skin clammy. E-link MD notified. PT suddenly woke up to a thigh pinch, but remained unable to speak and in/out of consciousness with dry-heaving. Appeared to have left sided neglect; rapid response called-- arrived to bedside with [place], MD. PT regained full consciousness and became combative, attempting to hit and kick nurses.Four point restraints applied and 5 mg Zyprexa IM given. PT remains agitated and noncompliant in bed. RN will continue to monitor PT closely.

## 2019-03-10 NOTE — Progress Notes (Signed)
NAME:  Ian Hill, MRN:  [id number], DOB:  June 19, 1950, LOS: 2 ADMISSION DATE:  03/08/2019, CONSULTATION DATE:  10/27 REFERRING MD:  Sedonia Small, CHIEF COMPLAINT:   Anaphylaxis   Brief History   68 year old male patient who is in the outpatient clinic receiving Feraheme developed acute hypotension, nausea vomiting and altered mental status was admitted with working diagnosis of anaphylaxis on 10/27.  History of present illness   68 year old male patient with a history of hypertension, HOCM, CKD and iron deficiency anemia.  He was a [name] on 10/27 for second dose of Feraheme.  After receiving almost his entire dose on 10/27 he developed acute nausea, vomiting, decreased mental status and systolic blood pressure down into the 40s.  He was immediately administered fluid volume challenge, placed in Trendelenburg position, given Solu-Medrol supplemental oxygen and transferred to the emergency room with a working diagnosis of anaphylaxis.  He received 2 doses of intramuscular epinephrine with improved mental status and overall blood pressure however he continued to remain hypotensive intermittently and because of this critical care was asked to admit.  Past Medical History  HOCM, CKD stage II,Hypertension, prior MCA stroke 2018, prior nonsustained ventricular tachycardia. Chronic diastolic heart failure.  Mitral regurg.  History of prostate cancer. Significant Hospital Events   10/27 admitted 10/28-~10pm 12 beat run VT, then episode of unresponsiveness around 0233 10/29 with asystole then HR in 40s, woke up with stimulation, agitated and swinging at staff, given Zyprexa and placed in 4 point restraints.   Consults:  Cardiology   Procedures:    Significant Diagnostic Tests:  10/28 echo  1. Left ventricular ejection fraction, by visual estimation, is 60 to 65%. The left ventricle has normal function. Normal left ventricular size. Left ventricular septal wall thickness was  severely increased. Severely increased left ventricular posterior  wall thickness. There is severely increased left ventricular hypertrophy.  2. Left ventricular diastolic function could not be evaluated pattern of LV diastolic filling.  3. Severe hypertrophy most prominent in the septum and apex.  4. Global right ventricle has normal systolic function.The right ventricular size is mildly enlarged. No increase in right ventricular wall thickness.  5. Left atrial size was moderately dilated.  6. Right atrial size was severely dilated.  7. The mitral valve is normal in structure. Mild mitral valve regurgitation. No evidence of mitral stenosis.  8. The tricuspid valve is myxomatous. Tricuspid valve regurgitation severe.  9. The aortic valve is normal in structure. Aortic valve regurgitation is trivial by color flow Doppler. Structurally normal aortic valve, with no evidence of sclerosis or stenosis. 10. There is Mild calcification of the aortic valve. 11. There is Moderate thickening of the aortic valve. 12. The pulmonic valve was normal in structure. Pulmonic valve regurgitation is not visualized by color flow Doppler. 13. Moderately elevated pulmonary artery systolic pressure. 14. The inferior vena cava is dilated in size with <50% respiratory variability, suggesting right atrial pressure of 15 mmHg.  Micro Data:  MRSA surveillance negative SARS Coronavirus 2 negative   Antimicrobials:   NA  Interim history/subjective:  See significant events.  Agitated this am c/o being restrained overnight-no longer in restraints. [name] eating with stable VS.  Run of self limiting, asymptomatic VT this am.   Objective   Blood pressure 120/85, pulse 100, temperature 98.5 F (36.9 C), temperature source Oral, resp. rate 20, height 6' (1.829 m), weight 55 kg, SpO2 98 %.        Intake/Output Summary (Last  24 hours) at 03/10/2019 [id number] Last data filed at 03/10/2019 0500 Gross per 24 hour  Intake --   Output 550 ml  Net -550 ml   Filed Weights   03/08/19 1740 03/10/19 0500  Weight: 54.6 kg 55 kg    Examination: General: Elderly, thin, Well-developed sitting up in bed eating breakfast, aggiated but NAD HENT: Normocephalic, PERRL. Moist mucus membranes. Poor dentition.  Neck: No JVD. Trachea midline.  CV: RRR. S1S2. No MRG. +2 distal pulses Lungs: BBS present, clear, FNL, symmetrical ABD: +BS x4. SNT/ND. No masses, guarding or rigidity GU: Condom cath  EXT: MAE well. No edema Skin: PWD. In tact. No rashes or lesions Neuro: A&Ox3. CN II-XII in tact. No focal deficits. Delay in speech Psych: Appropriate mood, insight and judgment for time and situation     Resolved Hospital Problem list    Assessment & Plan:   Circulatory shock. Was presumed anaphylaxis secondary to allergic reaction to [facility]. After further investigation into h/o HCM and review of current echo and EKG I suspect pt had Feraheme induced transient hypotension as opposed an anaphylactic reaction and was c/b HCM. He has had episodes of VT and arrhythmias overnight, has been offered ICD in past but family declined. Resolved VSS.  Denies chest pain, shortness of breath. He had  no upper airway stridor or wheezing Plan Consult cardiology   History of hypertrophic cardiomyopathy with diastolic heart dysfunction, paroxysmal atrial fibrillation Plan Continuing telemetry monitoring Holding antihypertensives for now-resume as appropriate  Continue Eliquis Consult as noted   CKD D stage II, with mild elevated serum creatinine suggesting acute on chronic injury Appears around baseline Plan Renal dose medications Strict intake and output Follow BMP    Mild post shock lactic acidosis-suspect transient decreased CO related and not sepsis  Plan No further trending   Anemia-likely dilutional c/b Fe deficiency  Plan Monitor   Elevated troponin-likely demand ischemia 2/2 HCM  Anterolateral ST depression and  inverted T waves appear chronic though more pronounced this admission  Plan Cardiology consultation   Prior CVA.  With baseline cognitive delay Plan Supportive care [facility] notified    Best practice:  Diet: Regular diet  Pain/Anxiety/Delirium protocol (if indicated): na VAP protocol (if indicated): na DVT prophylaxis: eliquis  GI prophylaxis: na Glucose control: na Mobility: as tolerated  Code Status: full code  Family Communication: will update Disposition: Remained in ICU as no beds on medical/tele floor. Otherwise pending cardiology eval and recommendations.   Labs   CBC: Recent Labs  Lab 03/08/19 1201 03/09/19 0344 03/09/19 1030 03/10/19 0317  WBC 2.8* 7.4 7.8 7.8  HGB 10.1* 7.6* 8.7* 7.8*  HCT 33.6* 25.2* 29.3* 25.7*  MCV 89.4 87.5 89.6 87.1  PLT 132* 148* 127* 141*    Basic Metabolic Panel: Recent Labs  Lab 03/08/19 1201 03/09/19 0344 03/09/19 2221 03/10/19 0317  NA 139 137 137 138  K 4.3 4.8 4.8 4.6  CL 110 110 108 108  CO2 21* 20* 19* 21*  GLUCOSE 117* 213* 220* 171*  BUN 19 24* 29* 28*  CREATININE 1.50* 1.50* 1.76* 1.70*  CALCIUM 8.4* 8.1* 8.2* 8.2*  MG  --   --  2.2 2.3  PHOS  --  3.6  --   --    GFR: Estimated Creatinine Clearance: 31.9 mL/min (A) (by C-G formula based on SCr of 1.7 mg/dL (H)). Recent Labs  Lab 03/08/19 1201 03/08/19 1400 03/08/19 1939 03/09/19 0344 03/09/19 0757 03/09/19 1030 03/10/19 0317  WBC 2.8*  --   --  7.4  --  7.8 7.8  LATICACIDVEN 4.5* 2.8* 2.1*  --  2.9*  --  3.4*    Liver Function Tests: Recent Labs  Lab 03/08/19 1201 03/09/19 0344  AST 33  --   ALT 21  --   ALKPHOS 98  --   BILITOT 1.1  --   PROT 5.3*  --   ALBUMIN 2.7* 2.8*     CBG: Recent Labs  Lab 03/08/19 0956 03/08/19 1748  GLUCAP 81 Sombrillo, MSN, AGACNP  [facility]

## 2019-03-10 NOTE — Consult Note (Addendum)
Cardiology Consultation:   Patient ID: Ian Hill MRN: [id number]; DOB: 1950-11-30  Admit date: 03/08/2019 Date of Consult: 03/10/2019  Primary Care Provider: Linda Hedges, NP Primary Cardiologist: Kirk Ruths, MD  Primary Electrophysiologist:  None    Patient Profile:   Ian Hill is a 68 y.o. male with a hx of HCM, NSVT, PAF, hypertension, MR, prostate cancer CVA who is being seen today for the evaluation of NSVT/asystole at the request of Dr. Vaughan Browner.  History of Present Illness:   [name] is a 68 year old male with past medical history noted above.  Notes indicate the option of ICD was discussed with the patient and family in the past but they declined.  He had a right MCA stroke in September 2018 and was treated with TPA followed by surgical revascularization of occluded vessel.  Cardiology was consulted 01/24/2017 for elevated troponin and abnormal EKG.  Also noted to have runs of nonsustained VT on telemetry as documented PAF.  Echocardiogram at that time showed apical variant hypertrophic cardiomyopathy with grade 2 diastolic dysfunction, mild aortic insufficiency and mild to moderate mitral regurgitation.  He has been maintained since that time on Eliquis and atenolol.  Was cleared for prostate surgery and TURP in October 2019 with Dr. Diona Fanti.  He was seen in the office on 03/03/2019 and reported hematuria but had and resolved just prior to visit.  Decision was made at this office visit to hold Eliquis for 5 days.  If hematuria resumed after restarting would need to consider stopping though he would be at high risk for recurrent stroke.  On 03/08/2019 he presented to the medical day center to receive a second dose of IV Feraheme.  After completing almost the entire dose he began to have nausea and vomiting and became difficult to arouse was incontinent of urine.  Blood pressures dropped into the [id number] systolic.  He was given IV Zofran and Solu-Medrol along with a  saline bolus and transferred to the ER for further care.   In the ED his labs showed stable electrolytes, creatinine 1.5, lactic acid 4.5>> 2.8, WBC 2.8, hemoglobin 10.  Covid negative.  According to notes his mental status improved, but he remained hypotensive with systolic pressures in the 70s and 80s. Given 2 doses of IV epi. He was admitted with working diagnosis of circulatory shock from presumed anaphylaxis secondary to IV Feraheme.  He was placed on steroids, famotidine and Benadryl.  Follow-up high-sensitivity troponin 166>> 176>> 204.  EKG showed lesion heart rate 99 with ST depression in inferior lateral leads.  On the evening of 10/28 he developed 12 beat run of VT followed by a regular rhythm that appeared to be A. fib then back into sinus rhythm.  Follow-up electrolytes were stable.  This morning around 2:30 AM was reported he had brief episode of asystole followed by heart rate in the 40s and patient was unresponsive.  Blood sugar was checked and noted at 170.  Shortly after patient was more arousable and back to his baseline.  He then became agitated and aggressive with staff.  His atenolol was discontinued. Review of telemetry does show multiple episodes of NSVT, longest being around 12 beats. This morning around 2:15am telemetry shows atrial flutter then 2 episodes around 8.5 secs of isolated p waves noted followed by junctional rhythm then atrial fib.  [name] currently sitting up in bed at baseline. No longer in restraints. No complaints other than his arms are sore from being in "cuffs". Currently in  Afib rate controlled on telemetry.   Heart Pathway Score:     Past Medical History:  Diagnosis Date  . Anticoagulant long-term use    eliquis  . Bladder tumor   . BPH (benign prostatic hyperplasia)   . CKD (chronic kidney disease), stage II   . Diverticulosis   . Dyslipidemia   . History of CVA with residual deficit 01/28/2017   acute right MCA infarc with M2 embolic occlusion w/p   tPA and mechnical thrombectomy---  residual left side weakness  . History of external beam radiation therapy    03-23-2018  to 05-03-2018  prostate cancer  . Hypertension   . Hypertensive heart disease   . Hypertrophic cardiomegaly, apical variant   . Hypertrophic obstructive cardiomyopathy with diastolic heart failure [facility])    cardiologist-- dr Stanford Breed  . Mitral regurgitation   . Other problems related to education and literacy    can not read  . Prostate cancer [facility]) urologist-  dr dahlstedt/  oncologsit-- dr Tammi Klippel   dx 07/ 2018---  Stage T1a,  Gleason 4+3--- completed external radiation therapy 05-03-2018    Past Surgical History:  Procedure Laterality Date  . 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 CARDIOVASCULAR STRESS TEST  11-19-2016  dr Stanford Breed   Low risk nuclear study/  normal resting and stress perfusion with no ischemia or infarction/  EF not done due to PVCIs , TID borderline, 1.2 significant LVH  . GOLD SEED IMPLANT N/A 03/10/2018   Procedure: GOLD SEED IMPLANT;  Surgeon: Franchot Gallo, MD;  Location: [facility];  Service: Urology;  Laterality: N/A;  . IR PERCUTANEOUS ART THROMBECTOMY/INFUSION INTRACRANIAL INC DIAG ANGIO  01/23/2017  . RADIOLOGY WITH ANESTHESIA N/A 01/23/2017   Procedure: RADIOLOGY WITH ANESTHESIA;  Surgeon: Luanne Bras, MD;  Location: [place];  Service: Radiology;  Laterality: N/A;  . SPACE OAR INSTILLATION N/A 03/10/2018   Procedure: SPACE OAR INSTILLATION;  Surgeon: Franchot Gallo, MD;  Location: [facility];  Service: Urology;  Laterality: N/A;  . TRANSTHORACIC ECHOCARDIOGRAM  11-20-2016   dr Stanford Breed   mild LVH,  apical hypertrophic cardiomyopathy, systolic function vigorous,  ef 65-70%,  grade 2 diastolic dysfunction/  mild AR, MR, and  PR/  moderate LAE and RAE/  mild to mod. TR/  moderate increase PASP 1mmHg  . TRANSURETHRAL RESECTION OF BLADDER TUMOR WITH  MITOMYCIN-C N/A 12/09/2018   Procedure: TRANSURETHRAL RESECTION OF BLADDER TUMOR WITH GEMCITABINE GIVEN IN PACU;  Surgeon: Franchot Gallo, MD;  Location: [facility];  Service: Urology;  Laterality: N/A;  . TRANSURETHRAL RESECTION OF PROSTATE N/A 11/24/2016   Procedure: TRANSURETHRAL RESECTION OF THE PROSTATE (TURP);  Surgeon: Franchot Gallo, MD;  Location: [facility];  Service: Urology;  Laterality: N/A;     Home Medications:  Prior to Admission medications   Medication Sig Start Date End Date Taking? Authorizing Provider  atenolol (TENORMIN) 50 MG tablet TAKE 1 TABLET (50 MG TOTAL) BY MOUTH EVERY MORNING. Patient taking differently: Take 50 mg by mouth daily.  06/30/17  Yes [name], MD  AYR SALINE NASAL NA Place 2 sprays into the nose 4 (four) times daily as needed.   Yes [provider]  Calcium Carbonate-Simethicone (TUMS GAS RELIEF CHEWY BITES PO) Take 1 tablet by mouth daily as needed (for gas).    Yes [provider]  Cholecalciferol (VITAMIN D) 2000 units CAPS Take 2,000 Units by mouth daily.  Yes [provider]  feeding supplement, ENSURE ENLIVE, (ENSURE ENLIVE) LIQD Take 237 mLs by mouth 3 (three) times daily. 11/15/17  Yes Lorella Nimrod, MD  finasteride (PROSCAR) 5 MG tablet Take 1 tablet (5 mg total) by mouth daily. 02/03/17  Yes Angiulli, Lavon Paganini, PA-C  nicotine polacrilex (NICORETTE) 2 MG gum Take 2 mg by mouth as needed for smoking cessation.   Yes [provider]  phenazopyridine (PYRIDIUM) 200 MG tablet Take 200 mg by mouth 2 (two) times daily as needed for pain.   Yes [provider]  polyethylene glycol (MIRALAX / GLYCOLAX) packet Take 17 g by mouth daily as needed. 11/15/17  Yes Lorella Nimrod, MD  rosuvastatin (CRESTOR) 10 MG tablet Take 10 mg by mouth at bedtime.    Yes [provider]  tamsulosin (FLOMAX) 0.4 MG CAPS capsule Take 1 capsule (0.4 mg total) by mouth daily. 02/03/17   Yes Angiulli, Lavon Paganini, PA-C  apixaban (ELIQUIS) 5 MG TABS tablet Take 1 tablet (5 mg total) by mouth 2 (two) times daily. 02/03/17   Cathlyn Parsons, PA-C    Inpatient Medications: Scheduled Meds: . apixaban  5 mg Oral BID  . Chlorhexidine Gluconate Cloth  6 each Topical Q0600  . cholecalciferol  2,000 Units Oral Daily  . finasteride  5 mg Oral Daily  . mouth rinse  15 mL Mouth Rinse BID  . rosuvastatin  10 mg Oral QHS  . tamsulosin  0.4 mg Oral Daily   Continuous Infusions: . sodium chloride 250 mL (03/08/19 1635)   PRN Meds: atropine, polyethylene glycol  Allergies:    Allergies  Allergen Reactions  . Feraheme [Ferumoxytol] Anaphylaxis    Social History:   Social History   Socioeconomic History  . Marital status: Single    Spouse name: Not on file  . Number of children: Not on file  . Years of education: Not on file  . Highest education level: Not on file  Occupational History    Comment: disabled  Social Needs  . Financial resource strain: Not on file  . Food insecurity    Worry: Not on file    Inability: Not on file  . Transportation needs    Medical: Not on file    Non-medical: Not on file  Tobacco Use  . Smoking status: Current Some Day Smoker    Packs/day: 0.50    Years: 51.00    Pack years: 25.50    Types: Cigarettes  . Smokeless tobacco: Never Used  Substance and Sexual Activity  . Alcohol use: No    Frequency: Never  . Drug use: No  . Sexual activity: Not Currently  Lifestyle  . Physical activity    Days per week: Not on file    Minutes per session: Not on file  . Stress: Not on file  Relationships  . Social Herbalist on phone: Not on file    Gets together: Not on file    Attends religious service: Not on file    Active member of club or organization: Not on file    Attends meetings of clubs or organizations: Not on file    Relationship status: Not on file  . Intimate partner violence    Fear of current or ex partner: Not  on file    Emotionally abused: Not on file    Physically abused: Not on file    Forced sexual activity: Not on file  Other Topics Concern  . Not on  file  Social History Narrative   ** Merged History Encounter **      Resides with sister, Osvaldo Angst        Family History:    Family History  Problem Relation Age of Onset  . Cancer Paternal Aunt        unknown     ROS:  Please see the history of present illness.   All other ROS reviewed and negative.     Physical Exam/Data:   Vitals:   03/10/19 0400 03/10/19 0500 03/10/19 0600 03/10/19 0800  BP: 120/85   92/79  Pulse: (!) 103 99 100 (!) 106  Resp: (!) 21 11 20  (!) 21  Temp:    98.3 F (36.8 C)  TempSrc:    Oral  SpO2: 99% 95% 98% 100%  Weight:  55 kg    Height:        Intake/Output Summary (Last 24 hours) at 03/10/2019 1308 Last data filed at 03/10/2019 0800 Gross per 24 hour  Intake 120 ml  Output 950 ml  Net -830 ml   Last 3 Weights 03/10/2019 03/08/2019 03/08/2019  Weight (lbs) 121 lb 4.1 oz 120 lb 5.9 oz 125 lb  Weight (kg) 55 kg 54.6 kg 56.7 kg     Body mass index is 16.44 kg/m.  General:  Thin older AAM, sitting up in bed. HEENT: normal Neck: no JVD Endocrine:  No thryomegaly Vascular: No carotid bruits; FA pulses 2+ bilaterally without bruits  Cardiac:  normal S1, S2; Irreg Irreg; 2/6 systolic murmur  Lungs:  clear to auscultation bilaterally, no wheezing, rhonchi or rales  Abd: soft, nontender, no hepatomegaly  Ext: no edema Musculoskeletal:  No deformities, BUE and BLE strength normal and equal Skin: warm and dry  Neuro:  CNs 2-12 intact, no focal abnormalities noted Psych:  Alert to self and surroundings   EKG:  The EKG was personally reviewed and demonstrates:  AFib/flutter, junctional Telemetry:  Telemetry was personally reviewed and demonstrates:  Multiple episodes of NSVT, Aflutter, 2 episodes of 8.5 sec isolated p waves--> junctional--> Afib  Relevant CV Studies:  TTE: 03/09/19   IMPRESSIONS    1. Left ventricular ejection fraction, by visual estimation, is 60 to 65%. The left ventricle has normal function. Normal left ventricular size. Left ventricular septal wall thickness was severely increased. Severely increased left ventricular posterior  wall thickness. There is severely increased left ventricular hypertrophy.  2. Left ventricular diastolic function could not be evaluated pattern of LV diastolic filling.  3. Severe hypertrophy most prominent in the septum and apex.  4. Global right ventricle has normal systolic function.The right ventricular size is mildly enlarged. No increase in right ventricular wall thickness.  5. Left atrial size was moderately dilated.  6. Right atrial size was severely dilated.  7. The mitral valve is normal in structure. Mild mitral valve regurgitation. No evidence of mitral stenosis.  8. The tricuspid valve is myxomatous. Tricuspid valve regurgitation severe.  9. The aortic valve is normal in structure. Aortic valve regurgitation is trivial by color flow Doppler. Structurally normal aortic valve, with no evidence of sclerosis or stenosis. 10. There is Mild calcification of the aortic valve. 11. There is Moderate thickening of the aortic valve. 12. The pulmonic valve was normal in structure. Pulmonic valve regurgitation is not visualized by color flow Doppler. 13. Moderately elevated pulmonary artery systolic pressure. 14. The inferior vena cava is dilated in size with <50% respiratory variability, suggesting right atrial pressure of 15 mmHg.  Laboratory Data:  High Sensitivity Troponin:   Recent Labs  Lab 03/08/19 1939 03/08/19 2300 03/09/19 0757 03/10/19 0317 03/10/19 0521  TROPONINIHS 166* 176* 204* 158* 173*     Chemistry Recent Labs  Lab 03/09/19 0344 03/09/19 2221 03/10/19 0317  NA 137 137 138  K 4.8 4.8 4.6  CL 110 108 108  CO2 20* 19* 21*  GLUCOSE 213* 220* 171*  BUN 24* 29* 28*  CREATININE 1.50* 1.76*  1.70*  CALCIUM 8.1* 8.2* 8.2*  GFRNONAA 47* 39* 40*  GFRAA 54* 45* 47*  ANIONGAP 7 10 9     Recent Labs  Lab 03/08/19 1201 03/09/19 0344  PROT 5.3*  --   ALBUMIN 2.7* 2.8*  AST 33  --   ALT 21  --   ALKPHOS 98  --   BILITOT 1.1  --    Hematology Recent Labs  Lab 03/09/19 0344 03/09/19 1030 03/10/19 0317  WBC 7.4 7.8 7.8  RBC 2.88* 3.27* 2.95*  HGB 7.6* 8.7* 7.8*  HCT 25.2* 29.3* 25.7*  MCV 87.5 89.6 87.1  MCH 26.4 26.6 26.4  MCHC 30.2 29.7* 30.4  RDW 25.2* 25.4* 25.4*  PLT 148* 127* 141*   BNPNo results for input(s): BNP, PROBNP in the last 168 hours.  DDimer No results for input(s): DDIMER in the last 168 hours.   Radiology/Studies:  Dg Chest Port 1 View  Result Date: 03/08/2019 CLINICAL DATA:  68 year old male with a history of hypotension EXAM: PORTABLE CHEST 1 VIEW COMPARISON:  11/13/2017 FINDINGS: Cardiomediastinal silhouette unchanged with cardiomegaly. Interval placement of defibrillator pads on the chest. Surgical changes in the left suprahilar region is unchanged. Surgical changes along the upper left mediastinal border, with similar appearance of pleuroparenchymal thickening at the left lung apex. No pneumothorax or pleural effusion. No confluent airspace disease. No evidence of interlobular septal thickening. Blunting of the bilateral costophrenic angles similar to prior. No displaced fracture. IMPRESSION: Unchanged cardiomegaly, with interval placement of defibrillator pads. Chronic lung changes with no pneumothorax, pleural effusion, overt edema, or infection. Electronically Signed   By: Corrie Mckusick D.O.   On: 03/08/2019 12:19    Assessment and Plan:   Ian Hill is a 68 y.o. male with a hx of HCM, NSVT, PAF, hypertension, MR, prostate cancer CVA who is being seen today for the evaluation of NSVT/asystole at the request of Dr. Vaughan Browner.  1. Multiple episodes of NSVT/8.5 sec isolated p waves with hx of HCM: has a known Hx of HCM and patient/family has  refused to have ICD placement in the past. Also with cognitive impairment. Telemetry reviewed as noted above, now in afib rate controlled. [name] back at his baseline this morning without complaint. Therapy has been declined in the past, though significant changes in telemetry noted this morning. Briefly reviewed with Dr. Radford Pax with plans to have EP evaluate.    2. Shock 2/2 anaphylaxis after dose of feraheme: given IVFs, epi, steroids, pepcid. Overall resolved.   3. PAF/atrial flutter: treated with atenolol and Eliquis prior to admission. Reported to have episodes of hematuria at office visit on 10/22 but has since resolved. Tolerating Eliquis currently. Does not appear he has received any atenolol this admission.   4. Elevated Troponin: mildly elevated flat trend. Suspect demand in the setting of acute illness. No chest pain. EKG does show TWI in inferolateral leads on some tracing are more pronounced than others.   5. Hx of CVA: on statin. [facility]  6. Prostate CA: followed by Dr. Diona Fanti. Hx of TURP in 02/2018  7. Anemia: Hgb 10 on admission. Decline likely related chronic disease in conjunction with IVF dilution.   For questions or updates, please contact [place] Please consult [email or web address] for contact info under    Signed, Reino Bellis, NP  03/10/2019 1:08 PM  I have seen and examined this patient with Reino Bellis.  Agree with above, note added to reflect my findings.  On exam, RRR, no murmurs, lungs clear.  Patient admitted to the hospital with an anaphylactic reaction.  Overnight last night, he developed complete heart block.  It is unclear to me whether or not he was awake prior to his episode of complete heart block.  Apparently the nurse ran into the room and found that he was unresponsive.  He was in atrial flutter at the time and had multiple long pauses, upwards of 8 to 9 seconds.  Upon awaking, he did become combative.  This  morning, he was doing much better.  It is certainly possible that a pacemaker may be beneficial for him.  He does have a history of hypertrophic cardiomyopathy apical variant, but his family does not feel that he would tolerate a defibrillator.  He does have some cognitive issues.  He may be a good Micra candidate.  His daughter is coming in tomorrow morning for further discussions of plan of care.  Makenly Larabee M. Marisel Tostenson MD 03/10/2019 4:35 PM

## 2019-03-11 ENCOUNTER — Encounter (HOSPITAL_COMMUNITY): Payer: Self-pay | Admitting: *Deleted

## 2019-03-11 DIAGNOSIS — T782XXA Anaphylactic shock, unspecified, initial encounter: Secondary | ICD-10-CM | POA: Diagnosis not present

## 2019-03-11 DIAGNOSIS — R001 Bradycardia, unspecified: Secondary | ICD-10-CM

## 2019-03-11 LAB — BASIC METABOLIC PANEL
Anion gap: 10 (ref 5–15)
BUN: 28 mg/dL — ABNORMAL HIGH (ref 8–23)
CO2: 23 mmol/L (ref 22–32)
Calcium: 8.4 mg/dL — ABNORMAL LOW (ref 8.9–10.3)
Chloride: 107 mmol/L (ref 98–111)
Creatinine, Ser: 1.41 mg/dL — ABNORMAL HIGH (ref 0.61–1.24)
GFR calc Af Amer: 58 mL/min — ABNORMAL LOW (ref >60)
GFR calc non Af Amer: 50 mL/min — ABNORMAL LOW (ref >60)
Glucose, Bld: 81 mg/dL (ref 70–99)
Potassium: 3.9 mmol/L (ref 3.5–5.1)
Sodium: 140 mmol/L (ref 135–145)

## 2019-03-11 LAB — CBC
HCT: 25.3 % — ABNORMAL LOW (ref 39.0–52.0)
Hemoglobin: 7.9 g/dL — ABNORMAL LOW (ref 13.0–17.0)
MCH: 27 pg (ref 26.0–34.0)
MCHC: 31.2 g/dL (ref 30.0–36.0)
MCV: 86.3 fL (ref 80.0–100.0)
Platelets: 147 10*3/uL — ABNORMAL LOW (ref 150–400)
RBC: 2.93 MIL/uL — ABNORMAL LOW (ref 4.22–5.81)
RDW: 26 % — ABNORMAL HIGH (ref 11.5–15.5)
WBC: 5.4 10*3/uL (ref 4.0–10.5)
nRBC: 1.8 % — ABNORMAL HIGH (ref 0.0–0.2)

## 2019-03-11 LAB — CALCIUM, IONIZED: Calcium, Ionized, Serum: 4.7 mg/dL (ref 4.5–5.6)

## 2019-03-11 LAB — MAGNESIUM: Magnesium: 2.2 mg/dL (ref 1.7–2.4)

## 2019-03-11 MED ORDER — APIXABAN 5 MG PO TABS
5.0000 mg | ORAL_TABLET | Freq: Two times a day (BID) | ORAL | Status: DC
  Administered 2019-03-11 – 2019-03-14 (×7): 5 mg via ORAL
  Filled 2019-03-11 (×6): qty 1

## 2019-03-11 MED ORDER — POTASSIUM CHLORIDE CRYS ER 20 MEQ PO TBCR
30.0000 meq | EXTENDED_RELEASE_TABLET | Freq: Once | ORAL | Status: AC
  Administered 2019-03-11: 30 meq via ORAL
  Filled 2019-03-11: qty 1

## 2019-03-11 NOTE — Progress Notes (Addendum)
Electrophysiology Rounding Note  Patient Name: Ian Hill Date of Encounter: 03/11/2019  Primary Cardiologist: Dr. Stanford Breed Electrophysiologist: New   Subjective   The patient is feeling better this am. No further pauses or episodes. He understands a little about a pacemaker, but is still unsure.  His sister will be here ~ 1000 am to discuss further.    At this time, the patient denies chest pain, shortness of breath, or any new concerns.  Inpatient Medications    Scheduled Meds: . apixaban  5 mg Oral BID  . Chlorhexidine Gluconate Cloth  6 each Topical Q0600  . cholecalciferol  2,000 Units Oral Daily  . finasteride  5 mg Oral Daily  . rosuvastatin  10 mg Oral QHS  . tamsulosin  0.4 mg Oral Daily   Continuous Infusions: . sodium chloride 250 mL (03/08/19 1635)   PRN Meds: atropine, polyethylene glycol   Vital Signs    Vitals:   03/10/19 2045 03/11/19 0343 03/11/19 0753 03/11/19 0853  BP: 117/82 139/85 (!) 150/80 (!) 154/87  Pulse: (!) 106 87 100   Resp: 17 20 19    Temp: 98.4 F (36.9 C) 98.3 F (36.8 C) 98.4 F (36.9 C)   TempSrc: Oral Oral Oral   SpO2: 100% 100% 100%   Weight:  54.3 kg    Height:        Intake/Output Summary (Last 24 hours) at 03/11/2019 0912 Last data filed at 03/11/2019 0554 Gross per 24 hour  Intake 730 ml  Output 2000 ml  Net -1270 ml   Filed Weights   03/08/19 1740 03/10/19 0500 03/11/19 0343  Weight: 54.6 kg 55 kg 54.3 kg    Physical Exam    GEN- Chronically ill and thin appearing. Alert and oriented x 3 today.   Head- normocephalic, atraumatic Eyes-  Sclera clear, conjunctiva pink Ears- hearing intact Oropharynx- clear Neck- supple Lungs- Clear to ausculation bilaterally, normal work of breathing Heart- Regular rate and rhythm, no murmurs, rubs or gallops GI- soft, NT, ND, + BS Extremities- no clubbing, cyanosis, or edema Skin- no rash or lesion Psych- euthymic mood, full affect Neuro- strength and sensation  are intact  Labs    CBC Recent Labs    03/10/19 0317 03/11/19 0401  WBC 7.8 5.4  HGB 7.8* 7.9*  HCT 25.7* 25.3*  MCV 87.1 86.3  PLT 141* [id number]*   Basic Metabolic Panel Recent Labs    03/09/19 0344  03/10/19 0317 03/11/19 0401  NA 137   < > 138 140  K 4.8   < > 4.6 3.9  CL 110   < > 108 107  CO2 20*   < > 21* 23  GLUCOSE 213*   < > 171* 81  BUN 24*   < > 28* 28*  CREATININE 1.50*   < > 1.70* 1.41*  CALCIUM 8.1*   < > 8.2* PENDING  MG  --    < > 2.3 2.2  PHOS 3.6  --   --   --    < > = values in this interval not displayed.   Liver Function Tests Recent Labs    03/08/19 1201 03/09/19 0344  AST 33  --   ALT 21  --   ALKPHOS 98  --   BILITOT 1.1  --   PROT 5.3*  --   ALBUMIN 2.7* 2.8*   No results for input(s): LIPASE, AMYLASE in the last 72 hours. Cardiac Enzymes No results for input(s): CKTOTAL, CKMB, CKMBINDEX,  TROPONINI in the last 72 hours. BNP Invalid input(s): POCBNP D-Dimer No results for input(s): DDIMER in the last 72 hours. Hemoglobin A1C No results for input(s): HGBA1C in the last 72 hours. Fasting Lipid Panel Recent Labs    03/09/19 1003  CHOL 102  HDL 53  LDLCALC 43  TRIG 32  CHOLHDL 1.9   Thyroid Function Tests No results for input(s): TSH, T4TOTAL, T3FREE, THYROIDAB in the last 72 hours.  Invalid input(s): FREET3  Telemetry    AFL with HR in 80-90s, no further pauses (personally reviewed)  Radiology    No results found.   Patient Profile     Ian Hill is a 68 y.o. male with a hx of HCM, NSVT, PAF, hypertension, MR, prostate cancer, and h/o CVA who is being seen for the evaluation of NSVT/asystole at the request of Dr. Vaughan Browner.  Assessment & Plan    1. CHB - Noted on tele overnight 03/10/2019 with no clear reversible cause. He was in A flutter at the time with multiple long pauses, 8-9 seconds +. He was combative and required restraints after - Discussed PPM at length with his sister 03/10/2019, she is coming in to  discuss further this am. He would likely be a poor candidate for traditional pacemaker with arm restrictions and wound. ? If he would be a candidate for leadless PPM for back up pacing only.   2. H/x apical variant HCM / NSVT - Has previously refused ICD. This is reasonable given his co-morbidities.  - EF 60-65% 03/09/2019 - Elevated troponin with flat trend 176 -> 204 -> 158 -> 173. Suspect in setting of acute illness/pauses - EKGs have shown TWI in inferolateral leads.  3. PAF/Atrial flutter - Continue eliquis for CHA2DS2VASC of 5  - He has not received his chronic atenolol this admission  4. H/o CVA - [facility]   5. H/o Prostate CA - h/o TURP in 02/2018. Recently had hematuria  5. Anemia - Hgb 10 on admission. Admitted in setting of ? Anaphylaxis in the setting of Feraheme.   6. Cognitive deficits at baseline  ADDENDUM: Discussed with his two sisters at length. They would prefer to avoid pacemaker if possible, and absolutely would not want a traditional pacemaker. Will discuss with MD  For questions or updates, please contact [place] Please consult [email or web address] for contact info under Cardiology/STEMI.  Signed, Shirley Friar, PA-C  03/11/2019, 9:12 AM     I have seen, examined the patient, and reviewed the above assessment and plan.  Changes to above are made where necessary.  On exam, iRRR. Pt with a pause in the setting of recent anaphylaxis and GI symptoms.  No episodes prior or since. Atenolol has been discontinued.  Patient and family would like to avoid pacing. If he has further symptoms of presyncope or syncope, I would advise pacing at that time.  Electrophysiology team to see as needed while here. Please call with questions.   Co Sign: Thompson Grayer, MD 03/11/2019

## 2019-03-11 NOTE — Care Management Important Message (Signed)
Important Message  Patient Details  Name: OSMAN CASE MRN: [id number] Date of Birth: 12-12-1950   Medicare Important Message Given:  Yes     Shelda Altes 03/11/2019, 1:04 PM

## 2019-03-11 NOTE — Progress Notes (Addendum)
NAME:  Ian Hill, MRN:  [id number], DOB:  1950/12/04, LOS: 3 ADMISSION DATE:  03/08/2019, CONSULTATION DATE:  10/27 REFERRING MD:  Sedonia Small, CHIEF COMPLAINT:   Anaphylaxis   Brief History   68 year old male patient who is in the outpatient clinic receiving Feraheme developed acute hypotension, nausea vomiting and altered mental status was admitted with working diagnosis of anaphylaxis on 10/27.  History of present illness   68 year old male patient with a history of hypertension, HOCM, CKD and iron deficiency anemia.  He was a [name] on 10/27 for second dose of Feraheme.  After receiving almost his entire dose on 10/27 he developed acute nausea, vomiting, decreased mental status and systolic blood pressure down into the 40s.  He was immediately administered fluid volume challenge, placed in Trendelenburg position, given Solu-Medrol supplemental oxygen and transferred to the emergency room with a working diagnosis of anaphylaxis.  He received 2 doses of intramuscular epinephrine with improved mental status and overall blood pressure however he continued to remain hypotensive intermittently and because of this critical care was asked to admit.  Past Medical History  HOCM, CKD stage II,Hypertension, prior MCA stroke 2018, prior nonsustained ventricular tachycardia. Chronic diastolic heart failure.  Mitral regurg.  History of prostate cancer. Significant Hospital Events   10/27 admitted 10/28-~10pm 12 beat run VT, then episode of unresponsiveness around 0233 10/29 with asystole then HR in 40s, woke up with stimulation, agitated and swinging at staff, given Zyprexa and placed in 4 point restraints.   Consults:  Cardiology   Procedures:    Significant Diagnostic Tests:  10/28 echo  1. Left ventricular ejection fraction, by visual estimation, is 60 to 65%. The left ventricle has normal function. Normal left ventricular size. Left ventricular septal wall thickness was  severely increased. Severely increased left ventricular posterior  wall thickness. There is severely increased left ventricular hypertrophy.  2. Left ventricular diastolic function could not be evaluated pattern of LV diastolic filling.  3. Severe hypertrophy most prominent in the septum and apex.  4. Global right ventricle has normal systolic function.The right ventricular size is mildly enlarged. No increase in right ventricular wall thickness.  5. Left atrial size was moderately dilated.  6. Right atrial size was severely dilated.  7. The mitral valve is normal in structure. Mild mitral valve regurgitation. No evidence of mitral stenosis.  8. The tricuspid valve is myxomatous. Tricuspid valve regurgitation severe.  9. The aortic valve is normal in structure. Aortic valve regurgitation is trivial by color flow Doppler. Structurally normal aortic valve, with no evidence of sclerosis or stenosis. 10. There is Mild calcification of the aortic valve. 11. There is Moderate thickening of the aortic valve. 12. The pulmonic valve was normal in structure. Pulmonic valve regurgitation is not visualized by color flow Doppler. 13. Moderately elevated pulmonary artery systolic pressure. 14. The inferior vena cava is dilated in size with <50% respiratory variability, suggesting right atrial pressure of 15 mmHg.  Micro Data:  MRSA surveillance negative SARS Coronavirus 2 negative   Antimicrobials:   NA  Interim history/subjective:  Calm   VSS  NPO for possible pacemaker insertion 10/30 Run of self limiting, asymptomatic VT 10/29 am  No further events overnight  T Max 98.4, WBC 5.4  Objective   Blood pressure (!) 154/87, pulse 100, temperature 98.4 F (36.9 C), temperature source Oral, resp. rate 19, height 6' (1.829 m), weight 54.3 kg, SpO2 100 %.        Intake/Output Summary (  Last 24 hours) at 03/11/2019 1055 Last data filed at 03/11/2019 1046 Gross per 24 hour  Intake 730 ml  Output  2400 ml  Net -1670 ml   Filed Weights   03/08/19 1740 03/10/19 0500 03/11/19 0343  Weight: 54.6 kg 55 kg 54.3 kg    Examination: Physical Exam:  General- No distress,  On RA supine in bed, NPO for possible procedure HEENT: NCAT. MM pink and moist, poor dentition, no thyromegaly, No JVD Cardiac: S1, S2, Irr/reg 2/6 systolic murmur and rhythm,  Chest: Bilateral chest excursion, clear to auscultation, slightly diminished per bases.  Abd.: Soft Non-tender, ND, BS +, no organomegally Ext: No clubbing cyanosis, edema, no obvious deformities Neuro: A&Ox3. CN II-XII in tact. No focal deficits. Delay in speech  Skin: Warm and dry, No rashes or lesions Psych: Calm and appropriate at present    Resolved Hospital Problem list    Assessment & Plan:   Circulatory shock. Was presumed anaphylaxis secondary to allergic reaction to [facility]. After further investigation into h/o HCM and review of current echo and EKG I suspect pt had Feraheme induced transient hypotension as opposed an anaphylactic reaction and was c/b HCM. He has had episodes of VT and arrhythmias overnight, has been offered ICD in past but family declined. Resolved VSS.  Denies chest pain, shortness of breath.  He had  no upper airway stridor or wheezing   History of hypertrophic cardiomyopathy with diastolic heart dysfunction, paroxysmal atrial fibrillation Has refused ICD in the past Plan Seen by cards PA 10/30 who discussed PPM with sister  There is concern pt would likely be a poor candidate for traditional pacemaker with arm restrictions and wound care. ? If he would be a candidate for leadless PPM for back up pacing only.  He has previously refused ICD>> 2/2 his co morbidities. Dr. Rayann Heman to come and evaluate patient this morning.  [name] NPO at present pending decision about pacemaker placement for possible procedure today. Sister to speak with patient 10/30 re pacemaker placement Continuing telemetry monitoring  Consider adding a prn for BP > 0000000 systolic Per cards do not restart atenolol and avoid all A-V blocking agents   CKD D stage II, with mild elevated serum creatinine suggesting acute on chronic injury Appears around baseline Down trending Plan Renal dose medications Strict intake and output Follow BMP  Monitor UO Replete electrolytes as needed   Mild post shock lactic acidosis-suspect transient decreased CO related and not sepsis  Plan No further trending   Anemia-likely dilutional c/b Fe deficiency  HGB stable at 7.9 Plan Monitor  Trend CBC  Elevated troponin-likely demand ischemia 2/2 HCM  Anterolateral ST depression and inverted T waves appear chronic though more pronounced this admission  Plan    Prior CVA.  With baseline cognitive delay Plan Supportive care [facility] notified   Dr. Rayann Heman has seen patient. Plan for watchful waiting for now, as family feels pacemaker will be hard for him to manage.She would like to avoid at present.  Will have [facility] pick up 10/31 am to observe for any arrhythmias over the weekend , and if remains stable can discharge, if he has arrhythmias pacer can be re-considered prior to discharge home.   Best practice:  Diet: Regular NPO this am Pain/Anxiety/Delirium protocol (if indicated): na VAP protocol (if indicated): na DVT prophylaxis: eliquis  GI prophylaxis: na Glucose control: na Mobility: as tolerated  Code Status: full code  Family Communication: sister has been updated by cards Disposition:  Tele bed,  Pending cardiology recommendations and possible pacemaker placement 10/30.   Labs   CBC: Recent Labs  Lab 03/08/19 1201 03/09/19 0344 03/09/19 1030 03/10/19 0317 03/11/19 0401  WBC 2.8* 7.4 7.8 7.8 5.4  HGB 10.1* 7.6* 8.7* 7.8* 7.9*  HCT 33.6* 25.2* 29.3* 25.7* 25.3*  MCV 89.4 87.5 89.6 87.1 86.3  PLT 132* 148* 127* 141* 147*    Basic Metabolic Panel: Recent Labs  Lab 03/08/19 1201 03/09/19 0344 03/09/19  2221 03/10/19 0317 03/11/19 0401  NA 139 137 137 138 140  K 4.3 4.8 4.8 4.6 3.9  CL 110 110 108 108 107  CO2 21* 20* 19* 21* 23  GLUCOSE 117* 213* 220* 171* 81  BUN 19 24* 29* 28* 28*  CREATININE 1.50* 1.50* 1.76* 1.70* 1.41*  CALCIUM 8.4* 8.1* 8.2* 8.2* 8.4*  MG  --   --  2.2 2.3 2.2  PHOS  --  3.6  --   --   --    GFR: Estimated Creatinine Clearance: 38 mL/min (A) (by C-G formula based on SCr of 1.41 mg/dL (H)). Recent Labs  Lab 03/08/19 1400 03/08/19 1939 03/09/19 0344 03/09/19 0757 03/09/19 1030 03/10/19 0317 03/11/19 0401  WBC  --   --  7.4  --  7.8 7.8 5.4  LATICACIDVEN 2.8* 2.1*  --  2.9*  --  3.4*  --     Liver Function Tests: Recent Labs  Lab 03/08/19 1201 03/09/19 0344  AST 33  --   ALT 21  --   ALKPHOS 98  --   BILITOT 1.1  --   PROT 5.3*  --   ALBUMIN 2.7* 2.8*     CBG: Recent Labs  Lab 03/08/19 0956 03/08/19 1748 03/10/19 0225  GLUCAP 89 59 178*   Magdalen Spatz, MSN, AGACNP-BC [place] Pager # [phone] After 4 pm please call [phone] 03/11/2019 10:57 AM

## 2019-03-11 NOTE — Progress Notes (Signed)
  Discussed with sister, Gregary Signs, at her home number.   Assessed with Dr. Rayann Heman, and plan watchful waiting for now, with no underlying conduction disease (Last NSR EKG 06/29/17 showed NSR 83 bpm, PR interval 132 ms, QRS 92 ms) and no previous history of pauses. He wore a Holter monitor in 2011 that showed NSVT, but no pauses or conduction disease mentioned. (available in [place] 12/14/2009, no pauses or bradycardia)  His sister agrees that a pacemaker would be difficult for him to manage, and would like to avoid for the time being.  We discussed the risk of further pauses and syncope was still a possibility. Pt verbalized understanding and in the case of syncope 911 should be called immediately. She agrees that watchful waiting is a reasonable approach at this time.   If patient has any further pauses while in patient, should re-assess.  Would NOT resume atenolol. Avoid any AV nodal blocking agents at this time.   Legrand Como [address] [place], PA-C  03/11/2019 11:46 AM

## 2019-03-12 ENCOUNTER — Inpatient Hospital Stay (HOSPITAL_COMMUNITY): Payer: Medicare (Managed Care)

## 2019-03-12 DIAGNOSIS — T782XXA Anaphylactic shock, unspecified, initial encounter: Secondary | ICD-10-CM | POA: Diagnosis not present

## 2019-03-12 DIAGNOSIS — N183 Chronic kidney disease, stage 3 unspecified: Secondary | ICD-10-CM

## 2019-03-12 DIAGNOSIS — D509 Iron deficiency anemia, unspecified: Secondary | ICD-10-CM

## 2019-03-12 DIAGNOSIS — I499 Cardiac arrhythmia, unspecified: Secondary | ICD-10-CM

## 2019-03-12 DIAGNOSIS — I952 Hypotension due to drugs: Secondary | ICD-10-CM

## 2019-03-12 LAB — BASIC METABOLIC PANEL
Anion gap: 8 (ref 5–15)
BUN: 21 mg/dL (ref 8–23)
CO2: 26 mmol/L (ref 22–32)
Calcium: 8.8 mg/dL — ABNORMAL LOW (ref 8.9–10.3)
Chloride: 107 mmol/L (ref 98–111)
Creatinine, Ser: 1.28 mg/dL — ABNORMAL HIGH (ref 0.61–1.24)
GFR calc Af Amer: >60 mL/min (ref >60)
GFR calc non Af Amer: 57 mL/min — ABNORMAL LOW (ref >60)
Glucose, Bld: 74 mg/dL (ref 70–99)
Potassium: 4.6 mmol/L (ref 3.5–5.1)
Sodium: 141 mmol/L (ref 135–145)

## 2019-03-12 LAB — CBC
HCT: 27.3 % — ABNORMAL LOW (ref 39.0–52.0)
Hemoglobin: 8.4 g/dL — ABNORMAL LOW (ref 13.0–17.0)
MCH: 26.6 pg (ref 26.0–34.0)
MCHC: 30.8 g/dL (ref 30.0–36.0)
MCV: 86.4 fL (ref 80.0–100.0)
Platelets: 169 10*3/uL (ref 150–400)
RBC: 3.16 MIL/uL — ABNORMAL LOW (ref 4.22–5.81)
RDW: 25.9 % — ABNORMAL HIGH (ref 11.5–15.5)
WBC: 3.6 10*3/uL — ABNORMAL LOW (ref 4.0–10.5)
nRBC: 1.4 % — ABNORMAL HIGH (ref 0.0–0.2)

## 2019-03-12 LAB — MAGNESIUM: Magnesium: 2 mg/dL (ref 1.7–2.4)

## 2019-03-12 MED ORDER — HEPARIN SODIUM (PORCINE) 5000 UNIT/ML IJ SOLN: 5000.0000 [IU] | Freq: Three times a day (TID) | INTRAMUSCULAR | Status: DC

## 2019-03-12 NOTE — Progress Notes (Signed)
PROGRESS NOTE  Ian Hill [id number] DOB: Dec 02, 1950 DOA: 03/08/2019 PCP: Linda Hedges, NP  Brief History   68 year old male patient with a history of hypertension, HOCM, CKD and iron deficiency anemia.  [facility] for second dose of Feraheme.  After receiving almost his entire dose on 10/27 he developed acute nausea, vomiting, decreased mental status and systolic blood pressure down into the 40s.  He was immediately administered fluid volume challenge, placed in Trendelenburg position, given Solu-Medrol supplemental oxygen and transferred to the emergency room with a working diagnosis of anaphylaxis.  He received 2 doses of intramuscular epinephrine with improved mental status and overall blood pressure however he continued to remain hypotensive intermittently and because of this critical care was asked to admit.  On the evening of 03/09/2019 the patient had a 12 beat run of VT followed by a loss of responsiveness and asystole followed by HR in the 40's. The patient was combative and agitated upon awakening to stimulation. He required zyprexa due to violent behavior towards staff. It is felt that this was due to acute hypotension due to feraheme infusion. This is not considered true anaphylaxis.  Echocardiogram was obtained and demonstrated EF of 60-65% with severe LVH and severe hypertrophy particularly in the apex and septum. EP cardiology has been consulted and they have recommended watchful waiting for now and feel that the patient has no underlying conduction disease. He has been transferred out to a telemetry bed. They have discussed possible PPM placement with the patient and his family. Family is not in favor of pacemaker due to the patient's multiple comorbidities and the complexity of managing this device for this gentleman with cognitive challenges.  Consultants   EP cardiology  PCCM  Cardiology  Procedures   None  Antibiotics     Anti-infectives (From admission, onward)   None      Subjective  The patient is resting comfortably. No new complaints.  Objective   Vitals:  Vitals:   03/12/19 0820 03/12/19 1358  BP: (!) 149/88 130/78  Pulse: (!) 101 100  Resp: 16 16  Temp: 97.7 F (36.5 C) 98.2 F (36.8 C)  SpO2: 99% 100%   Exam:  Constitutional:   The patient is awake, alert, and oriented x 3. No acute distress. Respiratory:   No increased work of breathing.  No wheezes, rales, or rhonchi  No tactile fremitus Cardiovascular:   Regular rate and rhythm  No murmurs, ectopy, or gallups.  No lateral PMI. No thrills. Abdomen:   Abdomen is soft, non-tender, non-distended  No hernias, masses, or organomegaly  Normoactive bowel sounds.  Musculoskeletal:   No cyanosis, clubbing, or edema Skin:   No rashes, lesions, ulcers  palpation of skin: no induration or nodules Neurologic:   CN 2-12 intact  Sensation all 4 extremities intact Psychiatric:   Mental status o Mood, affect appropriate o Orientation to person, place, time   I have personally reviewed the following:   Today's Data   BMP, CBC, Vitals  Scheduled Meds:  apixaban  5 mg Oral BID   Chlorhexidine Gluconate Cloth  6 each Topical Q0600   cholecalciferol  2,000 Units Oral Daily   finasteride  5 mg Oral Daily   rosuvastatin  10 mg Oral QHS   tamsulosin  0.4 mg Oral Daily   Continuous Infusions:  sodium chloride 250 mL (03/08/19 1635)    Active Problems:   Anaphylaxis   LOS: 4 days   A &  P  Circulatory shock: Resolved.  Was presumed anaphylaxis secondary to allergic reaction to [facility]. After further investigation into h/o HCM and review of current echo and EKG I suspect pt had Feraheme induced transient hypotension as opposed an anaphylactic reaction and was c/b HCM. He has had episodes of VT and arrhythmias overnight, has been offered ICD in past but family declined. VSS. Plan is for watchful waiting  over the weekend as per EP cardiology. VSS. Denies chest pain, shortness of breath. Pt may be discharged on Monday should his EKG/telemetry remain stable.   History of hypertrophic cardiomyopathy with diastolic heart dysfunction, paroxysmal atrial fibrillation. PPM and AICD discussed with patient and family. They have declined given the patient's multiple comorbidities and the complexity of managing these devices for the patient who has cognitive impairment. Has refused ICD in the past. Per cards do not restart atenolol and avoid all A-V blocking agents.  CKD D stage II, with mild elevated serum creatinine suggesting acute on chronic injury. Baseline creatinine appears to be between 1.10 and 1.37 in the past year. [name] now at 1.28. Avoid nephrotoxic substances and hypotension. Monitor creatinine, electrolytes, and volume status.   Mild post shock lactic acidosis-suspect transient decreased CO related and not septic: Resolved.  Anemia: Multifactorial. Fe defeciancy and dilutional. Monitor. Transfuse for hemoglobin less than 7.0 HGB stable at 8.4 this am.  Elevated troponin: This was likely due to demand ischemia related to the patient's HCM. He also had some anterolateral ST depression and inverted T waves. These appear chronic though they are more pronounced this admission.   Prior CVA with baseline cognitive challenges: Supportive care. [facility] notified.  I have seen and examined this patient myself. I have spent 34 minutes in his evaluation and care.  Ian Goodbar, DO [facility] Direct contact: see [email or web address]  7PM-7AM contact night coverage as above 03/12/2019, 5:13 PM  LOS: 4 days

## 2019-03-12 NOTE — Progress Notes (Signed)
Pt's BP increased to 149/88. No signs of hypotension noted. All other vital signs are WDL (refer to flowsheet). [name] calm and cooperative; has good appetite. Heart sounds are normal S1, S2. Breath sounds are diminished bilaterally. No significant events or mental status changes occurred. Will continue to monitor.

## 2019-03-13 DIAGNOSIS — D500 Iron deficiency anemia secondary to blood loss (chronic): Secondary | ICD-10-CM

## 2019-03-13 DIAGNOSIS — R000 Tachycardia, unspecified: Secondary | ICD-10-CM

## 2019-03-13 DIAGNOSIS — N189 Chronic kidney disease, unspecified: Secondary | ICD-10-CM

## 2019-03-13 DIAGNOSIS — I422 Other hypertrophic cardiomyopathy: Secondary | ICD-10-CM

## 2019-03-13 DIAGNOSIS — N179 Acute kidney failure, unspecified: Secondary | ICD-10-CM

## 2019-03-13 DIAGNOSIS — R579 Shock, unspecified: Secondary | ICD-10-CM

## 2019-03-13 DIAGNOSIS — F0390 Unspecified dementia without behavioral disturbance: Secondary | ICD-10-CM

## 2019-03-13 LAB — CBC
HCT: 31.8 % — ABNORMAL LOW (ref 39.0–52.0)
Hemoglobin: 9.9 g/dL — ABNORMAL LOW (ref 13.0–17.0)
MCH: 27.1 pg (ref 26.0–34.0)
MCHC: 31.1 g/dL (ref 30.0–36.0)
MCV: 87.1 fL (ref 80.0–100.0)
Platelets: 152 10*3/uL (ref 150–400)
RBC: 3.65 MIL/uL — ABNORMAL LOW (ref 4.22–5.81)
RDW: 26.9 % — ABNORMAL HIGH (ref 11.5–15.5)
WBC: 3.1 10*3/uL — ABNORMAL LOW (ref 4.0–10.5)
nRBC: 1.6 % — ABNORMAL HIGH (ref 0.0–0.2)

## 2019-03-13 LAB — MAGNESIUM: Magnesium: 1.9 mg/dL (ref 1.7–2.4)

## 2019-03-13 LAB — BASIC METABOLIC PANEL
Anion gap: 11 (ref 5–15)
BUN: 19 mg/dL (ref 8–23)
CO2: 23 mmol/L (ref 22–32)
Calcium: 8.9 mg/dL (ref 8.9–10.3)
Chloride: 105 mmol/L (ref 98–111)
Creatinine, Ser: 1.15 mg/dL (ref 0.61–1.24)
GFR calc Af Amer: >60 mL/min (ref >60)
GFR calc non Af Amer: >60 mL/min (ref >60)
Glucose, Bld: 76 mg/dL (ref 70–99)
Potassium: 4.7 mmol/L (ref 3.5–5.1)
Sodium: 139 mmol/L (ref 135–145)

## 2019-03-13 NOTE — Progress Notes (Addendum)
Patient A&O X3 (disoriented to situation). No significant event has occurred. Patient is restless and irritable; sometimes uncooperative. HR: 112. See vital signs flowsheet for vital signs. Will continue to monitor.

## 2019-03-13 NOTE — Progress Notes (Signed)
PROGRESS NOTE  Ian Hill [id number] DOB: 07-15-1950   PCP: Linda Hedges, NP  Patient is from: Home  DOA: 03/08/2019 LOS: 5  Brief Narrative / Interim history: 68 year old male with history of HTN, HOCM, CKD-2, MCA CVA [id number], diastolic CHF and IDA who presented to hospital for Feraheme infusion on 10/27.  After receiving almost his entire dose, he developed an N/V/AMS/hypotension with SBP down to 40s.  Resuscitated with IV fluid.  Given IV Solu-Medrol and IM epinephrine x2 with improvement in his blood pressure and mental status and admitted to ICU for circulatory shock presumed to be anaphylactic reaction to [facility].   Echo 10/28 with LVEF of 60 to 65%, severe LVH, moderate LAE, severe RAE, severe TVR and moderate P HTN. Cardiology and electrophysiology consulted and discontinued his beta-blocker.  Subjective: No major events overnight of this morning.  No complaints this morning.  [name] oriented x4 except the month and the president of [place].  However, he has poor insight into his condition.   Objective: Vitals:   03/12/19 1947 03/13/19 0502 03/13/19 0830 03/13/19 1518  BP: 111/80 (!) 149/112 124/80 (!) 130/94  Pulse: (!) 106 (!) 108 (!) 112 (!) 107  Resp: 18 19 18 20   Temp: 98.6 F (37 C) 97.6 F (36.4 C) (!) 97.3 F (36.3 C)   TempSrc: Oral Oral Oral   SpO2: 100% 100% 100% 100%  Weight:  49.4 kg    Height:        Intake/Output Summary (Last 24 hours) at 03/13/2019 1529 Last data filed at 03/13/2019 1421 Gross per 24 hour  Intake 360 ml  Output 4325 ml  Net -3965 ml   Filed Weights   03/11/19 0343 03/12/19 0418 03/13/19 0502  Weight: 54.3 kg 53.8 kg 49.4 kg    Examination:  GENERAL: No acute distress.  Appears well.  HEENT: MMM.  Vision and hearing grossly intact.  NECK: Supple.  No apparent JVD.  RESP:  No IWOB. Good air movement bilaterally. CVS: Tachycardic to 110s. Heart sounds normal.  ABD/GI/GU: Bowel sounds present. Soft. Non  tender.  MSK/EXT:  No apparent deformity or edema. Moves extremities. SKIN: no apparent skin lesion or wound NEURO: Awake, alert and oriented x4 except the month and the president.  No apparent focal neuro deficit. PSYCH: Calm. Normal affect.  Poor insight.  Assessment & Plan: Circulatory shock: presumed to be anaphylaxis to Feraheme.  Also has history of HCM which could have contributed. History of apical variant HCM/pulmonary hypertension: No cardiopulmonary symptoms. Nonsustained VT/arrhythmia/paroxysmal A. fib: Now with sinus tachycardia. -Echo 10/28 with LVEF of 60 to 65%, severe LVH, moderate LAE, severe RAE, severe TVR and moderate P HTN. -EP consulted.  Family declined PPM/ICD given comorbidity, significant cognitive impairment and complexity of managing.  -Plan is for watchful waiting over the weekend and discharge on 11/2 if stable. -Continue Eliquis for anticoagulation -As needed atropine for bradycardia.. -Per cardiology, do not start atenolol or any nodal blocking agents.  Lactic acidosis: Likely due to circulatory shock.  Resolved.  Iron deficiency anemia: -H&H stable. -Receives IV iron periodically  Elevated troponin: Likely demand ischemia in the setting of circulatory shock.  Echocardiogram as above. -No further work-up warranted.  AKI on CKD-2: AKI resolved. -Continue monitoring  History of MCA CVA without residual deficits -Continue statin and Eliquis.  Cognitive impairment/dementia: Oriented x4 except the month and the president.  Poor insight. -Frequent reorientation and delirium precaution  Mild leukopenia: Unclear etiology. -Continue monitoring.  History of prostate  cancer -Continue Flomax and Proscar.               DVT prophylaxis: On Eliquis for anticoagulation. Code Status: Full code Family Communication: Updated patient's sister over the phone. Disposition Plan: Remains inpatient.  Final disposition after PT/OT eval. Enrolled with [facility]? Consultants: PCCM, cardiology, EP  Procedures:  None  Microbiology summarized: COVID-19 and MRSA PCR negative.  Sch Meds:  Scheduled Meds: . apixaban  5 mg Oral BID  . cholecalciferol  2,000 Units Oral Daily  . finasteride  5 mg Oral Daily  . rosuvastatin  10 mg Oral QHS  . tamsulosin  0.4 mg Oral Daily   Continuous Infusions: . sodium chloride 250 mL (03/08/19 1635)   PRN Meds:.atropine, polyethylene glycol  Antimicrobials: Anti-infectives (From admission, onward)   None       I have personally reviewed the following labs and images: CBC: Recent Labs  Lab 03/09/19 1030 03/10/19 0317 03/11/19 0401 03/12/19 0426 03/13/19 0454  WBC 7.8 7.8 5.4 3.6* 3.1*  HGB 8.7* 7.8* 7.9* 8.4* 9.9*  HCT 29.3* 25.7* 25.3* 27.3* 31.8*  MCV 89.6 87.1 86.3 86.4 87.1  PLT 127* 141* 147* 169 152   BMP &GFR Recent Labs  Lab 03/09/19 0344 03/09/19 2221 03/10/19 0317 03/11/19 0401 03/12/19 0426 03/13/19 0454  NA 137 137 138 140 141 139  K 4.8 4.8 4.6 3.9 4.6 4.7  CL 110 108 108 107 107 105  CO2 20* 19* 21* 23 26 23   GLUCOSE 213* 220* 171* 81 74 76  BUN 24* 29* 28* 28* 21 19  CREATININE 1.50* 1.76* 1.70* 1.41* 1.28* 1.15  CALCIUM 8.1* 8.2* 8.2* 8.4* 8.8* 8.9  MG  --  2.2 2.3 2.2 2.0 1.9  PHOS 3.6  --   --   --   --   --    Estimated Creatinine Clearance: 42.4 mL/min (by C-G formula based on SCr of 1.15 mg/dL). Liver & Pancreas: Recent Labs  Lab 03/08/19 1201 03/09/19 0344  AST 33  --   ALT 21  --   ALKPHOS 98  --   BILITOT 1.1  --   PROT 5.3*  --   ALBUMIN 2.7* 2.8*   No results for input(s): LIPASE, AMYLASE in the last 168 hours. No results for input(s): AMMONIA in the last 168 hours. Diabetic: No results for input(s): HGBA1C in the last 72 hours. Recent Labs  Lab 03/08/19 0956 03/08/19 1748 03/10/19 0225  GLUCAP 89 89 178*   Cardiac Enzymes: No results for input(s): CKTOTAL, CKMB, CKMBINDEX, TROPONINI in the last 168 hours. No results for  input(s): PROBNP in the last 8760 hours. Coagulation Profile: No results for input(s): INR, PROTIME in the last 168 hours. Thyroid Function Tests: No results for input(s): TSH, T4TOTAL, FREET4, T3FREE, THYROIDAB in the last 72 hours. Lipid Profile: No results for input(s): CHOL, HDL, LDLCALC, TRIG, CHOLHDL, LDLDIRECT in the last 72 hours. Anemia Panel: No results for input(s): VITAMINB12, FOLATE, FERRITIN, TIBC, IRON, RETICCTPCT in the last 72 hours. Urine analysis:    Component Value Date/Time   COLORURINE YELLOW 11/13/2017 1108   APPEARANCEUR CLEAR 11/13/2017 1108   LABSPEC 1.045 (H) 11/13/2017 1108   PHURINE 5.0 11/13/2017 1108   GLUCOSEU NEGATIVE 11/13/2017 1108   HGBUR MODERATE (A) 11/13/2017 1108   BILIRUBINUR NEGATIVE 11/13/2017 1108   BILIRUBINUR Negative 08/29/2016 1618   KETONESUR NEGATIVE 11/13/2017 1108   PROTEINUR NEGATIVE 11/13/2017 1108   UROBILINOGEN 0.2 08/29/2016 1618   NITRITE NEGATIVE 11/13/2017  [place] 11/13/2017 1108   Sepsis Labs: Invalid input(s): PROCALCITONIN, [place]  Microbiology: Recent Results (from the past 240 hour(s))  SARS CORONAVIRUS 2 (TAT 6-24 HRS) Nasopharyngeal Nasopharyngeal Swab     Status: None   Collection Time: 03/08/19 12:01 PM   Specimen: Nasopharyngeal Swab  Result Value Ref Range Status   SARS Coronavirus 2 NEGATIVE NEGATIVE Final    Comment: (NOTE) SARS-CoV-2 target nucleic acids are NOT DETECTED. The SARS-CoV-2 RNA is generally detectable in upper and lower respiratory specimens during the acute phase of infection. Negative results do not preclude SARS-CoV-2 infection, do not rule out co-infections with other pathogens, and should not be used as the sole basis for treatment or other patient management decisions. Negative results must be combined with clinical observations, patient history, and epidemiological information. The expected result is Negative. Fact Sheet for Patients:  SugarRoll.be Fact Sheet for Healthcare Providers: https://www.woods-mathews.com/ This test is not yet approved or cleared by the [place] FDA and  has been authorized for detection and/or diagnosis of SARS-CoV-2 by FDA under an Emergency Use Authorization (EUA). This EUA will remain  in effect (meaning this test can be used) for the duration of the COVID-19 declaration under Section 56 4(b)(1) of the Act, 21 U.S.C. section 360bbb-3(b)(1), unless the authorization is terminated or revoked sooner. Performed at [facility], [place] [address] [place], [place] [address]   MRSA PCR Screening     Status: None   Collection Time: 03/08/19  5:54 PM  Result Value Ref Range Status   MRSA by PCR NEGATIVE NEGATIVE Final    Comment:        The GeneXpert MRSA Assay (FDA approved for NASAL specimens only), is one component of a comprehensive MRSA colonization surveillance program. It is not intended to diagnose MRSA infection nor to guide or monitor treatment for MRSA infections. Performed at [facility], [place] [address] [place], [place] [address]     Radiology Studies: No results found.  35 minutes with more than 50% spent in reviewing records, counseling patient/family and coordinating care.  Mercadez He[address]man [address] [place]  If 7PM-7AM, please contact night-coverage [email or web address] Password [facility] 03/13/2019, 3:29 PM

## 2019-03-13 NOTE — Evaluation (Signed)
Physical Therapy Evaluation Patient Details Name: Ian Hill MRN: [id number] DOB: 04-15-1951 Today's Date: 03/13/2019   History of Present Illness  68 year old male patient who is in the outpatient clinic receiving Feraheme developed acute hypotension, nausea vomiting and altered mental status was admitted with working diagnosis of anaphylaxis vs hypotensive response to [facility] on 10/27. On 10/29, he had brief episode of asystole followed by heart rate in the 40s and patient was unresponsive.    he is with a history of hypertension, hypertrophic cardiomyopathy, CKD and iron deficiency anemia.  Clinical Impression   Pt admitted with above diagnosis. Ian Hill' prior level of function isn't entirely clear, but he tells me he did walk with a cane, and is able to bathe and dress himself; Presents with decr functional mobility, dependence on UE support on RW for balance with amb;  Pt currently with functional limitations due to the deficits listed below (see PT Problem List). Pt will benefit from skilled PT to increase their independence and safety with mobility to allow discharge to the venue listed below.    Noted he is a client of [facility]; I believe they can provide follow up PT/OT     Follow Up Recommendations Supervision/Assistance - 24 hour;Other (comment)(Follow up therapies provided by [facility])    Equipment Recommendations  Rolling walker with 5" wheels    Recommendations for Other Services       Precautions / Restrictions Precautions Precautions: Fall Precaution Comments: Fall risk present, but on the low side, and greatly reduced with use of assistive device      Mobility  Bed Mobility Overal bed mobility: Needs Assistance Bed Mobility: Supine to Sit     Supine to sit: Supervision     General bed mobility comments: Supervision for safety  Transfers Overall transfer level: Needs assistance Equipment used: Rolling walker (2 wheeled) Transfers: Sit  to/from Stand Sit to Stand: Min guard         General transfer comment: Ntoed good push up with UEs from seated surface without the need for cueing  Ambulation/Gait Ambulation/Gait assistance: Min guard Gait Distance (Feet): 200 Feet Assistive device: Rolling walker (2 wheeled) Gait Pattern/deviations: Step-through pattern     General Gait Details: He seemed pleased with using RW; no overt loss of balance with RW  Stairs            Wheelchair Mobility    Modified Rankin (Stroke Patients Only)       Balance Overall balance assessment: Needs assistance   Sitting balance-Leahy Scale: Good       Standing balance-Leahy Scale: Fair                               Pertinent Vitals/Pain Pain Assessment: No/denies pain    Home Living Family/patient expects to be discharged to:: Private residence Living Arrangements: Other relatives(Sister) Available Help at Discharge: Family;Available PRN/intermittently Type of Home: House Home Access: Stairs to enter Entrance Stairs-Rails: Psychiatric nurse of Steps: 3 Home Layout: One level Home Equipment: Cane - single point      Prior Function Level of Independence: Needs assistance   Gait / Transfers Assistance Needed: Uses cane at baseline  ADL's / Homemaking Assistance Needed: Performs own ADLs  Comments: Noted he is a client of [facility] -- I wonder if he attends Adult day Care     Hand Dominance        Extremity/Trunk  Assessment   Upper Extremity Assessment Upper Extremity Assessment: Defer to OT evaluation    Lower Extremity Assessment Lower Extremity Assessment: Generalized weakness       Communication   Communication: No difficulties;Other (comment)(at times he talks fast and is difficult to understand)  Cognition Arousal/Alertness: Awake/alert Behavior During Therapy: WFL for tasks assessed/performed Overall Cognitive Status: No family/caregiver present to  determine baseline cognitive functioning                                 General Comments: [name] is at his cognitive baseline      General Comments General comments (skin integrity, edema, etc.): HR ranged 120-125 with walking the hallways    Exercises     Assessment/Plan    PT Assessment Patient needs continued PT services  PT Problem List Decreased strength;Decreased activity tolerance;Decreased balance;Decreased mobility;Decreased coordination;Decreased cognition;Decreased knowledge of use of DME;Decreased safety awareness;Decreased knowledge of precautions       PT Treatment Interventions DME instruction;Gait training;Stair training;Functional mobility training;Therapeutic activities;Therapeutic exercise;Balance training;Neuromuscular re-education;Cognitive remediation;Patient/family education    PT Goals (Current goals can be found in the Care Plan section)  Acute Rehab PT Goals Patient Stated Goal: Hopes to go home tomorrow PT Goal Formulation: With patient Time For Goal Achievement: 03/27/19 Potential to Achieve Goals: Good    Frequency Min 3X/week   Barriers to discharge        Co-evaluation               AM-PAC PT "6 Clicks" Mobility  Outcome Measure Help needed turning from your back to your side while in a flat bed without using bedrails?: None Help needed moving from lying on your back to sitting on the side of a flat bed without using bedrails?: None Help needed moving to and from a bed to a chair (including a wheelchair)?: A Little Help needed standing up from a chair using your arms (e.g., wheelchair or bedside chair)?: A Little Help needed to walk in hospital room?: A Little Help needed climbing 3-5 steps with a railing? : A Little 6 Click Score: 20    End of Session Equipment Utilized During Treatment: Gait belt Activity Tolerance: Patient tolerated treatment well Patient left: in chair;with call bell/phone within reach;with  chair alarm set Nurse Communication: Mobility status PT Visit Diagnosis: Unsteadiness on feet (R26.81)    Time: 1430-1501 PT Time Calculation (min) (ACUTE ONLY): 31 min   Charges:   PT Evaluation $PT Eval Moderate Complexity: 1 Mod PT Treatments $Gait Training: 8-22 mins        Ian Hill, PT  Acute Rehabilitation Services Pager [phone] Office [phone]   [place] 03/13/2019, 4:28 PM

## 2019-03-14 ENCOUNTER — Other Ambulatory Visit: Payer: Self-pay | Admitting: Student

## 2019-03-14 DIAGNOSIS — I480 Paroxysmal atrial fibrillation: Secondary | ICD-10-CM

## 2019-03-14 LAB — CBC
HCT: 32.4 % — ABNORMAL LOW (ref 39.0–52.0)
Hemoglobin: 9.9 g/dL — ABNORMAL LOW (ref 13.0–17.0)
MCH: 26.8 pg (ref 26.0–34.0)
MCHC: 30.6 g/dL (ref 30.0–36.0)
MCV: 87.8 fL (ref 80.0–100.0)
Platelets: 159 10*3/uL (ref 150–400)
RBC: 3.69 MIL/uL — ABNORMAL LOW (ref 4.22–5.81)
RDW: 27.5 % — ABNORMAL HIGH (ref 11.5–15.5)
WBC: 2.2 10*3/uL — ABNORMAL LOW (ref 4.0–10.5)
nRBC: 2.3 % — ABNORMAL HIGH (ref 0.0–0.2)

## 2019-03-14 LAB — BASIC METABOLIC PANEL
Anion gap: 6 (ref 5–15)
BUN: 21 mg/dL (ref 8–23)
CO2: 28 mmol/L (ref 22–32)
Calcium: 8.9 mg/dL (ref 8.9–10.3)
Chloride: 106 mmol/L (ref 98–111)
Creatinine, Ser: 1.3 mg/dL — ABNORMAL HIGH (ref 0.61–1.24)
GFR calc Af Amer: >60 mL/min (ref >60)
GFR calc non Af Amer: 56 mL/min — ABNORMAL LOW (ref >60)
Glucose, Bld: 71 mg/dL (ref 70–99)
Potassium: 4.8 mmol/L (ref 3.5–5.1)
Sodium: 140 mmol/L (ref 135–145)

## 2019-03-14 LAB — MAGNESIUM: Magnesium: 2 mg/dL (ref 1.7–2.4)

## 2019-03-14 MED ORDER — DIGOXIN 125 MCG PO TABS
0.1250 mg | ORAL_TABLET | Freq: Every day | ORAL | Status: DC
  Administered 2019-03-14: 0.125 mg via ORAL
  Filled 2019-03-14: qty 1

## 2019-03-14 MED ORDER — DIGOXIN 125 MCG PO TABS: 0.1250 mg | ORAL_TABLET | Freq: Every day | ORAL | 0 refills | Status: DC

## 2019-03-14 NOTE — Clinical Social Work Note (Signed)
Rolling walker ordered through [employer]. [employer] will deliver it at bedside.  [place], [place]

## 2019-03-14 NOTE — Accreditation Note (Addendum)
CSW spoke with pt's Optometrist and she will have transport ready for pt when d/c. CSW will let social worker know when dc is in.   12:34- transport has been set up through [employer].  [place], [place]

## 2019-03-14 NOTE — Progress Notes (Signed)
Pt HR in 120's-130's afib. EP Barrington Ellison, PA notified and stated he would look at his medications.

## 2019-03-14 NOTE — Discharge Summary (Signed)
Physician Discharge Summary  CALDWELL TRIANO [id number] DOB: 14-Feb-1951 DOA: 03/08/2019  PCP: Linda Hedges, NP  Admit date: 03/08/2019 Discharge date: 03/14/2019  Admitted From: Infusion center Discharged to: [facility] Equipment/Devices: Rolling walker Discharge Condition: Stable  Recommendations for Outpatient Follow-up   1. Follow up with PCP in 1-2 weeks 2. Follow-up with cardiology  Medication Adjustments at Discharge  1. Atenolol discontinued.  Hold all AV nodal blocking agents 2. Digoxin added, needs to be reevaluated in 7-10 days   Hospital Summary  68 year old male patient with a history of hypertension, HOCM, CKD and iron deficiency anemia. He was at [facility] on 10/27 for second dose of Feraheme. After receiving almost his entire dose on 10/27 he developed acute nausea, vomiting, decreased mental status and systolic blood pressure down into the 40s. He was immediately administered fluid volume challenge, placed in Trendelenburg position, given Solu-Medrol supplemental oxygen and transferred to the emergency room with a working diagnosis of anaphylaxis. He received 2 doses of intramuscular epinephrine with improved mental status and overall blood pressure however he continued to remain hypotensive intermittently and because of this critical care was asked to admit.  On the evening of 03/09/2019 the patient had a 12 beat run of VT followed by a loss of responsiveness and asystole followed by HR in the 40's.  H The patient was combative and agitated upon awakening to stimulation. He required zyprexa due to violent behavior towards staff. It is felt that this was due to acute hypotension due to feraheme infusion. This is not considered true anaphylaxis.  Echocardiogram was obtained and demonstrated EF of 60-65% with severe LVH and severe hypertrophy particularly in the apex and septum. EP cardiology has been consulted. he was noted to have complete heart block  overnight 10/29 followed by atrial flutter with multiple long pauses 8 to 9 seconds. They have discussed possible PPM placement with the patient and his family. Family is not in favor of pacemaker due to the patient's multiple comorbidities and the complexity of managing this device for this gentleman with cognitive.  Atenolol was held and the patient had improvement in heart rate and symptoms.   Evaluated by physical therapy.  Noted to be a client of [facility] and was discharged with a rolling walker with plan for outpatient follow-up.  11/2: No further pauses over the weekend and a follow-up with cardiology was made.  Rates variable and okay to discharge from cardiology perspective.  Digoxin added, needs to be reevaluated in 7-10 days.  A & P   Active Problems:   Anaphylaxis     Code Status: Full Code Diet recommendation: Heart healthy  Consultants  . Cardiology/EP cardiology . PCCM   Subjective  Patient seen and examined at bedside no acute distress and resting comfortably.  No events overnight.  Tolerating diet. In good spirits and anticipating discharge.   Denies any chest pain, shortness of breath, fever, nausea, vomiting, urinary or bowel complaints. Otherwise ROS negative   Objective   Discharge Exam: Vitals:   03/14/19 0920 03/14/19 1058  BP:    Pulse:  (!) 122  Resp:    Temp:    SpO2: 98%    Vitals:   03/14/19 0520 03/14/19 0857 03/14/19 0920 03/14/19 1058  BP: 111/72 (!) 135/119    Pulse: 90   (!) 122  Resp: 15     Temp: 97.9 F (36.6 C)     TempSrc: Oral     SpO2: 100% 98% 98%  Weight: 48.1 kg     Height:        Physical Exam Vitals signs and nursing note reviewed.  Constitutional:      Appearance: Normal appearance.  HENT:     Head: Normocephalic and atraumatic.     Nose: Nose normal.     Mouth/Throat:     Mouth: Mucous membranes are moist.  Eyes:     Extraocular Movements: Extraocular movements intact.  Neck:     Musculoskeletal:  Normal range of motion. No neck rigidity.  Cardiovascular:     Rate and Rhythm: Regular rhythm. Tachycardia present.  Pulmonary:     Effort: Pulmonary effort is normal.     Breath sounds: Normal breath sounds.  Abdominal:     General: Abdomen is flat.     Palpations: Abdomen is soft.  Musculoskeletal: Normal range of motion.        General: No swelling.  Neurological:     Mental Status: [name] alert. Mental status is at baseline.  Psychiatric:        Mood and Affect: Mood normal.        Behavior: Behavior normal.       The results of significant diagnostics from this hospitalization (including imaging, microbiology, ancillary and laboratory) are listed below for reference.     Microbiology: Recent Results (from the past 240 hour(s))  SARS CORONAVIRUS 2 (TAT 6-24 HRS) Nasopharyngeal Nasopharyngeal Swab     Status: None   Collection Time: 03/08/19 12:01 PM   Specimen: Nasopharyngeal Swab  Result Value Ref Range Status   SARS Coronavirus 2 NEGATIVE NEGATIVE Final    Comment: (NOTE) SARS-CoV-2 target nucleic acids are NOT DETECTED. The SARS-CoV-2 RNA is generally detectable in upper and lower respiratory specimens during the acute phase of infection. Negative results do not preclude SARS-CoV-2 infection, do not rule out co-infections with other pathogens, and should not be used as the sole basis for treatment or other patient management decisions. Negative results must be combined with clinical observations, patient history, and epidemiological information. The expected result is Negative. Fact Sheet for Patients: SugarRoll.be Fact Sheet for Healthcare Providers: https://www.woods-mathews.com/ This test is not yet approved or cleared by the [place] FDA and  has been authorized for detection and/or diagnosis of SARS-CoV-2 by FDA under an Emergency Use Authorization (EUA). This EUA will remain  in effect (meaning this test can be  used) for the duration of the COVID-19 declaration under Section 56 4(b)(1) of the Act, 21 U.S.C. section 360bbb-3(b)(1), unless the authorization is terminated or revoked sooner. Performed at [facility], [place] [address] [place], [place] [address]   MRSA PCR Screening     Status: None   Collection Time: 03/08/19  5:54 PM  Result Value Ref Range Status   MRSA by PCR NEGATIVE NEGATIVE Final    Comment:        The GeneXpert MRSA Assay (FDA approved for NASAL specimens only), is one component of a comprehensive MRSA colonization surveillance program. It is not intended to diagnose MRSA infection nor to guide or monitor treatment for MRSA infections. Performed at [facility], [place] [address], [place], [place] [address]      Labs: BNP (last 3 results) No results for input(s): BNP in the last 8760 hours. Basic Metabolic Panel: Recent Labs  Lab 03/09/19 0344  03/10/19 0317 03/11/19 0401 03/12/19 0426 03/13/19 0454 03/14/19 0516  NA 137   < > 138 140 141 139 140  K 4.8   < >  4.6 3.9 4.6 4.7 4.8  CL 110   < > 108 107 107 105 106  CO2 20*   < > 21* 23 26 23 28   GLUCOSE 213*   < > 171* 81 74 76 71  BUN 24*   < > 28* 28* 21 19 21   CREATININE 1.50*   < > 1.70* 1.41* 1.28* 1.15 1.30*  CALCIUM 8.1*   < > 8.2* 8.4* 8.8* 8.9 8.9  MG  --    < > 2.3 2.2 2.0 1.9 2.0  PHOS 3.6  --   --   --   --   --   --    < > = values in this interval not displayed.   Liver Function Tests: Recent Labs  Lab 03/08/19 1201 03/09/19 0344  AST 33  --   ALT 21  --   ALKPHOS 98  --   BILITOT 1.1  --   PROT 5.3*  --   ALBUMIN 2.7* 2.8*   No results for input(s): LIPASE, AMYLASE in the last 168 hours. No results for input(s): AMMONIA in the last 168 hours. CBC: Recent Labs  Lab 03/10/19 0317 03/11/19 0401 03/12/19 0426 03/13/19 0454 03/14/19 0516  WBC 7.8 5.4 3.6* 3.1* 2.2*  HGB 7.8* 7.9* 8.4* 9.9* 9.9*  HCT 25.7* 25.3* 27.3* 31.8* 32.4*  MCV 87.1 86.3 86.4 87.1 87.8  PLT  141* 147* 169 152 159   Cardiac Enzymes: No results for input(s): CKTOTAL, CKMB, CKMBINDEX, TROPONINI in the last 168 hours. BNP: Invalid input(s): POCBNP CBG: Recent Labs  Lab 03/08/19 0956 03/08/19 1748 03/10/19 0225  GLUCAP 89 89 178*   D-Dimer No results for input(s): DDIMER in the last 72 hours. Hgb A1c No results for input(s): HGBA1C in the last 72 hours. Lipid Profile No results for input(s): CHOL, HDL, LDLCALC, TRIG, CHOLHDL, LDLDIRECT in the last 72 hours. Thyroid function studies No results for input(s): TSH, T4TOTAL, T3FREE, THYROIDAB in the last 72 hours.  Invalid input(s): FREET3 Anemia work up No results for input(s): VITAMINB12, FOLATE, FERRITIN, TIBC, IRON, RETICCTPCT in the last 72 hours. Urinalysis    Component Value Date/Time   COLORURINE YELLOW 11/13/2017 1108   APPEARANCEUR CLEAR 11/13/2017 1108   LABSPEC 1.045 (H) 11/13/2017 1108   PHURINE 5.0 11/13/2017 1108   GLUCOSEU NEGATIVE 11/13/2017 1108   HGBUR MODERATE (A) 11/13/2017 1108   BILIRUBINUR NEGATIVE 11/13/2017 1108   BILIRUBINUR Negative 08/29/2016 1618   KETONESUR NEGATIVE 11/13/2017 1108   PROTEINUR NEGATIVE 11/13/2017 1108   UROBILINOGEN 0.2 08/29/2016 1618   NITRITE NEGATIVE 11/13/2017 1108   LEUKOCYTESUR NEGATIVE 11/13/2017 1108   Sepsis Labs Invalid input(s): PROCALCITONIN,  WBC,  LACTICIDVEN Microbiology Recent Results (from the past 240 hour(s))  SARS CORONAVIRUS 2 (TAT 6-24 HRS) Nasopharyngeal Nasopharyngeal Swab     Status: None   Collection Time: 03/08/19 12:01 PM   Specimen: Nasopharyngeal Swab  Result Value Ref Range Status   SARS Coronavirus 2 NEGATIVE NEGATIVE Final    Comment: (NOTE) SARS-CoV-2 target nucleic acids are NOT DETECTED. The SARS-CoV-2 RNA is generally detectable in upper and lower respiratory specimens during the acute phase of infection. Negative results do not preclude SARS-CoV-2 infection, do not rule out co-infections with other pathogens, and should  not be used as the sole basis for treatment or other patient management decisions. Negative results must be combined with clinical observations, patient history, and epidemiological information. The expected result is Negative. Fact Sheet for Patients: SugarRoll.be Fact Sheet for Healthcare Providers: https://www.woods-mathews.com/  This test is not yet approved or cleared by the [place] and  has been authorized for detection and/or diagnosis of SARS-CoV-2 by FDA under an Emergency Use Authorization (EUA). This EUA will remain  in effect (meaning this test can be used) for the duration of the COVID-19 declaration under Section 56 4(b)(1) of the Act, 21 U.S.C. section 360bbb-3(b)(1), unless the authorization is terminated or revoked sooner. Performed at [facility], [place] [address] [place], [place] [address]   MRSA PCR Screening     Status: None   Collection Time: 03/08/19  5:54 PM  Result Value Ref Range Status   MRSA by PCR NEGATIVE NEGATIVE Final    Comment:        The GeneXpert MRSA Assay (FDA approved for NASAL specimens only), is one component of a comprehensive MRSA colonization surveillance program. It is not intended to diagnose MRSA infection nor to guide or monitor treatment for MRSA infections. Performed at [facility], [place] [address] [place],  [address]     Discharge Instructions     Discharge Instructions    Diet - low sodium heart healthy   Complete by: As directed    Discharge instructions   Complete by: As directed    You were seen in the hospital after you had an adverse reaction to IV iron and subsequently had low blood pressure.  You were in the ICU for a brief stay and had improvement of your symptoms prior to discharge.  Upon discharge: -Stop taking atenolol -Continue taking the remaining medications as prescribed -Follow-up with your cardiologist -Follow-up with your  PCP -Follow up with [facility] for physical therapy  If you have any significant change or worsening of your symptoms Please contact your PCP or return to the ED   Increase activity slowly   Complete by: As directed      Allergies as of 03/14/2019      Reactions   Feraheme [ferumoxytol] Anaphylaxis      Medication List    STOP taking these medications   atenolol 50 MG tablet Commonly known as: TENORMIN     TAKE these medications   apixaban 5 MG Tabs tablet Commonly known as: ELIQUIS Take 1 tablet (5 mg total) by mouth 2 (two) times daily.   AYR SALINE NASAL NA Place 2 sprays into the nose 4 (four) times daily as needed.   digoxin 0.125 MG tablet Commonly known as: LANOXIN Take 1 tablet (0.125 mg total) by mouth daily for 10 days. Start taking on: March 15, 2019   feeding supplement (ENSURE ENLIVE) Liqd Take 237 mLs by mouth 3 (three) times daily.   finasteride 5 MG tablet Commonly known as: PROSCAR Take 1 tablet (5 mg total) by mouth daily.   nicotine polacrilex 2 MG gum Commonly known as: NICORETTE Take 2 mg by mouth as needed for smoking cessation.   phenazopyridine 200 MG tablet Commonly known as: PYRIDIUM Take 200 mg by mouth 2 (two) times daily as needed for pain.   polyethylene glycol 17 g packet Commonly known as: MIRALAX / GLYCOLAX Take 17 g by mouth daily as needed.   rosuvastatin 10 MG tablet Commonly known as: CRESTOR Take 10 mg by mouth at bedtime.   tamsulosin 0.4 MG Caps capsule Commonly known as: FLOMAX Take 1 capsule (0.4 mg total) by mouth daily.   TUMS GAS RELIEF CHEWY BITES PO Take 1 tablet by mouth daily as needed (for gas).  Vitamin D 50 MCG (2000 UT) Caps Take 2,000 Units by mouth daily.            Durable Medical Equipment  (From admission, onward)         Start     Ordered   03/14/19 0841  For home use only DME Walker rolling  Once    Question:  Patient needs a walker to treat with the following condition   Answer:  Ambulatory dysfunction   03/14/19 0840         Follow-up Information    Shirley Friar, PA-C Follow up on 04/18/2019.   Specialty: Physician Assistant Why: at 1210 for post hospital follow up.  Contact information: [address] [place] [address] [phone]        [facility] Follow up on 03/23/2019.   Why: at 1130 for post hospital labs. BMET and digoxin level.  Contact information: [place] [phone]         Allergies  Allergen Reactions  . Feraheme [Ferumoxytol] Anaphylaxis    Time coordinating discharge: Over 30 minutes   SIGNED:   Harold Hedge, D.O. [facility] 03/14/2019, 12:22 PM

## 2019-03-14 NOTE — Evaluation (Signed)
Occupational Therapy Evaluation Patient Details Name: Ian Hill MRN: [id number] DOB: 11/08/1950 Today's Date: 03/14/2019    History of Present Illness 68 year old male patient who is in the outpatient clinic receiving Feraheme developed acute hypotension, nausea vomiting and altered mental status was admitted with working diagnosis of anaphylaxis vs hypotensive response to [place] on 10/27. On 10/29, he had brief episode of asystole followed by heart rate in the 40s and patient was unresponsive.    he is with a history of hypertension, hypertrophic cardiomyopathy, CKD and iron deficiency anemia.   Clinical Impression   Patient presenting with decreased I in self care, functional mobility/balance, endurance, and safety awareness. Patient reports being I with self care tasks PTA. Patient currently functioning min guard - min A. Patient will benefit from acute OT to increase overall independence in the areas of ADLs, functional mobility, and safety awareness in order to safely discharge to next venue of care.    Follow Up Recommendations  Home health OT;Supervision/Assistance - 24 hour([facility] program)    Equipment Recommendations  None recommended by OT       Precautions / Restrictions Precautions Precautions: Fall Precaution Comments: Fall risk present, but on the low side, and greatly reduced with use of assistive device Restrictions Weight Bearing Restrictions: No      Mobility Bed Mobility    General bed mobility comments: sitting in recliner chair  Transfers Overall transfer level: Needs assistance Equipment used: Rolling walker (2 wheeled) Transfers: Sit to/from Stand Sit to Stand: Min guard         General transfer comment: Ntoed good push up with UEs from seated surface without the need for cueing    Balance Overall balance assessment: Needs assistance   Sitting balance-Leahy Scale: Good       Standing balance-Leahy Scale: Fair          ADL either  performed or assessed with clinical judgement   ADL Overall ADL's : Needs assistance/impaired Eating/Feeding: Set up   Grooming: Wash/dry face;Oral care;Applying deodorant;Sitting;Set up;Wash/dry hands   Upper Body Bathing: Set up;Sitting   Lower Body Bathing: Minimal assistance;Sit to/from stand   Upper Body Dressing : Set up;Sitting   Lower Body Dressing: Minimal assistance   Toilet Transfer: Minimal assistance         Vision Baseline Vision/History: No visual deficits              Pertinent Vitals/Pain Pain Assessment: No/denies pain     Hand Dominance Right   Extremity/Trunk Assessment Upper Extremity Assessment Upper Extremity Assessment: Generalized weakness   Lower Extremity Assessment Lower Extremity Assessment: Generalized weakness       Communication Communication Communication: No difficulties;Other (comment)   Cognition Arousal/Alertness: Awake/alert Behavior During Therapy: WFL for tasks assessed/performed Overall Cognitive Status: No family/caregiver present to determine baseline cognitive functioning        General Comments: [name] is at his cognitive baseline              Home Living Family/patient expects to be discharged to:: Private residence Living Arrangements: Other relatives(sister) Available Help at Discharge: Family;Available PRN/intermittently Type of Home: House Home Access: Stairs to enter [employer] of Steps: 3 Entrance Stairs-Rails: Right;Left Home Layout: One level     Bathroom Shower/Tub: Tub/shower unit         Home Equipment: Cane - single point          Prior Functioning/Environment Level of Independence: Needs assistance  Gait / Transfers Assistance Needed: Uses cane at  baseline ADL's / Homemaking Assistance Needed: Performs own ADLs   Comments: Noted he is a client of [facility] -- I wonder if he attends Adult day Care        OT Problem List: Decreased strength;Decreased  coordination;Decreased activity tolerance;Impaired balance (sitting and/or standing);Decreased safety awareness      OT Treatment/Interventions: Self-care/ADL training;Therapeutic exercise;Patient/family education;Neuromuscular education;Balance training;Energy conservation;Therapeutic activities;DME and/or AE instruction    OT Goals(Current goals can be found in the care plan section) Acute Rehab OT Goals Patient Stated Goal: Hopes to go home tomorrow OT Goal Formulation: With patient Time For Goal Achievement: 03/28/19 Potential to Achieve Goals: Good ADL Goals Pt Will Perform Lower Body Dressing: with supervision Pt Will Transfer to Toilet: with supervision Pt Will Perform Toileting - Clothing Manipulation and hygiene: with supervision Pt Will Perform Tub/Shower Transfer: with supervision  OT Frequency: Min 2X/week   Barriers to D/C: (none known at this time)             AM-PAC OT "6 Clicks" Daily Activity     Outcome Measure Help from another person eating meals?: None Help from another person taking care of personal grooming?: None Help from another person toileting, which includes using toliet, bedpan, or urinal?: A Little Help from another person bathing (including washing, rinsing, drying)?: A Little Help from another person to put on and taking off regular upper body clothing?: None Help from another person to put on and taking off regular lower body clothing?: A Little 6 Click Score: 21   End of Session Equipment Utilized During Treatment: Rolling walker Nurse Communication: Mobility status  Activity Tolerance: Patient tolerated treatment well Patient left: in chair  OT Visit Diagnosis: Muscle weakness (generalized) (M62.81);Unsteadiness on feet (R26.81)                Time: [id number] OT Time Calculation (min): 13 min Charges:  OT General Charges $OT Visit: 1 Visit OT Evaluation $OT Eval Low Complexity: 1 Low  Ian Caesar P MS, OTR/L 03/14/2019, 1:33 PM

## 2019-03-14 NOTE — Progress Notes (Signed)
RN attempted to call pt's sister [name] twice. No answer.

## 2019-03-14 NOTE — Progress Notes (Addendum)
   No further pause over weekend. Follow up made.   Afib rates variable over weekend. Continue to follow.    OK for d/c with follow up from a cardiac perspective.   Will add digoxin 0.125 mg daily for rate control.  Will need digoxin level check in 7-10 days. Scheduled and added to AVS.  Electrophysiology team to see as needed while here. Please call with questions.   Legrand Como [address]" [place], PA-C  03/14/2019 7:16 AM

## 2019-03-14 NOTE — Progress Notes (Signed)
RN spoke with pt's sister Ian Hill and updated her on pt's discharge status. Ian Hill stated she understands plan of care and they will be home with pt arrives.

## 2019-03-14 NOTE — Progress Notes (Signed)
Discharge instructions given to Mittie, pt's sister. and states she understands medication changes.

## 2019-03-15 MED FILL — Epinephrine PF Inj 1 MG/ML: INTRAMUSCULAR | Qty: 1 | Status: AC

## 2019-03-22 ENCOUNTER — Telehealth: Payer: Self-pay | Admitting: Cardiology

## 2019-03-22 NOTE — Telephone Encounter (Signed)
Spoke with carrie, Aware of dr Jacalyn Lefevre recommendations.

## 2019-03-22 NOTE — Telephone Encounter (Signed)
This should go to the patient's primary cardiologist- dr Stanford Breed.  Kerin Ransom PA-C 03/22/2019 1:39 PM

## 2019-03-22 NOTE — Telephone Encounter (Signed)
Hold apixaban for 4 days and then resume; fu urology for hematuria Kirk Ruths

## 2019-03-22 NOTE — Telephone Encounter (Signed)
New Message      Ian Hill is calling from [facility] to speak with Kerin Ransom  She says Mr Fulmer has been in the Hospital, and has had blood in the urine  She is wondering about stopping the Eliquis     Please call

## 2019-03-23 ENCOUNTER — Other Ambulatory Visit: Payer: Medicare (Managed Care)

## 2019-03-24 ENCOUNTER — Other Ambulatory Visit: Payer: Medicare (Managed Care)

## 2019-04-08 ENCOUNTER — Encounter (HOSPITAL_COMMUNITY): Payer: Medicare (Managed Care)

## 2019-04-18 ENCOUNTER — Ambulatory Visit (INDEPENDENT_AMBULATORY_CARE_PROVIDER_SITE_OTHER): Payer: Medicare (Managed Care) | Admitting: Student

## 2019-04-18 ENCOUNTER — Other Ambulatory Visit: Payer: Self-pay

## 2019-04-18 ENCOUNTER — Encounter: Payer: Self-pay | Admitting: Student

## 2019-04-18 VITALS — BP 138/76 | HR 97 | Ht 72.0 in | Wt 120.0 lb

## 2019-04-18 DIAGNOSIS — I10 Essential (primary) hypertension: Secondary | ICD-10-CM | POA: Diagnosis not present

## 2019-04-18 DIAGNOSIS — I442 Atrioventricular block, complete: Secondary | ICD-10-CM | POA: Diagnosis not present

## 2019-04-18 DIAGNOSIS — I480 Paroxysmal atrial fibrillation: Secondary | ICD-10-CM

## 2019-04-18 DIAGNOSIS — I517 Cardiomegaly: Secondary | ICD-10-CM

## 2019-04-18 NOTE — Progress Notes (Signed)
PCP:  Linda Hedges, NP Primary Cardiologist: Kirk Ruths, MD Electrophysiologist: Dr. Pia Mau is a 68 y.o. male with past medical history of paroxysmal atrial fib/aflutter, NSVT, h/o apical variant HCM, CVA, prostate CA, anemia, and cognitive deficits at baseline who presents today for post hospital follow up. They are seen for Dr. Rayann Heman.   Since last being seen in our clinic, the patient reports doing very well.  He denies any dizziness, lightheadedness, syncope, near syncope, chest pain, or tachypalpitations. This was confirmed with his sister over the phone.   The patient feels that he is tolerating medications without difficulties and is otherwise without complaint today.   Past Medical History:  Diagnosis Date  . Anticoagulant long-term use    eliquis  . Bladder tumor   . BPH (benign prostatic hyperplasia)   . CKD (chronic kidney disease), stage II   . Diverticulosis   . Dyslipidemia   . History of CVA with residual deficit 01/28/2017   acute right MCA infarc with M2 embolic occlusion w/p  tPA and mechnical thrombectomy---  residual left side weakness  . History of external beam radiation therapy    03-23-2018  to 05-03-2018  prostate cancer  . Hypertension   . Hypertensive heart disease   . Hypertrophic cardiomegaly, apical variant   . Hypertrophic obstructive cardiomyopathy with diastolic heart failure [facility])    cardiologist-- dr Stanford Breed  . Mitral regurgitation   . Other problems related to education and literacy    can not read  . Prostate cancer [facility]) urologist-  dr dahlstedt/  oncologsit-- dr Tammi Klippel   dx 07/ 2018---  Stage T1a,  Gleason 4+3--- completed external radiation therapy 05-03-2018   Past Surgical History:  Procedure Laterality Date  . 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 CARDIOVASCULAR STRESS TEST  11-19-2016  dr Stanford Breed   Low risk nuclear study/  normal resting and stress perfusion  with no ischemia or infarction/  EF not done due to PVCIs , TID borderline, 1.2 significant LVH  . GOLD SEED IMPLANT N/A 03/10/2018   Procedure: GOLD SEED IMPLANT;  Surgeon: Franchot Gallo, MD;  Location: [facility];  Service: Urology;  Laterality: N/A;  . IR PERCUTANEOUS ART THROMBECTOMY/INFUSION INTRACRANIAL INC DIAG ANGIO  01/23/2017  . RADIOLOGY WITH ANESTHESIA N/A 01/23/2017   Procedure: RADIOLOGY WITH ANESTHESIA;  Surgeon: Luanne Bras, MD;  Location: [place];  Service: Radiology;  Laterality: N/A;  . SPACE OAR INSTILLATION N/A 03/10/2018   Procedure: SPACE OAR INSTILLATION;  Surgeon: Franchot Gallo, MD;  Location: [facility];  Service: Urology;  Laterality: N/A;  . TRANSTHORACIC ECHOCARDIOGRAM  11-20-2016   dr Stanford Breed   mild LVH,  apical hypertrophic cardiomyopathy, systolic function vigorous,  ef 65-70%,  grade 2 diastolic dysfunction/  mild AR, MR, and  PR/  moderate LAE and RAE/  mild to mod. TR/  moderate increase PASP 41mmHg  . TRANSURETHRAL RESECTION OF BLADDER TUMOR WITH MITOMYCIN-C N/A 12/09/2018   Procedure: TRANSURETHRAL RESECTION OF BLADDER TUMOR WITH GEMCITABINE GIVEN IN PACU;  Surgeon: Franchot Gallo, MD;  Location: [facility];  Service: Urology;  Laterality: N/A;  . TRANSURETHRAL RESECTION OF PROSTATE N/A 11/24/2016   Procedure: TRANSURETHRAL RESECTION OF THE PROSTATE (TURP);  Surgeon: Franchot Gallo, MD;  Location: [facility];  Service: Urology;  Laterality: N/A;    Current Outpatient Medications  Medication Sig Dispense Refill  . apixaban (ELIQUIS)  5 MG TABS tablet Take 1 tablet (5 mg total) by mouth 2 (two) times daily. 60 tablet 0  . AYR SALINE NASAL NA Place 2 sprays into the nose 4 (four) times daily as needed.    . Calcium Carbonate-Simethicone (TUMS GAS RELIEF CHEWY BITES PO) Take 1 tablet by mouth daily as needed (for gas).     . Cholecalciferol (VITAMIN D) 2000 units CAPS Take 2,000  Units by mouth daily.    . feeding supplement, ENSURE ENLIVE, (ENSURE ENLIVE) LIQD Take 237 mLs by mouth 3 (three) times daily. 237 mL 12  . finasteride (PROSCAR) 5 MG tablet Take 1 tablet (5 mg total) by mouth daily. 30 tablet 0  . nicotine polacrilex (NICORETTE) 2 MG gum Take 2 mg by mouth as needed for smoking cessation.    . phenazopyridine (PYRIDIUM) 200 MG tablet Take 200 mg by mouth 2 (two) times daily as needed for pain.    . polyethylene glycol (MIRALAX / GLYCOLAX) packet Take 17 g by mouth daily as needed. 14 each 0  . rosuvastatin (CRESTOR) 10 MG tablet Take 10 mg by mouth at bedtime.     . tamsulosin (FLOMAX) 0.4 MG CAPS capsule Take 1 capsule (0.4 mg total) by mouth daily. 30 capsule 0   No current facility-administered medications for this visit.     Allergies  Allergen Reactions  . Feraheme [Ferumoxytol] Anaphylaxis    Social History   Socioeconomic History  . Marital status: Single    Spouse name: Not on file  . Number of children: Not on file  . Years of education: Not on file  . Highest education level: Not on file  Occupational History    Comment: disabled  Social Needs  . Financial resource strain: Not on file  . Food insecurity    Worry: Not on file    Inability: Not on file  . Transportation needs    Medical: Not on file    Non-medical: Not on file  Tobacco Use  . Smoking status: Current Some Day Smoker    Packs/day: 0.50    Years: 51.00    Pack years: 25.50    Types: Cigarettes  . Smokeless tobacco: Never Used  Substance and Sexual Activity  . Alcohol use: No    Frequency: Never  . Drug use: No  . Sexual activity: Not Currently  Lifestyle  . Physical activity    Days per week: Patient refused    Minutes per session: Patient refused  . Stress: Not on file  Relationships  . Social connections    Talks on phone: More than three times a week    Gets together: More than three times a week    Attends religious service: Patient refused     Active member of club or organization: No    Attends meetings of clubs or organizations: Never    Relationship status: Never married  . Intimate partner violence    Fear of current or ex partner: No    Emotionally abused: No    Physically abused: No    Forced sexual activity: No  Other Topics Concern  . Not on file  Social History Narrative   ** Merged History Encounter **      Resides with sister, Osvaldo Angst       Review of Systems: General: No chills, fever, night sweats or weight changes  Cardiovascular:  No chest pain, dyspnea on exertion, edema, orthopnea, palpitations, paroxysmal nocturnal dyspnea Dermatological: No rash, lesions or masses  Respiratory: No cough, dyspnea Urologic: No hematuria, dysuria Abdominal: No nausea, vomiting, diarrhea, bright red blood per rectum, melena, or hematemesis Neurologic: No visual changes, weakness, changes in mental status All other systems reviewed and are otherwise negative except as noted above.  Physical Exam: Vitals:   04/18/19 1211  BP: 138/76  Pulse: 97  SpO2: 98%  Weight: 120 lb (54.4 kg)  Height: 6' (1.829 m)    GEN- Appears older than stated age. alert and oriented x 3 today.   HEENT: normocephalic, atraumatic; sclera clear, conjunctiva pink; hearing intact; oropharynx clear; neck supple, no JVP Lymph- no cervical lymphadenopathy Lungs- Clear to ausculation bilaterally, normal work of breathing.  No wheezes, rales, rhonchi Heart- Regular rate and rhythm, no murmurs, rubs or gallops, PMI not laterally displaced GI- soft, non-tender, non-distended, bowel sounds present, no hepatosplenomegaly Extremities- no clubbing, cyanosis, or edema; DP/PT/radial pulses 2+ bilaterally MS- no significant deformity or atrophy Skin- warm and dry, no rash or lesion Psych- euthymic mood, full affect Neuro- strength and sensation are intact  EKG is ordered. Personal review of EKG from today shows NSR at 96 bpm with QRS 88 ms, and 1st  degree AV block with PR interval 222 ms  Assessment and Plan:  1. Intermittent CHB This occurred while in the hospital for suspected shock s/p dose of feraheme and GI symptoms Pt had 8.5 sec isolated P waves. Discussed PPM with he and his sisters at length, and watchful waiting decided upon.  Pt has had no symptoms of presyncope or syncope since his admission. Confirmed this with his sister over the phone.  Continue watchful waiting. His family knows to bring him to the Emergency room with any syncope  2. Cognitive deficits at baseline Discussed his care with sister over phone.   3. PAF/Atrial Flutter Continue Eliquis for CHA2DS2VASC of 5  Avoid AV nodal blocking agents.     Will see back in 6 months for continued monitoring.   Shirley Friar, PA-C  04/18/19 12:35 PM

## 2019-04-18 NOTE — Patient Instructions (Signed)
Medication Instructions:  none *If you need a refill on your cardiac medications before your next appointment, please call your pharmacy*  Lab Work: none If you have labs (blood work) drawn today and your tests are completely normal, you will receive your results only by: Marland Kitchen MyChart Message (if you have MyChart) OR . A paper copy in the mail If you have any lab test that is abnormal or we need to change your treatment, we will call you to review the results.  Testing/Procedures: none  Follow-Up: At [facility], you and your health needs are our priority.  As part of our continuing mission to provide you with exceptional heart care, we have created designated Provider Care Teams.  These Care Teams include your primary Cardiologist (physician) and Advanced Practice Providers (APPs -  Physician Assistants and Nurse Practitioners) who all work together to provide you with the care you need, when you need it.  Your next appointment:   6 month(s)  The format for your next appointment:   In Person  Provider:   Oda Kilts, PA  Other Instructions

## 2019-04-19 NOTE — Addendum Note (Signed)
Addended by: Jacinta Shoe on: 04/19/2019 01:38 PM   Modules accepted: Orders

## 2019-06-08 ENCOUNTER — Telehealth: Payer: Self-pay | Admitting: Cardiology

## 2019-06-08 NOTE — Telephone Encounter (Signed)
Will need to confirm if pt is still taking Eliquis since it does not appear as though this has been prescribed since Sept 2018 (for a 1 month supply at the time).  He should be taking Eliquis for afib with CHADS2VASc score of 5 (age, CHF, HTN, CVA in 2018). SCr 1.25, CrCl 56mL/min. Hgb 9.7, plt stable at 170.  If > 24 hour Eliquis hold is required, will need MD input given elevated cardiac risk and hx of CVA.

## 2019-06-08 NOTE — Telephone Encounter (Signed)
Okay to hold apixaban 2 days prior to procedure and resume after when okay with urology.  Will need input from Dr. Rayann Heman concerning risk related to heart block. Kirk Ruths

## 2019-06-08 NOTE — Telephone Encounter (Signed)
   [facility] Pre-operative Risk Assessment    Request for surgical clearance:  1. What type of surgery is being performed? Bladder biopsy and cauterization of bleeders  2. When is this surgery scheduled? Not yet  scheduled  3. What type of clearance is required (medical clearance vs. Pharmacy clearance to hold med vs. Both)? medical  4. Are there any medications that need to be held prior to surgery and how long? no  5. Practice name and name of physician performing surgery? [facility], Dr. Preston Fleeting  6. What is your office phone number: [phone]   7.   What is your office fax number: [phone]  8.   Anesthesia type (None, local, MAC, general) ? general    Ian Hill 06/08/2019, 3:59 PM  _________________________________________________________________   (provider comments below)

## 2019-06-08 NOTE — Telephone Encounter (Signed)
Patient does takes Elquis 5mg  BID, confirmed with sister who manages medications.

## 2019-06-08 NOTE — Telephone Encounter (Signed)
   Primary Cardiologist: Kirk Ruths, MD  Chart reviewed as part of pre-operative protocol coverage. Patient was contacted 06/08/2019 in reference to pre-operative risk assessment for pending surgery as outlined below.  Ian Hill was last seen on 04/18/2019 by Shirley Friar, PAC.  Since that day, Ian Hill has done well. No recurrent syncope/presyncope or dizziness. No cardiac symptoms. [name] getting about 4 mets of activity.   Hx of Intermittent CHB - This occurred while in the hospital for suspected shock s/p dose of feraheme and GI symptoms Pt had 8.5 sec isolated P waves. Per last OV note with APP "Discussed PPM with he and his sisters at length, and watchful waiting decided upon".   Dr. Allred/Dr. Stanford Breed, please give recommendations. Given hx he will be at risk for recurrent CHB under general anesthesia.   No requested for anticoagulation recommendation but will send pharmacy for review.   [place], [place] 06/08/2019, 4:29 PM

## 2019-06-10 ENCOUNTER — Other Ambulatory Visit: Payer: Self-pay | Admitting: Urology

## 2019-06-10 NOTE — Patient Instructions (Addendum)
DUE TO COVID-19 ONLY ONE VISITOR IS ALLOWED TO COME WITH YOU AND STAY IN THE WAITING ROOM ONLY DURING PRE OP AND PROCEDURE DAY OF SURGERY. THE 1 VISITOR MAY VISIT WITH YOU AFTER SURGERY IN YOUR PRIVATE ROOM DURING VISITING HOURS ONLY!  YOU NEED TO HAVE A COVID 19 TEST ON_______ @_______ , THIS TEST MUST BE DONE BEFORE SURGERY, COME  [place], [place]  , [id number].  ([place]) ONCE YOUR COVID TEST IS COMPLETED, PLEASE BEGIN THE QUARANTINE INSTRUCTIONS AS OUTLINED IN YOUR HANDOUT.                PRADYUN GOH     Your procedure is scheduled on: Thursday 06/16/2019   Report to [facility] Main  Entrance    Report to admitting at 0 8:15  AM     Call this number if you have problems the morning of surgery [phone]    Remember: Do not eat food or drink liquids :After Midnight.     BRUSH YOUR TEETH MORNING OF SURGERY AND RINSE YOUR MOUTH OUT, NO CHEWING GUM CANDY OR MINTS.     Take these medicines the morning of surgery with A SIP OF WATER: Tamulosin (Flomax), Finesteride (Proscar)                                 You may not have any metal on your body including hair pins and              piercings  Do not wear jewelry, make-up, lotions, powders or perfumes, deodorant                          Men may shave face and neck.   Do not bring valuables to the hospital. [place].  Contacts, dentures or bridgework may not be worn into surgery.  Leave suitcase in the car. After surgery it may be brought to your room.     Patients discharged the day of surgery will not be allowed to drive home. IF YOU ARE HAVING SURGERY AND GOING HOME THE SAME DAY, YOU MUST HAVE AN ADULT TO DRIVE YOU HOME AND  BE WITH YOU FOR 24 HOURS. YOU MAY GO HOME BY TAXI OR UBER OR ORTHERWISE, BUT AN ADULT MUST ACCOMPANY YOU HOME AND STAY WITH YOU FOR 24 HOURS.  Name and phone number of your driver:                Please read  over the following fact sheets you were given: _____________________________________________________________________             [facility] - Preparing for Surgery Before surgery, you can play an important role.  Because skin is not sterile, your skin needs to be as free of germs as possible.  You can reduce the number of germs on your skin by washing with CHG (chlorahexidine gluconate) soap before surgery.  CHG is an antiseptic cleaner which kills germs and bonds with the skin to continue killing germs even after washing. Please DO NOT use if you have an allergy to CHG or antibacterial soaps.  If your skin becomes reddened/irritated stop using the CHG and inform your nurse when you arrive at Short Stay. Do not shave (including legs and underarms) for at  least 48 hours prior to the first CHG shower.  You may shave your face/neck. Please follow these instructions carefully:  1.  Shower with CHG Soap the night before surgery and the  morning of Surgery.  2.  If you choose to wash your hair, wash your hair first as usual with your  normal  shampoo.  3.  After you shampoo, rinse your hair and body thoroughly to remove the  shampoo.                           4.  Use CHG as you would any other liquid soap.  You can apply chg directly  to the skin and wash                       Gently with a scrungie or clean washcloth.  5.  Apply the CHG Soap to your body ONLY FROM THE NECK DOWN.   Do not use on face/ open                           Wound or open sores. Avoid contact with eyes, ears mouth and genitals (private parts).                       Wash face,  Genitals (private parts) with your normal soap.             6.  Wash thoroughly, paying special attention to the area where your surgery  will be performed.  7.  Thoroughly rinse your body with warm water from the neck down.  8.  DO NOT shower/wash with your normal soap after using and rinsing off  the CHG Soap.             9.  Pat yourself dry with a  clean towel.            10.  Wear clean pajamas.            11.  Place clean sheets on your bed the night of your first shower and do not  sleep with pets. Day of Surgery : Do not apply any lotions/deodorants the morning of surgery.  Please wear clean clothes to the hospital/surgery center.  FAILURE TO FOLLOW THESE INSTRUCTIONS MAY RESULT IN THE CANCELLATION OF YOUR SURGERY PATIENT SIGNATURE_________________________________  NURSE SIGNATURE__________________________________  ________________________________________________________________________

## 2019-06-10 NOTE — Telephone Encounter (Signed)
Ok to proceed from EP standpoint with usual precautions.

## 2019-06-10 NOTE — Telephone Encounter (Signed)
I will route this recommendation to the requesting party via Epic fax function and remove from pre-op pool.  Please call with questions.  [place], [place] 06/10/2019, 8:35 AM

## 2019-06-14 ENCOUNTER — Encounter (HOSPITAL_COMMUNITY)
Admission: RE | Admit: 2019-06-14 | Discharge: 2019-06-14 | Disposition: A | Discharge status: Home / Self Care | Payer: Medicare (Managed Care) | Source: Ambulatory Visit | Attending: Urology | Admitting: Urology

## 2019-06-14 ENCOUNTER — Other Ambulatory Visit (HOSPITAL_COMMUNITY)
Admission: RE | Admit: 2019-06-14 | Discharge: 2019-06-14 | Disposition: A | Discharge status: Home / Self Care | Payer: Medicare (Managed Care) | Source: Ambulatory Visit | Attending: Urology | Admitting: Urology

## 2019-06-14 ENCOUNTER — Encounter (HOSPITAL_COMMUNITY): Payer: Self-pay

## 2019-06-14 ENCOUNTER — Other Ambulatory Visit: Payer: Self-pay

## 2019-06-14 DIAGNOSIS — Z01812 Encounter for preprocedural laboratory examination: Secondary | ICD-10-CM | POA: Diagnosis not present

## 2019-06-14 DIAGNOSIS — R319 Hematuria, unspecified: Secondary | ICD-10-CM | POA: Insufficient documentation

## 2019-06-14 DIAGNOSIS — Z20822 Contact with and (suspected) exposure to covid-19: Secondary | ICD-10-CM | POA: Insufficient documentation

## 2019-06-14 LAB — SARS CORONAVIRUS 2 (TAT 6-24 HRS): SARS Coronavirus 2: NEGATIVE

## 2019-06-14 NOTE — Progress Notes (Addendum)
PCP - Dr. Argentina Ponder Cardiologist - Dr. Thresa Ross  Chest x-ray - 03/12/19 EKG - 04/18/19 Stress Test - no ECHO - 03/09/19 Cardiac Cath - no  Sleep Study - no CPAP - no  Fasting Blood Sugar - NA Checks Blood Sugar _____ times a day  Blood Thinner Instructions: Eliquis Aspirin Instructions:Pt's Sister reports that Dr. Stanford Breed said he does not have to stop itfor surgery and that Dr. Diona Fanti is aware. Surgical clearance on chart states for Pt to hold Eliquis for 3 days . I called the Sister and she understood. Last Dose:06/13/19  Anesthesia review:   Patient denies shortness of breath, fever, cough and chest pain at PAT appointment Pt's Sister reports that he is very weak and feels "poorly in his stomach"  Patient verbalized understanding of instructions that were given to them at the PAT appointment. Patient was also instructed that they will need to review over the PAT instructions again at home before surgery.  Pt will be processed as a" Same Day". Labs will be done on DOS. The pre op instructions were read to the sister and she said that she understood.

## 2019-06-15 NOTE — Progress Notes (Signed)
Anesthesia Chart Review   Case: [id number] Date/Time: 06/16/19 1001   Procedure: CYSTOSCOPY WITH BIOPSY (N/A ) - 30 MINS   Anesthesia type: General   Pre-op diagnosis: HEMATURIA   Location: [place] / [facility]   Surgeons: Franchot Gallo, MD      DISCUSSION:69 y.o. current some day smoker (25.5 pack years) with h/o HTN, paroxysmal atrial fib/aflutter (on Eliquis), NSVT, h/o apical variant HCM, stroke, prostate cancer, CKD Stage II, hematuria scheduled for above procedure 06/16/2019 with Dr. Jenness Corner.   Per cardiologist, Dr. Kirk Ruths, 06/08/19, "Okay to hold apixaban 2 days prior to procedure and resume after when okay with urology.  Will need input from Dr. Rayann Heman concerning risk related to heart block."  Per electrophysiologist Dr. Thompson Grayer 1/292/21, "Ok to proceed from EP standpoint with usual precautions."  Anticipate pt can proceed with planned procedure barring acute status change.   VS: There were no vitals taken for this visit.  PROVIDERS: Linda Hedges, NP is PCP  Kirk Ruths, MD is Cardiologist   Thompson Grayer, MD is Electrophysiologist  LABS: labs DOS (all labs ordered are listed, but only abnormal results are displayed)  Labs Reviewed - No data to display   IMAGES:   EKG: 04/18/2019 Rate 96 bpm   CV: Echo 03/09/2019 IMPRESSIONS  1. Left ventricular ejection fraction, by visual estimation, is 60 to  65%. The left ventricle has normal function. Normal left ventricular size.  Left ventricular septal wall thickness was severely increased. Severely  increased left ventricular posterior  wall thickness. There is severely increased left ventricular hypertrophy.  2. Left ventricular diastolic function could not be evaluated pattern of  LV diastolic filling.  3. Severe hypertrophy most prominent in the septum and apex.  4. Global right ventricle has normal systolic function.The right  ventricular size is mildly enlarged. No  increase in right ventricular wall  thickness.  5. Left atrial size was moderately dilated.  6. Right atrial size was severely dilated.  7. The mitral valve is normal in structure. Mild mitral valve  regurgitation. No evidence of mitral stenosis.  8. The tricuspid valve is myxomatous. Tricuspid valve regurgitation  severe.  9. The aortic valve is normal in structure. Aortic valve regurgitation is  trivial by color flow Doppler. Structurally normal aortic valve, with no  evidence of sclerosis or stenosis.  10. There is Mild calcification of the aortic valve.  11. There is Moderate thickening of the aortic valve.  12. The pulmonic valve was normal in structure. Pulmonic valve  regurgitation is not visualized by color flow Doppler.  13. Moderately elevated pulmonary artery systolic pressure.  14. The inferior vena cava is dilated in size with <50% respiratory  variability, suggesting right atrial pressure of 15 mmHg.   Myocardial Perfusion 11/19/2016  There was no ST segment deviation noted during stress.  The study is normal.  This is a low risk study.   Normal resting and stress perfusion. No ischemia or infarction EF Not done due to PVCls TID borderline 1.2 Significant LVH Past Medical History:  Diagnosis Date  . Anticoagulant long-term use    eliquis  . Bladder tumor   . BPH (benign prostatic hyperplasia)   . CKD (chronic kidney disease), stage II   . Diverticulosis   . Dyslipidemia   . History of CVA with residual deficit 01/28/2017   acute right MCA infarc with M2 embolic occlusion w/p  tPA and mechnical thrombectomy---  residual left side weakness  . History of  external beam radiation therapy    03-23-2018  to 05-03-2018  prostate cancer  . Hypertension   . Hypertensive heart disease   . Hypertrophic cardiomegaly, apical variant   . Hypertrophic obstructive cardiomyopathy with diastolic heart failure [facility])    cardiologist-- dr Stanford Breed  . Mitral regurgitation    . Other problems related to education and literacy    can not read  . Prostate cancer [facility]) urologist-  dr dahlstedt/  oncologsit-- dr Tammi Klippel   dx 07/ 2018---  Stage T1a,  Gleason 4+3--- completed external radiation therapy 05-03-2018  . Stroke [facility]) 2019    Past Surgical History:  Procedure Laterality Date  . 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 CARDIOVASCULAR STRESS TEST  11-19-2016  dr Stanford Breed   Low risk nuclear study/  normal resting and stress perfusion with no ischemia or infarction/  EF not done due to PVCIs , TID borderline, 1.2 significant LVH  . GOLD SEED IMPLANT N/A 03/10/2018   Procedure: GOLD SEED IMPLANT;  Surgeon: Franchot Gallo, MD;  Location: [facility];  Service: Urology;  Laterality: N/A;  . IR PERCUTANEOUS ART THROMBECTOMY/INFUSION INTRACRANIAL INC DIAG ANGIO  01/23/2017  . RADIOLOGY WITH ANESTHESIA N/A 01/23/2017   Procedure: RADIOLOGY WITH ANESTHESIA;  Surgeon: Luanne Bras, MD;  Location: [place];  Service: Radiology;  Laterality: N/A;  . SPACE OAR INSTILLATION N/A 03/10/2018   Procedure: SPACE OAR INSTILLATION;  Surgeon: Franchot Gallo, MD;  Location: [facility];  Service: Urology;  Laterality: N/A;  . TRANSTHORACIC ECHOCARDIOGRAM  11-20-2016   dr Stanford Breed   mild LVH,  apical hypertrophic cardiomyopathy, systolic function vigorous,  ef 65-70%,  grade 2 diastolic dysfunction/  mild AR, MR, and  PR/  moderate LAE and RAE/  mild to mod. TR/  moderate increase PASP 16mmHg  . TRANSURETHRAL RESECTION OF BLADDER TUMOR WITH MITOMYCIN-C N/A 12/09/2018   Procedure: TRANSURETHRAL RESECTION OF BLADDER TUMOR WITH GEMCITABINE GIVEN IN PACU;  Surgeon: Franchot Gallo, MD;  Location: [facility]  Service: Urology;  Laterality: N/A;  . TRANSURETHRAL RESECTION OF PROSTATE N/A 11/24/2016   Procedure: TRANSURETHRAL RESECTION OF THE PROSTATE (TURP);  Surgeon: Franchot Gallo, MD;   Location: [facility];  Service: Urology;  Laterality: N/A;    MEDICATIONS: . apixaban (ELIQUIS) 5 MG TABS tablet  . AYR SALINE NASAL NA  . Calcium Carbonate-Simethicone (TUMS GAS RELIEF CHEWY BITES PO)  . Cholecalciferol (VITAMIN D) 2000 units CAPS  . feeding supplement, ENSURE ENLIVE, (ENSURE ENLIVE) LIQD  . finasteride (PROSCAR) 5 MG tablet  . nicotine polacrilex (NICORETTE) 2 MG gum  . phenazopyridine (PYRIDIUM) 200 MG tablet  . polyethylene glycol (MIRALAX / GLYCOLAX) packet  . rosuvastatin (CRESTOR) 10 MG tablet  . tamsulosin (FLOMAX) 0.4 MG CAPS capsule   No current facility-administered medications for this encounter.   Maia Plan [facility] Pre-Surgical Testing [phone] 06/15/19 10:44 AM

## 2019-06-16 ENCOUNTER — Ambulatory Visit (HOSPITAL_COMMUNITY): Payer: Medicare (Managed Care)

## 2019-06-16 ENCOUNTER — Other Ambulatory Visit: Payer: Self-pay

## 2019-06-16 ENCOUNTER — Encounter (HOSPITAL_COMMUNITY): Payer: Self-pay | Admitting: Urology

## 2019-06-16 ENCOUNTER — Encounter (HOSPITAL_COMMUNITY)
Admission: RE | Disposition: A | Discharge status: Home / Self Care | Payer: Self-pay | Source: Other Acute Inpatient Hospital | Attending: Urology

## 2019-06-16 ENCOUNTER — Ambulatory Visit (HOSPITAL_COMMUNITY)
Admission: RE | Admit: 2019-06-16 | Discharge: 2019-06-18 | Disposition: A | Discharge status: Home / Self Care | Payer: Medicare (Managed Care) | Source: Other Acute Inpatient Hospital | Attending: Urology | Admitting: Urology

## 2019-06-16 ENCOUNTER — Ambulatory Visit (HOSPITAL_COMMUNITY): Payer: Medicare (Managed Care) | Admitting: Physician Assistant

## 2019-06-16 ENCOUNTER — Ambulatory Visit (HOSPITAL_COMMUNITY): Payer: Medicare (Managed Care) | Admitting: Certified Registered"

## 2019-06-16 DIAGNOSIS — I071 Rheumatic tricuspid insufficiency: Secondary | ICD-10-CM | POA: Insufficient documentation

## 2019-06-16 DIAGNOSIS — I421 Obstructive hypertrophic cardiomyopathy: Secondary | ICD-10-CM | POA: Diagnosis not present

## 2019-06-16 DIAGNOSIS — E43 Unspecified severe protein-calorie malnutrition: Secondary | ICD-10-CM | POA: Diagnosis not present

## 2019-06-16 DIAGNOSIS — Z681 Body mass index (BMI) 19 or less, adult: Secondary | ICD-10-CM | POA: Insufficient documentation

## 2019-06-16 DIAGNOSIS — D62 Acute posthemorrhagic anemia: Secondary | ICD-10-CM | POA: Diagnosis not present

## 2019-06-16 DIAGNOSIS — I69398 Other sequelae of cerebral infarction: Secondary | ICD-10-CM | POA: Insufficient documentation

## 2019-06-16 DIAGNOSIS — Z8546 Personal history of malignant neoplasm of prostate: Secondary | ICD-10-CM | POA: Insufficient documentation

## 2019-06-16 DIAGNOSIS — R310 Gross hematuria: Secondary | ICD-10-CM | POA: Diagnosis not present

## 2019-06-16 DIAGNOSIS — R4189 Other symptoms and signs involving cognitive functions and awareness: Secondary | ICD-10-CM | POA: Insufficient documentation

## 2019-06-16 DIAGNOSIS — N182 Chronic kidney disease, stage 2 (mild): Secondary | ICD-10-CM

## 2019-06-16 DIAGNOSIS — Z7901 Long term (current) use of anticoagulants: Secondary | ICD-10-CM | POA: Diagnosis not present

## 2019-06-16 DIAGNOSIS — I4821 Permanent atrial fibrillation: Secondary | ICD-10-CM | POA: Insufficient documentation

## 2019-06-16 DIAGNOSIS — E785 Hyperlipidemia, unspecified: Secondary | ICD-10-CM | POA: Insufficient documentation

## 2019-06-16 DIAGNOSIS — F17210 Nicotine dependence, cigarettes, uncomplicated: Secondary | ICD-10-CM | POA: Insufficient documentation

## 2019-06-16 DIAGNOSIS — I130 Hypertensive heart and chronic kidney disease with heart failure and stage 1 through stage 4 chronic kidney disease, or unspecified chronic kidney disease: Secondary | ICD-10-CM | POA: Insufficient documentation

## 2019-06-16 DIAGNOSIS — Z79899 Other long term (current) drug therapy: Secondary | ICD-10-CM | POA: Diagnosis not present

## 2019-06-16 DIAGNOSIS — I5032 Chronic diastolic (congestive) heart failure: Secondary | ICD-10-CM | POA: Insufficient documentation

## 2019-06-16 DIAGNOSIS — R531 Weakness: Secondary | ICD-10-CM | POA: Insufficient documentation

## 2019-06-16 DIAGNOSIS — I69354 Hemiplegia and hemiparesis following cerebral infarction affecting left non-dominant side: Secondary | ICD-10-CM | POA: Insufficient documentation

## 2019-06-16 DIAGNOSIS — Z923 Personal history of irradiation: Secondary | ICD-10-CM | POA: Diagnosis not present

## 2019-06-16 DIAGNOSIS — C675 Malignant neoplasm of bladder neck: Secondary | ICD-10-CM | POA: Diagnosis not present

## 2019-06-16 DIAGNOSIS — C678 Malignant neoplasm of overlapping sites of bladder: Secondary | ICD-10-CM | POA: Diagnosis present

## 2019-06-16 DIAGNOSIS — R2689 Other abnormalities of gait and mobility: Secondary | ICD-10-CM | POA: Diagnosis not present

## 2019-06-16 DIAGNOSIS — N400 Enlarged prostate without lower urinary tract symptoms: Secondary | ICD-10-CM | POA: Insufficient documentation

## 2019-06-16 DIAGNOSIS — N183 Chronic kidney disease, stage 3 unspecified: Secondary | ICD-10-CM | POA: Insufficient documentation

## 2019-06-16 DIAGNOSIS — R0989 Other specified symptoms and signs involving the circulatory and respiratory systems: Secondary | ICD-10-CM | POA: Insufficient documentation

## 2019-06-16 DIAGNOSIS — I480 Paroxysmal atrial fibrillation: Secondary | ICD-10-CM

## 2019-06-16 HISTORY — PX: CYSTOSCOPY WITH BIOPSY: SHX5122

## 2019-06-16 LAB — BASIC METABOLIC PANEL
Anion gap: 7 (ref 5–15)
BUN: 23 mg/dL (ref 8–23)
CO2: 25 mmol/L (ref 22–32)
Calcium: 9.3 mg/dL (ref 8.9–10.3)
Chloride: 107 mmol/L (ref 98–111)
Creatinine, Ser: 1.37 mg/dL — ABNORMAL HIGH (ref 0.61–1.24)
GFR calc Af Amer: >60 mL/min (ref >60)
GFR calc non Af Amer: 52 mL/min — ABNORMAL LOW (ref >60)
Glucose, Bld: 106 mg/dL — ABNORMAL HIGH (ref 70–99)
Potassium: 5.1 mmol/L (ref 3.5–5.1)
Sodium: 139 mmol/L (ref 135–145)

## 2019-06-16 LAB — CBC
HCT: 23.6 % — ABNORMAL LOW (ref 39.0–52.0)
Hemoglobin: 7.2 g/dL — ABNORMAL LOW (ref 13.0–17.0)
MCH: 27.3 pg (ref 26.0–34.0)
MCHC: 30.5 g/dL (ref 30.0–36.0)
MCV: 89.4 fL (ref 80.0–100.0)
Platelets: 279 10*3/uL (ref 150–400)
RBC: 2.64 MIL/uL — ABNORMAL LOW (ref 4.22–5.81)
RDW: 17.1 % — ABNORMAL HIGH (ref 11.5–15.5)
WBC: 4.2 10*3/uL (ref 4.0–10.5)
nRBC: 1.7 % — ABNORMAL HIGH (ref 0.0–0.2)

## 2019-06-16 LAB — PREPARE RBC (CROSSMATCH)

## 2019-06-16 LAB — ABO/RH: ABO/RH(D): A POS

## 2019-06-16 SURGERY — CYSTOSCOPY, WITH BIOPSY
Anesthesia: General

## 2019-06-16 MED ORDER — ACETAMINOPHEN 325 MG PO TABS: 650.0000 mg | ORAL_TABLET | ORAL | Status: DC | PRN

## 2019-06-16 MED ORDER — IOHEXOL 300 MG/ML  SOLN
100.0000 mL | Freq: Once | INTRAMUSCULAR | Status: AC | PRN
  Administered 2019-06-16: 80 mL via INTRAVENOUS

## 2019-06-16 MED ORDER — DIGOXIN 125 MCG PO TABS
0.1250 mg | ORAL_TABLET | Freq: Every day | ORAL | Status: DC
  Administered 2019-06-16 – 2019-06-18 (×3): 0.125 mg via ORAL
  Filled 2019-06-16 (×3): qty 1

## 2019-06-16 MED ORDER — CEFAZOLIN SODIUM-DEXTROSE 2-4 GM/100ML-% IV SOLN
2.0000 g | INTRAVENOUS | Status: AC
  Administered 2019-06-16: 11:00:00 2 g via INTRAVENOUS
  Filled 2019-06-16: qty 100

## 2019-06-16 MED ORDER — NICOTINE 14 MG/24HR TD PT24
14.0000 mg | MEDICATED_PATCH | Freq: Every day | TRANSDERMAL | Status: DC
  Administered 2019-06-16 – 2019-06-18 (×3): 14 mg via TRANSDERMAL
  Filled 2019-06-16 (×3): qty 1

## 2019-06-16 MED ORDER — STERILE WATER FOR IRRIGATION IR SOLN
Status: DC | PRN
  Administered 2019-06-16: 6000 mL

## 2019-06-16 MED ORDER — PHENYLEPHRINE 40 MCG/ML (10ML) SYRINGE FOR IV PUSH (FOR BLOOD PRESSURE SUPPORT)
PREFILLED_SYRINGE | INTRAVENOUS | Status: AC
  Filled 2019-06-16: qty 10

## 2019-06-16 MED ORDER — FENTANYL CITRATE (PF) 100 MCG/2ML IJ SOLN
INTRAMUSCULAR | Status: AC
  Filled 2019-06-16: qty 2

## 2019-06-16 MED ORDER — PHENYLEPHRINE HCL (PRESSORS) 10 MG/ML IV SOLN
INTRAVENOUS | Status: DC | PRN
  Administered 2019-06-16: 120 ug via INTRAVENOUS

## 2019-06-16 MED ORDER — TAMSULOSIN HCL 0.4 MG PO CAPS
0.4000 mg | ORAL_CAPSULE | Freq: Every day | ORAL | Status: DC
  Administered 2019-06-16 – 2019-06-18 (×3): 0.4 mg via ORAL
  Filled 2019-06-16 (×3): qty 1

## 2019-06-16 MED ORDER — ROSUVASTATIN CALCIUM 10 MG PO TABS
10.0000 mg | ORAL_TABLET | Freq: Every day | ORAL | Status: DC
  Administered 2019-06-16 – 2019-06-17 (×2): 10 mg via ORAL
  Filled 2019-06-16 (×2): qty 1

## 2019-06-16 MED ORDER — PROPOFOL 10 MG/ML IV BOLUS
INTRAVENOUS | Status: AC
  Filled 2019-06-16: qty 20

## 2019-06-16 MED ORDER — ONDANSETRON HCL 4 MG/2ML IJ SOLN: 4.0000 mg | Freq: Once | INTRAMUSCULAR | Status: DC | PRN

## 2019-06-16 MED ORDER — LIDOCAINE 2% (20 MG/ML) 5 ML SYRINGE
INTRAMUSCULAR | Status: DC | PRN
  Administered 2019-06-16: 80 mg via INTRAVENOUS

## 2019-06-16 MED ORDER — LIDOCAINE 2% (20 MG/ML) 5 ML SYRINGE
INTRAMUSCULAR | Status: AC
  Filled 2019-06-16: qty 5

## 2019-06-16 MED ORDER — DEXAMETHASONE SODIUM PHOSPHATE 10 MG/ML IJ SOLN
INTRAMUSCULAR | Status: DC | PRN
  Administered 2019-06-16: 4 mg via INTRAVENOUS

## 2019-06-16 MED ORDER — FENTANYL CITRATE (PF) 100 MCG/2ML IJ SOLN
INTRAMUSCULAR | Status: DC | PRN
  Administered 2019-06-16 (×2): 12.5 ug via INTRAVENOUS

## 2019-06-16 MED ORDER — SULFAMETHOXAZOLE-TRIMETHOPRIM 800-160 MG PO TABS
1.0000 | ORAL_TABLET | Freq: Two times a day (BID) | ORAL | Status: DC
  Administered 2019-06-16 – 2019-06-18 (×5): 1 via ORAL
  Filled 2019-06-16 (×5): qty 1

## 2019-06-16 MED ORDER — 0.9 % SODIUM CHLORIDE (POUR BTL) OPTIME
TOPICAL | Status: DC | PRN
  Administered 2019-06-16: 11:00:00 1000 mL

## 2019-06-16 MED ORDER — PROPOFOL 10 MG/ML IV BOLUS
INTRAVENOUS | Status: DC | PRN
  Administered 2019-06-16: 50 mg via INTRAVENOUS

## 2019-06-16 MED ORDER — BELLADONNA ALKALOIDS-OPIUM 16.2-60 MG RE SUPP: 1.0000 | Freq: Four times a day (QID) | RECTAL | Status: DC | PRN

## 2019-06-16 MED ORDER — ONDANSETRON HCL 4 MG/2ML IJ SOLN
INTRAMUSCULAR | Status: DC | PRN
  Administered 2019-06-16: 4 mg via INTRAVENOUS

## 2019-06-16 MED ORDER — FENTANYL CITRATE (PF) 100 MCG/2ML IJ SOLN: 25.0000 ug | INTRAMUSCULAR | Status: DC | PRN

## 2019-06-16 MED ORDER — DEXAMETHASONE SODIUM PHOSPHATE 10 MG/ML IJ SOLN
INTRAMUSCULAR | Status: AC
  Filled 2019-06-16: qty 1

## 2019-06-16 MED ORDER — ONDANSETRON HCL 4 MG/2ML IJ SOLN: 4.0000 mg | INTRAMUSCULAR | Status: DC | PRN

## 2019-06-16 MED ORDER — LACTATED RINGERS IV SOLN: INTRAVENOUS | Status: DC

## 2019-06-16 MED ORDER — SODIUM CHLORIDE 0.9% IV SOLUTION: Freq: Once | INTRAVENOUS | Status: AC

## 2019-06-16 MED ORDER — ETOMIDATE 2 MG/ML IV SOLN
INTRAVENOUS | Status: DC | PRN
  Administered 2019-06-16: 12 mg via INTRAVENOUS

## 2019-06-16 MED ORDER — ETOMIDATE 2 MG/ML IV SOLN
INTRAVENOUS | Status: AC
  Filled 2019-06-16: qty 10

## 2019-06-16 MED ORDER — ALBUMIN HUMAN 5 % IV SOLN: INTRAVENOUS | Status: DC | PRN

## 2019-06-16 MED ORDER — PHENYLEPHRINE HCL (PRESSORS) 10 MG/ML IV SOLN
INTRAVENOUS | Status: AC
  Filled 2019-06-16: qty 1

## 2019-06-16 MED ORDER — ACETAMINOPHEN 500 MG PO TABS
1000.0000 mg | ORAL_TABLET | Freq: Once | ORAL | Status: AC
  Administered 2019-06-16: 1000 mg via ORAL
  Filled 2019-06-16: qty 2

## 2019-06-16 MED ORDER — POLYETHYLENE GLYCOL 3350 17 G PO PACK: 17.0000 g | PACK | Freq: Every day | ORAL | Status: DC | PRN

## 2019-06-16 MED ORDER — NICOTINE POLACRILEX 2 MG MT GUM
2.0000 mg | CHEWING_GUM | OROMUCOSAL | Status: DC | PRN
  Filled 2019-06-16: qty 1

## 2019-06-16 MED ORDER — HYDROCODONE-ACETAMINOPHEN 5-325 MG PO TABS: 1.0000 | ORAL_TABLET | ORAL | Status: DC | PRN

## 2019-06-16 MED ORDER — ONDANSETRON HCL 4 MG/2ML IJ SOLN
INTRAMUSCULAR | Status: AC
  Filled 2019-06-16: qty 2

## 2019-06-16 MED ORDER — SODIUM CHLORIDE 0.9 % IV SOLN: INTRAVENOUS | Status: DC

## 2019-06-16 SURGICAL SUPPLY — 19 items
BAG URINE DRAIN 2000ML AR STRL (UROLOGICAL SUPPLIES) IMPLANT
BAG URO CATCHER STRL LF (MISCELLANEOUS) ×3 IMPLANT
CATH FOLEY 2WAY SLVR 30CC 24FR (CATHETERS) IMPLANT
ELECT REM PT RETURN 15FT ADLT (MISCELLANEOUS) ×3 IMPLANT
EVACUATOR MICROVAS BLADDER (UROLOGICAL SUPPLIES) IMPLANT
GLOVE BIOGEL M 8.0 STRL (GLOVE) ×3 IMPLANT
GOWN STRL REUS W/TWL XL LVL3 (GOWN DISPOSABLE) ×3 IMPLANT
KIT TURNOVER KIT A (KITS) IMPLANT
LOOP MONOPOLAR YLW (ELECTROSURGICAL) IMPLANT
MANIFOLD NEPTUNE II (INSTRUMENTS) ×3 IMPLANT
NDL SAFETY ECLIPSE 18X1.5 (NEEDLE) ×1 IMPLANT
NEEDLE HYPO 18GX1.5 SHARP (NEEDLE) ×2
PACK CYSTO (CUSTOM PROCEDURE TRAY) ×3 IMPLANT
PENCIL SMOKE EVACUATOR (MISCELLANEOUS) IMPLANT
SYRINGE IRR TOOMEY STRL 70CC (SYRINGE) ×3 IMPLANT
TUBING CONNECTING 10 (TUBING) ×2 IMPLANT
TUBING CONNECTING 10' (TUBING) ×1
TUBING UROLOGY SET (TUBING) ×3 IMPLANT
WATER STERILE IRR 3000ML UROMA (IV SOLUTION) ×3 IMPLANT

## 2019-06-16 NOTE — Anesthesia Postprocedure Evaluation (Signed)
Anesthesia Post Note  Patient: Ian Hill  Procedure(s) Performed: TRANSURETHERAL RESECTION OF BLADDER TUMOR. (N/A )     Patient location during evaluation: PACU Anesthesia Type: General Level of consciousness: awake and alert Pain management: pain level controlled Vital Signs Assessment: post-procedure vital signs reviewed and stable Respiratory status: spontaneous breathing, nonlabored ventilation and respiratory function stable Cardiovascular status: blood pressure returned to baseline and stable Postop Assessment: no apparent nausea or vomiting Anesthetic complications: no    Last Vitals:  Vitals:   06/16/19 1246 06/16/19 1330  BP: (!) 161/80 (!) 158/88  Pulse: 74 94  Resp:  17  Temp: (!) 36.2 C (!) 36.4 C  SpO2: 90% 93%    Last Pain:  Vitals:   06/16/19 1330  TempSrc: Axillary  PainSc:                  Catalina Gravel

## 2019-06-16 NOTE — Consult Note (Addendum)
History and Physical    Ian Hill [id number] DOB: 1950-06-26 DOA: 06/16/2019  PCP: Linda Hedges, NP Patient coming from: Home  Reason for consult: Medical management Consulting physician: Franchot Gallo, MD  HPI: Ian Hill is a 69 y.o. male with medical history significant of bladder cancer, prostate cancer, CKD stage II, diverticulosis, hyperlipidemia, CVA, hypertension, hypertrophic cardiomegaly.  Patient presented to the hospital for planned TURBT performed by urology.  Patient noted to have significant anemia with concern for weakness and hypoxia.  Patient given 2 units of PRBC and CT abdomen pelvis was ordered.  Patient reports that he has been having weakness with ambulation for the last week and reports that he feels that his leg is giving out when he tries to walk.  No reported chest pain, shortness of breath, palpitations.  Review of Systems: Review of Systems  Constitutional: Negative for chills and fever.  Respiratory: Negative for shortness of breath.   Cardiovascular: Negative for chest pain.  Gastrointestinal: Negative for nausea and vomiting.  All other systems reviewed and are negative.   Past Medical History:  Diagnosis Date  . Anticoagulant long-term use    eliquis  . Bladder tumor   . BPH (benign prostatic hyperplasia)   . CKD (chronic kidney disease), stage II   . Diverticulosis   . Dyslipidemia   . History of CVA with residual deficit 01/28/2017   acute right MCA infarc with M2 embolic occlusion w/p  tPA and mechnical thrombectomy---  residual left side weakness  . History of external beam radiation therapy    03-23-2018  to 05-03-2018  prostate cancer  . Hypertension   . Hypertensive heart disease   . Hypertrophic cardiomegaly, apical variant   . Hypertrophic obstructive cardiomyopathy with diastolic heart failure [facility])    cardiologist-- dr Stanford Breed  . Mitral regurgitation   . Other problems related to education and literacy    can not read  . Prostate cancer [facility]) urologist-  dr dahlstedt/  oncologsit-- dr Tammi Klippel   dx 07/ 2018---  Stage T1a,  Gleason 4+3--- completed external radiation therapy 05-03-2018  . Stroke [facility]) 2019    Past Surgical History:  Procedure Laterality Date  . 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 CARDIOVASCULAR STRESS TEST  11-19-2016  dr Stanford Breed   Low risk nuclear study/  normal resting and stress perfusion with no ischemia or infarction/  EF not done due to PVCIs , TID borderline, 1.2 significant LVH  . GOLD SEED IMPLANT N/A 03/10/2018   Procedure: GOLD SEED IMPLANT;  Surgeon: Franchot Gallo, MD;  Location: [facility];  Service: Urology;  Laterality: N/A;  . IR PERCUTANEOUS ART THROMBECTOMY/INFUSION INTRACRANIAL INC DIAG ANGIO  01/23/2017  . RADIOLOGY WITH ANESTHESIA N/A 01/23/2017   Procedure: RADIOLOGY WITH ANESTHESIA;  Surgeon: Luanne Bras, MD;  Location: [place];  Service: Radiology;  Laterality: N/A;  . SPACE OAR INSTILLATION N/A 03/10/2018   Procedure: SPACE OAR INSTILLATION;  Surgeon: Franchot Gallo, MD;  Location[facility];  Service: Urology;  Laterality: N/A;  . TRANSTHORACIC ECHOCARDIOGRAM  11-20-2016   dr Stanford Breed   mild LVH,  apical hypertrophic cardiomyopathy, systolic function vigorous,  ef 65-70%,  grade 2 diastolic dysfunction/  mild AR, MR, and  PR/  moderate LAE and RAE/  mild to mod. TR/  moderate increase PASP 8mmHg  . TRANSURETHRAL RESECTION OF BLADDER TUMOR WITH MITOMYCIN-C N/A 12/09/2018   Procedure: TRANSURETHRAL RESECTION OF  BLADDER TUMOR WITH GEMCITABINE GIVEN IN PACU;  Surgeon: Franchot Gallo, MD;  Location: [facility];  Service: Urology;  Laterality: N/A;  . TRANSURETHRAL RESECTION OF PROSTATE N/A 11/24/2016   Procedure: TRANSURETHRAL RESECTION OF THE PROSTATE (TURP);  Surgeon: Franchot Gallo, MD;  Location: [facility];  Service:  Urology;  Laterality: N/A;     reports that he has been smoking cigarettes. He has a 25.50 pack-year smoking history. He has never used smokeless tobacco. He reports that he does not drink alcohol or use drugs.  Allergies  Allergen Reactions  . Feraheme [Ferumoxytol] Anaphylaxis    Family History  Problem Relation Age of Onset  . Cancer Paternal Aunt        unknown   Prior to Admission medications   Medication Sig Start Date End Date Taking? Authorizing Provider  apixaban (ELIQUIS) 5 MG TABS tablet Take 1 tablet (5 mg total) by mouth 2 (two) times daily. 02/03/17  Yes Angiulli, Lavon Paganini, PA-C  AYR SALINE NASAL NA Place 2 sprays into the nose 4 (four) times daily as needed.   Yes [provider]  Calcium Carbonate-Simethicone (TUMS GAS RELIEF CHEWY BITES PO) Take 1 tablet by mouth daily as needed (for gas).    Yes [provider]  Cholecalciferol (VITAMIN D) 2000 units CAPS Take 2,000 Units by mouth daily.   Yes [provider]  digoxin (LANOXIN) 0.125 MG tablet Take 0.125 mg by mouth daily.   Yes [provider]  feeding supplement, ENSURE ENLIVE, (ENSURE ENLIVE) LIQD Take 237 mLs by mouth 3 (three) times daily. 11/15/17  Yes Lorella Nimrod, MD  finasteride (PROSCAR) 5 MG tablet Take 1 tablet (5 mg total) by mouth daily. 02/03/17  Yes Angiulli, Lavon Paganini, PA-C  nicotine (NICODERM CQ - DOSED IN MG/24 HOURS) 14 mg/24hr patch Place 14 mg onto the skin daily.   Yes [provider]  nicotine polacrilex (NICORETTE) 2 MG gum Take 2 mg by mouth as needed for smoking cessation.   Yes [provider]  phenazopyridine (PYRIDIUM) 200 MG tablet Take 200 mg by mouth 2 (two) times daily as needed for pain.   Yes [provider]  polyethylene glycol (MIRALAX / GLYCOLAX) packet Take 17 g by mouth daily as needed. 11/15/17  Yes Lorella Nimrod, MD  rosuvastatin (CRESTOR) 10 MG tablet Take 10 mg by mouth at bedtime.    Yes [provider]    tamsulosin (FLOMAX) 0.4 MG CAPS capsule Take 1 capsule (0.4 mg total) by mouth daily. 02/03/17  Yes Angiulli, Lavon Paganini, PA-C    Physical Exam:  Physical Exam Vitals reviewed.  Constitutional:      General: [name] not in acute distress.    Appearance: [name] well-developed. [name] not diaphoretic.  HENT:     Mouth/Throat:     Mouth: Mucous membranes are moist.  Eyes:     Conjunctiva/sclera: Conjunctivae normal.     Pupils: Pupils are equal, round, and reactive to light.  Cardiovascular:     Rate and Rhythm: Normal rate and regular rhythm.     Heart sounds: Normal heart sounds. No murmur.  Pulmonary:     Effort: Pulmonary effort is normal. No respiratory distress.     Breath sounds: Rales (mild) present. No wheezing.  Abdominal:     General: Bowel sounds are normal. There is no distension.     Palpations: Abdomen is soft.     Tenderness: There is no abdominal tenderness. There is no  guarding or rebound.  Musculoskeletal:        General: No tenderness. Normal range of motion.     Cervical back: Normal range of motion.  Lymphadenopathy:     Cervical: No cervical adenopathy.  Skin:    General: Skin is warm and dry.  Neurological:     Mental Status: [name] alert and oriented to person, place, and time.     Comments: 4/5 LE strength bilaterally     Labs on Admission: I have personally reviewed following labs and imaging studies  CBC: Recent Labs  Lab 06/16/19 0850  WBC 4.2  HGB 7.2*  HCT 23.6*  MCV 89.4  PLT 123XX123    Basic Metabolic Panel: Recent Labs  Lab 06/16/19 0850  NA 139  K 5.1  CL 107  CO2 25  GLUCOSE 106*  BUN 23  CREATININE 1.37*  CALCIUM 9.3    GFR: CrCl cannot be calculated (Unknown ideal weight.).  Liver Function Tests: No results for input(s): AST, ALT, ALKPHOS, BILITOT, PROT, ALBUMIN in the last 168 hours. No results for input(s): LIPASE, AMYLASE in the last 168 hours. No results for input(s): AMMONIA in the last 168 hours.  Coagulation  Profile: No results for input(s): INR, PROTIME in the last 168 hours.  Cardiac Enzymes: No results for input(s): CKTOTAL, CKMB, CKMBINDEX, TROPONINI in the last 168 hours.  BNP (last 3 results) No results for input(s): PROBNP in the last 8760 hours.  HbA1C: No results for input(s): HGBA1C in the last 72 hours.  CBG: No results for input(s): GLUCAP in the last 168 hours.  Lipid Profile: No results for input(s): CHOL, HDL, LDLCALC, TRIG, CHOLHDL, LDLDIRECT in the last 72 hours.  Thyroid Function Tests: No results for input(s): TSH, T4TOTAL, FREET4, T3FREE, THYROIDAB in the last 72 hours.  Anemia Panel: No results for input(s): VITAMINB12, FOLATE, FERRITIN, TIBC, IRON, RETICCTPCT in the last 72 hours.  Urine analysis:    Component Value Date/Time   COLORURINE YELLOW 11/13/2017 1108   APPEARANCEUR CLEAR 11/13/2017 1108   LABSPEC 1.045 (H) 11/13/2017 1108   PHURINE 5.0 11/13/2017 1108   GLUCOSEU NEGATIVE 11/13/2017 1108   HGBUR MODERATE (A) 11/13/2017 1108   BILIRUBINUR NEGATIVE 11/13/2017 1108   BILIRUBINUR Negative 08/29/2016 1618   KETONESUR NEGATIVE 11/13/2017 1108   PROTEINUR NEGATIVE 11/13/2017 1108   UROBILINOGEN 0.2 08/29/2016 1618   NITRITE NEGATIVE 11/13/2017 1108   LEUKOCYTESUR NEGATIVE 11/13/2017 1108     Radiological Exams on Admission: No results found.  Assessment/Plan Active Problems:   Malignant neoplasm of overlapping sites of bladder (HCC)  Acute on chronic blood loss anemia Gross hematuria Baseline hemoglobin appears to be around 8 g/dL.  Hemoglobin prior to procedure of 7.2.  Patient received 2 units of PRBC per primary team.  Currently, patient does not have hematuria in foley tube. -Post-transfusion H&H -Repeat CBC in AM -Hematuria per primary  Rales Patient received 700 mL of lactated Ringer's, 250 mL of 5% albumin.  IV fluids while in the OR. Currently on IV fluids.  Patient without respiratory symptoms.  No history of heart failure with  last EF of 60 to 65% although there was mention of ventricular hypertrophy. -Two-view chest x-ray -Hold IV fluids since patient is able to take PO fluids  Weakness Patient describes his legs giving out. Could not get in touch with sister, but on chart review, appears patient ambulates with a rolling walker. Possibly exacerbated by anemia. Agree with PT consult.  Paroxysmal atrial fibrillation/flutter Patient is currently  rate controlled.  Appears to be in sinus rhythm at this time.   Patient is on digoxin and Eliquis as an outpatient. Avoid AV nodal blocking agents. -Continue digoxin -Holding Eliquis secondary to gross hematuria  Hyperlipidemia -Continue Crestor 10 mg  History of intermittent complete heart block History of sinus pauses PPM discussed and declined per family discussion per chart review. In the setting of atenolol and shock from feraheme infusion. Atenolol previously discontinued.  CKD stage II Creatinine baseline and stable.  History of prostate and bladder cancer Patient is  status post external beam radiotherapy and has a history of TURBT. TURBT performed today revealing recurrence of 3 cm urothelial carcinoma bladder neck in addition to satellite lesion in the superior trigonal region measuring 1 cm.  Patient has Foley catheter placed. -Per primary -Flomax, Foley, Bactrim  Severe tricuspid valve regurgitation Biatrial enlargement Noted on Transthoracic Echocardiogram from 02/2019  Cognitive deficits Appears baseline.  History of CVA -Continue Crestor   DVT prophylaxis: Per primary Code Status: Full code Family Communication: Called sister via telephone with no reply Disposition Plan: Per primary   Cordelia Poche, MD [facility] 06/16/2019, 5:31 PM

## 2019-06-16 NOTE — Op Note (Signed)
Preoperative diagnosis: Gross hematuria with history of prostate cancer, status post external beam radiotherapy as well as bladder cancer with history of TURBT  Postoperative diagnosis: Same, with recurrence of 3 cm urothelial carcinoma at the bladder neck on the right, small satellite lesion in the superior trigonal region, 1 cm  Principal procedure: TURBT 3 cm urothelial carcinoma  Surgeon: Lera Gaines  Anesthesia: General with LMA  Complications: None  Specimen: Bladder tumor fragments, to pathology  Drains: 27 French Foley catheter to bedside drainage  Estimated blood loss: Less than 5 mL  Indications: 69 year old male with persistent gross hematuria over the past several weeks.  Recent cystoscopy revealed gross hematuria and fibrinous tissue at the bladder neck as well as possible telangiectatic vessels in the lower bladder region.  He presents at this time, because of his persistent blood loss, for cauterization and possible resection.  I discussed the procedure with the patient and his sister, [name] who is power of attorney.  They understand the procedure as well as risks and complications and desire to proceed.  Findings: Solitary papillary lesion in the right supra trigonal region, as well as a necrotic, nodular lesion at the right bladder neck approximately 3 cm in diameter.  There was some dystrophic calcifications on the surface of this.  The remaining urothelium of the bladder was normal.  Prostate was nonobstructive.  Description of procedure: The patient was properly identified in the holding area and received IV antibiotics.  Was taken to the operating room where general anesthetic was administered.  [name] placed in the dorsolithotomy position.  Genitalia and perineum were prepped and draped.  Proper timeout was performed.  Resectoscope sheath was placed using the visual obturator with the above-mentioned findings noted.  The resectoscope and cutting loop were placed.  Using  monopolar energy, the small lesion was resected and the base of this cauterized.  Additionally, the right bladder neck was resected down to muscular tissue.  This was a very firm, hard tissue with dystrophic calcifications and was somewhat hard to resect.  However, I did this with care, taking care to avoid injuring the right ureteral orifice.  Resection was taken into the right prostatic lobe.  Following this, the resected tissue was rinsed from the bladder.  The base of the tumor was cauterized with no significant bleeding seen at this point.  The tissue was sent for pathology labeled bladder tumor.  At this point there being no further bleeding, the scope was removed, a 20 [place] Foley catheter was placed and hooked to a bedside bag.  The procedure was terminated at this point.  The patient was awakened and taken to the PACU in stable condition.  He tolerated procedure well.  Due to the patient's significant anemia, he will be transfused with 2 units of packed red cells and admitted for overnight monitoring.

## 2019-06-16 NOTE — Anesthesia Procedure Notes (Signed)
Procedure Name: LMA Insertion Date/Time: 06/16/2019 10:30 AM Performed by: Eben Burow, CRNA Pre-anesthesia Checklist: Patient identified, Emergency Drugs available, Suction available, Patient being monitored and Timeout performed Patient Re-evaluated:Patient Re-evaluated prior to induction Oxygen Delivery Method: Circle system utilized Preoxygenation: Pre-oxygenation with 100% oxygen Induction Type: IV induction Ventilation: Mask ventilation without difficulty LMA: LMA inserted LMA Size: 4.0 Number of attempts: 1 Tube secured with: Tape Dental Injury: Teeth and Oropharynx as per pre-operative assessment

## 2019-06-16 NOTE — H&P (Signed)
H&P  Chief Complaint: Gross hematuria  History of Present Illness: 69 year old male status post radiotherapy for adenocarcinoma the prostate.  He has also had TURP for BPH with obstruction n.  He has had recurrent/persistent gr as well as TURBT for bladder cancer.oss hematuria.  [name] also had multiple medical issues.  Both of these have probably contributed to the patient's weakness.  Because of his hematuria and cystoscopic evidence of bleeding from his bladder/prostatic urethra, he presents at this time for definitive management with cauterization.  Past Medical History:  Diagnosis Date  . Anticoagulant long-term use    eliquis  . Bladder tumor   . BPH (benign prostatic hyperplasia)   . CKD (chronic kidney disease), stage II   . Diverticulosis   . Dyslipidemia   . History of CVA with residual deficit 01/28/2017   acute right MCA infarc with M2 embolic occlusion w/p  tPA and mechnical thrombectomy---  residual left side weakness  . History of external beam radiation therapy    03-23-2018  to 05-03-2018  prostate cancer  . Hypertension   . Hypertensive heart disease   . Hypertrophic cardiomegaly, apical variant   . Hypertrophic obstructive cardiomyopathy with diastolic heart failure [facility])    cardiologist-- dr Stanford Breed  . Mitral regurgitation   . Other problems related to education and literacy    can not read  . Prostate cancer [facility]) urologist-  dr Kevan Prouty/  oncologsit-- dr Tammi Klippel   dx 07/ 2018---  Stage T1a,  Gleason 4+3--- completed external radiation therapy 05-03-2018  . Stroke [facility]) 2019    Past Surgical History:  Procedure Laterality Date  . 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 CARDIOVASCULAR STRESS TEST  11-19-2016  dr Stanford Breed   Low risk nuclear study/  normal resting and stress perfusion with no ischemia or infarction/  EF not done due to PVCIs , TID borderline, 1.2 significant LVH  . GOLD SEED IMPLANT N/A 03/10/2018   Procedure: GOLD SEED IMPLANT;  Surgeon: Franchot Gallo, MD;  Location: [facility];  Service: Urology;  Laterality: N/A;  . IR PERCUTANEOUS ART THROMBECTOMY/INFUSION INTRACRANIAL INC DIAG ANGIO  01/23/2017  . RADIOLOGY WITH ANESTHESIA N/A 01/23/2017   Procedure: RADIOLOGY WITH ANESTHESIA;  Surgeon: Luanne Bras, MD;  Location: [place];  Service: Radiology;  Laterality: N/A;  . SPACE OAR INSTILLATION N/A 03/10/2018   Procedure: SPACE OAR INSTILLATION;  Surgeon: Franchot Gallo, MD;  Location: [facility];  Service: Urology;  Laterality: N/A;  . TRANSTHORACIC ECHOCARDIOGRAM  11-20-2016   dr Stanford Breed   mild LVH,  apical hypertrophic cardiomyopathy, systolic function vigorous,  ef 65-70%,  grade 2 diastolic dysfunction/  mild AR, MR, and  PR/  moderate LAE and RAE/  mild to mod. TR/  moderate increase PASP 82mmHg  . TRANSURETHRAL RESECTION OF BLADDER TUMOR WITH MITOMYCIN-C N/A 12/09/2018   Procedure: TRANSURETHRAL RESECTION OF BLADDER TUMOR WITH GEMCITABINE GIVEN IN PACU;  Surgeon: Franchot Gallo, MD;  Location: [facility];  Service: Urology;  Laterality: N/A;  . TRANSURETHRAL RESECTION OF PROSTATE N/A 11/24/2016   Procedure: TRANSURETHRAL RESECTION OF THE PROSTATE (TURP);  Surgeon: Franchot Gallo, MD;  Location: [facility];  Service: Urology;  Laterality: N/A;    Home Medications:    Allergies:  Allergies  Allergen Reactions  . Feraheme [Ferumoxytol] Anaphylaxis    Family History  Problem Relation Age of Onset  . Cancer Paternal Aunt  unknown    Social History:  reports that he has been smoking cigarettes. He has a 25.50 pack-year smoking history. He has never used smokeless tobacco. He reports that he does not drink alcohol or use drugs.  ROS: A complete review of systems was performed.  All systems are negative except for pertinent findings as noted.  Physical Exam:  Vital signs in last 24 hours: BP  (!) 144/81   Pulse 60   Temp 97.8 F (36.6 C) (Oral)   Resp (!) 25  Constitutional:  Alert and oriented, No acute distress Cardiovascular: Regular rate  Respiratory: Normal respiratory effort GI: Abdomen is soft, nontender, nondistended, no abdominal masses. No CVAT.  Genitourinary: Normal male phallus, testes are descended bilaterally and non-tender and without masses, scrotum is normal in appearance without lesions or masses, perineum is normal on inspection. Lymphatic: No lymphadenopathy Neurologic: Grossly intact, no focal deficits Psychiatric: Normal mood and affect  Laboratory Data:  No results for input(s): WBC, HGB, HCT, PLT in the last 72 hours.  No results for input(s): NA, K, CL, GLUCOSE, BUN, CALCIUM, CREATININE in the last 72 hours.  Invalid input(s): CO3   No results found for this or any previous visit (from the past 24 hour(s)). Recent Results (from the past 240 hour(s))  SARS CORONAVIRUS 2 (TAT 6-24 HRS) Nasopharyngeal Nasopharyngeal Swab     Status: None   Collection Time: 06/14/19  9:34 AM   Specimen: Nasopharyngeal Swab  Result Value Ref Range Status   SARS Coronavirus 2 NEGATIVE NEGATIVE Final    Comment: (NOTE) SARS-CoV-2 target nucleic acids are NOT DETECTED. The SARS-CoV-2 RNA is generally detectable in upper and lower respiratory specimens during the acute phase of infection. Negative results do not preclude SARS-CoV-2 infection, do not rule out co-infections with other pathogens, and should not be used as the sole basis for treatment or other patient management decisions. Negative results must be combined with clinical observations, patient history, and epidemiological information. The expected result is Negative. Fact Sheet for Patients: SugarRoll.be Fact Sheet for Healthcare Providers: https://www.woods-mathews.com/ This test is not yet approved or cleared by the [place] FDA and  has been authorized  for detection and/or diagnosis of SARS-CoV-2 by FDA under an Emergency Use Authorization (EUA). This EUA will remain  in effect (meaning this test can be used) for the duration of the COVID-19 declaration under Section 56 4(b)(1) of the Act, 21 U.S.C. section 360bbb-3(b)(1), unless the authorization is terminated or revoked sooner. Performed at [facility], [place] [address], [place],  [address]     Renal Function: No results for input(s): CREATININE in the last 168 hours. CrCl cannot be calculated (Patient's most recent lab result is older than the maximum 21 days allowed.).  Radiologic Imaging: No results found.  Impression/Assessment:  Gross hematuria, related to radiation-induced cystitis  Plan:  Cystoscopy, cauterization of bleeders

## 2019-06-16 NOTE — Transfer of Care (Signed)
Immediate Anesthesia Transfer of Care Note  Patient: Ian Hill  Procedure(s) Performed: TRANSURETHERAL RESECTION OF BLADDER TUMOR. (N/A )  Patient Location: PACU  Anesthesia Type:General  Level of Consciousness: awake, alert  and confused  Airway & Oxygen Therapy: Patient Spontanous Breathing and Patient connected to face mask oxygen  Post-op Assessment: Report given to RN and Post -op Vital signs reviewed and stable  Post vital signs: Reviewed and stable  Last Vitals:  Vitals Value Taken Time  BP 135/84 06/16/19 1109  Temp    Pulse    Resp 16 06/16/19 1115  SpO2    Vitals shown include unvalidated device data.  Last Pain:  Vitals:   06/16/19 0802  TempSrc: Oral         Complications: No apparent anesthesia complications

## 2019-06-16 NOTE — Anesthesia Preprocedure Evaluation (Addendum)
Anesthesia Evaluation  Patient identified by MRN, date of birth, ID band Patient awake    Reviewed: Allergy & Precautions, NPO status , Patient's Chart, lab work & pertinent test results  Airway Mallampati: II  TM Distance: >3 FB Neck ROM: Full    Dental  (+) Teeth Intact, Dental Advisory Given   Pulmonary Current Smoker and Patient abstained from smoking.,    Pulmonary exam normal breath sounds clear to auscultation       Cardiovascular hypertension, (-) angina(-) Past MI + dysrhythmias Atrial Fibrillation + Valvular Problems/Murmurs MR  Rhythm:Irregular Rate:Abnormal     Neuro/Psych CVA, Residual Symptoms    GI/Hepatic   Endo/Other    Renal/GU Renal InsufficiencyRenal disease   HEMATURIA Bladder tumor    Musculoskeletal   Abdominal   Peds  Hematology  (+) Blood dyscrasia (Eliquis), anemia ,   Anesthesia Other Findings   Reproductive/Obstetrics                            Anesthesia Physical Anesthesia Plan  ASA: IV  Anesthesia Plan: General   Post-op Pain Management:    Induction: Intravenous  PONV Risk Score and Plan: 2 and Dexamethasone and Ondansetron  Airway Management Planned: LMA  Additional Equipment:   Intra-op Plan:   Post-operative Plan: Extubation in OR  Informed Consent: I have reviewed the patients History and Physical, chart, labs and discussed the procedure including the risks, benefits and alternatives for the proposed anesthesia with the patient or authorized representative who has indicated his/her understanding and acceptance.     Dental advisory given  Plan Discussed with: CRNA  Anesthesia Plan Comments:         Anesthesia Quick Evaluation

## 2019-06-17 ENCOUNTER — Other Ambulatory Visit: Payer: Self-pay

## 2019-06-17 DIAGNOSIS — C678 Malignant neoplasm of overlapping sites of bladder: Secondary | ICD-10-CM | POA: Diagnosis not present

## 2019-06-17 DIAGNOSIS — I480 Paroxysmal atrial fibrillation: Secondary | ICD-10-CM | POA: Diagnosis not present

## 2019-06-17 DIAGNOSIS — D62 Acute posthemorrhagic anemia: Secondary | ICD-10-CM | POA: Diagnosis not present

## 2019-06-17 DIAGNOSIS — E785 Hyperlipidemia, unspecified: Secondary | ICD-10-CM | POA: Diagnosis not present

## 2019-06-17 LAB — TYPE AND SCREEN
ABO/RH(D): A POS
Antibody Screen: NEGATIVE
Unit division: 0
Unit division: 0

## 2019-06-17 LAB — CBC
HCT: 28 % — ABNORMAL LOW (ref 39.0–52.0)
Hemoglobin: 9 g/dL — ABNORMAL LOW (ref 13.0–17.0)
MCH: 28 pg (ref 26.0–34.0)
MCHC: 32.1 g/dL (ref 30.0–36.0)
MCV: 87 fL (ref 80.0–100.0)
Platelets: 237 10*3/uL (ref 150–400)
RBC: 3.22 MIL/uL — ABNORMAL LOW (ref 4.22–5.81)
RDW: 17 % — ABNORMAL HIGH (ref 11.5–15.5)
WBC: 9.4 10*3/uL (ref 4.0–10.5)
nRBC: 3.2 % — ABNORMAL HIGH (ref 0.0–0.2)

## 2019-06-17 LAB — BPAM RBC
Blood Product Expiration Date: 202102242359
Blood Product Expiration Date: 202102242359
ISSUE DATE / TIME: 202102041044
ISSUE DATE / TIME: 202102041409
Unit Type and Rh: 6200
Unit Type and Rh: 6200

## 2019-06-17 MED ORDER — PRO-STAT SUGAR FREE PO LIQD
30.0000 mL | Freq: Two times a day (BID) | ORAL | Status: DC
  Administered 2019-06-17 – 2019-06-18 (×2): 30 mL via ORAL
  Filled 2019-06-17 (×2): qty 30

## 2019-06-17 MED ORDER — BISACODYL 5 MG PO TBEC: 5.0000 mg | DELAYED_RELEASE_TABLET | Freq: Every day | ORAL | Status: DC | PRN

## 2019-06-17 MED ORDER — ENSURE ENLIVE PO LIQD
237.0000 mL | Freq: Two times a day (BID) | ORAL | Status: DC
  Administered 2019-06-17 – 2019-06-18 (×2): 237 mL via ORAL

## 2019-06-17 MED ORDER — ADULT MULTIVITAMIN W/MINERALS CH
1.0000 | ORAL_TABLET | Freq: Every day | ORAL | Status: DC
  Administered 2019-06-17 – 2019-06-18 (×2): 1 via ORAL
  Filled 2019-06-17 (×2): qty 1

## 2019-06-17 NOTE — Discharge Instructions (Signed)
1. You may see some blood in the urine and may have some burning with urination for 48-72 hours. You also may notice that you have to urinate more frequently or urgently after your procedure which is normal.  2. You should call should you develop an inability urinate, fever > 101, persistent nausea and vomiting that prevents you from eating or drinking to stay hydrated.  3. If you have a stent, you will likely urinate more frequently and urgently until the stent is removed and you may experience some discomfort/pain in the lower abdomen and flank especially when urinating. You may take pain medication prescribed to you if needed for pain. You may also intermittently have blood in the urine until the stent is removed. 4. If you have a catheter, you will be taught how to take care of the catheter by the nursing staff prior to discharge from the hospital.  You may periodically feel a strong urge to void with the catheter in place.  This is a bladder spasm and most often can occur when having a bowel movement or moving around. It is typically self-limited and usually will stop after a few minutes.  You may use some Vaseline or Neosporin around the tip of the catheter to reduce friction at the tip of the penis. You may also see some blood in the urine.  A very small amount of blood can make the urine look quite red.  As long as the catheter is draining well, there usually is not a problem.  However, if the catheter is not draining well and is bloody, you should call the office [phone]) to notify us.  Thanks for your help.

## 2019-06-17 NOTE — Progress Notes (Signed)
1 Day Post-Op Subjective: Patient reports "weak stomach" feels as if he needs to have BM. No pain. No catheter related issues  Objective: Vital signs in last 24 hours: Temp:  [97.2 F (36.2 C)-98 F (36.7 C)] 97.8 F (36.6 C) (02/05 0621) Pulse Rate:  [60-94] 75 (02/05 0621) Resp:  [13-27] 17 (02/05 0621) BP: (125-161)/(68-112) 125/75 (02/05 0621) SpO2:  [90 %-100 %] 100 % (02/05 0621) Weight:  [53.1 kg] 53.1 kg (02/04 2105)  Intake/Output from previous day: 02/04 0701 - 02/05 0700 In: 2991.2 [P.O.:1240; I.V.:709.2; Blood:692; IV Piggyback:350] Out: 1100 [Urine:1050; Blood:50] Intake/Output this shift: No intake/output data recorded.  Physical Exam:  Constitutional: Vital signs reviewed. WD WN in NAD   Eyes: PERRL, No scleral icterus.   Cardiovascular: RRR Pulmonary/Chest: Normal effort Extremities: No cyanosis or edema   Lab Results: Recent Labs    06/16/19 0850  HGB 7.2*  HCT 23.6*   BMET Recent Labs    06/16/19 0850  NA 139  K 5.1  CL 107  CO2 25  GLUCOSE 106*  BUN 23  CREATININE 1.37*  CALCIUM 9.3   No results for input(s): LABPT, INR in the last 72 hours. No results for input(s): LABURIN in the last 72 hours. Results for orders placed or performed during the hospital encounter of 06/14/19  SARS CORONAVIRUS 2 (TAT 6-24 HRS) Nasopharyngeal Nasopharyngeal Swab     Status: None   Collection Time: 06/14/19  9:34 AM   Specimen: Nasopharyngeal Swab  Result Value Ref Range Status   SARS Coronavirus 2 NEGATIVE NEGATIVE Final    Comment: (NOTE) SARS-CoV-2 target nucleic acids are NOT DETECTED. The SARS-CoV-2 RNA is generally detectable in upper and lower respiratory specimens during the acute phase of infection. Negative results do not preclude SARS-CoV-2 infection, do not rule out co-infections with other pathogens, and should not be used as the sole basis for treatment or other patient management decisions. Negative results must be combined with clinical  observations, patient history, and epidemiological information. The expected result is Negative. Fact Sheet for Patients: SugarRoll.be Fact Sheet for Healthcare Providers: https://www.woods-mathews.com/ This test is not yet approved or cleared by the [place] FDA and  has been authorized for detection and/or diagnosis of SARS-CoV-2 by FDA under an Emergency Use Authorization (EUA). This EUA will remain  in effect (meaning this test can be used) for the duration of the COVID-19 declaration under Section 56 4(b)(1) of the Act, 21 U.S.C. section 360bbb-3(b)(1), unless the authorization is terminated or revoked sooner. Performed at [facility], [place] [address], [place], [place] [address]     Studies/Results: CT ABDOMEN PELVIS W WO CONTRAST  Result Date: 06/16/2019 CLINICAL DATA:  History of urologic cancer. Evaluate treatment response. EXAM: CT ABDOMEN AND PELVIS WITHOUT AND WITH CONTRAST TECHNIQUE: Multidetector CT imaging of the abdomen and pelvis was performed following the standard protocol before and following the bolus administration of intravenous contrast. CONTRAST:  52mL OMNIPAQUE IOHEXOL 300 MG/ML  SOLN COMPARISON:  11/13/2017 FINDINGS: Lower chest: Unchanged appearance of bilateral lower lobe scarring. No acute abnormality noted. Hepatobiliary: There is a small low-density structure in the inferior right hepatic lobe measuring 5 mm, image 32/4 this was likely present on exam from 02/20/2017 and is favored to represent a benign abnormality. No additional focal liver abnormalities identified. Gallbladder negative. No biliary dilatation. Pancreas: Unremarkable. No pancreatic ductal dilatation or surrounding inflammatory changes. Spleen: Normal in size without focal abnormality. Adrenals/Urinary Tract: Normal appearance of the adrenal glands. Small hypoechoic structure  arising from medial cortex of right kidney measures 7 mm and is unchanged  from previous exam favoring a benign abnormality. Focal wedge-shaped area of scarring is noted within the posterior lateral right mid kidney, image 18/4. There is a stable low-density structure arising from upper pole of the right kidney measuring 1.2 by 0.7 cm, image 45/10. 5 mm low-density structures noted within the inferior pole of the left kidney, unchanged from previous exam. 8 mm exophytic low-density structure arising from anterolateral cortex of the upper pole of left kidney is unchanged. Findings are favored to represent small cysts. Thick walled urinary bladder is partially collapsed around a Foley catheter balloon. Unfortunately, the Foley catheter does not appear to be clamped and the excreted contrast material opacifying the bladder can be seen draining through the catheter. As a result there is incomplete distension of the urinary bladder and therefore incomplete visualization of underlying urothelial neoplasm. This is compounded by patient cachexia with decreased perivesicular fat. Within this limitation the partially collapsed bladder has a wall thickness measuring 1.1 cm. A calcification is noted within the right posterior bladder measuring 0.6 cm. Within the posterior bladder there is a filling defect which is partially surrounded by locules of gas and excreted contrast material measuring 2.4 by 1.3 by 4.6 cm, image 57/17 and image 29/16. Stomach/Bowel: Stomach is within normal limits. Appendix appears normal. No evidence of bowel wall thickening, distention, or inflammatory changes. Vascular/Lymphatic: Aortic atherosclerosis. No aneurysm. No abdominal adenopathy. Reproductive: Enlarged prostate gland contains several metallic fiducial markers. Other: Small volume of free fluid noted within the pelvis. Musculoskeletal: No acute or significant osseous findings. IMPRESSION: 1. Unfortunately, the Foley catheter does not appear to be clamped and the excreted contrast material can be seen draining  through the catheter. As a result there is incomplete distension of the urinary bladder and therefore incomplete visualization of underlying urothelial within the diffusely thick walled urinary bladder there is a filling defect within the posterior bladder which is partially surrounded by foci gas and excreted contrast material. Findings may represent recurrent urothelial neoplasm and or blood clot. 2. No convincing evidence for nodal metastasis or solid organ metastasis within the abdomen or pelvis. Cachexia and diminished peri fascicular fat within the pelvis diminishes sensitivity for detecting extra vesicular extension of disease. No obvious tumor invasion outside of the bladder noted. 3. Prostate gland enlargement. 4. Small volume of free fluid noted within the pelvis. Aortic Atherosclerosis (ICD10-I70.0). Electronically Signed   By: Kerby Moors M.D.   On: 06/16/2019 19:51   DG Chest 2 View  Result Date: 06/16/2019 CLINICAL DATA:  Patient with history of bladder and prostate cancer. EXAM: CHEST - 2 VIEW COMPARISON:  Chest radiograph 03/12/2019. FINDINGS: Stable enlarged cardiac and mediastinal contours. Postsurgical changes left hemithorax. Interval decrease in right greater than left mid and lower lung opacities. Probable small right pleural effusion. No pneumothorax. IMPRESSION: Minimal heterogeneous opacities right lung base may represent atelectasis or infection. Probable small right pleural effusion. Electronically Signed   By: Lovey Newcomer M.D.   On: 06/16/2019 19:32    Assessment/Plan:   POD 1 TURBT.. urine looks clear. Will d/c catheter. Appreciate Dr Lisbeth Ply assistance  PT consult  Laxative   From GU standpoint OK for D/c once cleared medically  I spoke w/ sister Gregary Signs   LOS: 0 days   Jorja Loa 06/17/2019, 7:42 AM

## 2019-06-17 NOTE — Progress Notes (Addendum)
Patient voided 50 cc. Bladder scan completed, 74 cc.  Pt encouraged to have some fluids PO. Will continue to monitor.

## 2019-06-17 NOTE — Progress Notes (Signed)
Initial Nutrition Assessment  DOCUMENTATION CODES:   Not applicable  INTERVENTION:  - will order Ensure Enlive BID, each supplement provides 350 kcal and 20 grams of protein. - will order Magic Cup with lunch meals, each supplement provides 290 kcal and 9 grams of protein. - will order 30 mL Prostat BID, each supplement provides 100 kcal and 15 grams of protein. - will order daily multivitamin with minerals.  - continue to encourage PO intakes.    NUTRITION DIAGNOSIS:   Increased nutrient needs related to acute illness, chronic illness, cancer and cancer related treatments, post-op healing as evidenced by estimated needs.  GOAL:   Patient will meet greater than or equal to 90% of their needs  MONITOR:   PO intake, Supplement acceptance, Labs, Weight trends  REASON FOR ASSESSMENT:   Malnutrition Screening Tool  ASSESSMENT:   69 y.o. male with medical history of bladder cancer, prostate cancer, stage 2 CKD, diverticulosis, hyperlipidemia, CVA, HTN, and hypertrophic cardiomegaly. Patient presented to the hospital on 2/4 for planned TURBT by Urology.  Per flow sheet documentation, he consumed 25% of dinner on 2/4 (190 kcal, 10 grams protein) and 100% of breakfast this AM (892 kcal, 33 grams protein). Unable to talk with patient at time of attempted visit earlier this AM. Patient has not been seen by a [place] since 2019.  Patient is POD #1 TURBT. Notes state patient is stable for d/c.   Per chart review, weight on 2/4 was 117 lb and weight has been fairly stable with some slight fluctuation since 11/2017.    Labs reviewed; creatinine: 1.37 mg/dl, GFR: 52 ml/min. Medications reviewed.     NUTRITION - FOCUSED PHYSICAL EXAM:  unable to complete at this time.   Diet Order:   Diet Order            Diet regular Room service appropriate? Yes; Fluid consistency: Thin  Diet effective now              EDUCATION NEEDS:   No education needs have been identified at  this time  Skin:  Skin Assessment: Skin Integrity Issues: Skin Integrity Issues:: Incisions Incisions: penis (2/4)  Last BM:  PTA/unknown  Height:   Ht Readings from Last 1 Encounters:  06/16/19 5\' 6"  (1.676 m)    Weight:   Wt Readings from Last 1 Encounters:  06/16/19 53.1 kg    Ideal Body Weight:  64.5 kg  BMI:  Body mass index is 18.88 kg/m.  Estimated Nutritional Needs:   Kcal:  [id number] kcal  Protein:  100-115 grams  Fluid:  >/= 2.1 L/day     Jarome Matin, MS, RD, LDN, CNSC Inpatient Clinical Dietitian RD pager # available in [facility]  After hours/weekend pager # available in [facility]

## 2019-06-17 NOTE — TOC Initial Note (Signed)
Transition of Care [facility]) - Initial/Assessment Note    Patient Details  Name: Ian Hill MRN: [id number] Date of Birth: 10-14-1950  Transition of Care [facility]) CM/SW Contact:    Dessa Phi, RN Phone Number: 06/17/2019, 3:29 PM  Clinical Narrative: [facility] provide their own HHPT. No further CM needs.                  Expected Discharge Plan: [place] Barriers to Discharge: Continued Medical Work up   Patient Goals and CMS Choice        Expected Discharge Plan and Services Expected Discharge Plan: [place]   Discharge Planning Services: CM Consult   Living arrangements for the past 2 months: Single Family Home                           HH Arranged: PT [place] Agency: Other - See comment([facility] provides their own HHPT)        Prior Living Arrangements/Services Living arrangements for the past 2 months: Single Family Home Lives with:: Siblings Patient language and need for interpreter reviewed:: Yes Do you feel safe going back to the place where you live?: Yes      Need for Family Participation in Patient Care: No (Comment) Care giver support system in place?: Yes (comment)   Criminal Activity/Legal Involvement Pertinent to Current Situation/Hospitalization: No - Comment as needed  Activities of Daily Living Home Assistive Devices/Equipment: Dentures (specify type), Cane (specify quad or straight) ADL Screening (condition at time of admission) Patient's cognitive ability adequate to safely complete daily activities?: Yes Is the patient deaf or have difficulty hearing?: No Does the patient have difficulty seeing, even when wearing glasses/contacts?: No Does the patient have difficulty concentrating, remembering, or making decisions?: No Patient able to express need for assistance with ADLs?: Yes Does the patient have difficulty dressing or bathing?: Yes Independently performs ADLs?: No Communication:  Independent Dressing (OT): Needs assistance Is this a change from baseline?: Change from baseline, expected to last >3 days Grooming: Needs assistance Is this a change from baseline?: Change from baseline, expected to last >3 days Feeding: Independent Bathing: Needs assistance Is this a change from baseline?: Pre-admission baseline Toileting: Independent In/Out Bed: Independent Walks in Home: Independent with device (comment) Does the patient have difficulty walking or climbing stairs?: Yes Weakness of Legs: Both Weakness of Arms/Hands: None  Permission Sought/Granted Permission sought to share information with : Case Manager Permission granted to share information with : Yes, Verbal Permission Granted  Share Information with NAME: Case Manager     Permission granted to share info w Relationship: [name] sister [id number]     Emotional Assessment Appearance:: Appears stated age Attitude/Demeanor/Rapport: Gracious Affect (typically observed): Accepting Orientation: : Oriented to Self, Oriented to Place, Oriented to  Time Alcohol / Substance Use: Not Applicable Psych Involvement: No (comment)  Admission diagnosis:  Malignant neoplasm of overlapping sites of bladder [facility]) [C67.8] Patient Active Problem List   Diagnosis Date Noted  . Malignant neoplasm of overlapping sites of bladder ([place]) 06/16/2019  . Anaphylaxis 03/08/2019  . Hematuria 03/03/2019  . History of CVA (cerebrovascular accident) 03/03/2019  . Mitral regurgitation 03/03/2019  . E coli bacteremia   . E coli enteritis   . Emesis 11/13/2017  . Nausea vomiting and diarrhea   . Viral gastroenteritis   . Permanent atrial fibrillation ([place])   . Bronchiectasis without  complication ([place])   . Hypertrophic cardiomegaly, apical variant   . Smoker 03/25/2017  . Chronic anticoagulation 03/25/2017  . Chronic atrial fibrillation ([place]) 03/25/2017  . Prostate cancer ([place]) 02/26/2017  . Cerebrovascular accident (CVA)  due to embolic occlusion of right middle cerebral artery ([place]) 02/25/2017  . Left-sided neglect 01/30/2017  . Gait disturbance, post-stroke 01/30/2017  . Left hemiparesis ([place])   . PAF (paroxysmal atrial fibrillation) ([place])   . Benign prostatic hyperplasia   . Hyperlipidemia   . Thrombocytopenia ([place])   . Protein-calorie malnutrition, severe 01/24/2017  . Acute right arterial ischemic stroke, middle cerebral artery (MCA) ([place]) 01/23/2017  . Acute respiratory failure ([place]) 01/23/2017  . Essential hypertension 01/23/2017  . Acute metabolic encephalopathy [id number]  . Endotracheal tube present   . Cerebrovascular accident (CVA) due to occlusion of right middle cerebral artery ([place]) - Right middle cerebral artery branch infarct do to right M1 occlusion treated with IV TPA and mechanical embolect   . Stage 3 chronic kidney disease   . Enlarged prostate with urinary retention 11/24/2016  . Lung nodule 09/16/2016  . Volume overload 09/10/2016  . Urinary retention 09/10/2016  . BPH (benign prostatic hyperplasia) 09/01/2016  . Periumbilical abdominal pain 09/01/2016  . Cerumen impaction 02/16/2016  . Decreased dorsalis pedis pulse 02/16/2016  . CKD (chronic kidney disease), stage II 02/20/2012  . Preventative health care 06/26/2010  . Other symptoms involving cardiovascular system 11/19/2009  . Hypertrophic obstructive cardiomyopathy ([place]) 10/10/2009  . Hyperlipidemia 04/30/2006  . TOBACCO ABUSE 04/30/2006  . Essential hypertension 04/30/2006  . ELEVATED PROSTATE SPECIFIC ANTIGEN 04/30/2006  . DIVERTICULOSIS, COLON 12/18/2005   PCP:  Linda Hedges, NP Pharmacy:   [facility], [place] [address] [place] [address] Phone: [phone] Fax: [phone]  [id number] - [place], [address] [place] [address] Phone: [phone] Fax:  [phone]     Social Determinants of Health (SDOH) Interventions    Readmission Risk Interventions No flowsheet data found.

## 2019-06-17 NOTE — Progress Notes (Signed)
Call received from [place] [facility], [place]. If patient is  discharged transportation is available 10 am-430 pm, earlier the better within time frame. Contact  [phone] [place].  For On-call [phone], choose on-call selection for Medical providers or for insurance authorization.

## 2019-06-17 NOTE — Progress Notes (Addendum)
Second bladder scan completed 100 cc. Pt denies discomfort and or pain at this time. Will continue to monitor.

## 2019-06-17 NOTE — Progress Notes (Signed)
PROGRESS NOTE    Ian Hill  [id number] DOB: 05/09/1951 DOA: 06/16/2019 PCP: Linda Hedges, NP   Brief Narrative: Ian Hill is a 69 y.o. male with medical history significant of bladder cancer, prostate cancer, CKD stage II, diverticulosis, hyperlipidemia, CVA, hypertension, hypertrophic cardiomegaly.  Patient presented to the hospital for planned TURBT performed by urology.   Assessment & Plan:   Active Problems:   Malignant neoplasm of overlapping sites of bladder ([place])   Acute on chronic blood loss anemia Gross hematuria Baseline hemoglobin appears to be around 8 g/dL.  Hemoglobin prior to procedure of 7.2.  Patient received 2 units of PRBC per primary team. Post transfusion hemoglobin of 9. Hematuria appears to be stopped.  Rales Patient received 700 mL of lactated Ringer's, 250 mL of 5% albumin.  IV fluids while in the OR. Currently on IV fluids.  Patient without respiratory symptoms.  No history of heart failure with last EF of 60 to 65% although there was mention of ventricular hypertrophy. Chest x-ray was significant for possible atelectasis vs infection. No clinical signs of infection.  -Will recommend incentive spirometry.  Weakness Patient describes his legs giving out. Per sister, he has had issues with his leg giving out for the last three weeks; he has not seen his primary physician. Falls are not associated with dyspnea, chest pain, prodrome. Weakness possibly exacerbated by anemia. Agree with PT consult which is still pending. No focal neurologic symptoms concerning for acute stroke or other CNS pathology. -PT eval/recommendations prior to discharge -PCP follow-up for continued workup  Paroxysmal atrial fibrillation/flutter Patient is currently rate controlled.  Appears to be in sinus rhythm at this time.   Patient is on digoxin and Eliquis as an outpatient. Avoid AV nodal blocking agents. -Continue digoxin -Holding Eliquis secondary to gross  hematuria and anemia; restart per urology recommendations in setting of hematuria  Hyperlipidemia -Continue Crestor 10 mg  History of intermittent complete heart block History of sinus pauses PPM discussed and declined per family discussion per chart review. In the setting of atenolol and shock from feraheme infusion. Atenolol previously discontinued.  CKD stage II Creatinine baseline and stable.  History of prostate and bladder cancer Patient is  status post external beam radiotherapy and has a history of TURBT. TURBT performed today revealing recurrence of 3 cm urothelial carcinoma bladder neck in addition to satellite lesion in the superior trigonal region measuring 1 cm.  Patient has Foley catheter placed. -Per primary -Flomax, Foley, Bactrim  Severe tricuspid valve regurgitation Biatrial enlargement Noted on Transthoracic Echocardiogram from 02/2019  Cognitive deficits Appears baseline.  History of CVA -Continue Crestor   DVT prophylaxis: Per primary Code Status:   Code Status: Full Code Family Communication: Sister on telephone Disposition Plan: Per primary. Patient is stable for outpatient management from a medical standpoint. Would recommend waiting for PT evaluation for safety and follow-up with PCP for weakness vs continued PT as an outpatient (depending on PT recommendations).   Subjective: Feels weak but better than yesterday since eating food.  Objective: Vitals:   06/16/19 2105 06/17/19 0033 06/17/19 0621 06/17/19 0937  BP:  134/79 125/75 121/79  Pulse:  62 75 84  Resp:  18 17 16   Temp:  (!) 97.4 F (36.3 C) 97.8 F (36.6 C)   TempSrc:  Oral Oral   SpO2:  100% 100% 100%  Weight: 53.1 kg     Height: 5\' 6"  (1.676 m)       Intake/Output Summary (Last 24 hours)  at 06/17/2019 1105 Last data filed at 06/17/2019 0947 Gross per 24 hour  Intake 2981.17 ml  Output 1150 ml  Net 1831.17 ml   Filed Weights   06/16/19 2105  Weight: 53.1 kg     Examination:  General exam: Appears calm and comfortable Respiratory system: Mild rales at bases. Respiratory effort normal. Cardiovascular system: S1 & S2 heard, RRR. No murmurs, rubs, gallops or clicks. Gastrointestinal system: Abdomen is nondistended, soft and nontender. No organomegaly or masses felt. Normal bowel sounds heard. Central nervous system: Alert and oriented. No focal neurological deficits. Extremities: No edema. No calf tenderness Skin: No cyanosis. No rashes Psychiatry: Mood & affect appropriate.     Data Reviewed: I have personally reviewed following labs and imaging studies  CBC: Recent Labs  Lab 06/16/19 0850 06/17/19 0811  WBC 4.2 9.4  HGB 7.2* 9.0*  HCT 23.6* 28.0*  MCV 89.4 87.0  PLT 279 [id number]   Basic Metabolic Panel: Recent Labs  Lab 06/16/19 0850  NA 139  K 5.1  CL 107  CO2 25  GLUCOSE 106*  BUN 23  CREATININE 1.37*  CALCIUM 9.3   GFR: Estimated Creatinine Clearance: 37.7 mL/min (A) (by C-G formula based on SCr of 1.37 mg/dL (H)). Liver Function Tests: No results for input(s): AST, ALT, ALKPHOS, BILITOT, PROT, ALBUMIN in the last 168 hours. No results for input(s): LIPASE, AMYLASE in the last 168 hours. No results for input(s): AMMONIA in the last 168 hours. Coagulation Profile: No results for input(s): INR, PROTIME in the last 168 hours. Cardiac Enzymes: No results for input(s): CKTOTAL, CKMB, CKMBINDEX, TROPONINI in the last 168 hours. BNP (last 3 results) No results for input(s): PROBNP in the last 8760 hours. HbA1C: No results for input(s): HGBA1C in the last 72 hours. CBG: No results for input(s): GLUCAP in the last 168 hours. Lipid Profile: No results for input(s): CHOL, HDL, LDLCALC, TRIG, CHOLHDL, LDLDIRECT in the last 72 hours. Thyroid Function Tests: No results for input(s): TSH, T4TOTAL, FREET4, T3FREE, THYROIDAB in the last 72 hours. Anemia Panel: No results for input(s): VITAMINB12, FOLATE, FERRITIN, TIBC, IRON,  RETICCTPCT in the last 72 hours. Sepsis Labs: No results for input(s): PROCALCITON, LATICACIDVEN in the last 168 hours.  Recent Results (from the past 240 hour(s))  SARS CORONAVIRUS 2 (TAT 6-24 HRS) Nasopharyngeal Nasopharyngeal Swab     Status: None   Collection Time: 06/14/19  9:34 AM   Specimen: Nasopharyngeal Swab  Result Value Ref Range Status   SARS Coronavirus 2 NEGATIVE NEGATIVE Final    Comment: (NOTE) SARS-CoV-2 target nucleic acids are NOT DETECTED. The SARS-CoV-2 RNA is generally detectable in upper and lower respiratory specimens during the acute phase of infection. Negative results do not preclude SARS-CoV-2 infection, do not rule out co-infections with other pathogens, and should not be used as the sole basis for treatment or other patient management decisions. Negative results must be combined with clinical observations, patient history, and epidemiological information. The expected result is Negative. Fact Sheet for Patients: SugarRoll.be Fact Sheet for Healthcare Providers: https://www.woods-mathews.com/ This test is not yet approved or cleared by the [place] FDA and  has been authorized for detection and/or diagnosis of SARS-CoV-2 by FDA under an Emergency Use Authorization (EUA). This EUA will remain  in effect (meaning this test can be used) for the duration of the COVID-19 declaration under Section 56 4(b)(1) of the Act, 21 U.S.C. section 360bbb-3(b)(1), unless the authorization is terminated or revoked sooner. Performed at [facility], [address] [place], [place] [address]          Radiology Studies: CT ABDOMEN PELVIS W WO CONTRAST  Result Date: 06/16/2019 CLINICAL DATA:  History of urologic cancer. Evaluate treatment response. EXAM: CT ABDOMEN AND PELVIS WITHOUT AND WITH CONTRAST TECHNIQUE: Multidetector CT imaging of the abdomen and pelvis was performed following the standard protocol before  and following the bolus administration of intravenous contrast. CONTRAST:  35mL OMNIPAQUE IOHEXOL 300 MG/ML  SOLN COMPARISON:  11/13/2017 FINDINGS: Lower chest: Unchanged appearance of bilateral lower lobe scarring. No acute abnormality noted. Hepatobiliary: There is a small low-density structure in the inferior right hepatic lobe measuring 5 mm, image 32/4 this was likely present on exam from 02/20/2017 and is favored to represent a benign abnormality. No additional focal liver abnormalities identified. Gallbladder negative. No biliary dilatation. Pancreas: Unremarkable. No pancreatic ductal dilatation or surrounding inflammatory changes. Spleen: Normal in size without focal abnormality. Adrenals/Urinary Tract: Normal appearance of the adrenal glands. Small hypoechoic structure arising from medial cortex of right kidney measures 7 mm and is unchanged from previous exam favoring a benign abnormality. Focal wedge-shaped area of scarring is noted within the posterior lateral right mid kidney, image 18/4. There is a stable low-density structure arising from upper pole of the right kidney measuring 1.2 by 0.7 cm, image 45/10. 5 mm low-density structures noted within the inferior pole of the left kidney, unchanged from previous exam. 8 mm exophytic low-density structure arising from anterolateral cortex of the upper pole of left kidney is unchanged. Findings are favored to represent small cysts. Thick walled urinary bladder is partially collapsed around a Foley catheter balloon. Unfortunately, the Foley catheter does not appear to be clamped and the excreted contrast material opacifying the bladder can be seen draining through the catheter. As a result there is incomplete distension of the urinary bladder and therefore incomplete visualization of underlying urothelial neoplasm. This is compounded by patient cachexia with decreased perivesicular fat. Within this limitation the partially collapsed bladder has a wall  thickness measuring 1.1 cm. A calcification is noted within the right posterior bladder measuring 0.6 cm. Within the posterior bladder there is a filling defect which is partially surrounded by locules of gas and excreted contrast material measuring 2.4 by 1.3 by 4.6 cm, image 57/17 and image 29/16. Stomach/Bowel: Stomach is within normal limits. Appendix appears normal. No evidence of bowel wall thickening, distention, or inflammatory changes. Vascular/Lymphatic: Aortic atherosclerosis. No aneurysm. No abdominal adenopathy. Reproductive: Enlarged prostate gland contains several metallic fiducial markers. Other: Small volume of free fluid noted within the pelvis. Musculoskeletal: No acute or significant osseous findings. IMPRESSION: 1. Unfortunately, the Foley catheter does not appear to be clamped and the excreted contrast material can be seen draining through the catheter. As a result there is incomplete distension of the urinary bladder and therefore incomplete visualization of underlying urothelial within the diffusely thick walled urinary bladder there is a filling defect within the posterior bladder which is partially surrounded by foci gas and excreted contrast material. Findings may represent recurrent urothelial neoplasm and or blood clot. 2. No convincing evidence for nodal metastasis or solid organ metastasis within the abdomen or pelvis. Cachexia and diminished peri fascicular fat within the pelvis diminishes sensitivity for detecting extra vesicular extension of disease. No obvious tumor invasion outside of the bladder noted. 3. Prostate gland enlargement. 4. Small volume of free fluid noted within the pelvis. Aortic Atherosclerosis (ICD10-I70.0). Electronically Signed   By: Kerby Moors M.D.   On: 06/16/2019  19:51   DG Chest 2 View  Result Date: 06/16/2019 CLINICAL DATA:  Patient with history of bladder and prostate cancer. EXAM: CHEST - 2 VIEW COMPARISON:  Chest radiograph 03/12/2019. FINDINGS:  Stable enlarged cardiac and mediastinal contours. Postsurgical changes left hemithorax. Interval decrease in right greater than left mid and lower lung opacities. Probable small right pleural effusion. No pneumothorax. IMPRESSION: Minimal heterogeneous opacities right lung base may represent atelectasis or infection. Probable small right pleural effusion. Electronically Signed   By: Lovey Newcomer M.D.   On: 06/16/2019 19:32        Scheduled Meds: . digoxin  0.125 mg Oral Daily  . nicotine  14 mg Transdermal Daily  . rosuvastatin  10 mg Oral QHS  . sulfamethoxazole-trimethoprim  1 tablet Oral Q12H  . tamsulosin  0.4 mg Oral Daily   Continuous Infusions:   LOS: 0 days     Cordelia Poche, MD [facility] 06/17/2019, 11:05 AM  If 7PM-7AM, please contact night-coverage [email or web address]

## 2019-06-17 NOTE — Evaluation (Signed)
Physical Therapy Evaluation Patient Details Name: Ian Hill MRN: [id number] DOB: August 16, 1950 Today's Date: 06/17/2019   History of Present Illness  69 y.o. male with medical history significant of bladder cancer, prostate cancer, CKD stage II, diverticulosis, hyperlipidemia, CVA, hypertension, hypertrophic cardiomegaly.  Patient presented to the hospital for planned TURBT performed by urology  Clinical Impression  Pt admitted with above diagnosis.  Pt currently with functional limitations due to the deficits listed below (see PT Problem List). Pt will benefit from skilled PT to increase their independence and safety with mobility to allow discharge to the venue listed below.  Pt states he lives with his sister and also reports, " I'm not supposed to walk alone."  Pt wants to get stronger and walk more to d/c back home.  Pt tolerated good distance in hallway so encouraged pt to ambulate again today with nursing staff.  Per chart review, pt has [facility], so HHPT likely through [facility].     Follow Up Recommendations Home health PT(likely through [facility])    Equipment Recommendations  None recommended by PT    Recommendations for Other Services       Precautions / Restrictions Precautions Precautions: Fall      Mobility  Bed Mobility Overal bed mobility: Needs Assistance Bed Mobility: Supine to Sit     Supine to sit: Supervision     General bed mobility comments: for safety  Transfers Overall transfer level: Needs assistance Equipment used: Rolling walker (2 wheeled) Transfers: Sit to/from Stand Sit to Stand: Min guard         General transfer comment: verbal cues for hand placement  Ambulation/Gait Ambulation/Gait assistance: Min guard Gait Distance (Feet): 400 Feet Assistive device: Rolling walker (2 wheeled) Gait Pattern/deviations: Step-through pattern;Decreased stride length     General Gait Details: slow but steady pace with RW, occasional assist to navigate  around obstacles in hallway  Stairs            Wheelchair Mobility    Modified Rankin (Stroke Patients Only)       Balance                                             Pertinent Vitals/Pain Pain Assessment: No/denies pain    Home Living Family/patient expects to be discharged to:: Private residence Living Arrangements: Other relatives Available Help at Discharge: Family;Available PRN/intermittently Type of Home: House Home Access: Stairs to enter Entrance Stairs-Rails: Psychiatric nurse of Steps: 3 Home Layout: One level Home Equipment: Cane - single point;Walker - 2 wheels Additional Comments: above per admission 3 months ago; pt reports he lives with his sister and states "I'm not supposed to walk alone."    Prior Function Level of Independence: Needs assistance   Gait / Transfers Assistance Needed: Uses cane at baseline  ADL's / Homemaking Assistance Needed: Performs own ADLs        Hand Dominance        Extremity/Trunk Assessment        Lower Extremity Assessment Lower Extremity Assessment: Generalized weakness    Cervical / Trunk Assessment Cervical / Trunk Assessment: Normal  Communication   Communication: No difficulties  Cognition Arousal/Alertness: Awake/alert Behavior During Therapy: WFL for tasks assessed/performed  General Comments: follows simple commands      General Comments      Exercises     Assessment/Plan    PT Assessment Patient needs continued PT services  PT Problem List Decreased strength;Decreased mobility;Decreased activity tolerance;Decreased knowledge of use of DME;Decreased balance       PT Treatment Interventions DME instruction;Gait training;Therapeutic exercise;Balance training;Functional mobility training;Therapeutic activities;Patient/family education    PT Goals (Current goals can be found in the Care Plan section)   Acute Rehab PT Goals PT Goal Formulation: With patient Time For Goal Achievement: 07/01/19 Potential to Achieve Goals: Good    Frequency Min 3X/week   Barriers to discharge        Co-evaluation               AM-PAC PT "6 Clicks" Mobility  Outcome Measure Help needed turning from your back to your side while in a flat bed without using bedrails?: None Help needed moving from lying on your back to sitting on the side of a flat bed without using bedrails?: None Help needed moving to and from a bed to a chair (including a wheelchair)?: A Little Help needed standing up from a chair using your arms (e.g., wheelchair or bedside chair)?: A Little Help needed to walk in hospital room?: A Little Help needed climbing 3-5 steps with a railing? : A Little 6 Click Score: 20    End of Session Equipment Utilized During Treatment: Gait belt Activity Tolerance: Patient tolerated treatment well Patient left: in chair;with call bell/phone within reach;with chair alarm set Nurse Communication: Mobility status PT Visit Diagnosis: Difficulty in walking, not elsewhere classified (R26.2)    Time: [id number] PT Time Calculation (min) (ACUTE ONLY): 19 min   Charges:   PT Evaluation $PT Eval Low Complexity: 1 Low         Kati PT, DPT Acute Rehabilitation Services Office: [phone]  Trena Platt 06/17/2019, 12:17 PM

## 2019-06-18 DIAGNOSIS — C678 Malignant neoplasm of overlapping sites of bladder: Secondary | ICD-10-CM | POA: Diagnosis not present

## 2019-06-18 DIAGNOSIS — D62 Acute posthemorrhagic anemia: Secondary | ICD-10-CM | POA: Diagnosis not present

## 2019-06-18 DIAGNOSIS — E785 Hyperlipidemia, unspecified: Secondary | ICD-10-CM | POA: Diagnosis not present

## 2019-06-18 DIAGNOSIS — I480 Paroxysmal atrial fibrillation: Secondary | ICD-10-CM | POA: Diagnosis not present

## 2019-06-18 MED ORDER — ENSURE ENLIVE PO LIQD: 237.0000 mL | Freq: Two times a day (BID) | ORAL | 12 refills | Status: AC

## 2019-06-18 NOTE — Progress Notes (Signed)
PROGRESS NOTE    Ian Hill  [id number] DOB: 22-Oct-1950 DOA: 06/16/2019 PCP: Linda Hedges, NP   Brief Narrative: Ian Hill is a 69 y.o. male with medical history significant of bladder cancer, prostate cancer, CKD stage II, diverticulosis, hyperlipidemia, CVA, hypertension, hypertrophic cardiomegaly.  Patient presented to the hospital for planned TURBT performed by urology.   Assessment & Plan:   Active Problems:   Malignant neoplasm of overlapping sites of bladder ([place])   Acute on chronic blood loss anemia Gross hematuria Baseline hemoglobin appears to be around 8 g/dL.  Hemoglobin prior to procedure of 7.2.  Patient received 2 units of PRBC per primary team. Post transfusion hemoglobin of 9. Hematuria appears to be stopped.  Rales Patient received 700 mL of lactated Ringer's, 250 mL of 5% albumin.  IV fluids while in the OR. Currently on IV fluids.  Patient without respiratory symptoms.  No history of heart failure with last EF of 60 to 65% although there was mention of ventricular hypertrophy. Chest x-ray was significant for possible atelectasis vs infection. No clinical signs of infection. Improved. -Continue incentive spirometry.  Weakness Patient describes his legs giving out. Per sister, he has had issues with his leg giving out for the last three weeks; he has not seen his primary physician. Falls are not associated with dyspnea, chest pain, prodrome. Weakness possibly exacerbated by anemia. Agree with PT consult which is still pending. No focal neurologic symptoms concerning for acute stroke or other CNS pathology. PT recommending home health PT to be arranged with [facility]. Recommend close PCP follow-up to ensure improvement with PT.  Paroxysmal atrial fibrillation/flutter Patient is currently rate controlled.  Appears to be in sinus rhythm at this time.   Patient is on digoxin and Eliquis as an outpatient. Avoid AV nodal blocking agents. -Continue  digoxin -Holding Eliquis secondary to gross hematuria and anemia; restart per urology recommendations in setting of hematuria  Hyperlipidemia -Continue Crestor 10 mg  History of intermittent complete heart block History of sinus pauses PPM discussed and declined per family discussion per chart review. In the setting of atenolol and shock from feraheme infusion. Atenolol previously discontinued.  CKD stage II Creatinine baseline and stable.  History of prostate and bladder cancer Patient is  status post external beam radiotherapy and has a history of TURBT. TURBT performed today revealing recurrence of 3 cm urothelial carcinoma bladder neck in addition to satellite lesion in the superior trigonal region measuring 1 cm.  Patient has Foley catheter placed. -Per primary -Flomax, Foley, Bactrim  Severe tricuspid valve regurgitation Biatrial enlargement Noted on Transthoracic Echocardiogram from 02/2019  Cognitive deficits Appears baseline.  History of CVA -Continue Crestor   DVT prophylaxis: Per primary Code Status:   Code Status: Full Code Family Communication: None at bedside Disposition Plan: Per primary. Patient is stable for outpatient management from a medical standpoint. Recommend close PCP follow-up for improvement of weakness with PT   Subjective: States he feels weak but states he walked very well with PT. States his weakness is better. Happy to be eating.  Objective: Vitals:   06/17/19 1339 06/17/19 2258 06/18/19 0607 06/18/19 0819  BP: 128/67 132/74 97/65 122/82  Pulse: 86 87 71 85  Resp:      Temp: 98.7 F (37.1 C)  98 F (36.7 C) 98.3 F (36.8 C)  TempSrc:   Oral Oral  SpO2: 100% 100% 100% 100%  Weight:      Height:        Intake/Output  Summary (Last 24 hours) at 06/18/2019 0906 Last data filed at 06/18/2019 [id number] Gross per 24 hour  Intake 480 ml  Output 960 ml  Net -480 ml   Filed Weights   06/16/19 2105  Weight: 53.1 kg     Examination:  General exam: Appears calm and comfortable Respiratory system: Slightly diminished without rales or wheezing. Respiratory effort normal. Cardiovascular system: S1 & S2 heard, RRR. 2/6 systolic murmur Gastrointestinal system: Abdomen is nondistended, soft and nontender. No organomegaly or masses felt. Normal bowel sounds heard. Central nervous system: Alert and oriented. No focal neurological deficits. Extremities: No edema. No calf tenderness Skin: No cyanosis. No rashes Psychiatry: Judgement and insight appear normal. Mood & affect appropriate. .     Data Reviewed: I have personally reviewed following labs and imaging studies  CBC: Recent Labs  Lab 06/16/19 0850 06/17/19 0811  WBC 4.2 9.4  HGB 7.2* 9.0*  HCT 23.6* 28.0*  MCV 89.4 87.0  PLT 279 [id number]   Basic Metabolic Panel: Recent Labs  Lab 06/16/19 0850  NA 139  K 5.1  CL 107  CO2 25  GLUCOSE 106*  BUN 23  CREATININE 1.37*  CALCIUM 9.3   GFR: Estimated Creatinine Clearance: 37.7 mL/min (A) (by C-G formula based on SCr of 1.37 mg/dL (H)). Liver Function Tests: No results for input(s): AST, ALT, ALKPHOS, BILITOT, PROT, ALBUMIN in the last 168 hours. No results for input(s): LIPASE, AMYLASE in the last 168 hours. No results for input(s): AMMONIA in the last 168 hours. Coagulation Profile: No results for input(s): INR, PROTIME in the last 168 hours. Cardiac Enzymes: No results for input(s): CKTOTAL, CKMB, CKMBINDEX, TROPONINI in the last 168 hours. BNP (last 3 results) No results for input(s): PROBNP in the last 8760 hours. HbA1C: No results for input(s): HGBA1C in the last 72 hours. CBG: No results for input(s): GLUCAP in the last 168 hours. Lipid Profile: No results for input(s): CHOL, HDL, LDLCALC, TRIG, CHOLHDL, LDLDIRECT in the last 72 hours. Thyroid Function Tests: No results for input(s): TSH, T4TOTAL, FREET4, T3FREE, THYROIDAB in the last 72 hours. Anemia Panel: No results for  input(s): VITAMINB12, FOLATE, FERRITIN, TIBC, IRON, RETICCTPCT in the last 72 hours. Sepsis Labs: No results for input(s): PROCALCITON, LATICACIDVEN in the last 168 hours.  Recent Results (from the past 240 hour(s))  SARS CORONAVIRUS 2 (TAT 6-24 HRS) Nasopharyngeal Nasopharyngeal Swab     Status: None   Collection Time: 06/14/19  9:34 AM   Specimen: Nasopharyngeal Swab  Result Value Ref Range Status   SARS Coronavirus 2 NEGATIVE NEGATIVE Final    Comment: (NOTE) SARS-CoV-2 target nucleic acids are NOT DETECTED. The SARS-CoV-2 RNA is generally detectable in upper and lower respiratory specimens during the acute phase of infection. Negative results do not preclude SARS-CoV-2 infection, do not rule out co-infections with other pathogens, and should not be used as the sole basis for treatment or other patient management decisions. Negative results must be combined with clinical observations, patient history, and epidemiological information. The expected result is Negative. Fact Sheet for Patients: SugarRoll.be Fact Sheet for Healthcare Providers: https://www.woods-mathews.com/ This test is not yet approved or cleared by the [place] FDA and  has been authorized for detection and/or diagnosis of SARS-CoV-2 by FDA under an Emergency Use Authorization (EUA). This EUA will remain  in effect (meaning this test can be used) for the duration of the COVID-19 declaration under Section 56 4(b)(1) of the Act, 21 U.S.C. section 360bbb-3(b)(1), unless the authorization is terminated or  revoked sooner. Performed at [facility], [place] [address] [place], [place] [address]          Radiology Studies: CT ABDOMEN PELVIS W WO CONTRAST  Result Date: 06/16/2019 CLINICAL DATA:  History of urologic cancer. Evaluate treatment response. EXAM: CT ABDOMEN AND PELVIS WITHOUT AND WITH CONTRAST TECHNIQUE: Multidetector CT imaging of the abdomen and pelvis  was performed following the standard protocol before and following the bolus administration of intravenous contrast. CONTRAST:  61mL OMNIPAQUE IOHEXOL 300 MG/ML  SOLN COMPARISON:  11/13/2017 FINDINGS: Lower chest: Unchanged appearance of bilateral lower lobe scarring. No acute abnormality noted. Hepatobiliary: There is a small low-density structure in the inferior right hepatic lobe measuring 5 mm, image 32/4 this was likely present on exam from 02/20/2017 and is favored to represent a benign abnormality. No additional focal liver abnormalities identified. Gallbladder negative. No biliary dilatation. Pancreas: Unremarkable. No pancreatic ductal dilatation or surrounding inflammatory changes. Spleen: Normal in size without focal abnormality. Adrenals/Urinary Tract: Normal appearance of the adrenal glands. Small hypoechoic structure arising from medial cortex of right kidney measures 7 mm and is unchanged from previous exam favoring a benign abnormality. Focal wedge-shaped area of scarring is noted within the posterior lateral right mid kidney, image 18/4. There is a stable low-density structure arising from upper pole of the right kidney measuring 1.2 by 0.7 cm, image 45/10. 5 mm low-density structures noted within the inferior pole of the left kidney, unchanged from previous exam. 8 mm exophytic low-density structure arising from anterolateral cortex of the upper pole of left kidney is unchanged. Findings are favored to represent small cysts. Thick walled urinary bladder is partially collapsed around a Foley catheter balloon. Unfortunately, the Foley catheter does not appear to be clamped and the excreted contrast material opacifying the bladder can be seen draining through the catheter. As a result there is incomplete distension of the urinary bladder and therefore incomplete visualization of underlying urothelial neoplasm. This is compounded by patient cachexia with decreased perivesicular fat. Within this  limitation the partially collapsed bladder has a wall thickness measuring 1.1 cm. A calcification is noted within the right posterior bladder measuring 0.6 cm. Within the posterior bladder there is a filling defect which is partially surrounded by locules of gas and excreted contrast material measuring 2.4 by 1.3 by 4.6 cm, image 57/17 and image 29/16. Stomach/Bowel: Stomach is within normal limits. Appendix appears normal. No evidence of bowel wall thickening, distention, or inflammatory changes. Vascular/Lymphatic: Aortic atherosclerosis. No aneurysm. No abdominal adenopathy. Reproductive: Enlarged prostate gland contains several metallic fiducial markers. Other: Small volume of free fluid noted within the pelvis. Musculoskeletal: No acute or significant osseous findings. IMPRESSION: 1. Unfortunately, the Foley catheter does not appear to be clamped and the excreted contrast material can be seen draining through the catheter. As a result there is incomplete distension of the urinary bladder and therefore incomplete visualization of underlying urothelial within the diffusely thick walled urinary bladder there is a filling defect within the posterior bladder which is partially surrounded by foci gas and excreted contrast material. Findings may represent recurrent urothelial neoplasm and or blood clot. 2. No convincing evidence for nodal metastasis or solid organ metastasis within the abdomen or pelvis. Cachexia and diminished peri fascicular fat within the pelvis diminishes sensitivity for detecting extra vesicular extension of disease. No obvious tumor invasion outside of the bladder noted. 3. Prostate gland enlargement. 4. Small volume of free fluid noted within the pelvis. Aortic Atherosclerosis (ICD10-I70.0). Electronically Signed  By: Kerby Moors M.D.   On: 06/16/2019 19:51   DG Chest 2 View  Result Date: 06/16/2019 CLINICAL DATA:  Patient with history of bladder and prostate cancer. EXAM: CHEST - 2 VIEW  COMPARISON:  Chest radiograph 03/12/2019. FINDINGS: Stable enlarged cardiac and mediastinal contours. Postsurgical changes left hemithorax. Interval decrease in right greater than left mid and lower lung opacities. Probable small right pleural effusion. No pneumothorax. IMPRESSION: Minimal heterogeneous opacities right lung base may represent atelectasis or infection. Probable small right pleural effusion. Electronically Signed   By: Lovey Newcomer M.D.   On: 06/16/2019 19:32        Scheduled Meds: . digoxin  0.125 mg Oral Daily  . feeding supplement (ENSURE ENLIVE)  237 mL Oral BID BM  . feeding supplement (PRO-STAT SUGAR FREE 64)  30 mL Oral BID  . multivitamin with minerals  1 tablet Oral Daily  . nicotine  14 mg Transdermal Daily  . rosuvastatin  10 mg Oral QHS  . sulfamethoxazole-trimethoprim  1 tablet Oral Q12H  . tamsulosin  0.4 mg Oral Daily   Continuous Infusions:   LOS: 0 days     Cordelia Poche, MD [facility] 06/18/2019, 9:06 AM  If 7PM-7AM, please contact night-coverage [email or web address]

## 2019-06-18 NOTE — Progress Notes (Signed)
All discharge information including medications discussed with patient and patients sister, [name]. Clothing and paperwork sent with patient. [facility] transportation here to transport patient home. No further questions from patient or sister.

## 2019-06-18 NOTE — TOC Progression Note (Signed)
Transition of Care [facility]) - Progression Note    Patient Details  Name: Ian Hill MRN: [id number] Date of Birth: 05-22-1950  [facility] CM/SW Contact  Joaquin Courts, RN Phone Number: 06/18/2019, 11:21 AM  Clinical Narrative:    CM spoke with [facility] on call nurse and notified of need for home health PT, which [facility] will set up.  CM also arranged transportation through [facility]. CM spoke with patient's sister who will be in the home today and notified her of planned discharge and [facility] transportation hours from 10 am-4 pm which the sister states is acceptable.     Expected Discharge Plan: [place] Barriers to Discharge: Continued Medical Work up  Expected Discharge Plan and Services Expected Discharge Plan: [place]   Discharge Planning Services: CM Consult   Living arrangements for the past 2 months: Single Family Home                           HH Arranged: PT [facility] Agency: Other - See comment([facility] provides their own HHPT)         Social Determinants of Health (SDOH) Interventions    Readmission Risk Interventions No flowsheet data found.

## 2019-06-18 NOTE — Progress Notes (Signed)
Spoke to sister, [name], w/ whom the patient lives with.  She is nervous about his discharge.  I will ask case manager to contact her and work through concerns.  [phone]

## 2019-06-18 NOTE — Discharge Summary (Signed)
Date of admission: 06/16/2019  Date of discharge: 06/18/2019  Admission diagnosis: recurrent bladder cancer of bladder neck, chronic anemia  Discharge diagnosis: same, deconditioning, mal-nutrition  Secondary diagnoses:  Patient Active Problem List   Diagnosis Date Noted  . Malignant neoplasm of overlapping sites of bladder ([place]) 06/16/2019  . Anaphylaxis 03/08/2019  . Hematuria 03/03/2019  . History of CVA (cerebrovascular accident) 03/03/2019  . Mitral regurgitation 03/03/2019  . E coli bacteremia   . E coli enteritis   . Emesis 11/13/2017  . Nausea vomiting and diarrhea   . Viral gastroenteritis   . Permanent atrial fibrillation ([place])   . Bronchiectasis without complication ([place])   . Hypertrophic cardiomegaly, apical variant   . Smoker 03/25/2017  . Chronic anticoagulation 03/25/2017  . Chronic atrial fibrillation ([place]) 03/25/2017  . Prostate cancer ([place]) 02/26/2017  . Cerebrovascular accident (CVA) due to embolic occlusion of right middle cerebral artery ([place]) 02/25/2017  . Left-sided neglect 01/30/2017  . Gait disturbance, post-stroke 01/30/2017  . Left hemiparesis ([place])   . PAF (paroxysmal atrial fibrillation) ([place])   . Benign prostatic hyperplasia   . Hyperlipidemia   . Thrombocytopenia ([place])   . Protein-calorie malnutrition, severe 01/24/2017  . Acute right arterial ischemic stroke, middle cerebral artery (MCA) ([place]) 01/23/2017  . Acute respiratory failure ([place]) 01/23/2017  . Essential hypertension 01/23/2017  . Acute metabolic encephalopathy 70/62/3762  . Endotracheal tube present   . Cerebrovascular accident (CVA) due to occlusion of right middle cerebral artery ([place]) - Right middle cerebral artery branch infarct do to right M1 occlusion treated with IV TPA and mechanical embolect   . Stage 3 chronic kidney disease   . Enlarged prostate with urinary retention 11/24/2016  . Lung nodule 09/16/2016  . Volume overload 09/10/2016  . Urinary retention 09/10/2016  . BPH  (benign prostatic hyperplasia) 09/01/2016  . Periumbilical abdominal pain 09/01/2016  . Cerumen impaction 02/16/2016  . Decreased dorsalis pedis pulse 02/16/2016  . CKD (chronic kidney disease), stage II 02/20/2012  . Preventative health care 06/26/2010  . Other symptoms involving cardiovascular system 11/19/2009  . Hypertrophic obstructive cardiomyopathy ([place]) 10/10/2009  . Hyperlipidemia 04/30/2006  . TOBACCO ABUSE 04/30/2006  . Essential hypertension 04/30/2006  . ELEVATED PROSTATE SPECIFIC ANTIGEN 04/30/2006  . DIVERTICULOSIS, COLON 12/18/2005    Procedures performed: Procedure(s): TRANSURETHERAL RESECTION OF BLADDER TUMOR.  History and Physical: For full details, please see admission history and physical. Briefly, Ian Hill is a 69 y.o. year old patient with history of bladder cancer and noted to have recurrence on f/u.  He was also noted to be anemic.     Hospital Course: Patient tolerated the procedure well.  He was then transferred to the floor after an uneventful PACU stay.  His hospital course was uncomplicated.  He was transfused 2 units of PRBC and admitted.  He was evaluated by internal medicine, nutrition and PT.  On POD#2 he had met discharge criteria: was eating a regular diet, was up and ambulating independently,  pain was well controlled, was voiding without a catheter, and was ready to for discharge.   PT recommendations:  Clinical Impression  Pt admitted with above diagnosis.  Pt currently with functional limitations due to the deficits listed below (see PT Problem List). Pt will benefit from skilled PT to increase their independence and safety with mobility to allow discharge to the venue listed below.  Pt states he lives with his sister and also reports, " I'm not supposed to walk alone."  Pt wants to  get stronger and walk more to d/c back home.  Pt tolerated good distance in hallway so encouraged pt to ambulate again today with nursing staff.  Per chart  review, [facility] has [facility], so HHPT likely through [facility].     Follow Up Recommendations Home health PT(likely through [facility])    Equipment Recommendations  None recommended by PT       Nutritional assessment and recommendations: INTERVENTION:  - will order Ensure Enlive BID, each supplement provides 350 kcal and 20 grams of protein. - will order Magic Cup with lunch meals, each supplement provides 290 kcal and 9 grams of protein. - will order 30 mL Prostat BID, each supplement provides 100 kcal and 15 grams of protein. - will order daily multivitamin with minerals.  - continue to encourage PO intakes.    NUTRITION DIAGNOSIS:   Increased nutrient needs related to acute illness, chronic illness, cancer and cancer related treatments, post-op healing as evidenced by estimated needs.  GOAL:   Patient will meet greater than or equal to 90% of their needs  MONITOR:   PO intake, Supplement acceptance, Labs, Weight trends  Laboratory values:  Recent Labs    06/16/19 0850 06/17/19 0811  WBC 4.2 9.4  HGB 7.2* 9.0*  HCT 23.6* 28.0*   Recent Labs    06/16/19 0850  NA 139  K 5.1  CL 107  CO2 25  GLUCOSE 106*  BUN 23  CREATININE 1.37*  CALCIUM 9.3   No results for input(s): LABPT, INR in the last 72 hours. No results for input(s): LABURIN in the last 72 hours. Results for orders placed or performed during the hospital encounter of 06/14/19  SARS CORONAVIRUS 2 (TAT 6-24 HRS) Nasopharyngeal Nasopharyngeal Swab     Status: None   Collection Time: 06/14/19  9:34 AM   Specimen: Nasopharyngeal Swab  Result Value Ref Range Status   SARS Coronavirus 2 NEGATIVE NEGATIVE Final    Comment: (NOTE) SARS-CoV-2 target nucleic acids are NOT DETECTED. The SARS-CoV-2 RNA is generally detectable in upper and lower respiratory specimens during the acute phase of infection. Negative results do not preclude SARS-CoV-2 infection, do not rule out co-infections with other pathogens, and  should not be used as the sole basis for treatment or other patient management decisions. Negative results must be combined with clinical observations, patient history, and epidemiological information. The expected result is Negative. Fact Sheet for Patients: SugarRoll.be Fact Sheet for Healthcare Providers: https://www.woods-mathews.com/ This test is not yet approved or cleared by the [place] FDA and  has been authorized for detection and/or diagnosis of SARS-CoV-2 by FDA under an Emergency Use Authorization (EUA). This EUA will remain  in effect (meaning this test can be used) for the duration of the COVID-19 declaration under Section 56 4(b)(1) of the Act, 21 U.S.C. section 360bbb-3(b)(1), unless the authorization is terminated or revoked sooner. Performed at [facility], [place] [address] [place], [place] [address]     Disposition: Home  Discharge instruction: The patient was instructed to be ambulatory but told to refrain from heavy lifting, strenuous activity, or driving.   Discharge medications:  Allergies as of 06/18/2019      Reactions   Feraheme [ferumoxytol] Anaphylaxis      Medication List    TAKE these medications   apixaban 5 MG Tabs tablet Commonly known as: ELIQUIS Take 1 tablet (5 mg total) by mouth 2 (two) times daily.   AYR SALINE NASAL NA Place 2 sprays into the nose 4 (  four) times daily as needed.   digoxin 0.125 MG tablet Commonly known as: LANOXIN Take 0.125 mg by mouth daily.   feeding supplement (ENSURE ENLIVE) Liqd Take 237 mLs by mouth 3 (three) times daily. What changed: Another medication with the same name was added. Make sure you understand how and when to take each.   feeding supplement (ENSURE ENLIVE) Liqd Take 237 mLs by mouth 2 (two) times daily between meals. What changed: You were already taking a medication with the same name, and this prescription was added. Make sure you  understand how and when to take each.   finasteride 5 MG tablet Commonly known as: PROSCAR Take 1 tablet (5 mg total) by mouth daily.   nicotine 14 mg/24hr patch Commonly known as: NICODERM CQ - dosed in mg/24 hours Place 14 mg onto the skin daily.   nicotine polacrilex 2 MG gum Commonly known as: NICORETTE Take 2 mg by mouth as needed for smoking cessation.   phenazopyridine 200 MG tablet Commonly known as: PYRIDIUM Take 200 mg by mouth 2 (two) times daily as needed for pain.   polyethylene glycol 17 g packet Commonly known as: MIRALAX / GLYCOLAX Take 17 g by mouth daily as needed.   rosuvastatin 10 MG tablet Commonly known as: CRESTOR Take 10 mg by mouth at bedtime.   tamsulosin 0.4 MG Caps capsule Commonly known as: FLOMAX Take 1 capsule (0.4 mg total) by mouth daily.   TUMS GAS RELIEF CHEWY BITES PO Take 1 tablet by mouth daily as needed (for gas).   Vitamin D 50 MCG (2000 UT) Caps Take 2,000 Units by mouth daily.       Followup:  Follow-up Information    Franchot Gallo, MD.   Specialty: Urology Why: I will call with results and follow-up Contact information: [place] [address] [phone]

## 2019-06-18 NOTE — Progress Notes (Signed)
Patient had no acute events, has been voiding with a condom catheter.  Clear yellow urine. Worked with PT, home PT recommended. Medical team recommended outpatient f/u.  Patient complains of feeling weak, "weak stomach", but has eaten and denies nasuea. Reluctant to get up and move around b/c of weakness.  Vitals:   06/17/19 1339 06/17/19 2258 06/18/19 0607 06/18/19 0819  BP: 128/67 132/74 97/65 122/82  Pulse: 86 87 71 85  Resp:      Temp: 98.7 F (37.1 C)  98 F (36.7 C) 98.3 F (36.8 C)  TempSrc:   Oral Oral  SpO2: 100% 100% 100% 100%  Weight:      Height:        Intake/Output Summary (Last 24 hours) at 06/18/2019 0926 Last data filed at 06/18/2019 [id number] Gross per 24 hour  Intake 480 ml  Output 960 ml  Net -480 ml   NAD Abdomen is soft. Condom catheter is clear Ext symm.  Recent Labs    06/16/19 0850 06/17/19 0811  WBC 4.2 9.4  HGB 7.2* 9.0*  HCT 23.6* 28.0*   Recent Labs    06/16/19 0850  NA 139  K 5.1  CL 107  CO2 25  GLUCOSE 106*  BUN 23  CREATININE 1.37*  CALCIUM 9.3   No results for input(s): LABPT, INR in the last 72 hours. No results for input(s): PSA in the last 72 hours. No results for input(s): LABURIN in the last 72 hours. Results for orders placed or performed during the hospital encounter of 06/14/19  SARS CORONAVIRUS 2 (TAT 6-24 HRS) Nasopharyngeal Nasopharyngeal Swab     Status: None   Collection Time: 06/14/19  9:34 AM   Specimen: Nasopharyngeal Swab  Result Value Ref Range Status   SARS Coronavirus 2 NEGATIVE NEGATIVE Final    Comment: (NOTE) SARS-CoV-2 target nucleic acids are NOT DETECTED. The SARS-CoV-2 RNA is generally detectable in upper and lower respiratory specimens during the acute phase of infection. Negative results do not preclude SARS-CoV-2 infection, do not rule out co-infections with other pathogens, and should not be used as the sole basis for treatment or other patient management decisions. Negative results must be  combined with clinical observations, patient history, and epidemiological information. The expected result is Negative. Fact Sheet for Patients: SugarRoll.be Fact Sheet for Healthcare Providers: https://www.woods-mathews.com/ This test is not yet approved or cleared by the [place] FDA and  has been authorized for detection and/or diagnosis of SARS-CoV-2 by FDA under an Emergency Use Authorization (EUA). This EUA will remain  in effect (meaning this test can be used) for the duration of the COVID-19 declaration under Section 56 4(b)(1) of the Act, 21 U.S.C. section 360bbb-3(b)(1), unless the authorization is terminated or revoked sooner. Performed at [facility], [place] [address], [place], [place] [address]     Imp: Appears stable for discharge, but patient somewhat resistant to the idea.  Unclear his social situation and if discharge at this time is safe.  Will ask resource coordinator for input and hopefully can sort out discharge today vs. Tomorrow. Plan:  Continue current care, work towards discharge.  Home PT vs. PT at [facility].  Please text/call discharge updates: [phone]

## 2019-06-18 NOTE — Progress Notes (Signed)
Called Dr. Louis Meckel to verify when it was appropriate for patient to resume eliquis. Per Dr. Louis Meckel, ok to resume on discharge. Patient made aware.

## 2019-06-21 LAB — SURGICAL PATHOLOGY

## 2019-09-07 NOTE — Progress Notes (Signed)
Virtual Visit via Video Note changed to phone visit at patient request.   This visit type was conducted due to national recommendations for restrictions regarding the COVID-19 Pandemic (e.g. social distancing) in an effort to limit this patient's exposure and mitigate transmission in our community.  Due to his co-morbid illnesses, this patient is at least at moderate risk for complications without adequate follow up.  This format is felt to be most appropriate for this patient at this time.  All issues noted in this document were discussed and addressed.  A limited physical exam was performed with this format.  Please refer to the patient's chart for his consent to telehealth for [facility].   Date:  09/09/2019   ID:  Ian Hill, DOB June 28, 1950, MRN [id number]  Patient Location:Home Provider Location: Home  PCP:  Linda Hedges, NP  Cardiologist:  Dr Stanford Breed  Evaluation Performed:  Follow-Up Visit  Chief Complaint:  FU HOCM  History of Present Illness:    FU hypertrophic cardiomyopathy. Abdominal ultrasound in August of 2011 showed no aneurysm. Carotid Dopplers showed 0-39% bilateral stenosis. Holter monitor showed nonsustained ventricular tachycardia (7 beats). Stress echocardiogram showed possible inferior hypokinesis but systolic blood pressure decreased with exercise. We discussed referral for ICD but the family ultimately declined understanding risk of sudden cardiac death. Nuclear study 12/15/16 showed normal perfusion. Patient was admitted September 2018 and had a CVA. Patient was noted to have occluded right vertebral artery and underwent percutaneous intervention.  During that admission he was noted to have atrial fibrillation/flutter. He was placed on apixaban. Last echocardiogram October 2020 showed normal LV function, severe left ventricular hypertrophy more prominent in the septum and apex, biatrial enlargement, mild mitral regurgitation,, severe tricuspid  regurgitation and moderate pulmonary hypertension.  Patient also being treated for prostate cancer.  Patient had intermittent complete heart block during previous hospitalization.  Pacemaker was discussed but watchful waiting was the ultimate decision.  Beta-blocker discontinued.  Since last seen, he denies dyspnea, chest pain or syncope.  [name] having mild hematuria at times by his report.  The patient does not have symptoms concerning for COVID-19 infection (fever, chills, cough, or new shortness of breath).    Past Medical History:  Diagnosis Date   Anticoagulant long-term use    eliquis   Bladder tumor    BPH (benign prostatic hyperplasia)    CKD (chronic kidney disease), stage II    Diverticulosis    Dyslipidemia    History of CVA with residual deficit 01/28/2017   acute right MCA infarc with M2 embolic occlusion w/p  tPA and mechnical thrombectomy---  residual left side weakness   History of external beam radiation therapy    03-23-2018  to 05-03-2018  prostate cancer   Hypertension    Hypertensive heart disease    Hypertrophic cardiomegaly, apical variant    Hypertrophic obstructive cardiomyopathy with diastolic heart failure [facility])    cardiologist-- dr Stanford Breed   Mitral regurgitation    Other problems related to education and literacy    can not read   Prostate cancer [facility]) urologist-  dr dahlstedt/  oncologsit-- dr Tammi Klippel   dx 07/ 2018---  Stage T1a,  Gleason 4+3--- completed external radiation therapy 05-03-2018   Stroke [facility]) 2019   Past Surgical History:  Procedure Laterality Date   aspergilloma resection     LUL, s/p resection Feb 1991 SISTER NOT SURE OF THIS   CARDIOVASCULAR STRESS TEST  11-19-2016  dr Stanford Breed   Low risk nuclear  study/  normal resting and stress perfusion with no ischemia or infarction/  EF not done due to PVCIs , TID borderline, 1.2 significant LVH   CYSTOSCOPY WITH BIOPSY N/A 06/16/2019   Procedure: TRANSURETHERAL RESECTION OF  BLADDER TUMOR.;  Surgeon: Franchot Gallo, MD;  Location: [facility];  Service: Urology;  Laterality: N/A;  [place] N/A 03/10/2018   Procedure: GOLD SEED IMPLANT;  Surgeon: Franchot Gallo, MD;  Location: [facility];  Service: Urology;  Laterality: N/A;   IR PERCUTANEOUS ART THROMBECTOMY/INFUSION INTRACRANIAL INC DIAG ANGIO  01/23/2017   RADIOLOGY WITH ANESTHESIA N/A 01/23/2017   Procedure: RADIOLOGY WITH ANESTHESIA;  Surgeon: Luanne Bras, MD;  Location: [place];  Service: Radiology;  Laterality: N/A;   SPACE OAR INSTILLATION N/A 03/10/2018   Procedure: SPACE OAR INSTILLATION;  Surgeon: Franchot Gallo, MD;  Location: [facility];  Service: Urology;  Laterality: N/A;   TRANSTHORACIC ECHOCARDIOGRAM  11-20-2016   dr Stanford Breed   mild LVH,  apical hypertrophic cardiomyopathy, systolic function vigorous,  ef 65-70%,  grade 2 diastolic dysfunction/  mild AR, MR, and  PR/  moderate LAE and RAE/  mild to mod. TR/  moderate increase PASP 11mmHg   TRANSURETHRAL RESECTION OF BLADDER TUMOR WITH MITOMYCIN-C N/A 12/09/2018   Procedure: TRANSURETHRAL RESECTION OF BLADDER TUMOR WITH GEMCITABINE GIVEN IN PACU;  Surgeon: Franchot Gallo, MD;  Location: [facility];  Service: Urology;  Laterality: N/A;   TRANSURETHRAL RESECTION OF PROSTATE N/A 11/24/2016   Procedure: TRANSURETHRAL RESECTION OF THE PROSTATE (TURP);  Surgeon: Franchot Gallo, MD;  Location: [facility];  Service: Urology;  Laterality: N/A;     Current Meds  Medication Sig   apixaban (ELIQUIS) 5 MG TABS tablet Take 1 tablet (5 mg total) by mouth 2 (two) times daily.   digoxin (LANOXIN) 0.125 MG tablet Take 0.125 mg by mouth daily.   feeding supplement, ENSURE ENLIVE, (ENSURE ENLIVE) LIQD Take 237 mLs by mouth 3 (three) times daily.   feeding supplement, ENSURE ENLIVE, (ENSURE ENLIVE) LIQD Take 237 mLs by mouth 2 (two) times daily between meals.    finasteride (PROSCAR) 5 MG tablet Take 1 tablet (5 mg total) by mouth daily.   nicotine (NICODERM CQ - DOSED IN MG/24 HOURS) 14 mg/24hr patch Place 14 mg onto the skin daily.   nicotine polacrilex (NICORETTE) 2 MG gum Take 2 mg by mouth as needed for smoking cessation.   phenazopyridine (PYRIDIUM) 200 MG tablet Take 200 mg by mouth 2 (two) times daily as needed for pain.   polyethylene glycol (MIRALAX / GLYCOLAX) packet Take 17 g by mouth daily as needed.   rosuvastatin (CRESTOR) 10 MG tablet Take 10 mg by mouth at bedtime.    tamsulosin (FLOMAX) 0.4 MG CAPS capsule Take 1 capsule (0.4 mg total) by mouth daily.   [DISCONTINUED] AYR SALINE NASAL NA Place 2 sprays into the nose 4 (four) times daily as needed.   [DISCONTINUED] Calcium Carbonate-Simethicone (TUMS GAS RELIEF CHEWY BITES PO) Take 1 tablet by mouth daily as needed (for gas).    [DISCONTINUED] Cholecalciferol (VITAMIN D) 2000 units CAPS Take 2,000 Units by mouth daily.     Allergies:   Feraheme [ferumoxytol]   Social History   Tobacco Use   Smoking status: Current Some Day Smoker    Packs/day: 0.50    Years: 51.00    Pack years: 25.50    Types: Cigarettes   Smokeless tobacco: Never Used  Substance Use Topics  Alcohol use: No   Drug use: No     Family Hx: The patient's family history includes Cancer in his paternal aunt.  ROS:   Please see the history of present illness.    No Fever, chills  or productive cough All other systems reviewed and are negative.   Recent Labs: 03/08/2019: ALT 21 03/14/2019: Magnesium 2.0 06/16/2019: BUN 23; Creatinine, Ser 1.37; Potassium 5.1; Sodium 139 06/17/2019: Hemoglobin 9.0; Platelets 237   Recent Lipid Panel Lab Results  Component Value Date/Time   CHOL 102 03/09/2019 10:03 AM   TRIG 32 03/09/2019 10:03 AM   HDL 53 03/09/2019 10:03 AM   CHOLHDL 1.9 03/09/2019 10:03 AM   LDLCALC 43 03/09/2019 10:03 AM    Wt Readings from Last 3 Encounters:  06/16/19 117 lb (53.1  kg)  04/18/19 120 lb (54.4 kg)  03/14/19 106 lb 1.6 oz (48.1 kg)     Objective:    Vital Signs:  There were no vitals taken for this visit.   VITAL SIGNS:  reviewed NAD Answers questions appropriately Normal affect Remainder of physical examination not performed (telehealth visit; coronavirus pandemic)  ASSESSMENT & PLAN:    1. Hypertrophic apical variant cardiomyopathy-beta-blocker discontinued previously due to bradycardia.  As outlined in previous notes they have declined ICD previously. 2. Paroxysmal atrial fibrillation-plan to continue rate control and anticoagulation.  Continue digoxin (initiated by EP previously; atenolol DCed due to bradycardia) and apixaban.  Note he is having mild intermittent hematuria.  We will check hemoglobin.  If this persists may need to discontinue apixaban. 3. Hypertension- Continue present medications. 4. Hyperlipidemia-continue statin. 5. Tobacco abuse-patient counseled on discontinuing.  6. COVID-19 Education: The importance of social distancing was discussed today.  Time:   Today, I have spent 16 minutes with the patient with telehealth technology discussing the above problems.     Medication Adjustments/Labs and Tests Ordered: Current medicines are reviewed at length with the patient today.  Concerns regarding medicines are outlined above.   Tests Ordered: No orders of the defined types were placed in this encounter.   Medication Changes: No orders of the defined types were placed in this encounter.   Follow Up:  In Person in 6 month(s)  Signed, Kirk Ruths, MD  09/09/2019 9:09 AM    [place]

## 2019-09-09 ENCOUNTER — Telehealth (INDEPENDENT_AMBULATORY_CARE_PROVIDER_SITE_OTHER): Payer: Medicare (Managed Care) | Admitting: Cardiology

## 2019-09-09 ENCOUNTER — Encounter: Payer: Self-pay | Admitting: Cardiology

## 2019-09-09 ENCOUNTER — Telehealth: Payer: Self-pay | Admitting: *Deleted

## 2019-09-09 DIAGNOSIS — I480 Paroxysmal atrial fibrillation: Secondary | ICD-10-CM | POA: Diagnosis not present

## 2019-09-09 DIAGNOSIS — I10 Essential (primary) hypertension: Secondary | ICD-10-CM

## 2019-09-09 DIAGNOSIS — I517 Cardiomegaly: Secondary | ICD-10-CM

## 2019-09-09 NOTE — Patient Instructions (Signed)
Medication Instructions:  NO CHANGE *If you need a refill on your cardiac medications before your next appointment, please call your pharmacy*   Lab Work: Your physician recommends that you return for lab work NEXT WEEK  If you have labs (blood work) drawn today and your tests are completely normal, you will receive your results only by: Marland Kitchen MyChart Message (if you have MyChart) OR . A paper copy in the mail If you have any lab test that is abnormal or we need to change your treatment, we will call you to review the results.   Follow-Up: At [facility], you and your health needs are our priority.  As part of our continuing mission to provide you with exceptional heart care, we have created designated Provider Care Teams.  These Care Teams include your primary Cardiologist (physician) and Advanced Practice Providers (APPs -  Physician Assistants and Nurse Practitioners) who all work together to provide you with the care you need, when you need it.  We recommend signing up for the patient portal called "MyChart".  Sign up information is provided on this After Visit Summary.  MyChart is used to connect with patients for Virtual Visits (Telemedicine).  Patients are able to view lab/test results, encounter notes, upcoming appointments, etc.  Non-urgent messages can be sent to your provider as well.   To learn more about what you can do with MyChart, go to [email or web address].    Your next appointment:   6 month(s)  The format for your next appointment:   Either In Person or Virtual  Provider:   You may see Kirk Ruths, MD or one of the following Advanced Practice Providers on your designated Care Team:    Kerin Ransom, PA-C  [place], [place]  Coletta Memos, [place]

## 2019-09-09 NOTE — Telephone Encounter (Signed)
Left message for pt sister to call. Patient had a virtual visit with dr Stanford Breed this morning, we need to confirm his medications with her and dr Stanford Breed would like him to have lab work next week to check his blood count because of hematuria. We will see him back in 6 months. Lab orders mailed to the pt

## 2019-10-04 ENCOUNTER — Telehealth: Payer: Self-pay | Admitting: Cardiology

## 2019-10-04 DIAGNOSIS — Z79899 Other long term (current) drug therapy: Secondary | ICD-10-CM

## 2019-10-04 DIAGNOSIS — Z01810 Encounter for preprocedural cardiovascular examination: Secondary | ICD-10-CM

## 2019-10-04 NOTE — Telephone Encounter (Signed)
Pt should be considered high risk for any procedure; hold apixaban 2 days prior to procedure and resume after when ok with urology Kirk Ruths

## 2019-10-04 NOTE — Telephone Encounter (Signed)
LAB ORDER ENTERED LM2CB TO DISCUSS LAB/CARDIAC CLEARANCE.

## 2019-10-04 NOTE — Telephone Encounter (Signed)
       [facility] Pre-operative Risk Assessment    HEARTCARE STAFF: - Please ensure there is not already an duplicate clearance open for this procedure. - Under Visit Info/Reason for Call, type in Other and utilize the format Clearance MM/DD/YY or Clearance TBD. Do not use dashes or single digits. - If request is for dental extraction, please clarify the # of teeth to be extracted.  Request for surgical clearance:  1. What type of surgery is being performed? Bladder biopsy and cauterization of bleeders  2. When is this surgery scheduled? TBD  3. What type of clearance is required (medical clearance vs. Pharmacy clearance to hold med vs. Both)? Both  4. Are there any medications that need to be held prior to surgery and how long? Eliquis, 48 hours prior surgery  5. Practice name and name of physician performing surgery? Dr. Franchot Gallo - [facility]  6. What is the office phone number? [phone] ext [phone]   7.   What is the office fax number? [phone]   8.   Anesthesia type (None, local, MAC, general) ? General   Angeline S Hammer 10/04/2019, 8:54 AM  _________________________________________________________________   (provider comments below)

## 2019-10-04 NOTE — Telephone Encounter (Addendum)
Patient with diagnosis of Afib on Eliquis 5 mg twice daily (appropriate dose) for anticoagulation.    Procedure: bladder biopsy and cauterization of bleeders Date of procedure: TBD  CHADS2-VASc score of  5 (CHF, HTN, agex1, stroke/tiax2)  CrCl 55 mL/min (IBW) Platelet count 237 (06/2019)  Considering high bleed risk procedure (typically hold Eliquis at least 2 days) as well as high clotting risk (typically hold no more than 1 day), will defer to MD for input regarding anticoagulation hold.   It is important to consider patient also needs updated CBC per Dr. Jacalyn Lefevre most recent office visit to assess long term anticoagulation safety.

## 2019-10-06 ENCOUNTER — Other Ambulatory Visit: Payer: Self-pay | Admitting: Urology

## 2019-10-06 NOTE — Telephone Encounter (Signed)
Per JC:  Please contact and set up CBC for patient. We will need to see CBC prior to granting clearance  Ian Hill (SISTER) notified she will try bring him on Tuesday. Notified to hold Eliquis 2 days prior to procedure this is now scheduled for 10-31-2019 so she will have him hold Eliquis 6-19 and 6-20.  Faxed via [place] fax function to requesting providers office

## 2019-10-13 ENCOUNTER — Telehealth: Payer: Self-pay | Admitting: Cardiology

## 2019-10-13 NOTE — Telephone Encounter (Signed)
New message   [facility] needs  Order for labs sent to them. Please fax to [phone]. Please call if needed to discuss.

## 2019-10-13 NOTE — Telephone Encounter (Signed)
[facility] orders faxed to the number provided

## 2019-10-14 NOTE — Patient Instructions (Signed)
DUE TO COVID-19 ONLY ONE VISITOR IS ALLOWED TO COME WITH YOU AND STAY IN THE WAITING ROOM ONLY DURING PRE OP AND PROCEDURE DAY OF SURGERY. THE 1 VISITOR MAY VISIT WITH YOU AFTER SURGERY IN YOUR PRIVATE ROOM DURING VISITING HOURS ONLY!  YOU NEED TO HAVE A COVID 19 TEST ON 10-27-19 @_______ , THIS TEST MUST BE DONE BEFORE SURGERY, COME  [place] , [address].  ([place]) ONCE YOUR COVID TEST IS COMPLETED, PLEASE BEGIN THE QUARANTINE INSTRUCTIONS AS OUTLINED IN YOUR HANDOUT.                Ian Hill  10/14/2019   Your procedure is scheduled on: 10-31-19   Report to [facility] Main  Entrance    Report to Admitting at 6:15 AM     Call this number if you have problems the morning of surgery [phone]    Remember: Do not eat food or drink liquids :After Midnight.     Take these medicines the morning of surgery with A SIP OF WATER: Digoxin (Lanoxin), Finasteride (Proscar), and Tamsulosin (Flomax)  BRUSH YOUR TEETH MORNING OF SURGERY AND RINSE YOUR MOUTH OUT, NO CHEWING GUM CANDY OR MINTS.                                 You may not have any metal on your body including hair pins and              piercings    Do not wear jewelry, cologne, lotions, powders or deodorant              Men may shave face and neck.   Do not bring valuables to the hospital. [place].  Contacts, dentures or bridgework may not be worn into surgery.       Patients discharged the day of surgery will not be allowed to drive home. IF YOU ARE HAVING SURGERY AND GOING HOME THE SAME DAY, YOU MUST HAVE AN ADULT TO DRIVE YOU HOME AND BE WITH YOU FOR 24 HOURS. YOU MAY GO HOME BY TAXI OR UBER OR ORTHERWISE, BUT AN ADULT MUST ACCOMPANY YOU HOME AND STAY WITH YOU FOR 24 HOURS.  Name and phone number of your driver:  Special Instructions: N/A              Please read over the following fact sheets you were  given: _____________________________________________________________________             [facility] - Preparing for Surgery Before surgery, you can play an important role.  Because skin is not sterile, your skin needs to be as free of germs as possible.  You can reduce the number of germs on your skin by washing with CHG (chlorahexidine gluconate) soap before surgery.  CHG is an antiseptic cleaner which kills germs and bonds with the skin to continue killing germs even after washing. Please DO NOT use if you have an allergy to CHG or antibacterial soaps.  If your skin becomes reddened/irritated stop using the CHG and inform your nurse when you arrive at Short Stay. Do not shave (including legs and underarms) for at least 48 hours prior to the first CHG shower.  You may shave your face/neck. Please follow these instructions carefully:  1.  Shower with  CHG Soap the night before surgery and the  morning of Surgery.  2.  If you choose to wash your hair, wash your hair first as usual with your  normal  shampoo.  3.  After you shampoo, rinse your hair and body thoroughly to remove the  shampoo.                           4.  Use CHG as you would any other liquid soap.  You can apply chg directly  to the skin and wash                       Gently with a scrungie or clean washcloth.  5.  Apply the CHG Soap to your body ONLY FROM THE NECK DOWN.   Do not use on face/ open                           Wound or open sores. Avoid contact with eyes, ears mouth and genitals (private parts).                       Wash face,  Genitals (private parts) with your normal soap.             6.  Wash thoroughly, paying special attention to the area where your surgery  will be performed.  7.  Thoroughly rinse your body with warm water from the neck down.  8.  DO NOT shower/wash with your normal soap after using and rinsing off  the CHG Soap.                9.  Pat yourself dry with a clean towel.            10.  Wear  clean pajamas.            11.  Place clean sheets on your bed the night of your first shower and do not  sleep with pets. Day of Surgery : Do not apply any lotions/deodorants the morning of surgery.  Please wear clean clothes to the hospital/surgery center.  FAILURE TO FOLLOW THESE INSTRUCTIONS MAY RESULT IN THE CANCELLATION OF YOUR SURGERY PATIENT SIGNATURE_________________________________  NURSE SIGNATURE__________________________________  ________________________________________________________________________

## 2019-10-14 NOTE — Progress Notes (Signed)
PCP - Linda Hedges, NP Cardiologist - Kirk Ruths, MD  PPM/ICD -  Device Orders -  Rep Notified -   Chest x-ray - 06-16-19 EKG - 06-16-19 Stress Test -  ECHO - 03-09-19 Cardiac Cath -   Sleep Study -  CPAP -   Fasting Blood Sugar -  Checks Blood Sugar _____ times a day  Blood Thinner Instructions: Eliquis  Aspirin Instructions:  ERAS Protcol - PRE-SURGERY Ensure or G2-   COVID TEST-    Anesthesia review:   Patient denies shortness of breath, fever, cough and chest pain at PAT appointment   All instructions explained to the patient, with a verbal understanding of the material. Patient agrees to go over the instructions while at home for a better understanding. Patient also instructed to self quarantine after being tested for COVID-19. The opportunity to ask questions was provided.

## 2019-10-17 ENCOUNTER — Encounter (HOSPITAL_COMMUNITY)
Admission: RE | Admit: 2019-10-17 | Discharge: 2019-10-17 | Disposition: A | Discharge status: Home / Self Care | Payer: Medicare (Managed Care) | Source: Ambulatory Visit | Attending: Urology | Admitting: Urology

## 2019-10-17 ENCOUNTER — Encounter (HOSPITAL_COMMUNITY): Payer: Self-pay

## 2019-10-17 ENCOUNTER — Other Ambulatory Visit: Payer: Self-pay

## 2019-10-17 ENCOUNTER — Encounter: Payer: Self-pay | Admitting: Cardiology

## 2019-10-18 NOTE — Progress Notes (Signed)
Received info on  patient.  [facility]ed [facility] forLlindsey Lilia Hill in regards to the following: 1. Who wil be providing transportation for pt to hospital for surgery.  2.  Will preop instructions need to be faxed for surgery on 10/31/19.  3..  How many hours a day is pt there at [facility].  4.  Does pt have a legal guardian or is he able to sign own consent.  If he has a legal guardian who that is.

## 2019-10-19 NOTE — Progress Notes (Addendum)
Preop instructions for:  Ian Hill                        Date of Birth  1950-11-11                          Date of Procedure: 10/31/2019         Doctor: DR Franchot Gallo  Time to arrive at [facility]: [id number] Report to: Admitting  Procedure:  Cystoscopy bilateral  retrograde pyelogram, possible bladder biopsy , cauterization of bleeders  Procedure Time: 0815am-0915am  Ambulatory surgery .  Will be returning home the same day.   COVID TEST WILL BE DONE ON ARRIVAL DAY OF SURGERY unless  [facility] could take pt for COVID TEST on 10/27/2019 to [facility] on [place], [place] between the hours of 0900am-300pm on that day.  It is a drive- thru [place].  Please call [phone] if [facility] is able to transport patient for COVID TESTING SO APPT CAN BE MADE FOR PATIENT  Do not eat or drink past midnight the night before your procedure.(To include any tube feedings-must be discontinued)    Take these morning medications only with sips of water.(or give through gastrostomy or feeding tube). Digoxin ( Lanoxin)  ( if takes in am ) , Finasteride ( Proscar)  (  if takes in am ) Myrbetriq( Mirabegron )  ( if takes in am ) , Tamsulosin ( Flomax)  ( if takes in am )  Note: No Insulin or Diabetic meds should be given or taken the morning of the procedure!   Facility contact: Fenton Malling [phone] or [phone]                 Phone:                  Health Care POA: [place] [phone] Transportation contact phone#: [facility] - [phone] Please send day of procedure:current med list and meds last taken that day, confirm nothing by mouth status from what time, Patient Demographic info( to include DNR status, problem list, allergies)   RN contact name/phone#:                             and Fax [phone]  [employer] card and picture ID Leave all jewelry and other valuables at place where living( no  metal or rings to be worn) No contact lens Women-no make-up, no lotions,perfumes,powders Men-no colognes,lotions  Any questions day of procedure,call  SHORT [phone]   Sent from :[facility] Pres[facility] Testing                   [place]                   [phone]  Sent by : Gillian Shields RN

## 2019-10-19 NOTE — Progress Notes (Signed)
Faxed preop instructions to [facility].  Spoke with sister [name] and she is aware that preop instructions are being faxed to [facility].

## 2019-10-19 NOTE — Progress Notes (Signed)
Fenton Malling from [facility] called back and LVMM stating that sister [name] was Guardian. Fax number for [facility] and [facility] would be providing transportation for pt, [facility] would like copy of preop instructions faxed to them and that PCP- Dr Sarajane Marek to draw CBC and BMP prior to surgery.

## 2019-10-20 NOTE — Telephone Encounter (Signed)
Selita from [facility] was calling to follow up on the status of this patient's surgical clearance

## 2019-10-24 NOTE — Progress Notes (Signed)
Called and LVMM for [facility] requesting labs ( ? Done on 10/21/19) be faxed to [phone].  [facility] called back and stated that patient had CBC done on 10/18/19 and will fax results.

## 2019-10-24 NOTE — Progress Notes (Signed)
ADDENDUM to Anesthesia Review- DR Stanford Breed to clear patient after receives CBC results.  CBC done 10/18/19.  Placed on chart. Called and LVMM for [facility] to see if clearance has been received from Dr Stanford Breed for surgery on 10/31/19.

## 2019-10-24 NOTE — Progress Notes (Signed)
CBC done 10/18/19 placed on chart.

## 2019-10-25 NOTE — Telephone Encounter (Signed)
Called and spoke with sister she states that [facility] has drawn labwork. Called Kennyth Lose [phone] to CB or fax results of labwork.Marland Kitchen

## 2019-10-25 NOTE — Telephone Encounter (Signed)
Follow Up:      Foster Simpson is calling to check on the status of pt's clearance. She needs this asap please. Please fax to [phone] today please.

## 2019-10-26 NOTE — Progress Notes (Signed)
Anesthesia Chart Review   Case: [id number] Date/Time: 10/31/19 0800   Procedures:      CYSTOSCOPY WITH RETROGRADE PYELOGRAM (Bilateral ) - 1 HR     CYSTOSCOPY WITH BIOPSY (N/A )   Anesthesia type: General   Pre-op diagnosis: GROSS HEMATURIA   Location: [place] [facility]   Surgeons: Franchot Gallo, MD      DISCUSSION:69 y.o. current some day smoker (25.5 pack years) with h/o CKD Stage II, HTN, CVA 2018, hypertrophic cardiomyopathy, a-fib (Eliquis), intermittent CHB (has declined PPM), bladder tumor, severe tricuspid regurgitation, anemia (chronic, stable), gross hematuria scheduled for above procedure 10/31/2019 with Dr. Franchot Gallo.   Pt last seen by cardiologist, Dr. Kirk Ruths, 09/09/2019.  Per telephone encounter 10/04/2019, "Pt should be considered high risk for any procedure; hold apixaban 2 days prior to procedure and resume after when ok with urology."  S/p TURB 06/16/2019 with no anesthesia complications.    Anticipate pt can proceed with planned procedure barring acute status change.   VS: Ht 5\' 6"  (1.676 m)   Wt 53.1 kg   BMI 18.88 kg/m   PROVIDERS: Linda Hedges, NP is PCP   Kirk Ruths, MD is Cardiologist  LABS: Labs reviewed: Acceptable for surgery. (all labs ordered are listed, but only abnormal results are displayed)  Labs Reviewed - No data to display   IMAGES:   EKG: 06/16/2019 Rate 86 bpm Atrial fibrillation with a competing junctional pacemaker Left ventricular hypertrophy Marked ST abnormality, possible anterolateral subendocardial injury Abnormal ECG No significant change since last tracing  CV: Echo 03/09/2019 IMPRESSIONS   1. Left ventricular ejection fraction, by visual estimation, is 60 to  65%. The left ventricle has normal function. Normal left ventricular size.  Left ventricular septal wall thickness was severely increased. Severely  increased left ventricular posterior  wall thickness. There is severely  increased left ventricular hypertrophy.  2. Left ventricular diastolic function could not be evaluated pattern of  LV diastolic filling.  3. Severe hypertrophy most prominent in the septum and apex.  4. Global right ventricle has normal systolic function.The right  ventricular size is mildly enlarged. No increase in right ventricular wall  thickness.  5. Left atrial size was moderately dilated.  6. Right atrial size was severely dilated.  7. The mitral valve is normal in structure. Mild mitral valve  regurgitation. No evidence of mitral stenosis.  8. The tricuspid valve is myxomatous. Tricuspid valve regurgitation  severe.  9. The aortic valve is normal in structure. Aortic valve regurgitation is  trivial by color flow Doppler. Structurally normal aortic valve, with no  evidence of sclerosis or stenosis.  10. There is Mild calcification of the aortic valve.  11. There is Moderate thickening of the aortic valve.  12. The pulmonic valve was normal in structure. Pulmonic valve  regurgitation is not visualized by color flow Doppler.  13. Moderately elevated pulmonary artery systolic pressure.  14. The inferior vena cava is dilated in size with <50% respiratory  variability, suggesting right atrial pressure of 15 mmHg.  Myocardial Perfusion 11/19/2016  There was no ST segment deviation noted during stress.  The study is normal.  This is a low risk study.   Normal resting and stress perfusion. No ischemia or infarction EF Not done due to PVCls TID borderline 1.2 Significant LVH  Past Medical History:  Diagnosis Date  . Anticoagulant long-term use    eliquis  . Bladder tumor   . BPH (benign prostatic hyperplasia)   .  CKD (chronic kidney disease), stage II   . Diverticulosis   . Dyslipidemia   . History of CVA with residual deficit 01/28/2017   acute right MCA infarc with M2 embolic occlusion w/p  tPA and mechnical thrombectomy---  residual left side weakness  . History  of external beam radiation therapy    03-23-2018  to 05-03-2018  prostate cancer  . Hypertension   . Hypertensive heart disease   . Hypertrophic cardiomegaly, apical variant   . Hypertrophic obstructive cardiomyopathy with diastolic heart failure [facility])    cardiologist-- dr Stanford Breed  . Mitral regurgitation   . Other problems related to education and literacy    can not read  . Prostate cancer [facility]) urologist-  dr dahlstedt/  oncologsit-- dr Tammi Klippel   dx 07/ 2018---  Stage T1a,  Gleason 4+3--- completed external radiation therapy 05-03-2018  . Stroke [facility]) 2019    Past Surgical History:  Procedure Laterality Date  . 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 CARDIOVASCULAR STRESS TEST  11-19-2016  dr Stanford Breed   Low risk nuclear study/  normal resting and stress perfusion with no ischemia or infarction/  EF not done due to PVCIs , TID borderline, 1.2 significant LVH  . CYSTOSCOPY WITH BIOPSY N/A 06/16/2019   Procedure: TRANSURETHERAL RESECTION OF BLADDER TUMOR.;  Surgeon: Franchot Gallo, MD;  Location: [facility];  Service: Urology;  Laterality: N/A;  30 MINS  . GOLD SEED IMPLANT N/A 03/10/2018   Procedure: GOLD SEED IMPLANT;  Surgeon: Franchot Gallo, MD;  Location: [facility];  Service: Urology;  Laterality: N/A;  . IR PERCUTANEOUS ART THROMBECTOMY/INFUSION INTRACRANIAL INC DIAG ANGIO  01/23/2017  . RADIOLOGY WITH ANESTHESIA N/A 01/23/2017   Procedure: RADIOLOGY WITH ANESTHESIA;  Surgeon: Luanne Bras, MD;  Location: [place];  Service: Radiology;  Laterality: N/A;  . SPACE OAR INSTILLATION N/A 03/10/2018   Procedure: SPACE OAR INSTILLATION;  Surgeon: Franchot Gallo, MD;  Location: [facility];  Service: Urology;  Laterality: N/A;  . TRANSTHORACIC ECHOCARDIOGRAM  11-20-2016   dr Stanford Breed   mild LVH,  apical hypertrophic cardiomyopathy, systolic function vigorous,  ef 65-70%,  grade 2 diastolic dysfunction/  mild  AR, MR, and  PR/  moderate LAE and RAE/  mild to mod. TR/  moderate increase PASP 42mmHg  . TRANSURETHRAL RESECTION OF BLADDER TUMOR WITH MITOMYCIN-C N/A 12/09/2018   Procedure: TRANSURETHRAL RESECTION OF BLADDER TUMOR WITH GEMCITABINE GIVEN IN PACU;  Surgeon: Franchot Gallo, MD;  Location: [facility];  Service: Urology;  Laterality: N/A;  . TRANSURETHRAL RESECTION OF PROSTATE N/A 11/24/2016   Procedure: TRANSURETHRAL RESECTION OF THE PROSTATE (TURP);  Surgeon: Franchot Gallo, MD;  Location: [facility];  Service: Urology;  Laterality: N/A;    MEDICATIONS: . apixaban (ELIQUIS) 5 MG TABS tablet  . CALCIUM CARBONATE-SIMETHICONE PO  . digoxin (LANOXIN) 0.125 MG tablet  . Ergocalciferol (VITAMIN D2) 50 MCG (2000 UT) TABS  . feeding supplement, ENSURE ENLIVE, (ENSURE ENLIVE) LIQD  . feeding supplement, ENSURE ENLIVE, (ENSURE ENLIVE) LIQD  . finasteride (PROSCAR) 5 MG tablet  . Iron-Vitamin C (VITRON-C) 65-125 MG TABS  . mirabegron ER (MYRBETRIQ) 25 MG TB24 tablet  . nicotine (NICODERM CQ - DOSED IN MG/24 HOURS) 14 mg/24hr patch  . nicotine polacrilex (NICORETTE) 2 MG gum  . phenazopyridine (PYRIDIUM) 200 MG tablet  . polyethylene glycol (MIRALAX / GLYCOLAX) packet  . rosuvastatin (CRESTOR) 10 MG tablet  . sennosides-docusate  sodium (SENOKOT-S) 8.6-50 MG tablet  . sodium chloride (OCEAN) 0.65 % SOLN nasal spray  . tamsulosin (FLOMAX) 0.4 MG CAPS capsule   No current facility-administered medications for this encounter.     Maia Plan [facility] Pre-Surgical Testing [phone] 10/26/19  12:11 PM

## 2019-10-26 NOTE — Anesthesia Preprocedure Evaluation (Addendum)
Anesthesia Evaluation  Patient identified by MRN, date of birth, ID band Patient awake    Reviewed: Allergy & Precautions, NPO status , Patient's Chart, lab work & pertinent test results  History of Anesthesia Complications Negative for: history of anesthetic complications  Airway Mallampati: II  TM Distance: >3 FB Neck ROM: Full    Dental  (+) Edentulous Upper, Edentulous Lower   Pulmonary Current Smoker and Patient abstained from smoking.,    Pulmonary exam normal        Cardiovascular hypertension, Normal cardiovascular exam+ dysrhythmias Atrial Fibrillation + Valvular Problems/Murmurs    Apical hypertrophic CM  '20 TTE - EF 60 to 65%. Left ventricular septal wall thickness was severely increased. Severely increased left ventricular posterior wall thickness. Severe hypertrophy most prominent in the septum and apex. RV size is mildly enlarged. Left atrial size was moderately dilated. Right atrial size was severely dilated. Mild mitral valve regurgitation. Tricuspid valve regurgitation severe. Aortic valve regurgitation is trivial by color flow Doppler. Moderately elevated pulmonary artery systolic pressure  Intermittent CHB - declined PPM   Pt last seen by cardiologist, Dr. Kirk Ruths, 09/09/2019.  Per telephone encounter 10/04/2019, "Pt should be considered high risk for any procedure; hold apixaban 2 days prior to procedure and resume after when ok with urology."    Neuro/Psych CVA, No Residual Symptoms negative psych ROS   GI/Hepatic negative GI ROS, Neg liver ROS,   Endo/Other  negative endocrine ROS  Renal/GU CRFRenal disease    Hx prostate and bladder cancer     Musculoskeletal negative musculoskeletal ROS (+)   Abdominal   Peds  Hematology  On eliquis, last dose 4 days ago     Anesthesia Other Findings Covid test day of  Reproductive/Obstetrics                            Anesthesia Physical Anesthesia Plan  ASA: IV  Anesthesia Plan: General   Post-op Pain Management:    Induction: Intravenous  PONV Risk Score and Plan: 2 and Treatment may vary due to age or medical condition and Ondansetron  Airway Management Planned: LMA  Additional Equipment: None  Intra-op Plan:   Post-operative Plan: Extubation in OR  Informed Consent: I have reviewed the patients History and Physical, chart, labs and discussed the procedure including the risks, benefits and alternatives for the proposed anesthesia with the patient or authorized representative who has indicated his/her understanding and acceptance.     Dental advisory given and Consent reviewed with POA  Plan Discussed with: CRNA and Anesthesiologist  Anesthesia Plan Comments:       Anesthesia Quick Evaluation

## 2019-10-26 NOTE — Progress Notes (Signed)
Requested on 6/15 and 10/26/19 the guardian paperwork for patient.  LVMM for Fenton Malling at [place].

## 2019-10-28 NOTE — Progress Notes (Signed)
LVMM for Fenton Malling at [place] to confirm transportation arrangements are confirmed and that [facility] has copy of the preop instructions for patient's surgery on 10/31/19.  Spoke with sister, [name] and Gregary Signs stated that [facility] would be picking them up at 0500am on 10/31/19. [name] stated that she has preop instructions.    Gregary Signs also stated that CNA was at the house today and looked over the preop instructions and sister stated she  is aware patient is to have no Eliquis on 6/19/ and 6/20/and 10/31/19 .        [name] stated that nurse from office of Dr Stanford Breed( card) office had called her and stated per Dr Stanford Breed that patient is high risk for surgery.  [name] stated that Dr Diona Fanti had not told her that and she has called Dr Dahlstedt's office and awaiting return call in regards to this.       [name] stated she is caregiver for her brother and takes care of all of his affairs but there is no official paperwork in regards to this.  [name] stated that she has received paperwork but it has not yet been completed.

## 2019-10-28 NOTE — Telephone Encounter (Signed)
   Primary Cardiologist: Kirk Ruths, MD  Chart reviewed as part of pre-operative protocol coverage. Patient was contacted 10/28/2019 in reference to pre-operative risk assessment for pending surgery as outlined below.  Ian Hill was last seen on 09/09/19 by Dr. Stanford Breed.  Since that day, Ian Hill has done well. I was able to obtain recent CBC results and H/H appear stable and improved from Feb 2021. He reports hematuria has stopped and denies any other bleeding. He understands that given his co-morbid conditions, he is at higher risk for cardiac complications for any procedure and wishes to proceed. I spoke with both the patient and his sister, Ian Hill. They are both in agreement to accept the risk and proceed.  He has been instructed to hold eliquis for two days, per Dr. Stanford Breed. Last dose will be 10/28/19, evening dose. Please resume as soon as safe to do so, per the surgeon.  Therefore, based on ACC/AHA guidelines, the patient would be at acceptable risk for the planned procedure without further cardiovascular testing.   I will route this recommendation to the requesting party via Epic fax function and remove from pre-op pool. Please call with questions.  Tami Lin Kahealani Yankovich, PA 10/28/2019, 8:46 AM

## 2019-10-28 NOTE — Telephone Encounter (Signed)
CBC by [facility], per Kennyth Lose [phone])  10/17/19: WBC 4.6 RBC 4.32 Hb 11.4 Hct 37.8 MCV 88 HCH 26.4 MCHC 30.2 RDW 16.2 PLT 245   08/04/19:  Hb 12.2 HCT 40.3 PLT 170

## 2019-10-28 NOTE — Telephone Encounter (Signed)
Clearance has been faxed to Dr. Diona Fanti. I will remove from the pre op call back pool.

## 2019-10-31 ENCOUNTER — Ambulatory Visit (HOSPITAL_COMMUNITY): Payer: Medicare (Managed Care) | Admitting: Physician Assistant

## 2019-10-31 ENCOUNTER — Ambulatory Visit (HOSPITAL_COMMUNITY): Payer: Medicare (Managed Care) | Admitting: Anesthesiology

## 2019-10-31 ENCOUNTER — Observation Stay (HOSPITAL_COMMUNITY)
Admission: RE | Admit: 2019-10-31 | Discharge: 2019-11-01 | Disposition: A | Discharge status: Home / Self Care | Payer: Medicare (Managed Care) | Attending: Urology | Admitting: Urology

## 2019-10-31 ENCOUNTER — Other Ambulatory Visit: Payer: Self-pay

## 2019-10-31 ENCOUNTER — Encounter (HOSPITAL_COMMUNITY): Payer: Self-pay | Admitting: Urology

## 2019-10-31 ENCOUNTER — Ambulatory Visit (HOSPITAL_COMMUNITY): Payer: Medicare (Managed Care)

## 2019-10-31 ENCOUNTER — Observation Stay (HOSPITAL_COMMUNITY): Payer: Medicare (Managed Care)

## 2019-10-31 ENCOUNTER — Encounter (HOSPITAL_COMMUNITY)
Admission: RE | Disposition: A | Discharge status: Home / Self Care | Payer: Self-pay | Source: Home / Self Care | Attending: Urology

## 2019-10-31 DIAGNOSIS — W908XXA Exposure to other nonionizing radiation, initial encounter: Secondary | ICD-10-CM | POA: Diagnosis not present

## 2019-10-31 DIAGNOSIS — Z87440 Personal history of urinary (tract) infections: Secondary | ICD-10-CM | POA: Insufficient documentation

## 2019-10-31 DIAGNOSIS — E785 Hyperlipidemia, unspecified: Secondary | ICD-10-CM | POA: Insufficient documentation

## 2019-10-31 DIAGNOSIS — I130 Hypertensive heart and chronic kidney disease with heart failure and stage 1 through stage 4 chronic kidney disease, or unspecified chronic kidney disease: Secondary | ICD-10-CM | POA: Diagnosis not present

## 2019-10-31 DIAGNOSIS — N3041 Irradiation cystitis with hematuria: Secondary | ICD-10-CM | POA: Diagnosis not present

## 2019-10-31 DIAGNOSIS — Z8551 Personal history of malignant neoplasm of bladder: Secondary | ICD-10-CM | POA: Diagnosis not present

## 2019-10-31 DIAGNOSIS — N182 Chronic kidney disease, stage 2 (mild): Secondary | ICD-10-CM | POA: Diagnosis not present

## 2019-10-31 DIAGNOSIS — N400 Enlarged prostate without lower urinary tract symptoms: Secondary | ICD-10-CM | POA: Diagnosis not present

## 2019-10-31 DIAGNOSIS — Z79899 Other long term (current) drug therapy: Secondary | ICD-10-CM | POA: Insufficient documentation

## 2019-10-31 DIAGNOSIS — I5030 Unspecified diastolic (congestive) heart failure: Secondary | ICD-10-CM | POA: Insufficient documentation

## 2019-10-31 DIAGNOSIS — I422 Other hypertrophic cardiomyopathy: Secondary | ICD-10-CM | POA: Insufficient documentation

## 2019-10-31 DIAGNOSIS — I4891 Unspecified atrial fibrillation: Secondary | ICD-10-CM | POA: Diagnosis not present

## 2019-10-31 DIAGNOSIS — Z20822 Contact with and (suspected) exposure to covid-19: Secondary | ICD-10-CM | POA: Diagnosis not present

## 2019-10-31 DIAGNOSIS — Z888 Allergy status to other drugs, medicaments and biological substances status: Secondary | ICD-10-CM | POA: Diagnosis not present

## 2019-10-31 DIAGNOSIS — Z7901 Long term (current) use of anticoagulants: Secondary | ICD-10-CM | POA: Insufficient documentation

## 2019-10-31 DIAGNOSIS — Z8546 Personal history of malignant neoplasm of prostate: Secondary | ICD-10-CM | POA: Diagnosis not present

## 2019-10-31 DIAGNOSIS — I69354 Hemiplegia and hemiparesis following cerebral infarction affecting left non-dominant side: Secondary | ICD-10-CM | POA: Diagnosis not present

## 2019-10-31 DIAGNOSIS — F17210 Nicotine dependence, cigarettes, uncomplicated: Secondary | ICD-10-CM | POA: Insufficient documentation

## 2019-10-31 DIAGNOSIS — Z809 Family history of malignant neoplasm, unspecified: Secondary | ICD-10-CM | POA: Diagnosis not present

## 2019-10-31 DIAGNOSIS — C678 Malignant neoplasm of overlapping sites of bladder: Secondary | ICD-10-CM | POA: Diagnosis present

## 2019-10-31 HISTORY — PX: CYSTOSCOPY W/ RETROGRADES: SHX1426

## 2019-10-31 HISTORY — PX: CYSTOSCOPY WITH BIOPSY: SHX5122

## 2019-10-31 LAB — BASIC METABOLIC PANEL
Anion gap: 11 (ref 5–15)
BUN: 24 mg/dL — ABNORMAL HIGH (ref 8–23)
CO2: 23 mmol/L (ref 22–32)
Calcium: 9.1 mg/dL (ref 8.9–10.3)
Chloride: 108 mmol/L (ref 98–111)
Creatinine, Ser: 1.38 mg/dL — ABNORMAL HIGH (ref 0.61–1.24)
GFR calc Af Amer: 60 mL/min — ABNORMAL LOW (ref >60)
GFR calc non Af Amer: 51 mL/min — ABNORMAL LOW (ref >60)
Glucose, Bld: 84 mg/dL (ref 70–99)
Potassium: 5.1 mmol/L (ref 3.5–5.1)
Sodium: 142 mmol/L (ref 135–145)

## 2019-10-31 LAB — SARS CORONAVIRUS 2 BY RT PCR (HOSPITAL ORDER, PERFORMED IN ~~LOC~~ HOSPITAL LAB): SARS Coronavirus 2: NEGATIVE

## 2019-10-31 SURGERY — CYSTOSCOPY, WITH RETROGRADE PYELOGRAM
Anesthesia: General

## 2019-10-31 MED ORDER — HYDROCODONE-ACETAMINOPHEN 5-325 MG PO TABS
1.0000 | ORAL_TABLET | ORAL | Status: DC | PRN
  Administered 2019-10-31: 2 via ORAL
  Filled 2019-10-31: qty 2

## 2019-10-31 MED ORDER — SODIUM CHLORIDE 0.9 % IV SOLN
INTRAVENOUS | Status: DC | PRN
  Administered 2019-10-31: 3 mL

## 2019-10-31 MED ORDER — DEXAMETHASONE SODIUM PHOSPHATE 10 MG/ML IJ SOLN
INTRAMUSCULAR | Status: AC
  Filled 2019-10-31: qty 1

## 2019-10-31 MED ORDER — STERILE WATER FOR IRRIGATION IR SOLN
Status: DC | PRN
  Administered 2019-10-31: 12000 mL via INTRAVESICAL

## 2019-10-31 MED ORDER — LIDOCAINE 2% (20 MG/ML) 5 ML SYRINGE
INTRAMUSCULAR | Status: DC | PRN
  Administered 2019-10-31: 40 mg via INTRAVENOUS

## 2019-10-31 MED ORDER — IOHEXOL 9 MG/ML PO SOLN
500.0000 mL | ORAL | Status: AC
  Administered 2019-10-31 (×2): 500 mL via ORAL

## 2019-10-31 MED ORDER — ONDANSETRON HCL 4 MG/2ML IJ SOLN: 4.0000 mg | Freq: Once | INTRAMUSCULAR | Status: DC | PRN

## 2019-10-31 MED ORDER — ORAL CARE MOUTH RINSE
15.0000 mL | Freq: Once | OROMUCOSAL | Status: AC
  Administered 2019-10-31: 15 mL via OROMUCOSAL

## 2019-10-31 MED ORDER — ROSUVASTATIN CALCIUM 10 MG PO TABS
10.0000 mg | ORAL_TABLET | Freq: Every day | ORAL | Status: DC
  Administered 2019-10-31 – 2019-11-01 (×2): 10 mg via ORAL
  Filled 2019-10-31 (×2): qty 1

## 2019-10-31 MED ORDER — MIRABEGRON ER 25 MG PO TB24
25.0000 mg | ORAL_TABLET | Freq: Every day | ORAL | Status: DC
  Administered 2019-10-31 – 2019-11-01 (×2): 25 mg via ORAL
  Filled 2019-10-31 (×2): qty 1

## 2019-10-31 MED ORDER — FINASTERIDE 5 MG PO TABS
5.0000 mg | ORAL_TABLET | Freq: Every day | ORAL | Status: DC
  Administered 2019-10-31 – 2019-11-01 (×2): 5 mg via ORAL
  Filled 2019-10-31 (×2): qty 1

## 2019-10-31 MED ORDER — OXYBUTYNIN CHLORIDE 5 MG PO TABS
5.0000 mg | ORAL_TABLET | Freq: Three times a day (TID) | ORAL | Status: DC | PRN
  Administered 2019-10-31 (×2): 5 mg via ORAL
  Filled 2019-10-31 (×2): qty 1

## 2019-10-31 MED ORDER — PROPOFOL 10 MG/ML IV BOLUS
INTRAVENOUS | Status: AC
  Filled 2019-10-31: qty 20

## 2019-10-31 MED ORDER — FENTANYL CITRATE (PF) 100 MCG/2ML IJ SOLN
INTRAMUSCULAR | Status: DC | PRN
  Administered 2019-10-31: 50 ug via INTRAVENOUS
  Administered 2019-10-31: 25 ug via INTRAVENOUS
  Administered 2019-10-31: 50 ug via INTRAVENOUS
  Administered 2019-10-31: 25 ug via INTRAVENOUS
  Administered 2019-10-31: 50 ug via INTRAVENOUS

## 2019-10-31 MED ORDER — CEFAZOLIN SODIUM-DEXTROSE 1-4 GM/50ML-% IV SOLN
1.0000 g | Freq: Three times a day (TID) | INTRAVENOUS | Status: DC
  Administered 2019-10-31 – 2019-11-01 (×3): 1 g via INTRAVENOUS
  Filled 2019-10-31 (×4): qty 50

## 2019-10-31 MED ORDER — SENNOSIDES-DOCUSATE SODIUM 8.6-50 MG PO TABS: 2.0000 | ORAL_TABLET | Freq: Every evening | ORAL | Status: DC | PRN

## 2019-10-31 MED ORDER — SODIUM CHLORIDE 0.9 % IR SOLN
Status: DC | PRN
  Administered 2019-10-31: 6000 mL via INTRAVESICAL

## 2019-10-31 MED ORDER — FENTANYL CITRATE (PF) 100 MCG/2ML IJ SOLN
INTRAMUSCULAR | Status: AC
  Filled 2019-10-31: qty 2

## 2019-10-31 MED ORDER — SODIUM CHLORIDE (PF) 0.9 % IJ SOLN
INTRAMUSCULAR | Status: AC
  Filled 2019-10-31: qty 50

## 2019-10-31 MED ORDER — SALINE SPRAY 0.65 % NA SOLN
1.0000 | Freq: Four times a day (QID) | NASAL | Status: DC | PRN
  Filled 2019-10-31: qty 44

## 2019-10-31 MED ORDER — PROPOFOL 10 MG/ML IV BOLUS
INTRAVENOUS | Status: DC | PRN
  Administered 2019-10-31: 100 mg via INTRAVENOUS
  Administered 2019-10-31 (×2): 20 mg via INTRAVENOUS

## 2019-10-31 MED ORDER — LACTATED RINGERS IV SOLN: INTRAVENOUS | Status: DC

## 2019-10-31 MED ORDER — FENTANYL CITRATE (PF) 100 MCG/2ML IJ SOLN
25.0000 ug | INTRAMUSCULAR | Status: DC | PRN
  Administered 2019-10-31 (×3): 25 ug via INTRAVENOUS
  Administered 2019-10-31: 50 ug via INTRAVENOUS
  Administered 2019-10-31: 25 ug via INTRAVENOUS

## 2019-10-31 MED ORDER — PHENYLEPHRINE 40 MCG/ML (10ML) SYRINGE FOR IV PUSH (FOR BLOOD PRESSURE SUPPORT)
PREFILLED_SYRINGE | INTRAVENOUS | Status: DC | PRN
  Administered 2019-10-31 (×2): 80 ug via INTRAVENOUS
  Administered 2019-10-31: 40 ug via INTRAVENOUS
  Administered 2019-10-31: 80 ug via INTRAVENOUS

## 2019-10-31 MED ORDER — TAMSULOSIN HCL 0.4 MG PO CAPS
0.4000 mg | ORAL_CAPSULE | Freq: Every day | ORAL | Status: DC
  Administered 2019-10-31 – 2019-11-01 (×2): 0.4 mg via ORAL
  Filled 2019-10-31 (×2): qty 1

## 2019-10-31 MED ORDER — ONDANSETRON HCL 4 MG/2ML IJ SOLN
INTRAMUSCULAR | Status: DC | PRN
  Administered 2019-10-31: 4 mg via INTRAVENOUS

## 2019-10-31 MED ORDER — POLYETHYLENE GLYCOL 3350 17 G PO PACK
17.0000 g | PACK | Freq: Every day | ORAL | Status: DC
  Administered 2019-10-31 – 2019-11-01 (×2): 17 g via ORAL
  Filled 2019-10-31 (×2): qty 1

## 2019-10-31 MED ORDER — NICOTINE 14 MG/24HR TD PT24
14.0000 mg | MEDICATED_PATCH | Freq: Every day | TRANSDERMAL | Status: DC
  Administered 2019-10-31 – 2019-11-01 (×2): 14 mg via TRANSDERMAL
  Filled 2019-10-31 (×2): qty 1

## 2019-10-31 MED ORDER — LIDOCAINE 2% (20 MG/ML) 5 ML SYRINGE
INTRAMUSCULAR | Status: AC
  Filled 2019-10-31: qty 5

## 2019-10-31 MED ORDER — OXYCODONE HCL 5 MG/5ML PO SOLN: 5.0000 mg | Freq: Once | ORAL | Status: DC | PRN

## 2019-10-31 MED ORDER — CEFAZOLIN SODIUM-DEXTROSE 2-4 GM/100ML-% IV SOLN
2.0000 g | INTRAVENOUS | Status: AC
  Administered 2019-10-31: 2 g via INTRAVENOUS
  Filled 2019-10-31: qty 100

## 2019-10-31 MED ORDER — OXYCODONE HCL 5 MG PO TABS: 5.0000 mg | ORAL_TABLET | Freq: Once | ORAL | Status: DC | PRN

## 2019-10-31 MED ORDER — ONDANSETRON HCL 4 MG/2ML IJ SOLN
INTRAMUSCULAR | Status: AC
  Filled 2019-10-31: qty 2

## 2019-10-31 MED ORDER — IOHEXOL 300 MG/ML  SOLN
100.0000 mL | Freq: Once | INTRAMUSCULAR | Status: AC | PRN
  Administered 2019-10-31: 100 mL via INTRAVENOUS

## 2019-10-31 MED ORDER — ONDANSETRON HCL 4 MG/2ML IJ SOLN: 4.0000 mg | INTRAMUSCULAR | Status: DC | PRN

## 2019-10-31 MED ORDER — IOHEXOL 9 MG/ML PO SOLN
ORAL | Status: AC
  Filled 2019-10-31: qty 1000

## 2019-10-31 MED ORDER — CHLORHEXIDINE GLUCONATE 0.12 % MT SOLN: 15.0000 mL | Freq: Once | OROMUCOSAL | Status: AC

## 2019-10-31 MED ORDER — SODIUM CHLORIDE 0.45 % IV SOLN: INTRAVENOUS | Status: DC

## 2019-10-31 MED ORDER — DIGOXIN 125 MCG PO TABS
0.1250 mg | ORAL_TABLET | Freq: Every day | ORAL | Status: DC
  Administered 2019-10-31 – 2019-11-01 (×2): 0.125 mg via ORAL
  Filled 2019-10-31 (×2): qty 1

## 2019-10-31 SURGICAL SUPPLY — 29 items
BAG URINE DRAIN 2000ML AR STRL (UROLOGICAL SUPPLIES) IMPLANT
BAG URO CATCHER STRL LF (MISCELLANEOUS) ×4 IMPLANT
CATH FOLEY 2WAY SLVR  5CC 20FR (CATHETERS) ×4
CATH FOLEY 2WAY SLVR 30CC 24FR (CATHETERS) IMPLANT
CATH FOLEY 2WAY SLVR 5CC 20FR (CATHETERS) ×2 IMPLANT
CATH INTERMIT  6FR 70CM (CATHETERS) ×4 IMPLANT
CATH URET 5FR 28IN CONE TIP (BALLOONS) ×4
CATH URET 5FR 70CM CONE TIP (BALLOONS) ×2 IMPLANT
CLOTH BEACON ORANGE TIMEOUT ST (SAFETY) ×4 IMPLANT
COVER WAND RF STERILE (DRAPES) IMPLANT
ELECT REM PT RETURN 15FT ADLT (MISCELLANEOUS) ×4 IMPLANT
EVACUATOR MICROVAS BLADDER (UROLOGICAL SUPPLIES) IMPLANT
GLOVE BIOGEL M 8.0 STRL (GLOVE) ×4 IMPLANT
GOWN STRL REUS W/ TWL XL LVL3 (GOWN DISPOSABLE) ×2 IMPLANT
GOWN STRL REUS W/TWL XL LVL3 (GOWN DISPOSABLE) ×8 IMPLANT
GUIDEWIRE STR DUAL SENSOR (WIRE) ×8 IMPLANT
KIT TURNOVER KIT A (KITS) IMPLANT
LOOP MONOPOLAR YLW (ELECTROSURGICAL) ×4 IMPLANT
MANIFOLD NEPTUNE II (INSTRUMENTS) ×4 IMPLANT
NDL SAFETY ECLIPSE 18X1.5 (NEEDLE) ×2 IMPLANT
NEEDLE HYPO 18GX1.5 SHARP (NEEDLE) ×4
PENCIL SMOKE EVACUATOR (MISCELLANEOUS) IMPLANT
SYR TOOMEY IRRIG 70ML (MISCELLANEOUS)
SYRINGE TOOMEY IRRIG 70ML (MISCELLANEOUS) IMPLANT
TRAY CYSTO PACK (CUSTOM PROCEDURE TRAY) ×4 IMPLANT
TUBING CONNECTING 10 (TUBING) ×3 IMPLANT
TUBING CONNECTING 10' (TUBING) ×1
TUBING UROLOGY SET (TUBING) ×4 IMPLANT
WATER STERILE IRR 3000ML UROMA (IV SOLUTION) ×4 IMPLANT

## 2019-10-31 NOTE — Transfer of Care (Signed)
Immediate Anesthesia Transfer of Care Note  Patient: Ian Hill  Procedure(s) Performed: CYSTOSCOPY WITH ATTEMPTED RETROGRADE PYELOGRAM (Bilateral ) TURBT (N/A )  Patient Location: PACU  Anesthesia Type:General  Level of Consciousness: sedated, patient cooperative and responds to stimulation  Airway & Oxygen Therapy: Patient Spontanous Breathing and Patient connected to face mask oxygen  Post-op Assessment: Report given to RN and Post -op Vital signs reviewed and stable  Post vital signs: Reviewed and stable  Last Vitals:  Vitals Value Taken Time  BP 126/88 10/31/19 0945  Temp    Pulse    Resp    SpO2    Vitals shown include unvalidated device data.  Last Pain:  Vitals:   10/31/19 0629  TempSrc: Oral      Patients Stated Pain Goal: 3 (75/10/25 8527)  Complications: No complications documented.

## 2019-10-31 NOTE — Anesthesia Procedure Notes (Addendum)
Procedure Name: LMA Insertion Date/Time: 10/31/2019 8:36 AM Performed by: Marlene Lard, RN Pre-anesthesia Checklist: Patient identified, Emergency Drugs available, Suction available and Patient being monitored Patient Re-evaluated:Patient Re-evaluated prior to induction Oxygen Delivery Method: Circle system utilized Preoxygenation: Pre-oxygenation with 100% oxygen Induction Type: IV induction Ventilation: Mask ventilation without difficulty LMA: LMA inserted LMA Size: 4.0 Number of attempts: 1 Placement Confirmation: positive ETCO2 and breath sounds checked- equal and bilateral Tube secured with: Tape Dental Injury: Teeth and Oropharynx as per pre-operative assessment  Comments: Performed by Fernanda Drum

## 2019-10-31 NOTE — H&P (Signed)
H&P  Chief Complaint: Gross hematuria  History of Present Illness: 69 year old male presents for repeat cystoscopy, possible TURBT.  He underwent TURBT on 2.5.2021 with pathology showing high-grade nonmuscle invasive bladder cancer.  He also has radiation-induced hemorrhagic cystitis.  He had a quiescent episode with his gross hematuria after this resection, but has had persistent gross hematuria since that time.  [name] on Eliquis which no doubt exacerbates his hematuria.  Past Medical History:  Diagnosis Date  . Anticoagulant long-term use    eliquis  . Bladder tumor   . BPH (benign prostatic hyperplasia)   . CKD (chronic kidney disease), stage II   . Diverticulosis   . Dyslipidemia   . History of CVA with residual deficit 01/28/2017   acute right MCA infarc with M2 embolic occlusion w/p  tPA and mechnical thrombectomy---  residual left side weakness  . History of external beam radiation therapy    03-23-2018  to 05-03-2018  prostate cancer  . Hypertension   . Hypertensive heart disease   . Hypertrophic cardiomegaly, apical variant   . Hypertrophic obstructive cardiomyopathy with diastolic heart failure [facility])    cardiologist-- dr Stanford Breed  . Mitral regurgitation   . Other problems related to education and literacy    can not read  . Prostate cancer [facility]) urologist-  dr Tomas Schamp/  oncologsit-- dr Tammi Klippel   dx 07/ 2018---  Stage T1a,  Gleason 4+3--- completed external radiation therapy 05-03-2018  . Stroke [facility]) 2019    Past Surgical History:  Procedure Laterality Date  . 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 CARDIOVASCULAR STRESS TEST  11-19-2016  dr Stanford Breed   Low risk nuclear study/  normal resting and stress perfusion with no ischemia or infarction/  EF not done due to PVCIs , TID borderline, 1.2 significant LVH  . CYSTOSCOPY WITH BIOPSY N/A 06/16/2019   Procedure: TRANSURETHERAL RESECTION OF BLADDER TUMOR.;  Surgeon: Franchot Gallo, MD;  Location: [facility];  Service: Urology;  Laterality: N/A;  30 MINS  . GOLD SEED IMPLANT N/A 03/10/2018   Procedure: GOLD SEED IMPLANT;  Surgeon: Franchot Gallo, MD;  Location: [facility];  Service: Urology;  Laterality: N/A;  . IR PERCUTANEOUS ART THROMBECTOMY/INFUSION INTRACRANIAL INC DIAG ANGIO  01/23/2017  . RADIOLOGY WITH ANESTHESIA N/A 01/23/2017   Procedure: RADIOLOGY WITH ANESTHESIA;  Surgeon: Luanne Bras, MD;  Location: [place];  Service: Radiology;  Laterality: N/A;  . SPACE OAR INSTILLATION N/A 03/10/2018   Procedure: SPACE OAR INSTILLATION;  Surgeon: Franchot Gallo, MD;  Location: [facility];  Service: Urology;  Laterality: N/A;  . TRANSTHORACIC ECHOCARDIOGRAM  11-20-2016   dr Stanford Breed   mild LVH,  apical hypertrophic cardiomyopathy, systolic function vigorous,  ef 65-70%,  grade 2 diastolic dysfunction/  mild AR, MR, and  PR/  moderate LAE and RAE/  mild to mod. TR/  moderate increase PASP 62mmHg  . TRANSURETHRAL RESECTION OF BLADDER TUMOR WITH MITOMYCIN-C N/A 12/09/2018   Procedure: TRANSURETHRAL RESECTION OF BLADDER TUMOR WITH GEMCITABINE GIVEN IN PACU;  Surgeon: Franchot Gallo, MD;  Location: [facility];  Service: Urology;  Laterality: N/A;  . TRANSURETHRAL RESECTION OF PROSTATE N/A 11/24/2016   Procedure: TRANSURETHRAL RESECTION OF THE PROSTATE (TURP);  Surgeon: Franchot Gallo, MD;  Location[facility];  Service: Urology;  Laterality: N/A;    Home Medications:    Allergies:  Allergies  Allergen Reactions  . Feraheme [Ferumoxytol] Anaphylaxis  Family History  Problem Relation Age of Onset  . Cancer Paternal Aunt        unknown    Social History:  reports that he has been smoking cigarettes. He has a 25.50 pack-year smoking history. He has never used smokeless tobacco. He reports that he does not drink alcohol and does not use drugs.  ROS: A complete review of systems was  performed.  All systems are negative except for pertinent findings as noted.  Physical Exam:  Vital signs in last 24 hours: BP (!) 166/86   Pulse 97   Temp 98.1 F (36.7 C) (Oral)   Resp 18   Ht 5\' 6"  (1.676 m)   Wt 53.1 kg   SpO2 99%   BMI 18.88 kg/m  Constitutional:  Alert and oriented, No acute distress Cardiovascular: Regular rate  Respiratory: Normal respiratory effort GI: Abdomen is soft, nontender, nondistended, no abdominal masses. No CVAT.  Genitourinary: Normal male phallus, testes are descended bilaterally and non-tender and without masses, scrotum is normal in appearance without lesions or masses, perineum is normal on inspection. Lymphatic: No lymphadenopathy Neurologic: Grossly intact, no focal deficits Psychiatric: Normal mood and affect  Laboratory Data:  No results for input(s): WBC, HGB, HCT, PLT in the last 72 hours.  Recent Labs    10/31/19 0612  NA 142  K 5.1  CL 108  GLUCOSE 84  BUN 24*  CALCIUM 9.1  CREATININE 1.38*     Results for orders placed or performed during the hospital encounter of 10/31/19 (from the past 24 hour(s))  Basic metabolic panel     Status: Abnormal   Collection Time: 10/31/19  6:12 AM  Result Value Ref Range   Sodium 142 135 - 145 mmol/L   Potassium 5.1 3.5 - 5.1 mmol/L   Chloride 108 98 - 111 mmol/L   CO2 23 22 - 32 mmol/L   Glucose, Bld 84 70 - 99 mg/dL   BUN 24 (H) 8 - 23 mg/dL   Creatinine, Ser 1.38 (H) 0.61 - 1.24 mg/dL   Calcium 9.1 8.9 - 10.3 mg/dL   GFR calc non Af Amer 51 (L) >60 mL/min   GFR calc Af Amer 60 (L) >60 mL/min   Anion gap 11 5 - 15  SARS Coronavirus 2 by RT PCR (hospital order, performed in [place] hospital lab) Nasopharyngeal Nasopharyngeal Swab     Status: None   Collection Time: 10/31/19  6:34 AM   Specimen: Nasopharyngeal Swab  Result Value Ref Range   SARS Coronavirus 2 NEGATIVE NEGATIVE   Recent Results (from the past 240 hour(s))  SARS Coronavirus 2 by RT PCR (hospital order,  performed in [place] hospital lab) Nasopharyngeal Nasopharyngeal Swab     Status: None   Collection Time: 10/31/19  6:34 AM   Specimen: Nasopharyngeal Swab  Result Value Ref Range Status   SARS Coronavirus 2 NEGATIVE NEGATIVE Final    Comment: (NOTE) SARS-CoV-2 target nucleic acids are NOT DETECTED.  The SARS-CoV-2 RNA is generally detectable in upper and lower respiratory specimens during the acute phase of infection. The lowest concentration of SARS-CoV-2 viral copies this assay can detect is 250 copies / mL. A negative result does not preclude SARS-CoV-2 infection and should not be used as the sole basis for treatment or other patient management decisions.  A negative result may occur with improper specimen collection / handling, submission of specimen other than nasopharyngeal swab, presence of viral mutation(s) within the areas targeted by this assay, and  inadequate number of viral copies (<250 copies / mL). A negative result must be combined with clinical observations, patient history, and epidemiological information.  Fact Sheet for Patients:   StrictlyIdeas.no  Fact Sheet for Healthcare Providers: BankingDealers.co.za  This test is not yet approved or  cleared by the [place] FDA and has been authorized for detection and/or diagnosis of SARS-CoV-2 by FDA under an Emergency Use Authorization (EUA).  This EUA will remain in effect (meaning this test can be used) for the duration of the COVID-19 declaration under Section 564(b)(1) of the Act, 21 U.S.C. section 360bbb-3(b)(1), unless the authorization is terminated or revoked sooner.  Performed at [facility], [place] [address], [place], [place] [address]     Renal Function: Recent Labs    10/31/19 0612  CREATININE 1.38*   Estimated Creatinine Clearance: 37.4 mL/min (A) (by C-G formula based on SCr of 1.38 mg/dL (H)).  Radiologic Imaging: No  results found.  Impression/Assessment:  History of bladder cancer with persistent gross hematuria  Plan:  Cystoscopy, possible bladder biopsy, cauterization

## 2019-10-31 NOTE — Anesthesia Postprocedure Evaluation (Signed)
Anesthesia Post Note  Patient: Ian Hill  Procedure(s) Performed: CYSTOSCOPY WITH ATTEMPTED RETROGRADE PYELOGRAM (Bilateral ) TURBT (N/A )     Patient location during evaluation: PACU Anesthesia Type: General Level of consciousness: awake and alert Pain management: pain level controlled Vital Signs Assessment: post-procedure vital signs reviewed and stable Respiratory status: spontaneous breathing, nonlabored ventilation and respiratory function stable Cardiovascular status: blood pressure returned to baseline and stable Postop Assessment: no apparent nausea or vomiting Anesthetic complications: no   No complications documented.  Last Vitals:  Vitals:   10/31/19 1122 10/31/19 1124  BP: (!) 158/75 (!) 158/75  Pulse: 100 98  Resp: 18   Temp: 36.8 C 36.8 C  SpO2: 97% 98%    Last Pain:  Vitals:   10/31/19 1124  TempSrc: Oral  PainSc:                  Audry Pili

## 2019-10-31 NOTE — Interval H&P Note (Signed)
History and Physical Interval Note:  10/31/2019 8:25 AM  Ian Hill  has presented today for surgery, with the diagnosis of GROSS HEMATURIA.  The various methods of treatment have been discussed with the patient and family. After consideration of risks, benefits and other options for treatment, the patient has consented to  Procedure(s) with comments: CYSTOSCOPY WITH RETROGRADE PYELOGRAM (Bilateral) - 1 HR CYSTOSCOPY WITH BIOPSY (N/A) as a surgical intervention.  The patient's history has been reviewed, patient examined, no change in status, stable for surgery.  I have reviewed the patient's chart and labs.  Questions were answered to the patient's satisfaction.     Lillette Boxer Daiton Cowles

## 2019-11-01 ENCOUNTER — Telehealth: Payer: Self-pay | Admitting: Cardiology

## 2019-11-01 ENCOUNTER — Encounter (HOSPITAL_COMMUNITY): Payer: Self-pay | Admitting: Urology

## 2019-11-01 DIAGNOSIS — N3041 Irradiation cystitis with hematuria: Secondary | ICD-10-CM | POA: Diagnosis not present

## 2019-11-01 LAB — URINE CULTURE: Culture: >=100000 — AB

## 2019-11-01 MED ORDER — CEPHALEXIN 500 MG PO CAPS: 500.0000 mg | ORAL_CAPSULE | Freq: Two times a day (BID) | ORAL | 0 refills | Status: AC

## 2019-11-01 NOTE — Progress Notes (Signed)
Pt VS WNL.  PIV discontinued by patient.  Catheter intact and clotting within expected timeframe.  Discharge instructions provided to and reviewed with pt.  Hard copy of Rx provided as well.  Pt transported via wheelchair to front lobby by [facility] staff.

## 2019-11-01 NOTE — Op Note (Signed)
Preop diagnosis: History of bladder cancer, history of radiation cystitis, persistent hematuria, rule out bladder tumor recurrence  Postoperative diagnosis: Lesion at bladder neck on the right, right trigonal lesion, bladder neck mass  Principal procedure: Cystoscopy, attempted bilateral retrograde ureteral pyelograms, transurethral resection of lesion at right bladder neck (1.5 cm), right trigone (1 cm), bladder neck mass (3 cm), cauterization of telangiectatic vessels at bladder neck, intraoperative fluoroscopy  Surgeon: Arryanna Holquin  Anesthesia: General with LMA  Complications: None  Estimated blood loss: 100 cc  Specimen: Bladder biopsies/biopsy of bladder neck mass resection  Drains: 24 French Foley catheter  Indications: 69 year old male status post external beam radiotherapy for intermediate risk prostate cancer.  Also, he had a newly diagnosed urothelial carcinoma treated earlier this year which revealed high-grade nonmuscle invasive bladder cancer.  We attempted BCG treatment following this, which he did not tolerate well.  He has had persistent gross hematuria, as well as recurrent urinary tract infections which precluded BCG treatment.  He presents at this time for repeat cystoscopy, bilateral retrograde studies of his upper tracts, biopsy of any abnormal urothelial lesions, cauterization of any bleeders.  I discussed the procedure with the patient as well as his sister [name] who has power of attorney.  They understand the procedure, risks involved which include but are not limited to infection, bleeding, need for catheterization, need for possible hospitalization afterwards, anesthetic complications.  They desire to proceed.  Findings: Urethra was normal.  Prostatic urethra was somewhat obstructive with friable urothelium.  There were no specific lesions within this.  Initial inspection of the bladder was somewhat difficult due to bleeding.  Once we changed irrigation from saline to  water, visualization was somewhat better.  I was unable to locate the ureteral orifice ease.  Attempted catheterization of the left orifice just revealed filling of a bladder diverticulum.  There was a raised lesion at the right bladder neck measuring approximately 1.5 cm.  A right trigonal lesion was somewhat smaller.  In the midline bladder neck/trigonal region, there was a mass which was pedunculated which was easily dislodged from the bladder surface.  This was somewhat blanched in color.  Cystoscopically it may have been a necrotic tumor.  No other urothelial lesions were seen.  At the bladder neck, most likely secondary to the patient's radiation, there were multiple telangiectatic vessels.  Description of procedure: The patient was properly identified in the holding area.  I spoke with his sister beforehand as well.  He was taken to the operating room where general anesthetic was administered with the LMA.  He was placed in the dorsolithotomy position.  IV antibiotics were administered.  Proper timeout was performed.  21 French panendoscope was advanced into his bladder with the above-mentioned findings once the bleeding cleared and water was used as irrigant.  Attempted bilateral retrograde pyelograms performed, these were unsuccessful.  Following this, the cystoscope was removed and using the visual obturator a 26 French resectoscope sheath with placed.  Resectoscope and cutting loop were then placed.  The pedunculated mass in the bladder neck region was dislodged.  It could not be irrigated from the bladder, so this needed to separate resection which was somewhat difficult due to its free-floating nature in the posterior bladder.  Once this was resected, fragments were irrigated from the bladder.  The 2 other lesions in the right bladder neck/trigonal area were then resected down into the muscular layer.  Small bleeders at the bladder tumor bases was then cauterized.  The telangiectatic vessels in  the  posterior bladder neck/bladder wall were also cauterized.  At this point, there was no active bleeding.  Following removal of all biopsied specimens, the scope was removed and a 20 [place] Foley catheter was then placed.  The patient was then awakened and taken to the PACU in stable condition.  He tolerated procedure well.

## 2019-11-01 NOTE — Discharge Summary (Signed)
Patient ID: Ian Hill MRN: [id number] DOB/AGE: 1950/06/11 69 y.o.  Admit date: 10/31/2019 Discharge date: 11/01/2019  Primary Care Physician:  Linda Hedges, NP  Discharge Diagnoses:  Bladder cancer Present on Admission: . Malignant neoplasm of overlapping sites of bladder [facility])   Consults:  None   Discharge Medications: Allergies as of 11/01/2019      Reactions   Feraheme [ferumoxytol] Anaphylaxis      Medication List    STOP taking these medications   apixaban 5 MG Tabs tablet Commonly known as: ELIQUIS     TAKE these medications   CALCIUM CARBONATE-SIMETHICONE PO Take 1 tablet by mouth daily as needed (gas).   cephALEXin 500 MG capsule Commonly known as: Keflex Take 1 capsule (500 mg total) by mouth 2 (two) times daily.   digoxin 0.125 MG tablet Commonly known as: LANOXIN Take 0.125 mg by mouth daily.   feeding supplement (ENSURE ENLIVE) Liqd Take 237 mLs by mouth 3 (three) times daily.   feeding supplement (ENSURE ENLIVE) Liqd Take 237 mLs by mouth 2 (two) times daily between meals.   finasteride 5 MG tablet Commonly known as: PROSCAR Take 1 tablet (5 mg total) by mouth daily.   Myrbetriq 25 MG Tb24 tablet Generic drug: mirabegron ER Take 25 mg by mouth daily.   nicotine 14 mg/24hr patch Commonly known as: NICODERM CQ - dosed in mg/24 hours Place 14 mg onto the skin daily.   nicotine polacrilex 2 MG gum Commonly known as: NICORETTE Take 2 mg by mouth as needed for smoking cessation.   phenazopyridine 200 MG tablet Commonly known as: PYRIDIUM Take 200 mg by mouth 2 (two) times daily as needed for pain.   polyethylene glycol 17 g packet Commonly known as: MIRALAX / GLYCOLAX Take 17 g by mouth daily as needed. What changed: when to take this   rosuvastatin 10 MG tablet Commonly known as: CRESTOR Take 10 mg by mouth daily.   sennosides-docusate sodium 8.6-50 MG tablet Commonly known as: SENOKOT-S Take 2 tablets by mouth at bedtime  as needed for constipation.   sodium chloride 0.65 % Soln nasal spray Commonly known as: OCEAN Place 1 spray into both nostrils 4 (four) times daily as needed (nasal dryness).   tamsulosin 0.4 MG Caps capsule Commonly known as: FLOMAX Take 1 capsule (0.4 mg total) by mouth daily.   Vitamin D2 50 MCG (2000 UT) Tabs Take 2,000 Units by mouth daily.   Vitron-C 65-125 MG Tabs Generic drug: Iron-Vitamin C Take 1 tablet by mouth daily.        Significant Diagnostic Studies:  No results found.  Brief H and P: For complete details please refer to admission H and P, but in brief the patient was admitted for operative management of recurrent bladder cancer as well as gross hematuria.  Hospital Course: Patient was taken to the operating room where he underwent cystoscopy, attempted bilateral retrograde ureteral pyelograms, resection of bladder tumor and cauterization of bleeders.  He did quite well postoperatively.  He did have some bladder spasms prior to removal of the catheter.  Following catheter removal postoperative morning #1, he voided quite well and the urine was clear.  He was discharged at this point.   Day of Discharge BP (!) 142/73 (BP Location: Left Arm)   Pulse 66   Temp 97.6 F (36.4 C) (Oral)   Resp 20   Ht 5\' 6"  (1.676 m)   Wt 53.1 kg   SpO2 100%   BMI 18.88 kg/m  No results found for this or any previous visit (from the past 24 hour(s)).  Physical Exam: General: Alert and awake oriented x3 not in any acute distress. HEENT: anicteric sclera, pupils reactive to light and accommodation CVS: S1-S2 clear no murmur rubs or gallops Chest: clear to auscultation bilaterally, no wheezing rales or rhonchi Abdomen: soft nontender, nondistended, normal bowel sounds, no organomegaly Extremities: no cyanosis, clubbing or edema noted bilaterally Neuro: Cranial nerves II-XII intact, no focal neurological deficits  Disposition: Home  Diet: No restrictions  Activity: No  restrictions   Disposition and Follow-up:   Follow-up will be arranged pending pathology results  TESTS THAT NEED FOLLOW-UP  Pathology review  DISCHARGE FOLLOW-UP   Follow-up Information    Franchot Gallo, MD Follow up.   Specialty: Urology Why: Dr D will call with biopsy results as well as followup Contact information: [place] [address] [phone]               Time spent on Discharge:  15 minutes  Signed: Lillette Boxer Eugina Row 11/01/2019, 12:12 PM

## 2019-11-01 NOTE — Plan of Care (Signed)

## 2019-11-01 NOTE — Telephone Encounter (Signed)
Informed Ian Hill that this should be started ASAP after procedure. She states that she will call surgeon's office

## 2019-11-01 NOTE — Telephone Encounter (Signed)
Pt c/o medication issue:  1. Name of Medication: apixaban (ELIQUIS) 5 MG TABS  2. How are you currently taking this medication (dosage and times per day)? Pt stopped taking for his procedure he had 06/21  3. Are you having a reaction (difficulty breathing--STAT)? no  4. What is your medication issue? Regina from [facility] wanted to know when the patient should start the medication again

## 2019-11-01 NOTE — Discharge Instructions (Signed)
1. You may see some blood in the urine and may have some burning with urination for 48-72 hours. You also may notice that you have to urinate more frequently or urgently after your procedure which is normal.  2. You should call should you develop an inability urinate, fever > 101, persistent nausea and vomiting that prevents you from eating or drinking to stay hydrated.  3. OK to restart Eliquis once urine turns yellow

## 2019-11-02 LAB — SURGICAL PATHOLOGY

## 2020-03-05 NOTE — Progress Notes (Signed)
HPI: FU hypertrophic cardiomyopathy. Abdominal ultrasound in August of 2011 showed no aneurysm. Carotid Dopplers showed 0-39% bilateral stenosis. Holter monitor showed nonsustained ventricular tachycardia (7 beats). Stress echocardiogram showed possible inferior hypokinesis but systolic blood pressure decreased with exercise. We discussed referral for ICD but the family ultimately declined understanding risk of sudden cardiac death. Nuclear study December 06, 2016 showed normal perfusion. Patient was admitted September 2018 and had a CVA. Patient was noted to have occluded right vertebral artery and underwent percutaneous intervention. During that admission he was noted to have atrial fibrillation/flutter. He was placed on apixaban. Last echocardiogram October 2020 showed normal LV function, severe left ventricular hypertrophy more prominent in the septum and apex, biatrial enlargement, mild mitral regurgitation,, severe tricuspid regurgitation and moderate pulmonary hypertension.  Patient also being treated for prostate cancer.  Patient had intermittent complete heart block during previous hospitalization.  Pacemaker was discussed but watchful waiting was the ultimate decision.  Beta-blocker discontinued.  Since last seen,  patient denies dyspnea, chest pain, palpitations, syncope or bleeding.  Current Outpatient Medications  Medication Sig Dispense Refill   CALCIUM CARBONATE-SIMETHICONE PO Take 1 tablet by mouth daily as needed (gas).     cephALEXin (KEFLEX) 500 MG capsule Take 1 capsule (500 mg total) by mouth 2 (two) times daily. 10 capsule 0   digoxin (LANOXIN) 0.125 MG tablet Take 0.125 mg by mouth daily.     Ergocalciferol (VITAMIN D2) 50 MCG (2000 UT) TABS Take 2,000 Units by mouth daily.     feeding supplement, ENSURE ENLIVE, (ENSURE ENLIVE) LIQD Take 237 mLs by mouth 3 (three) times daily. 237 mL 12   feeding supplement, ENSURE ENLIVE, (ENSURE ENLIVE) LIQD Take 237 mLs by mouth 2 (two)  times daily between meals. 237 mL 12   finasteride (PROSCAR) 5 MG tablet Take 1 tablet (5 mg total) by mouth daily. 30 tablet 0   Iron-Vitamin C (VITRON-C) 65-125 MG TABS Take 1 tablet by mouth daily.     mirabegron ER (MYRBETRIQ) 25 MG TB24 tablet Take 25 mg by mouth daily.     nicotine (NICODERM CQ - DOSED IN MG/24 HOURS) 14 mg/24hr patch Place 14 mg onto the skin daily.     nicotine polacrilex (NICORETTE) 2 MG gum Take 2 mg by mouth as needed for smoking cessation.     phenazopyridine (PYRIDIUM) 200 MG tablet Take 200 mg by mouth 2 (two) times daily as needed for pain.     polyethylene glycol (MIRALAX / GLYCOLAX) packet Take 17 g by mouth daily as needed. (Patient taking differently: Take 17 g by mouth daily. ) 14 each 0   rosuvastatin (CRESTOR) 10 MG tablet Take 10 mg by mouth daily.      sennosides-docusate sodium (SENOKOT-S) 8.6-50 MG tablet Take 2 tablets by mouth at bedtime as needed for constipation.     sodium chloride (OCEAN) 0.65 % SOLN nasal spray Place 1 spray into both nostrils 4 (four) times daily as needed (nasal dryness).     tamsulosin (FLOMAX) 0.4 MG CAPS capsule Take 1 capsule (0.4 mg total) by mouth daily. 30 capsule 0   No current facility-administered medications for this visit.     Past Medical History:  Diagnosis Date   Anticoagulant long-term use    eliquis   Bladder tumor    BPH (benign prostatic hyperplasia)    CKD (chronic kidney disease), stage II    Diverticulosis    Dyslipidemia    History of CVA with residual deficit 01/28/2017  acute right MCA infarc with M2 embolic occlusion w/p  tPA and mechnical thrombectomy---  residual left side weakness   History of external beam radiation therapy    03-23-2018  to 05-03-2018  prostate cancer   Hypertension    Hypertensive heart disease    Hypertrophic cardiomegaly, apical variant    Hypertrophic obstructive cardiomyopathy with diastolic heart failure [facility])    cardiologist-- dr  Stanford Breed   Mitral regurgitation    Other problems related to education and literacy    can not read   Prostate cancer [facility]) urologist-  dr dahlstedt/  oncologsit-- dr Tammi Klippel   dx 07/ 2018---  Stage T1a,  Gleason 4+3--- completed external radiation therapy 05-03-2018   Stroke [facility]) 2019    Past Surgical History:  Procedure Laterality Date   aspergilloma resection     LUL, s/p resection Feb 1991 SISTER NOT SURE OF THIS   CARDIOVASCULAR STRESS TEST  11-19-2016  dr Stanford Breed   Low risk nuclear study/  normal resting and stress perfusion with no ischemia or infarction/  EF not done due to PVCIs , TID borderline, 1.2 significant LVH   CYSTOSCOPY W/ RETROGRADES Bilateral 10/31/2019   Procedure: CYSTOSCOPY WITH ATTEMPTED RETROGRADE PYELOGRAM;  Surgeon: Franchot Gallo, MD;  Location: [facility];  Service: Urology;  Laterality: Bilateral;  1 HR   CYSTOSCOPY WITH BIOPSY N/A 06/16/2019   Procedure: TRANSURETHERAL RESECTION OF BLADDER TUMOR.;  Surgeon: Franchot Gallo, MD;  Location: [facility];  Service: Urology;  Laterality: N/A;  [place] N/A 10/31/2019   Procedure: TURBT;  Surgeon: Franchot Gallo, MD;  Location: [facility];  Service: Urology;  Laterality: N/A;   GOLD SEED IMPLANT N/A 03/10/2018   Procedure: GOLD SEED IMPLANT;  Surgeon: Franchot Gallo, MD;  Location: [facility];  Service: Urology;  Laterality: N/A;   IR PERCUTANEOUS ART THROMBECTOMY/INFUSION INTRACRANIAL INC DIAG ANGIO  01/23/2017   RADIOLOGY WITH ANESTHESIA N/A 01/23/2017   Procedure: RADIOLOGY WITH ANESTHESIA;  Surgeon: Luanne Bras, MD;  Location: [place];  Service: Radiology;  Laterality: N/A;   SPACE OAR INSTILLATION N/A 03/10/2018   Procedure: SPACE OAR INSTILLATION;  Surgeon: Franchot Gallo, MD;  Location: [facility];  Service: Urology;  Laterality: N/A;   TRANSTHORACIC ECHOCARDIOGRAM  11-20-2016   dr Stanford Breed   mild LVH,  apical hypertrophic  cardiomyopathy, systolic function vigorous,  ef 65-70%,  grade 2 diastolic dysfunction/  mild AR, MR, and  PR/  moderate LAE and RAE/  mild to mod. TR/  moderate increase PASP 73mmHg   TRANSURETHRAL RESECTION OF BLADDER TUMOR WITH MITOMYCIN-C N/A 12/09/2018   Procedure: TRANSURETHRAL RESECTION OF BLADDER TUMOR WITH GEMCITABINE GIVEN IN PACU;  Surgeon: Franchot Gallo, MD;  Location: [facility];  Service: Urology;  Laterality: N/A;   TRANSURETHRAL RESECTION OF PROSTATE N/A 11/24/2016   Procedure: TRANSURETHRAL RESECTION OF THE PROSTATE (TURP);  Surgeon: Franchot Gallo, MD;  Location: [facility];  Service: Urology;  Laterality: N/A;    Social History   Socioeconomic History   Marital status: Single    Spouse name: Not on file   Number of children: Not on file   Years of education: Not on file   Highest education level: Not on file  Occupational History    Comment: disabled  Tobacco Use   Smoking status: Current Some Day Smoker    Packs/day: 0.50    Years: 51.00    Pack years: 25.50    Types: Cigarettes  Smokeless tobacco: Never Used  Vaping Use   Vaping Use: Never used  Substance and Sexual Activity   Alcohol use: No   Drug use: No   Sexual activity: Not Currently  Other Topics Concern   Not on file  Social History Narrative   ** Merged History Encounter **      Resides with sister, Osvaldo Angst       Social Determinants of Health   Financial Resource Strain:    Difficulty of Paying Living Expenses: Not on file  Food Insecurity:    Worried About Charity fundraiser in the Last Year: Not on file   [employer] of Food in the Last Year: Not on file  Transportation Needs:    Lack of Transportation (Medical): Not on file   Lack of Transportation (Non-Medical): Not on file  Physical Activity: Unknown   Days of Exercise per Week: Patient refused   Minutes of Exercise per Session: Patient refused  Stress:    Feeling of  Stress : Not on file  Social Connections: Unknown   Frequency of Communication with Friends and Family: More than three times a week   Frequency of Social Gatherings with Friends and Family: More than three times a week   Attends Religious Services: Patient refused   Marine scientist or Organizations: No   Attends Music therapist: Never   Marital Status: Never married  Human resources officer Violence: Not At Risk   Fear of Current or Ex-Partner: No   Emotionally Abused: No   Physically Abused: No   Sexually Abused: No    Family History  Problem Relation Age of Onset   Cancer Paternal Aunt        unknown    ROS: Occasional abdominal pain but no fevers or chills, productive cough, hemoptysis, dysphasia, odynophagia, melena, hematochezia, dysuria, hematuria, rash, seizure activity, orthopnea, PND, pedal edema, claudication. Remaining systems are negative.  Physical Exam: Well-developed thin in no acute distress.  Skin is warm and dry.  HEENT is normal.  Neck is supple.  Chest is clear to auscultation with normal expansion.  Cardiovascular exam is regular rate and rhythm.  Abdominal exam nontender or distended. No masses palpated. Extremities show no edema. neuro grossly intact   A/P  1 hypertrophic apical variant cardiomyopathy-as outlined above beta-blocker was discontinued previously due to bradycardia.  They have previously declined ICD.  Repeat echocardiogram.  2 paroxysmal atrial fibrillation-we will continue digoxin (this was initiated by electrophysiology previously; atenolol was discontinued due to bradycardia) and apixaban.  3 hypertension-patient's blood pressure is controlled.  Continue present medications and follow.  4 hyperlipidemia-continue statin.  5 tobacco abuse-patient again counseled on discontinuing.  Kirk Ruths, MD

## 2020-03-09 ENCOUNTER — Ambulatory Visit (INDEPENDENT_AMBULATORY_CARE_PROVIDER_SITE_OTHER): Payer: Medicare (Managed Care) | Admitting: Cardiology

## 2020-03-09 ENCOUNTER — Encounter: Payer: Self-pay | Admitting: Cardiology

## 2020-03-09 ENCOUNTER — Other Ambulatory Visit: Payer: Self-pay

## 2020-03-09 VITALS — BP 140/71 | HR 67 | Temp 94.5°F | Wt 115.0 lb

## 2020-03-09 DIAGNOSIS — I480 Paroxysmal atrial fibrillation: Secondary | ICD-10-CM

## 2020-03-09 DIAGNOSIS — I10 Essential (primary) hypertension: Secondary | ICD-10-CM

## 2020-03-09 DIAGNOSIS — I517 Cardiomegaly: Secondary | ICD-10-CM | POA: Diagnosis not present

## 2020-03-09 NOTE — Patient Instructions (Signed)
  Testing/Procedures: Your physician has requested that you have an echocardiogram. Echocardiography is a painless test that uses sound waves to create images of your heart. It provides your doctor with information about the size and shape of your heart and how well your heart's chambers and valves are working. This procedure takes approximately one hour. There are no restrictions for this procedure. [place]. [place].     Follow-Up: At [facility], you and your health needs are our priority.  As part of our continuing mission to provide you with exceptional heart care, we have created designated Provider Care Teams.  These Care Teams include your primary Cardiologist (physician) and Advanced Practice Providers (APPs -  Physician Assistants and Nurse Practitioners) who all work together to provide you with the care you need, when you need it.  We recommend signing up for the patient portal called "MyChart".  Sign up information is provided on this After Visit Summary.  MyChart is used to connect with patients for Virtual Visits (Telemedicine).  Patients are able to view lab/test results, encounter notes, upcoming appointments, etc.  Non-urgent messages can be sent to your provider as well.   To learn more about what you can do with MyChart, go to [email or web address].    Your next appointment:   6 month(s)  The format for your next appointment:   In Person  Provider:   Kirk Ruths, MD

## 2020-03-19 ENCOUNTER — Telehealth: Payer: Self-pay | Admitting: *Deleted

## 2020-03-19 NOTE — Telephone Encounter (Signed)
Patient recently saw dr Stanford Breed and we need to confirm his medication list.

## 2020-03-23 ENCOUNTER — Encounter: Payer: Self-pay | Admitting: *Deleted

## 2020-03-23 NOTE — Telephone Encounter (Signed)
Letter mailed to patient sister, with medication list enclosed, asking her to review for errors.

## 2020-04-03 ENCOUNTER — Ambulatory Visit (HOSPITAL_COMMUNITY): Payer: Medicare (Managed Care) | Attending: Internal Medicine

## 2020-04-03 ENCOUNTER — Other Ambulatory Visit: Payer: Self-pay

## 2020-04-03 DIAGNOSIS — I517 Cardiomegaly: Secondary | ICD-10-CM | POA: Insufficient documentation

## 2020-04-03 LAB — ECHOCARDIOGRAM COMPLETE
Area-P 1/2: 4.96 cm2
P 1/2 time: 620 msec
S' Lateral: 2.9 cm

## 2020-07-02 ENCOUNTER — Telehealth: Payer: Self-pay

## 2020-07-02 NOTE — Telephone Encounter (Signed)
I connected by phone with Ian Hill and/or patient's caregiver on 07/02/2020 at 1:28 PM to discuss the potential vaccination through our Homebound vaccination initiative.   Prevaccination Checklist for COVID-19 Vaccines  1.  Are you feeling sick today? no  2.  Have you ever received a dose of a COVID-19 vaccine?  yes      If yes, which one? Moderna   How many dose of Covid-19 vaccine have your received and dates ? 2, 08/09/2019, 09/07/2019 (per sister/caregiver, 2nd dose not listed in CVMS)   Check all that apply: I live in a long-term care setting. no  I have been diagnosed with a medical condition(s). Please list: Hx of CVA, prostate cancer, malignant neoplasm-bladder. (pertinent to homebound status)  I am a first responder. no  I work in a long-term care facility, correctional facility, hospital, restaurant, retail setting, school, or other setting with high exposure to the public. no  4. Do you have a health condition or are you undergoing treatment that makes you moderately or severely immunocompromised? (This would include treatment for cancer or HIV, receipt of organ transplant, immunosuppressive therapy or high-dose corticosteroids, CAR-T-cell therapy, hematopoietic cell transplant [HCT], DiGeorge syndrome or Wiskott-Aldrich syndrome)  no  5. Have you received hematopoietic cell transplant (HCT) or CAR-T-cell therapies since receiving COVID-19 vaccine? no  6.  Have you ever had an allergic reaction: (This would include a severe reaction [ e.g., anaphylaxis] that required treatment with epinephrine or EpiPen or that caused you to go to the hospital.  It would also include an allergic reaction that occurred within 4 hours that caused hives, swelling, or respiratory distress, including wheezing.) A.  A previous dose of COVID-19 vaccine. no  B.  A vaccine or injectable therapy that contains multiple components, one of which is a COVID-19 vaccine component, but it is not known which  component elicited the immediate reaction. no  C.  Are you allergic to polyethylene glycol? no  D. Are you allergic to Polysorbate, which is found in some vaccines, film coated tablets and intravenous steroids?  no   7.  Have you ever had an allergic reaction to another vaccine (other than COVID-19 vaccine) or an injectable medication? (This would include a severe reaction [ e.g., anaphylaxis] that required treatment with epinephrine or EpiPen or that caused you to go to the hospital.  It would also include an allergic reaction that occurred within 4 hours that caused hives, swelling, or respiratory distress, including wheezing.)  no   8.  Have you ever had a severe allergic reaction (e.g., anaphylaxis) to something other than a component of the COVID-19 vaccine, or any vaccine or injectable medication?  This would include food, pet, venom, environmental, or oral medication allergies.  no   Check all that apply to you:  Am a male between ages 34 and 60 years old  no  Women 75 through 72 years of age can receive any FDA-authorized or -approved COVID-19 vaccine. However, they should be informed of the rare but increased risk of thrombosis with thrombocytopenia syndrome (TTS) after receipt of the [employer] Vaccine and the availability of other FDA-authorized and -approved COVID-19 vaccines. People who had TTS after a first dose of Janssen vaccine should not receive a subsequent dose of Janssen product    Am a male between ages 79 and 108 years old  no Males 5 through 72 years of age may receive the correct formulation of Pfizer-BioNTech COVID-19 vaccine. Males 18 and older can receive  any FDA-authorized or -approved vaccine. However, people receiving an mRNA COVID-19 vaccine, especially males 60 through 72 years of age and their parents/legal representative (when relevant), should be informed of the risk of developing myocarditis (an inflammation of the heart muscle) or pericarditis (inflammation  of the lining around the heart) after receipt of an mRNA vaccine. The risk of developing either myocarditis or pericarditis after vaccination is low, and lower than the risk of myocarditis associated with SARS-CoV-2 infection in adolescents and adults. Vaccine recipients should be counseled about the need to seek care if symptoms of myocarditis or pericarditis develop after vaccination     Have a history of myocarditis or pericarditis  no Myocarditis or pericarditis after receipt of the first dose of an mRNA COVID-19 vaccine series but before administration of the second dose  Experts advise that people who develop myocarditis or pericarditis after a dose of an mRNA COVID-19 vaccine not receive a subsequent dose of any COVID-19 vaccine, until additional safety data are available.  Administration of a subsequent dose of COVID-19 vaccine before safety data are available can be considered in certain circumstances after the episode of myocarditis or pericarditis has completely resolved. Until additional data are available, some experts recommend a Alphonsa Overall COVID-19 vaccine be considered instead of an mRNA COVID-19 vaccine. Decisions about proceeding with a subsequent dose should include a conversation between the patient, their parent/legal representative (when relevant), and their clinical team, which may include a cardiologist.    Have been treated with monoclonal antibodies or convalescent serum to prevent or treat COVID-19  no Vaccination should be offered to people regardless of history of prior symptomatic or asymptomatic SARS-CoV-2 infection. There is no recommended minimal interval between infection and vaccination.  However, vaccination should be deferred if a patient received monoclonal antibodies or convalescent serum as treatment for COVID-19 or for post-exposure prophylaxis. This is a precautionary measure until additional information becomes available, to avoid interference of the antibody  treatment with vaccine-induced immune responses.  Defer COVID-19 vaccination for 30 days when a passive antibody product was used for post-exposure prophylaxis.  Defer COVID-19 vaccination for 90 days when a passive antibody product was used to treat COVID-19.     Diagnosed with Multisystem Inflammatory Syndrome (MIS-C or MIS-A) after a COVID-19 infection  no It is unknown if people with a history of MIS-C or MIS-A are at risk for a dysregulated immune response to COVID-19 vaccination.  People with a history of MIS-C or MIS-A may choose to be vaccinated. Considerations for vaccination may include:   Clinical recovery from MIS-C or MIS-A, including return to normal cardiac function   Personal risk of severe acute COVID-19 (e.g., age, underlying conditions)   High or substantial community transmission of SARS-CoV-2 and personal increased risk of reinfection.   Timing of any immunomodulatory therapies (general best practice guidelines for immunization can be consulted for more information [email or web address])   It has been 90 days or more since their diagnosis of MIS-C   Onset of MIS-C occurred before any COVID-19 vaccination   A conversation between the patient, their guardian(s), and their clinical team or a specialist may assist with COVID-19 vaccination decisions. Healthcare providers and health departments may also request a consultation from the [place] at [email or web address].     Have a bleeding disorder  no Take a blood thinner  no As with all vaccines, any COVID-19 vaccine product may be given to these patients, if a physician familiar with the patient's bleeding risk  determines that the vaccine can be administered intramuscularly with reasonable safety.  ACIP recommends the following technique for intramuscular vaccination in patients with bleeding  disorders or taking blood thinners: a fine-gauge needle (23-gauge or smaller caliber) should be used for the vaccination, followed by firm pressure on the site, without rubbing, for at least 2 minutes.  People who regularly take aspirin or anticoagulants as part of their routine medications do not need to stop these medications prior to receipt of any COVID-19 vaccine.    Have a history of heparin-induced thrombocytopenia (HIT)  no Although the etiology of TTS associated with the Alphonsa Overall COVID-19 vaccine is unclear, it appears to be similar to another rare immune-mediated syndrome, heparin-induced thrombocytopenia (HIT). People with a history of an episode of an immune-mediated syndrome characterized by thrombosis and thrombocytopenia, such as HIT, should be offered a currently FDA-approved or FDA-authorized mRNA COVID-19 vaccine if it has been ?90 days since their TTS resolved. After 90 days, patients may be vaccinated with any currently FDA-approved or FDA-authorized COVID-19 vaccine, including Janssen COVID-19 Vaccine. However, people who developed TTS after their initial Alphonsa Overall vaccine should not receive a Janssen booster dose.  Experts believe the following factors do not make people more susceptible to TTS after receipt of the [employer]. People with these conditions can be vaccinated with any FDA-authorized or - approved COVID-19 vaccine, including the [employer] COVID-19 Vaccine:   A prior history of venous thromboembolism   Risk factors for venous thromboembolism (e.g., inherited or acquired thrombophilia including Factor V Leiden; prothrombin gene 20210A mutation; antiphospholipid syndrome; protein C, protein S or antithrombin deficiency   A prior history of other types of thromboses not associated with thrombocytopenia   Pregnancy, post-partum status, or receipt of hormonal contraceptives (e.g., combined oral contraceptives, patch, ring)   Additional recipient education materials  can be found at http://gutierrez-robinson.com/ vaccines/safety/JJUpdate.html.    Am currently pregnant or breastfeeding  no Vaccination is recommended for all people aged 65 years and older, including people that are:   Pregnant   Breastfeeding   Trying to get pregnant now or who might become pregnant in the future   Pregnant, breastfeeding, and post-partum people 51 through 72 years of age should be aware of the rare risk of TTS after receipt of the Alphonsa Overall COVID-19 Vaccine and the availability of other FDA-authorized or -approved COVID-19 vaccines (i.e., mRNA vaccines).    Have received dermal fillers  no FDA-authorized or -approved COVID-19 vaccines can be administered to people who have received injectable dermal fillers who have no contraindications for vaccination.  Infrequently, these people might experience temporary swelling at or near the site of filler injection (usually the face or lips) following administration of a dose of an mRNA COVID-19 vaccine. These people should be advised to contact their healthcare provider if swelling develops at or near the site of dermal filler following vaccination.     Have a history of Guillain-Barr Syndrome (GBS)  no People with a history of GBS can receive any FDA-authorized or -approved COVID-19 vaccine. However, given the possible association between the [employer] and an increased risk of GBS, a patient with a history of GBS and their clinical team should discuss the availability of mRNA vaccines to offer protection against COVID-19. The highest risk has been observed in men aged 34-64 years with symptoms of GBS beginning within 42 days after Alphonsa Overall COVID-19 vaccination.  People who had GBS after receiving Janssen vaccine should be made aware of the option to receive  an mRNA COVID-19 vaccine booster at least 2 months (8 weeks) after the Janssen dose. However, Alphonsa Overall vaccine may be used as a booster, particularly if GBS occurred  more than 42 days after vaccination or was related to a non-vaccine factor. Prior to booster vaccination, a conversation between the patient and their clinical team may assist with decisions about use of a COVID-19 booster dose, including the timing of administration     Postvaccination Observation Times for People without Contraindications to Covid 19 Vaccination.  30 minutes:  People with a history of: A contraindication to another type of COVID-19 vaccine product (i.e., mRNA or viral vector COVID-19 vaccines)   Immediate (within 4 hours of exposure) non-severe allergic reaction to a COVID-19 vaccine or injectable therapies   Anaphylaxis due to any cause   Immediate allergic reaction of any severity to a non-COVID-19 vaccine   15 minutes: All other people  This patient is a 70 y.o. male that meets the FDA criteria to receive homebound vaccination. Patient or parent/caregiver understands they have the option to accept or refuse homebound vaccination.  Patient passed the pre-screening checklist and would like to proceed with homebound vaccination.  Based on questionnaire above, I recommend the patient be observed for 15 minutes.  There are an estimated #0 other household members/caregivers who are also interested in receiving the vaccine.    The patient has been confirmed homebound and eligible for homebound vaccination with the considerations outlined above. I will send the patient's information to our scheduling team who will reach out to schedule the patient and potential caregiver/family members for homebound vaccination.    Dan Humphreys 07/02/2020 1:28 PM

## 2020-07-19 ENCOUNTER — Ambulatory Visit: Payer: Medicare (Managed Care) | Attending: Critical Care Medicine

## 2020-07-19 DIAGNOSIS — Z23 Encounter for immunization: Secondary | ICD-10-CM

## 2020-07-19 NOTE — Progress Notes (Signed)
   Covid-19 Vaccination Clinic  Name:  RAYSHUN KANDLER    MRN: [id number] DOB: 12/12/1950  07/19/2020  Mr. Krinke was observed post Covid-19 immunization for 15 minutes without incident. He was provided with Vaccine Information Sheet and instruction to access the V-Safe system.   Mr. Alewine was instructed to call 911 with any severe reactions post vaccine: Marland Kitchen Difficulty breathing  . Swelling of face and throat  . A fast heartbeat  . A bad rash all over body  . Dizziness and weakness   Immunizations Administered    Name Date Dose VIS Date Route   PFIZER Comrnaty(Gray TOP) Covid-19 Vaccine 07/19/2020 11:45 AM 0.3 mL 04/19/2020 Intramuscular   Manufacturer: [employer]   Lot: [id number]   NDC: [phone]

## 2021-07-29 NOTE — Progress Notes (Deleted)
? ?Cardiology Clinic Note  ? ?Patient Name: Ian Hill ?Date of Encounter: 07/29/2021 ? ?Primary Care Provider:  Linda Hedges, NP (Inactive) ?Primary Cardiologist:  Kirk Ruths, MD ? ?Patient Profile  ?  ?*** ? ?Past Medical History  ?  ?Past Medical History:  ?Diagnosis Date  ? Anticoagulant long-term use   ? eliquis  ? Bladder tumor   ? BPH (benign prostatic hyperplasia)   ? CKD (chronic kidney disease), stage II   ? Diverticulosis   ? Dyslipidemia   ? History of CVA with residual deficit 01/28/2017  ? acute right MCA infarc with M2 embolic occlusion w/p  tPA and mechnical thrombectomy---  residual left side weakness  ? History of external beam radiation therapy   ? 03-23-2018  to 05-03-2018  prostate cancer  ? Hypertension   ? Hypertensive heart disease   ? Hypertrophic cardiomegaly, apical variant   ? Hypertrophic obstructive cardiomyopathy with diastolic heart failure ([place])   ? cardiologist-- dr Stanford Breed  ? Mitral regurgitation   ? Other problems related to education and literacy   ? can not read  ? Prostate cancer [facility]) urologist-  dr dahlstedt/  oncologsit-- dr Tammi Klippel  ? dx 07/ 2018---  Stage T1a,  Gleason 4+3--- completed external radiation therapy 05-03-2018  ? Stroke [facility]) 2019  ? ?Past Surgical History:  ?Procedure Laterality Date  ? aspergilloma resection    ? LUL, s/p resection Feb 1991 SISTER NOT SURE OF THIS  ? CARDIOVASCULAR STRESS TEST  11-19-2016  dr Stanford Breed  ? Low risk nuclear study/  normal resting and stress perfusion with no ischemia or infarction/  EF not done due to PVCIs , TID borderline, 1.2 significant LVH  ? CYSTOSCOPY W/ RETROGRADES Bilateral 10/31/2019  ? Procedure: CYSTOSCOPY WITH ATTEMPTED RETROGRADE PYELOGRAM;  Surgeon: Franchot Gallo, MD;  Location: [facility];  Service: Urology;  Laterality: Bilateral;  1 HR  ? CYSTOSCOPY WITH BIOPSY N/A 06/16/2019  ? Procedure: TRANSURETHERAL RESECTION OF BLADDER TUMOR.;  Surgeon: Franchot Gallo, MD;  Location: [facility];  Service:  Urology;  Laterality: N/A;  30 MINS  ? CYSTOSCOPY WITH BIOPSY N/A 10/31/2019  ? Procedure: TURBT;  Surgeon: Franchot Gallo, MD;  Location: [facility];  Service: Urology;  Laterality: N/A;  ? GOLD SEED IMPLANT N/A 03/10/2018  ? Procedure: GOLD SEED IMPLANT;  Surgeon: Franchot Gallo, MD;  Location: [facility];  Service: Urology;  Laterality: N/A;  ? IR PERCUTANEOUS ART THROMBECTOMY/INFUSION INTRACRANIAL INC DIAG ANGIO  01/23/2017  ? RADIOLOGY WITH ANESTHESIA N/A 01/23/2017  ? Procedure: RADIOLOGY WITH ANESTHESIA;  Surgeon: Luanne Bras, MD;  Location: [place];  Service: Radiology;  Laterality: N/A;  ? SPACE OAR INSTILLATION N/A 03/10/2018  ? Procedure: SPACE OAR INSTILLATION;  Surgeon: Franchot Gallo, MD;  Location: [facility];  Service: Urology;  Laterality: N/A;  ? TRANSTHORACIC ECHOCARDIOGRAM  11-20-2016   dr Stanford Breed  ? mild LVH,  apical hypertrophic cardiomyopathy, systolic function vigorous,  ef 65-70%,  grade 2 diastolic dysfunction/  mild AR, MR, and  PR/  moderate LAE and RAE/  mild to mod. TR/  moderate increase PASP 42mHg  ? TRANSURETHRAL RESECTION OF BLADDER TUMOR WITH MITOMYCIN-C N/A 12/09/2018  ? Procedure: TRANSURETHRAL RESECTION OF BLADDER TUMOR WITH GEMCITABINE GIVEN IN PACU;  Surgeon: DFranchot Gallo MD;  [facility]  Service: Urology;  Laterality: N/A;  ? TRANSURETHRAL RESECTION OF PROSTATE N/A 11/24/2016  ? Procedure: TRANSURETHRAL RESECTION OF THE PROSTATE (TURP);  Surgeon: DFranchot Gallo MD;  [facility];  Service: Urology;  Laterality: N/A;  ? ? ?Allergies ? ?Allergies  ?Allergen Reactions  ? Feraheme [Ferumoxytol] Anaphylaxis  ? ? ?History of Present Illness  ?  ?*** ? ?Home Medications  ?  ?Prior to Admission medications   ?Medication Sig Start Date End Date Taking? Authorizing Provider  ?CALCIUM CARBONATE-SIMETHICONE PO Take 1 tablet by mouth daily as needed (gas).    [provider]   ?cephALEXin (KEFLEX) 500 MG capsule Take 1 capsule (500 mg total) by mouth 2 (two) times daily. 11/01/19   Franchot Gallo, MD  ?digoxin (LANOXIN) 0.125 MG tablet Take 0.125 mg by mouth daily.    [provider]  ?Ergocalciferol (VITAMIN D2) 50 MCG (2000 UT) TABS Take 2,000 Units by mouth daily.    [provider]  ?feeding supplement, ENSURE ENLIVE, (ENSURE ENLIVE) LIQD Take 237 mLs by mouth 3 (three) times daily. 11/15/17   Lorella Nimrod, MD  ?feeding supplement, ENSURE ENLIVE, (ENSURE ENLIVE) LIQD Take 237 mLs by mouth 2 (two) times daily between meals. 06/18/19   Ardis Hughs, MD  ?finasteride (PROSCAR) 5 MG tablet Take 1 tablet (5 mg total) by mouth daily. 02/03/17   Angiulli, Lavon Paganini, PA-C  ?Iron-Vitamin C (VITRON-C) 65-125 MG TABS Take 1 tablet by mouth daily.    [provider]  ?mirabegron ER (MYRBETRIQ) 25 MG TB24 tablet Take 25 mg by mouth daily.    [provider]  ?nicotine (NICODERM CQ - DOSED IN MG/24 HOURS) 14 mg/24hr patch Place 14 mg onto the skin daily.    [provider]  ?nicotine polacrilex (NICORETTE) 2 MG gum Take 2 mg by mouth as needed for smoking cessation.    [provider]  ?phenazopyridine (PYRIDIUM) 200 MG tablet Take 200 mg by mouth 2 (two) times daily as needed for pain.    [provider]  ?polyethylene glycol (MIRALAX / GLYCOLAX) packet Take 17 g by mouth daily as needed. ?Patient taking differently: Take 17 g by mouth daily.  11/15/17   Lorella Nimrod, MD  ?rosuvastatin (CRESTOR) 10 MG tablet Take 10 mg by mouth daily.     [provider]  ?sennosides-docusate sodium (SENOKOT-S) 8.6-50 MG tablet Take 2 tablets by mouth at bedtime as needed for constipation.    [provider]  ?sodium chloride (OCEAN) 0.65 % SOLN nasal spray Place 1 spray into both nostrils 4 (four) times daily as needed (nasal dryness).    [provider]  ?tamsulosin (FLOMAX) 0.4 MG CAPS capsule Take 1 capsule (0.4 mg  total) by mouth daily. 02/03/17   Angiulli, Lavon Paganini, PA-C  ? ? ?Family History  ?  ?Family History  ?Problem Relation Age of Onset  ? Cancer Paternal Aunt   ?     unknown  ? ?He indicated that his mother is deceased. He indicated that his father is deceased. He indicated that his paternal aunt is deceased. ? ?Social History  ?  ?Social History  ? ?Socioeconomic History  ? Marital status: Single  ?  Spouse name: Not on file  ? Number of children: Not on file  ? Years of education: Not on file  ? Highest education level: Not on file  ?Occupational History  ?  Comment: disabled  ?Tobacco Use  ? Smoking status: Some Days  ?  Packs/day: 0.50  ?  Years: 51.00  ?  Pack years: 25.50  ?  Types: Cigarettes  ? Smokeless tobacco: Never  ?Vaping Use  ? Vaping Use:  Never used  ?Substance and Sexual Activity  ? Alcohol use: No  ? Drug use: No  ? Sexual activity: Not Currently  ?Other Topics Concern  ? Not on file  ?Social History Narrative  ? ** Merged History Encounter **  ?   ? Resides with sister, Osvaldo Angst  ?    ? ?Social Determinants of Health  ? ?Financial Resource Strain: Not on file  ?Food Insecurity: Not on file  ?Transportation Needs: Not on file  ?Physical Activity: Not on file  ?Stress: Not on file  ?Social Connections: Not on file  ?Intimate Partner Violence: Not on file  ?  ? ?Review of Systems  ?  ?General:  No chills, fever, night sweats or weight changes.  ?Cardiovascular:  No chest pain, dyspnea on exertion, edema, orthopnea, palpitations, paroxysmal nocturnal dyspnea. ?Dermatological: No rash, lesions/masses ?Respiratory: No cough, dyspnea ?Urologic: No hematuria, dysuria ?Abdominal:   No nausea, vomiting, diarrhea, bright red blood per rectum, melena, or hematemesis ?Neurologic:  No visual changes, wkns, changes in mental status. ?All other systems reviewed and are otherwise negative except as noted above. ? ?Physical Exam  ?  ?VS:  There were no vitals taken for this visit. , BMI There is no height or weight  on file to calculate BMI. ?GEN: Well nourished, well developed, in no acute distress. ?HEENT: normal. ?Neck: Supple, no JVD, carotid bruits, or masses. ?Cardiac: RRR, no murmurs, rubs, or gallops. No club

## 2021-08-05 ENCOUNTER — Ambulatory Visit: Payer: Medicare (Managed Care) | Admitting: General Practice

## 2021-09-03 NOTE — Progress Notes (Signed)
? ?Cardiology Clinic Note  ? ?Patient Name: Ian Hill ?Date of Encounter: 09/05/2021 ? ?Primary Care Provider:  Linda Hedges, NP (Inactive) ?Primary Cardiologist:  Kirk Ruths, MD ? ?Patient Profile  ?  ?Ian Hill 71 year old male presents the clinic today for follow-up evaluation of his essential hypertension and hypertrophic obstructive cardiomyopathy. ? ?Past Medical History  ?  ?Past Medical History:  ?Diagnosis Date  ? Anticoagulant long-term use   ? eliquis  ? Bladder tumor   ? BPH (benign prostatic hyperplasia)   ? CKD (chronic kidney disease), stage II   ? Diverticulosis   ? Dyslipidemia   ? History of CVA with residual deficit 01/28/2017  ? acute right MCA infarc with M2 embolic occlusion w/p  tPA and mechnical thrombectomy---  residual left side weakness  ? History of external beam radiation therapy   ? 03-23-2018  to 05-03-2018  prostate cancer  ? Hypertension   ? Hypertensive heart disease   ? Hypertrophic cardiomegaly, apical variant   ? Hypertrophic obstructive cardiomyopathy with diastolic heart failure ([place])   ? cardiologist-- dr Stanford Breed  ? Mitral regurgitation   ? Other problems related to education and literacy   ? can not read  ? Prostate cancer [facility]) urologist-  dr dahlstedt/  oncologsit-- dr Tammi Klippel  ? dx 07/ 2018---  Stage T1a,  Gleason 4+3--- completed external radiation therapy 05-03-2018  ? Stroke [facility]) 2019  ? ?Past Surgical History:  ?Procedure Laterality Date  ? aspergilloma resection    ? LUL, s/p resection Feb 1991 SISTER NOT SURE OF THIS  ? CARDIOVASCULAR STRESS TEST  11-19-2016  dr Stanford Breed  ? Low risk nuclear study/  normal resting and stress perfusion with no ischemia or infarction/  EF not done due to PVCIs , TID borderline, 1.2 significant LVH  ? CYSTOSCOPY W/ RETROGRADES Bilateral 10/31/2019  ? Procedure: CYSTOSCOPY WITH ATTEMPTED RETROGRADE PYELOGRAM;  Surgeon: Franchot Gallo, MD;  Location: [facility];  Service: Urology;  Laterality: Bilateral;  1 HR  ?  CYSTOSCOPY WITH BIOPSY N/A 06/16/2019  ? Procedure: TRANSURETHERAL RESECTION OF BLADDER TUMOR.;  Surgeon: Franchot Gallo, MD;  Location: [facility];  Service: Urology;  Laterality: N/A;  30 MINS  ? CYSTOSCOPY WITH BIOPSY N/A 10/31/2019  ? Procedure: TURBT;  Surgeon: Franchot Gallo, MD;  Location: [facility];  Service: Urology;  Laterality: N/A;  ? GOLD SEED IMPLANT N/A 03/10/2018  ? Procedure: GOLD SEED IMPLANT;  Surgeon: Franchot Gallo, MD;  Location: [facility];  Service: Urology;  Laterality: N/A;  ? IR PERCUTANEOUS ART THROMBECTOMY/INFUSION INTRACRANIAL INC DIAG ANGIO  01/23/2017  ? RADIOLOGY WITH ANESTHESIA N/A 01/23/2017  ? Procedure: RADIOLOGY WITH ANESTHESIA;  Surgeon: Luanne Bras, MD;  Location: [facility];  Service: Radiology;  Laterality: N/A;  ? SPACE OAR INSTILLATION N/A 03/10/2018  ? Procedure: SPACE OAR INSTILLATION;  Surgeon: Franchot Gallo, MD;  Location: [facility];  Service: Urology;  Laterality: N/A;  ? TRANSTHORACIC ECHOCARDIOGRAM  11-20-2016   dr Stanford Breed  ? mild LVH,  apical hypertrophic cardiomyopathy, systolic function vigorous,  ef 65-70%,  grade 2 diastolic dysfunction/  mild AR, MR, and  PR/  moderate LAE and RAE/  mild to mod. TR/  moderate increase PASP 67mHg  ? TRANSURETHRAL RESECTION OF BLADDER TUMOR WITH MITOMYCIN-C N/A 12/09/2018  ? Procedure: TRANSURETHRAL RESECTION OF BLADDER TUMOR WITH GEMCITABINE GIVEN IN PACU;  Surgeon: DFranchot Gallo MD;  [facility]  Service: Urology;  Laterality: N/A;  ? TRANSURETHRAL RESECTION OF PROSTATE  N/A 11/24/2016  ? Procedure: TRANSURETHRAL RESECTION OF THE PROSTATE (TURP);  Surgeon: Franchot Gallo, MD;  Location: [facility];  Service: Urology;  Laterality: N/A;  ? ? ?Allergies ? ?Allergies  ?Allergen Reactions  ? Feraheme [Ferumoxytol] Anaphylaxis  ? ? ?History of Present Illness  ?  ?Ian Hill has a PMH of essential hypertension, hypertrophic obstructive  cardiomyopathy, CVA, paroxysmal atrial fibrillation, lung nodule, acute respiratory failure, CKD, BPH, tobacco abuse, and hyperlipidemia.  An abdominal ultrasound 8/21 showed no aneurysm.  His carotid Dopplers 1-39% bilaterally.  He wore a cardiac event monitor that showed nonsustained ventricular tachycardia 7 beats.  He underwent stress testing which showed possible inferior hypokinesis but systolic blood pressure decreased with exercise.  He was referred for ICD.  ICD therapy was reviewed but family ultimately declined implant and expressed understanding of risk of sudden cardiac death.  Stress testing 2022-12-11 showed normal perfusion.  He was admitted 9/18 with CVA.  He was noted to have a right vertebral artery occlusion and underwent percutaneous intervention.  During his admission he was noted to have atrial fibrillation/flutter.  He was placed on apixaban.  His echocardiogram 10/20 showed normal LV function, severe ventricular hypertrophy and more prominent septum/apex, and biatrial enlargement.  He was also noted to have mitral valve regurgitation and severe tricuspid regurgitation with moderate pulmonary hypertension.  He was also noted to have intermittent heart block during previous hospitalization.  Pacemaker therapy was discussed but watchful waiting was ultimately decided upon.  His beta-blocker therapy was discontinued. ? ?He was seen by Dr. Stanford Breed on 03/09/2020.  During that time he denied dyspnea, chest pain, palpitations, syncope, and bleeding issues. ? ?He presents to the clinic today for evaluation states he has been physically active.  He has been trying to walk regularly.  (NYHA class I-II) However, he does not like to walk in the inclement weather.  We reviewed his most recent lipid panel from 03/09/2019.  We also discussed his hypertrophic cardiomyopathy.  He has not noticed any lower extremity edema.  He denies irregular heartbeats and palpitations.  He reports that he sleeps well and has a  good appetite.  He reports that [place] and had 12 brothers and sisters.  I will order a CBC, BMP, fasting lipids and LFTs.  We will plan follow-up for 12 months.  He asked that we put this date on his AVS so that his sister will be informed. ? ?Today he denies chest pain, shortness of breath, lower extremity edema, fatigue, palpitations, melena, hematuria, hemoptysis, diaphoresis, weakness, presyncope, syncope, orthopnea, and PND. ? ? ? ?Home Medications  ?  ?Prior to Admission medications   ?Medication Sig Start Date End Date Taking? Authorizing Provider  ?CALCIUM CARBONATE-SIMETHICONE PO Take 1 tablet by mouth daily as needed (gas).    [provider]  ?cephALEXin (KEFLEX) 500 MG capsule Take 1 capsule (500 mg total) by mouth 2 (two) times daily. 11/01/19   Franchot Gallo, MD  ?digoxin (LANOXIN) 0.125 MG tablet Take 0.125 mg by mouth daily.    [provider]  ?Ergocalciferol (VITAMIN D2) 50 MCG (2000 UT) TABS Take 2,000 Units by mouth daily.    [provider]  ?feeding supplement, ENSURE ENLIVE, (ENSURE ENLIVE) LIQD Take 237 mLs by mouth 3 (three) times daily. 11/15/17   Lorella Nimrod, MD  ?feeding supplement, ENSURE ENLIVE, (ENSURE ENLIVE) LIQD Take 237 mLs by mouth 2 (two) times daily between meals. 06/18/19   Ardis Hughs,  MD  ?finasteride (PROSCAR) 5 MG tablet Take 1 tablet (5 mg total) by mouth daily. 02/03/17   Angiulli, Lavon Paganini, PA-C  ?Iron-Vitamin C (VITRON-C) 65-125 MG TABS Take 1 tablet by mouth daily.    [provider]  ?mirabegron ER (MYRBETRIQ) 25 MG TB24 tablet Take 25 mg by mouth daily.    [provider]  ?nicotine (NICODERM CQ - DOSED IN MG/24 HOURS) 14 mg/24hr patch Place 14 mg onto the skin daily.    [provider]  ?nicotine polacrilex (NICORETTE) 2 MG gum Take 2 mg by mouth as needed for smoking cessation.    [provider]  ?phenazopyridine (PYRIDIUM) 200 MG tablet Take 200 mg by mouth 2 (two) times daily  as needed for pain.    [provider]  ?polyethylene glycol (MIRALAX / GLYCOLAX) packet Take 17 g by mouth daily as needed. ?Patient taking differently: Take 17 g by mouth daily.  11/15/17   Amin

## 2021-09-05 ENCOUNTER — Encounter: Payer: Self-pay | Admitting: General Practice

## 2021-09-05 ENCOUNTER — Ambulatory Visit (INDEPENDENT_AMBULATORY_CARE_PROVIDER_SITE_OTHER): Payer: Medicare (Managed Care) | Admitting: General Practice

## 2021-09-05 VITALS — BP 130/88 | HR 82 | Wt 113.2 lb

## 2021-09-05 DIAGNOSIS — I517 Cardiomegaly: Secondary | ICD-10-CM

## 2021-09-05 DIAGNOSIS — I480 Paroxysmal atrial fibrillation: Secondary | ICD-10-CM | POA: Diagnosis not present

## 2021-09-05 DIAGNOSIS — I10 Essential (primary) hypertension: Secondary | ICD-10-CM

## 2021-09-05 NOTE — Patient Instructions (Signed)
Medication Instructions:  ?The current medical regimen is effective;  continue present plan and medications as directed. Please refer to the Current Medication list given to you today.  ? ?*If you need a refill on your cardiac medications before your next appointment, please call your pharmacy* ? ?Lab Work:    ?BMET, CBC,LIPID,LFT TODAY     ? ?If you have labs (blood work) drawn today and your tests are completely normal, you will receive your results only by: ?MyChart Message (if you have MyChart) OR  A paper copy in the mail ?If you have any lab test that is abnormal or we need to change your treatment, we will call you to review the results. ? ?Follow-Up: ?Your next appointment:  12 month(s) In Person with Kirk Ruths, MD   ? ?Please call our office 2 months in advance to schedule this appointment ([place] 2024 TO SCHEDULE April-MAY 2024 APPOINTMENT): ? ?At [facility], you and your health needs are our priority.  As part of our continuing mission to provide you with exceptional heart care, we have created designated Provider Care Teams.  These Care Teams include your primary Cardiologist (physician) and Advanced Practice Providers (APPs -  Physician Assistants and Nurse Practitioners) who all work together to provide you with the care you need, when you need it. ? ?We recommend signing up for the patient portal called "MyChart".  Sign up information is provided on this After Visit Summary.  MyChart is used to connect with patients for Virtual Visits (Telemedicine).  Patients are able to view lab/test results, encounter notes, upcoming appointments, etc.  Non-urgent messages can be sent to your provider as well.   ?To learn more about what you can do with MyChart, go to [email or web address].   ? ? ?Important Information About Sugar ? ? ? ? ? ? ?     ?

## 2021-09-06 LAB — BASIC METABOLIC PANEL
BUN/Creatinine Ratio: 19 (ref 10–24)
BUN: 24 mg/dL (ref 8–27)
CO2: 24 mmol/L (ref 20–29)
Calcium: 10.2 mg/dL (ref 8.6–10.2)
Chloride: 100 mmol/L (ref 96–106)
Creatinine, Ser: 1.24 mg/dL (ref 0.76–1.27)
Glucose: 89 mg/dL (ref 70–99)
Potassium: 4.3 mmol/L (ref 3.5–5.2)
Sodium: 142 mmol/L (ref 134–144)
eGFR: 62 mL/min/{1.73_m2} (ref >59)

## 2021-09-06 LAB — HEPATIC FUNCTION PANEL
ALT: 15 IU/L (ref 0–44)
AST: 22 IU/L (ref 0–40)
Albumin: 5.1 g/dL — ABNORMAL HIGH (ref 3.7–4.7)
Alkaline Phosphatase: 105 IU/L (ref 44–121)
Bilirubin Total: 1 mg/dL (ref 0.0–1.2)
Bilirubin, Direct: 0.27 mg/dL (ref 0.00–0.40)
Total Protein: 7.7 g/dL (ref 6.0–8.5)

## 2021-09-06 LAB — CBC
Hematocrit: 46.1 % (ref 37.5–51.0)
Hemoglobin: 14.9 g/dL (ref 13.0–17.7)
MCH: 29.6 pg (ref 26.6–33.0)
MCHC: 32.3 g/dL (ref 31.5–35.7)
MCV: 92 fL (ref 79–97)
Platelets: 122 10*3/uL — ABNORMAL LOW (ref 150–450)
RBC: 5.03 x10E6/uL (ref 4.14–5.80)
RDW: 13 % (ref 11.6–15.4)
WBC: 4.4 10*3/uL (ref 3.4–10.8)

## 2021-09-06 LAB — LIPID PANEL
Chol/HDL Ratio: 2.8 ratio (ref 0.0–5.0)
Cholesterol, Total: 182 mg/dL (ref 100–199)
HDL: 65 mg/dL (ref >39)
LDL Chol Calc (NIH): 107 mg/dL — ABNORMAL HIGH (ref 0–99)
Triglycerides: 54 mg/dL (ref 0–149)
VLDL Cholesterol Cal: 10 mg/dL (ref 5–40)

## 2021-09-06 NOTE — Progress Notes (Signed)
LCSW was notified by front desk staff that pt ride had not arrived. ?Pt is a Pharmacist, hospital. Front desk had contacted [employer] and they shared multiple times that someone was on the way, however clinic about to close. Pt without cell phone, he is unclear about whether or not the driver comes up when he goes to appointments. Attempted to reach pt sister for clarity since she is listed contacts. No answer, voicemail left. In the mean time, LCSW was able to finally reach the on call [place], she contacted the driver and pt was picked up from clinic.  ? ?[name], MSW, LCSW ?Clinical Social Worker II ? Heart/Vascular Care Navigation  ?[phone]- work cell phone (preferred) ?[phone]- desk phone ? ?

## 2022-08-21 ENCOUNTER — Ambulatory Visit: Payer: Medicare (Managed Care) | Admitting: General Practice

## 2022-09-15 NOTE — Progress Notes (Unsigned)
Cardiology Clinic Note   Patient Name: Ian Hill Date of Encounter: 09/15/2022  Primary Care Provider:  [facility] Primary Cardiologist:  Olga Millers, MD  Patient Profile    Ian Hill 72 year old male presents the clinic today for follow-up evaluation of his essential hypertension and hypertrophic obstructive cardiomyopathy.  Past Medical History    Past Medical History:  Diagnosis Date   Anticoagulant long-term use    eliquis   Bladder tumor    BPH (benign prostatic hyperplasia)    CKD (chronic kidney disease), stage II    Diverticulosis    Dyslipidemia    History of CVA with residual deficit 01/28/2017   acute right MCA infarc with M2 embolic occlusion w/p  tPA and mechnical thrombectomy---  residual left side weakness   History of external beam radiation therapy    03-23-2018  to 05-03-2018  prostate cancer   Hypertension    Hypertensive heart disease    Hypertrophic cardiomegaly, apical variant    Hypertrophic obstructive cardiomyopathy with diastolic heart failure [facility])    cardiologist-- dr Jens Som   Mitral regurgitation    Other problems related to education and literacy    can not read   Prostate cancer [facility]) urologist-  dr dahlstedt/  oncologsit-- dr Kathrynn Running   dx 07/ 2018---  Stage T1a,  Gleason 4+3--- completed external radiation therapy 05-03-2018   Stroke [facility]) 2019   Past Surgical History:  Procedure Laterality Date   aspergilloma resection     LUL, s/p resection Feb 1991 SISTER NOT SURE OF THIS   CARDIOVASCULAR STRESS TEST  11-19-2016  dr Jens Som   Low risk nuclear study/  normal resting and stress perfusion with no ischemia or infarction/  EF not done due to PVCIs , TID borderline, 1.2 significant LVH   CYSTOSCOPY W/ RETROGRADES Bilateral 10/31/2019   Procedure: CYSTOSCOPY WITH ATTEMPTED RETROGRADE PYELOGRAM;  Surgeon: Marcine Matar, MD;  Location: [facility];  Service: Urology;  Laterality:  Bilateral;  1 HR   CYSTOSCOPY WITH BIOPSY N/A 06/16/2019   Procedure: TRANSURETHERAL RESECTION OF BLADDER TUMOR.;  Surgeon: Marcine Matar, MD;  Location: [facility];  Service: Urology;  Laterality: N/A;  30 MINS   CYSTOSCOPY WITH BIOPSY N/A 10/31/2019   Procedure: TURBT;  Surgeon: Marcine Matar, MD;  Location: [facility];  Service: Urology;  Laterality: N/A;   GOLD SEED IMPLANT N/A 03/10/2018   Procedure: GOLD SEED IMPLANT;  Surgeon: Marcine Matar, MD;  Location: [facility];  Service: Urology;  Laterality: N/A;   IR PERCUTANEOUS ART THROMBECTOMY/INFUSION INTRACRANIAL INC DIAG ANGIO  01/23/2017   RADIOLOGY WITH ANESTHESIA N/A 01/23/2017   Procedure: RADIOLOGY WITH ANESTHESIA;  Surgeon: Julieanne Cotton, MD;  Location: [facility];  Service: Radiology;  Laterality: N/A;   SPACE OAR INSTILLATION N/A 03/10/2018   Procedure: SPACE OAR INSTILLATION;  Surgeon: Marcine Matar, MD;  Location: [facility];  Service: Urology;  Laterality: N/A;   TRANSTHORACIC ECHOCARDIOGRAM  11-20-2016   dr Jens Som   mild LVH,  apical hypertrophic cardiomyopathy, systolic function vigorous,  ef 65-70%,  grade 2 diastolic dysfunction/  mild AR, MR, and  PR/  moderate LAE and RAE/  mild to mod. TR/  moderate increase PASP   TRANSURETHRAL RESECTION OF BLADDER TUMOR WITH MITOMYCIN-C N/A 12/09/2018   Procedure: TRANSURETHRAL RESECTION OF BLADDER TUMOR WITH GEMCITABINE GIVEN IN PACU;  Surgeon: Marcine Matar, MD;  Location: [facility];  Service: Urology;  Laterality: N/A;   TRANSURETHRAL  RESECTION OF PROSTATE N/A 11/24/2016   Procedure: TRANSURETHRAL RESECTION OF THE PROSTATE (TURP);  Surgeon: Marcine Matar, MD;  Location[facility];  Service: Urology;  Laterality: N/A;    Allergies  Allergies  Allergen Reactions   Feraheme [Ferumoxytol] Anaphylaxis    History of Present Illness    Ian Hill has a PMH of essential hypertension,  hypertrophic obstructive cardiomyopathy, CVA, paroxysmal atrial fibrillation, lung nodule, acute respiratory failure, CKD, BPH, tobacco abuse, and hyperlipidemia.  An abdominal ultrasound 8/21 showed no aneurysm.  His carotid Dopplers 1-39% bilaterally.  He wore a cardiac event monitor that showed nonsustained ventricular tachycardia 7 beats.  He underwent stress testing which showed possible inferior hypokinesis but systolic blood pressure decreased with exercise.  He was referred for ICD.  ICD therapy was reviewed but family ultimately declined implant and expressed understanding of risk of sudden cardiac death.  Stress testing 2022-12-12 showed normal perfusion.  He was admitted 9/18 with CVA.  He was noted to have a right vertebral artery occlusion and underwent percutaneous intervention.  During his admission he was noted to have atrial fibrillation/flutter.  He was placed on apixaban.  His echocardiogram 10/20 showed normal LV function, severe ventricular hypertrophy and more prominent septum/apex, and biatrial enlargement.  He was also noted to have mitral valve regurgitation and severe tricuspid regurgitation with moderate pulmonary hypertension.  He was also noted to have intermittent heart block during previous hospitalization.  Pacemaker therapy was discussed but watchful waiting was ultimately decided upon.  His beta-blocker therapy was discontinued.  He was seen by Dr. Jens Som on 03/09/2020.  During that time he denied dyspnea, chest pain, palpitations, syncope, and bleeding issues.  He presented to the clinic 09/05/2021 for evaluation stated he had been physically active.  He had been trying to walk regularly.  (NYHA class I-II) However, he did not like to walk in the inclement weather.  We reviewed his most recent lipid panel from 03/09/2019.  We also discussed his hypertrophic cardiomyopathy.  He had not noticed any lower extremity edema.  He denied irregular heartbeats and palpitations.  He reported  that [place] and had 12 brothers and sisters.  I will ordered  CBC, BMP, fasting lipids and LFTs.  These were stable.  He presents to the clinic today for follow-up evaluation and states***.   Today he denies chest pain, shortness of breath, lower extremity edema, fatigue, palpitations, melena, hematuria, hemoptysis, diaphoresis, weakness, presyncope, syncope, orthopnea, and PND.    Home Medications    Prior to Admission medications   Medication Sig Start Date End Date Taking? Authorizing Provider  CALCIUM CARBONATE-SIMETHICONE PO Take 1 tablet by mouth daily as needed (gas).    [provider]  cephALEXin (KEFLEX) 500 MG capsule Take 1 capsule (500 mg total) by mouth 2 (two) times daily. 11/01/19   Marcine Matar, MD  digoxin (LANOXIN) 0.125 MG tablet Take 0.125 mg by mouth daily.    [provider]  Ergocalciferol (VITAMIN D2) 50 MCG (2000 UT) TABS Take 2,000 Units by mouth daily.    [provider]  feeding supplement, ENSURE ENLIVE, (ENSURE ENLIVE) LIQD Take 237 mLs by mouth 3 (three) times daily. 11/15/17   Arnetha Courser, MD  feeding supplement, ENSURE ENLIVE, (ENSURE ENLIVE) LIQD Take 237 mLs by mouth 2 (two) times daily between meals. 06/18/19   Crist Fat, MD  finasteride (PROSCAR) 5 MG tablet Take 1 tablet (5 mg total) by mouth daily. 02/03/17  Angiulli, Mcarthur Rossetti, PA-C  Iron-Vitamin C (VITRON-C) 65-125 MG TABS Take 1 tablet by mouth daily.    [provider]  mirabegron ER (MYRBETRIQ) 25 MG TB24 tablet Take 25 mg by mouth daily.    [provider]  nicotine (NICODERM CQ - DOSED IN MG/24 HOURS) 14 mg/24hr patch Place 14 mg onto the skin daily.    [provider]  nicotine polacrilex (NICORETTE) 2 MG gum Take 2 mg by mouth as needed for smoking cessation.    [provider]  phenazopyridine (PYRIDIUM) 200 MG tablet Take 200 mg by mouth 2 (two) times daily as needed for pain.    [provider]   polyethylene glycol (MIRALAX / GLYCOLAX) packet Take 17 g by mouth daily as needed. Patient taking differently: Take 17 g by mouth daily.  11/15/17   Arnetha Courser, MD  rosuvastatin (CRESTOR) 10 MG tablet Take 10 mg by mouth daily.     [provider]  sennosides-docusate sodium (SENOKOT-S) 8.6-50 MG tablet Take 2 tablets by mouth at bedtime as needed for constipation.    [provider]  sodium chloride (OCEAN) 0.65 % SOLN nasal spray Place 1 spray into both nostrils 4 (four) times daily as needed (nasal dryness).    [provider]  tamsulosin (FLOMAX) 0.4 MG CAPS capsule Take 1 capsule (0.4 mg total) by mouth daily. 02/03/17   Angiulli, Mcarthur Rossetti, PA-C    Family History    Family History  Problem Relation Age of Onset   Cancer Paternal Aunt        unknown   He indicated that his mother is deceased. He indicated that his father is deceased. He indicated that his paternal aunt is deceased.  Social History    Social History   Socioeconomic History   Marital status: Single    Spouse name: Not on file   Number of children: Not on file   Years of education: Not on file   Highest education level: Not on file  Occupational History    Comment: disabled  Tobacco Use   Smoking status: Some Days    Packs/day: 0.50    Years: 51.00    Additional pack years: 0.00    Total pack years: 25.50    Types: Cigarettes   Smokeless tobacco: Never  Vaping Use   Vaping Use: Never used  Substance and Sexual Activity   Alcohol use: No   Drug use: No   Sexual activity: Not Currently  Other Topics Concern   Not on file  Social History Narrative   ** Merged History Encounter **      Resides with sister, Daneil Dan       Social Determinants of Health   Financial Resource Strain: Not on file  Food Insecurity: Not on file  Transportation Needs: No Transportation Needs (09/06/2021)   PRAPARE - Administrator, Civil Service (Medical): No    Lack of  Transportation (Non-Medical): No  Physical Activity: Unknown (03/11/2019)   Exercise Vital Sign    Days of Exercise per Week: Patient declined    Minutes of Exercise per Session: Patient declined  Stress: Not on file  Social Connections: Unknown (03/11/2019)   Social Connection and Isolation Panel [NHANES]    Frequency of Communication with Friends and Family: More than three times a week    Frequency of Social Gatherings with Friends and Family: More than three times a week    Attends Religious Services: Patient declined  Active Member of Clubs or Organizations: No    Attends Banker Meetings: Never    Marital Status: Never married  Intimate Partner Violence: Not At Risk (03/11/2019)   Humiliation, Afraid, Rape, and Kick questionnaire    Fear of Current or Ex-Partner: No    Emotionally Abused: No    Physically Abused: No    Sexually Abused: No     Review of Systems    General:  No chills, fever, night sweats or weight changes.  Cardiovascular:  No chest pain, dyspnea on exertion, edema, orthopnea, palpitations, paroxysmal nocturnal dyspnea. Dermatological: No rash, lesions/masses Respiratory: No cough, dyspnea Urologic: No hematuria, dysuria Abdominal:   No nausea, vomiting, diarrhea, bright red blood per rectum, melena, or hematemesis Neurologic:  No visual changes, wkns, changes in mental status. All other systems reviewed and are otherwise negative except as noted above.  Physical Exam    VS:  There were no vitals taken for this visit. , BMI There is no height or weight on file to calculate BMI. GEN: Well nourished, well developed, in no acute distress. HEENT: normal. Neck: Supple, no JVD, carotid bruits, or masses. Cardiac: RRR, no murmurs, rubs, or gallops. No clubbing, cyanosis, edema.  Radials/DP/PT 2+ and equal bilaterally.  Respiratory:  Respirations regular and unlabored, clear to auscultation bilaterally. GI: Soft, nontender, nondistended, BS + x  4. MS: no deformity or atrophy. Skin: warm and dry, no rash. Neuro:  Strength and sensation are intact. Psych: Normal affect.  Accessory Clinical Findings    Recent Labs: No results found for requested labs within last 365 days.   Recent Lipid Panel    Component Value Date/Time   CHOL 182 09/05/2021 1550   TRIG 54 09/05/2021 1550   HDL 65 09/05/2021 1550   CHOLHDL 2.8 09/05/2021 1550   CHOLHDL 1.9 03/09/2019 1003   VLDL 6 03/09/2019 1003   LDLCALC 107 (H) 09/05/2021 1550    ECG personally reviewed by me today-atrial fibrillation incomplete right bundle branch block voltage criteria for LVH 82 bpm- No acute changes  Echocardiogram 04/03/2020  IMPRESSIONS     1. Hypertrophic cardiomyopathy, apical variant. Recommend limited  Definity contrast study to screen for apical pouch, not performed.  Midcavitary obstruction by color flow Doppler.   2. Left ventricular ejection fraction, by estimation, is 60 to 65%. The  left ventricle has normal function. The left ventricle has no regional  wall motion abnormalities. There is severe left ventricular hypertrophy of  the apical segment. Left  ventricular diastolic parameters are indeterminate. Elevated left  ventricular end-diastolic pressure. The E/e' is 22.   3. Right ventricular systolic function is moderately reduced. The right  ventricular size is normal. Moderately increased right ventricular wall  thickness. There is moderately elevated pulmonary artery systolic  pressure. The estimated right ventricular  systolic pressure is 57.2 mmHg.   4. Left atrial size was severely dilated.   5. Right atrial size was severely dilated.   6. The mitral valve is grossly normal. Mild to moderate mitral valve  regurgitation.   7. Tricuspid valve regurgitation is moderate to severe.   8. The aortic valve is tricuspid. There is mild calcification of the  aortic valve. Aortic valve regurgitation is mild. No aortic stenosis is  present.    9. The inferior vena cava is normal in size with greater than 50%  respiratory variability, suggesting right atrial pressure of 3 mmHg.  Assessment & Plan   1.  Hypertrophic cardiomyopathy-remains physically active.  No  increased DOE.   Continues to walk regularly .  Apical variant noted on echocardiogram 06/04/2019.  He was previously offered ICD placement but family declined. Continue digoxin Order digoxin level  Essential hypertension-BP today***130/88.  Heart healthy low-sodium diet-salty 6 reviewed Increase physical activity as tolerated Maintain blood pressure log  Paroxysmal atrial fibrillation-EKG today shows.  Denies episodes of accelerated or irregular heartbeat.  Previously beta-blocker therapy (atenolol) discontinued due to bradycardia.  Compliant with apixaban and denies bleeding issues. Continue apixaban Maintain physical activity Avoid triggers caffeine, chocolate, EtOH, dehydration etc.  Hyperlipidemia-LDL 43***on 03/09/19 Continue rosuvastatin Heart healthy low-sodium high-fiber diet Increase physical activity as tolerated Repeat fasting lipids and LFTs  Tobacco abuse-continues to smoke 1/2 pack per day.  Again, tobacco cessation strongly recommended. Continue nicotine patch, nicotine gum Tobacco cessation information reviewed  Disposition: Follow-up with Dr. Jens Som or me in 12 months.  Thomasene Ripple. Altair Stanko NP-C    09/15/2022, 2:48 PM [facility] [address] Office [phone] Fax [phone]  Notice: This dictation was prepared with Dragon dictation along with smaller phrase technology. Any transcriptional errors that result from this process are unintentional and may not be corrected upon review.  I spent 14***minutes examining this patient, reviewing medications, and using patient centered shared decision making involving her cardiac care.  Prior to her visit I spent greater than 20 minutes reviewing her past medical  history,  medications, and prior cardiac tests.

## 2022-09-17 ENCOUNTER — Encounter: Payer: Self-pay | Admitting: General Practice

## 2022-09-17 ENCOUNTER — Ambulatory Visit: Payer: Medicare (Managed Care) | Attending: General Practice | Admitting: General Practice

## 2022-09-17 VITALS — BP 128/80 | HR 88 | Ht 66.0 in | Wt 109.2 lb

## 2022-09-17 DIAGNOSIS — I480 Paroxysmal atrial fibrillation: Secondary | ICD-10-CM

## 2022-09-17 DIAGNOSIS — I10 Essential (primary) hypertension: Secondary | ICD-10-CM

## 2022-09-17 DIAGNOSIS — I517 Cardiomegaly: Secondary | ICD-10-CM

## 2022-09-17 DIAGNOSIS — Z720 Tobacco use: Secondary | ICD-10-CM

## 2022-09-17 DIAGNOSIS — I63511 Cerebral infarction due to unspecified occlusion or stenosis of right middle cerebral artery: Secondary | ICD-10-CM

## 2022-09-17 NOTE — Patient Instructions (Signed)
Medication Instructions:  The current medical regimen is effective;  continue present plan and medications as directed. Please refer to the Current Medication list given to you today.  *If you need a refill on your cardiac medications before your next appointment, please call your pharmacy*  Lab Work: FASTING LIPID AND LFT If you have labs (blood work) drawn today and your tests are completely normal, you will receive your results only by:  MyChart Message (if you have MyChart) OR  A paper copy in the mail If you have any lab test that is abnormal or we need to change your treatment, we will call you to review the results.  Testing/Procedures:   Follow-Up: At [facility], you and your health needs are our priority.  As part of our continuing mission to provide you with exceptional heart care, we have created designated Provider Care Teams.  These Care Teams include your primary Cardiologist (physician) and Advanced Practice Providers (APPs -  Physician Assistants and Nurse Practitioners) who all work together to provide you with the care you need, when you need it.  We recommend signing up for the patient portal called "MyChart".  Sign up information is provided on this After Visit Summary.  MyChart is used to connect with patients for Virtual Visits (Telemedicine).  Patients are able to view lab/test results, encounter notes, upcoming appointments, etc.  Non-urgent messages can be sent to your provider as well.   To learn more about what you can do with MyChart, go to [email or web address].    Your next appointment:   12 month(s)  Provider:   Olga Millers, MD     Other Instructions

## 2023-07-31 ENCOUNTER — Other Ambulatory Visit (HOSPITAL_COMMUNITY): Payer: Self-pay | Admitting: Urology

## 2023-07-31 DIAGNOSIS — R9721 Rising PSA following treatment for malignant neoplasm of prostate: Secondary | ICD-10-CM

## 2023-08-20 ENCOUNTER — Encounter (HOSPITAL_COMMUNITY)
Admission: RE | Admit: 2023-08-20 | Discharge: 2023-08-20 | Disposition: A | Discharge status: Home / Self Care | Payer: Medicare (Managed Care) | Source: Ambulatory Visit | Attending: Urology | Admitting: Urology

## 2023-08-20 DIAGNOSIS — R9721 Rising PSA following treatment for malignant neoplasm of prostate: Secondary | ICD-10-CM | POA: Diagnosis present

## 2023-08-20 MED ORDER — FLOTUFOLASTAT F 18 GALLIUM 296-5846 MBQ/ML IV SOLN
8.0000 | Freq: Once | INTRAVENOUS | Status: AC
Start: 2023-08-20 — End: 2023-08-20
  Administered 2023-08-20: 7.88 via INTRAVENOUS
  Filled 2023-08-20: qty 8

## 2024-05-16 NOTE — Progress Notes (Unsigned)
 "    HPI: FU hypertrophic cardiomyopathy. Abdominal ultrasound in August of 2011 showed no aneurysm. Carotid Dopplers showed 0-39% bilateral stenosis. Holter monitor showed nonsustained ventricular tachycardia (7 beats). Stress echocardiogram showed possible inferior hypokinesis but systolic blood pressure decreased with exercise. We discussed referral for ICD but the family ultimately declined understanding risk of sudden cardiac death. Nuclear study 08/07/2018showed normal perfusion. Patient was admitted September 2018 and had a CVA. Patient was noted to have occluded right vertebral artery and underwent percutaneous intervention.  During that admission he was noted to have atrial fibrillation/flutter. He was placed on apixaban . Patient had intermittent complete heart block during previous hospitalization.  Pacemaker was discussed but watchful waiting was the ultimate decision.  Beta-blocker discontinued. Echocardiogram November 2021 showed hypertrophic cardiomyopathy, apical variant, ejection fraction 60 to 65%, moderate RV dysfunction, moderate pulmonary hypertension, severe biatrial enlargement, mild to moderate mitral regurgitation, moderate to severe tricuspid regurgitation, mild aortic insufficiency.  Since last seen,    Current Outpatient Medications  Medication Sig Dispense Refill   CALCIUM  CARBONATE-SIMETHICONE PO Take 1 tablet by mouth daily as needed (gas).     cephALEXin  (KEFLEX ) 500 MG capsule Take 1 capsule (500 mg total) by mouth 2 (two) times daily. 10 capsule 0   digoxin  (LANOXIN ) 0.125 MG tablet Take 0.125 mg by mouth daily.     Ergocalciferol  (VITAMIN D2) 50 MCG (2000 UT) TABS Take 2,000 Units by mouth daily.     feeding supplement, ENSURE ENLIVE, (ENSURE ENLIVE) LIQD Take 237 mLs by mouth 3 (three) times daily. 237 mL 12   feeding supplement, ENSURE ENLIVE, (ENSURE ENLIVE) LIQD Take 237 mLs by mouth 2 (two) times daily between meals. 237 mL 12   finasteride  (PROSCAR ) 5 MG tablet  Take 1 tablet (5 mg total) by mouth daily. 30 tablet 0   Iron-Vitamin C (VITRON-C) 65-125 MG TABS Take 1 tablet by mouth daily.     mirabegron  ER (MYRBETRIQ ) 25 MG TB24 tablet Take 25 mg by mouth daily.     nicotine  (NICODERM CQ  - DOSED IN MG/24 HOURS) 14 mg/24hr patch Place 14 mg onto the skin daily.     nicotine  polacrilex (NICORETTE ) 2 MG gum Take 2 mg by mouth as needed for smoking cessation.     phenazopyridine (PYRIDIUM) 200 MG tablet Take 200 mg by mouth 2 (two) times daily as needed for pain.     polyethylene glycol (MIRALAX  / GLYCOLAX ) packet Take 17 g by mouth daily as needed. (Patient taking differently: Take 17 g by mouth daily. ) 14 each 0   rosuvastatin  (CRESTOR ) 10 MG tablet Take 10 mg by mouth daily.      sennosides-docusate sodium  (SENOKOT-S) 8.6-50 MG tablet Take 2 tablets by mouth at bedtime as needed for constipation.     sodium chloride  (OCEAN) 0.65 % SOLN nasal spray Place 1 spray into both nostrils 4 (four) times daily as needed (nasal dryness).     tamsulosin  (FLOMAX ) 0.4 MG CAPS capsule Take 1 capsule (0.4 mg total) by mouth daily. 30 capsule 0   No current facility-administered medications for this visit.     Past Medical History:  Diagnosis Date   Anticoagulant long-term use    eliquis    Bladder tumor    BPH (benign prostatic hyperplasia)    CKD (chronic kidney disease), stage II    Diverticulosis    Dyslipidemia    History of CVA with residual deficit 01/28/2017   acute right MCA infarc with M2 embolic occlusion w/p  tPA and mechnical thrombectomy---  residual left side weakness   History of external beam radiation therapy    03-23-2018  to 05-03-2018  prostate cancer   Hypertension    Hypertensive heart disease    Hypertrophic cardiomegaly, apical variant    Hypertrophic obstructive cardiomyopathy with diastolic heart failure [facility])    cardiologist-- dr pietro   Mitral regurgitation    Other problems related to education and literacy    can not read    Prostate cancer [facility]) urologist-  dr dahlstedt/  oncologsit-- dr patrcia   dx 07/ 2018---  Stage T1a,  Gleason 4+3--- completed external radiation therapy 05-03-2018   Stroke [facility]) 2019    Past Surgical History:  Procedure Laterality Date   aspergilloma resection     LUL, s/p resection Feb 1991 SISTER NOT SURE OF THIS   CARDIOVASCULAR STRESS TEST  11-19-2016  dr pietro   Low risk nuclear study/  normal resting and stress perfusion with no ischemia or infarction/  EF not done due to PVCIs , TID borderline, 1.2 significant LVH   CYSTOSCOPY W/ RETROGRADES Bilateral 10/31/2019   Procedure: CYSTOSCOPY WITH ATTEMPTED RETROGRADE PYELOGRAM;  Surgeon: Matilda Senior, MD;  Location: [facility];  Service: Urology;  Laterality: Bilateral;  1 HR   CYSTOSCOPY WITH BIOPSY N/A 06/16/2019   Procedure: TRANSURETHERAL RESECTION OF BLADDER TUMOR.;  Surgeon: Matilda Senior, MD;  Location: [facility];  Service: Urology;  Laterality: N/A;  30 MINS   CYSTOSCOPY WITH BIOPSY N/A 10/31/2019   Procedure: TURBT;  Surgeon: Matilda Senior, MD;  Location: [facility];  Service: Urology;  Laterality: N/A;   GOLD SEED IMPLANT N/A 03/10/2018   Procedure: GOLD SEED IMPLANT;  Surgeon: Matilda Senior, MD;  Location: [facility];  Service: Urology;  Laterality: N/A;   IR PERCUTANEOUS ART THROMBECTOMY/INFUSION INTRACRANIAL INC DIAG ANGIO  01/23/2017   RADIOLOGY WITH ANESTHESIA N/A 01/23/2017   Procedure: RADIOLOGY WITH ANESTHESIA;  Surgeon: Dolphus Carrion, MD;  Location: [facility];  Service: Radiology;  Laterality: N/A;   SPACE OAR INSTILLATION N/A 03/10/2018   Procedure: SPACE OAR INSTILLATION;  Surgeon: Matilda Senior, MD;  Location: [facility];  Service: Urology;  Laterality: N/A;   TRANSTHORACIC ECHOCARDIOGRAM  11-20-2016   dr pietro   mild LVH,  apical hypertrophic cardiomyopathy, systolic function vigorous,  ef 65-70%,  grade 2 diastolic dysfunction/  mild AR, MR, and  PR/  moderate LAE  and RAE/  mild to mod. TR/  moderate increase PASP   TRANSURETHRAL RESECTION OF BLADDER TUMOR WITH MITOMYCIN -C N/A 12/09/2018   Procedure: TRANSURETHRAL RESECTION OF BLADDER TUMOR WITH GEMCITABINE  GIVEN IN PACU;  Surgeon: Matilda Senior, MD;  Location: [facility];  Service: Urology;  Laterality: N/A;   TRANSURETHRAL RESECTION OF PROSTATE N/A 11/24/2016   Procedure: TRANSURETHRAL RESECTION OF THE PROSTATE (TURP);  Surgeon: Matilda Senior, MD;  Location: [facility];  Service: Urology;  Laterality: N/A;    Social History   Socioeconomic History   Marital status: Single    Spouse name: Not on file   Number of children: Not on file   Years of education: Not on file   Highest education level: Not on file  Occupational History    Comment: disabled  Tobacco Use   Smoking status: Some Days    Current packs/day: 0.50    Average packs/day: 0.5 packs/day for 51.0 years (25.5 ttl pk-yrs)    Types: Cigarettes   Smokeless tobacco: Never  Vaping Use   Vaping  status: Never Used  Substance and Sexual Activity   Alcohol  use: No   Drug use: No   Sexual activity: Not Currently  Other Topics Concern   Not on file  Social History Narrative   ** Merged History Encounter **      Resides with sister, Kate Daring       Social Drivers of Health   Tobacco Use: Medium Risk (02/10/2024)   Received from [facility]   Patient History    Smoking Tobacco Use: Former    Smokeless Tobacco Use: Former    Passive Exposure: Past  Programmer, Applications: Not on [employer] Insecurity: Not on file  Transportation Needs: No Transportation Needs (09/06/2021)   PRAPARE - Administrator, Civil Service (Medical): No    Lack of Transportation (Non-Medical): No  Physical Activity: Not on file  Stress: Not on file  Social Connections: Not on file  Intimate Partner Violence: Not on file  Depression (EYV7-0): Not on file  Alcohol  Screen: Not on file   Housing: Not on file  Utilities: Not on file  Health Literacy: Not on file    Family History  Problem Relation Age of Onset   Cancer Paternal Aunt        unknown    ROS: no fevers or chills, productive cough, hemoptysis, dysphasia, odynophagia, melena, hematochezia, dysuria, hematuria, rash, seizure activity, orthopnea, PND, pedal edema, claudication. Remaining systems are negative.  Physical Exam: Well-developed well-nourished in no acute distress.  Skin is warm and dry.  HEENT is normal.  Neck is supple.  Chest is clear to auscultation with normal expansion.  Cardiovascular exam is regular rate and rhythm.  Abdominal exam nontender or distended. No masses palpated. Extremities show no edema. neuro grossly intact  ECG- personally reviewed  A/P  1 apical variant hypertrophic cardiomyopathy-beta-blocker discontinued previously secondary to bradycardia.  Previously declined ICD.  Plan repeat echocardiogram.  2 hypertension-blood pressure controlled.  Continue present medical regimen.  3 paroxysmal atrial fibrillation-discontinue digoxin .  Continue apixaban .  Beta-blocker discontinued previously secondary to bradycardia.  4 hyperlipidemia-continue statin.  5 tobacco abuse-patient counseled on discontinuing.  Redell Shallow, MD    "

## 2024-05-26 ENCOUNTER — Ambulatory Visit: Payer: Medicare (Managed Care) | Admitting: Cardiology

## 2024-07-25 ENCOUNTER — Ambulatory Visit: Payer: Medicare (Managed Care) | Admitting: Nurse Practitioner
